# Patient Record
Sex: Female | Born: 1935 | Race: White | Hispanic: No | Smoking: Never smoker
Health system: Southern US, Community
[De-identification: ages and names within clinical notes are randomized; demographics above are authoritative.]

## PROBLEM LIST (undated history)

## (undated) DIAGNOSIS — E7800 Pure hypercholesterolemia, unspecified: Secondary | ICD-10-CM

## (undated) DIAGNOSIS — M1990 Unspecified osteoarthritis, unspecified site: Secondary | ICD-10-CM

## (undated) DIAGNOSIS — I4891 Unspecified atrial fibrillation: Secondary | ICD-10-CM

## (undated) DIAGNOSIS — D649 Anemia, unspecified: Secondary | ICD-10-CM

## (undated) DIAGNOSIS — I2510 Atherosclerotic heart disease of native coronary artery without angina pectoris: Secondary | ICD-10-CM

## (undated) DIAGNOSIS — L0390 Cellulitis, unspecified: Secondary | ICD-10-CM

## (undated) DIAGNOSIS — I890 Lymphedema, not elsewhere classified: Secondary | ICD-10-CM

## (undated) DIAGNOSIS — I872 Venous insufficiency (chronic) (peripheral): Secondary | ICD-10-CM

## (undated) DIAGNOSIS — E119 Type 2 diabetes mellitus without complications: Secondary | ICD-10-CM

## (undated) DIAGNOSIS — I10 Essential (primary) hypertension: Secondary | ICD-10-CM

## (undated) DIAGNOSIS — Z8673 Personal history of transient ischemic attack (TIA), and cerebral infarction without residual deficits: Secondary | ICD-10-CM

## (undated) DIAGNOSIS — C55 Malignant neoplasm of uterus, part unspecified: Secondary | ICD-10-CM

## (undated) DIAGNOSIS — I639 Cerebral infarction, unspecified: Secondary | ICD-10-CM

## (undated) HISTORY — DX: Cerebral infarction, unspecified: I63.9

## (undated) HISTORY — DX: Malignant neoplasm of uterus, part unspecified: C55

## (undated) HISTORY — DX: Unspecified atrial fibrillation: I48.91

## (undated) HISTORY — DX: Essential (primary) hypertension: I10

## (undated) HISTORY — DX: Anemia, unspecified: D64.9

## (undated) HISTORY — DX: Personal history of transient ischemic attack (TIA), and cerebral infarction without residual deficits: Z86.73

## (undated) HISTORY — DX: Type 2 diabetes mellitus without complications: E11.9

## (undated) HISTORY — DX: Cellulitis, unspecified: L03.90

## (undated) HISTORY — DX: Unspecified osteoarthritis, unspecified site: M19.90

## (undated) HISTORY — DX: Atherosclerotic heart disease of native coronary artery without angina pectoris: I25.10

## (undated) HISTORY — DX: Pure hypercholesterolemia, unspecified: E78.00

## (undated) HISTORY — PX: CATARACT EXTRACTION: SUR2

---

## 1967-02-17 HISTORY — PX: TOTAL ABDOMINAL HYSTERECTOMY W/ BILATERAL SALPINGOOPHORECTOMY: SHX83

## 1997-02-16 HISTORY — PX: CORONARY ARTERY BYPASS GRAFT: SHX141

## 1997-11-19 ENCOUNTER — Encounter: Payer: Self-pay | Admitting: Cardiothoracic Surgery

## 1997-11-19 ENCOUNTER — Inpatient Hospital Stay (HOSPITAL_COMMUNITY): Admission: RE | Admit: 1997-11-19 | Discharge: 1997-11-25 | Payer: Self-pay | Admitting: Cardiothoracic Surgery

## 1997-11-20 ENCOUNTER — Encounter: Payer: Self-pay | Admitting: Cardiothoracic Surgery

## 1997-11-21 ENCOUNTER — Encounter: Payer: Self-pay | Admitting: Cardiothoracic Surgery

## 1997-11-22 ENCOUNTER — Encounter: Payer: Self-pay | Admitting: Cardiothoracic Surgery

## 2003-03-22 ENCOUNTER — Other Ambulatory Visit: Payer: Self-pay

## 2004-09-15 ENCOUNTER — Ambulatory Visit: Payer: Self-pay | Admitting: Internal Medicine

## 2004-10-13 ENCOUNTER — Emergency Department: Payer: Self-pay | Admitting: Emergency Medicine

## 2004-10-13 ENCOUNTER — Other Ambulatory Visit: Payer: Self-pay

## 2006-10-14 ENCOUNTER — Ambulatory Visit: Payer: Self-pay | Admitting: Internal Medicine

## 2006-10-21 ENCOUNTER — Inpatient Hospital Stay: Payer: Self-pay | Admitting: Internal Medicine

## 2006-10-21 ENCOUNTER — Other Ambulatory Visit: Payer: Self-pay

## 2006-12-10 ENCOUNTER — Ambulatory Visit: Payer: Self-pay | Admitting: Internal Medicine

## 2007-05-15 ENCOUNTER — Other Ambulatory Visit: Payer: Self-pay

## 2007-05-16 ENCOUNTER — Inpatient Hospital Stay: Payer: Self-pay | Admitting: General Surgery

## 2007-05-16 ENCOUNTER — Ambulatory Visit: Payer: Self-pay | Admitting: Internal Medicine

## 2007-05-18 ENCOUNTER — Ambulatory Visit: Payer: Self-pay | Admitting: Internal Medicine

## 2007-05-21 ENCOUNTER — Ambulatory Visit: Payer: Self-pay | Admitting: Internal Medicine

## 2007-05-23 ENCOUNTER — Ambulatory Visit: Payer: Self-pay | Admitting: Internal Medicine

## 2007-05-25 ENCOUNTER — Ambulatory Visit: Payer: Self-pay | Admitting: Unknown Physician Specialty

## 2007-05-27 ENCOUNTER — Ambulatory Visit: Payer: Self-pay | Admitting: Internal Medicine

## 2007-05-30 ENCOUNTER — Ambulatory Visit: Payer: Self-pay | Admitting: Internal Medicine

## 2007-06-06 ENCOUNTER — Ambulatory Visit: Payer: Self-pay | Admitting: Internal Medicine

## 2007-06-13 ENCOUNTER — Ambulatory Visit: Payer: Self-pay | Admitting: Internal Medicine

## 2007-06-17 ENCOUNTER — Ambulatory Visit: Payer: Self-pay | Admitting: Internal Medicine

## 2007-06-27 ENCOUNTER — Ambulatory Visit: Payer: Self-pay | Admitting: Internal Medicine

## 2008-12-24 ENCOUNTER — Emergency Department: Payer: Self-pay | Admitting: Emergency Medicine

## 2011-07-07 ENCOUNTER — Ambulatory Visit: Payer: Self-pay | Admitting: Vascular Surgery

## 2011-07-07 LAB — CREATININE, SERUM
Creatinine: 0.66 mg/dL
EGFR (African American): >60
EGFR (Non-African Amer.): >60

## 2011-07-07 LAB — BUN: BUN: 14 mg/dL

## 2011-08-27 LAB — BASIC METABOLIC PANEL
Glucose: 121 mg/dL
Sodium: 132 mmol/L — AB (ref 137–147)

## 2011-08-27 LAB — TSH: TSH: 1.39 u[IU]/mL (ref 0.41–5.90)

## 2012-02-15 ENCOUNTER — Other Ambulatory Visit: Payer: Self-pay | Admitting: Internal Medicine

## 2012-02-15 MED ORDER — LISINOPRIL 40 MG PO TABS: 40.0000 mg | ORAL_TABLET | Freq: Every day | ORAL | Status: DC

## 2012-02-15 NOTE — Telephone Encounter (Signed)
Lisinopril 40 mg   Take 1 tablet one time daily  # 90

## 2012-02-15 NOTE — Telephone Encounter (Signed)
Sent in to pharmacy.  

## 2012-03-25 ENCOUNTER — Telehealth: Payer: Self-pay | Admitting: Internal Medicine

## 2012-03-25 NOTE — Telephone Encounter (Signed)
Patient called wanting labs done before her appointment on 3.18.14.

## 2012-03-25 NOTE — Telephone Encounter (Signed)
Patient already has lab appointment for 04/08/12

## 2012-03-28 NOTE — Telephone Encounter (Signed)
Called patient to let her know that when her labs is scheduled.

## 2012-04-07 ENCOUNTER — Telehealth: Payer: Self-pay | Admitting: *Deleted

## 2012-04-07 NOTE — Telephone Encounter (Signed)
Patient is coming in for labs tomorrow 2.21.2014 what labs and dx would you like? Thank you  

## 2012-04-08 ENCOUNTER — Other Ambulatory Visit (INDEPENDENT_AMBULATORY_CARE_PROVIDER_SITE_OTHER): Payer: Medicare Other

## 2012-04-08 ENCOUNTER — Other Ambulatory Visit: Payer: Self-pay | Admitting: Internal Medicine

## 2012-04-08 DIAGNOSIS — E7800 Pure hypercholesterolemia, unspecified: Secondary | ICD-10-CM

## 2012-04-08 DIAGNOSIS — I10 Essential (primary) hypertension: Secondary | ICD-10-CM

## 2012-04-08 LAB — COMPREHENSIVE METABOLIC PANEL
ALT: 25 U/L (ref 0–35)
AST: 32 U/L (ref 0–37)
Albumin: 4.4 g/dL (ref 3.5–5.2)
Alkaline Phosphatase: 76 U/L (ref 39–117)
BUN: 18 mg/dL (ref 6–23)
CO2: 31 mEq/L (ref 19–32)
Calcium: 9.8 mg/dL (ref 8.4–10.5)
Chloride: 93 mEq/L — ABNORMAL LOW (ref 96–112)
Creatinine, Ser: 0.6 mg/dL (ref 0.4–1.2)
GFR: 94.82 mL/min (ref >60.00)
Glucose, Bld: 138 mg/dL — ABNORMAL HIGH (ref 70–99)
Potassium: 4.1 mEq/L (ref 3.5–5.1)
Sodium: 132 mEq/L — ABNORMAL LOW (ref 135–145)
Total Bilirubin: 0.9 mg/dL (ref 0.3–1.2)
Total Protein: 7.2 g/dL (ref 6.0–8.3)

## 2012-04-08 LAB — LIPID PANEL
Cholesterol: 215 mg/dL — ABNORMAL HIGH (ref 0–200)
HDL: 104.7 mg/dL (ref >39.00)
Total CHOL/HDL Ratio: 2
Triglycerides: 84 mg/dL (ref 0.0–149.0)
VLDL: 16.8 mg/dL (ref 0.0–40.0)

## 2012-04-08 LAB — LDL CHOLESTEROL, DIRECT: Direct LDL: 82.4 mg/dL

## 2012-04-08 NOTE — Telephone Encounter (Signed)
I ordered a met c and liver panel.

## 2012-04-08 NOTE — Progress Notes (Signed)
Order placed for labs.

## 2012-04-11 ENCOUNTER — Other Ambulatory Visit: Payer: Self-pay | Admitting: Internal Medicine

## 2012-04-11 DIAGNOSIS — R5381 Other malaise: Secondary | ICD-10-CM

## 2012-04-11 DIAGNOSIS — E871 Hypo-osmolality and hyponatremia: Secondary | ICD-10-CM

## 2012-04-11 DIAGNOSIS — R739 Hyperglycemia, unspecified: Secondary | ICD-10-CM

## 2012-04-11 NOTE — Progress Notes (Signed)
Order placed for follow up labs 

## 2012-04-13 ENCOUNTER — Encounter: Payer: Self-pay | Admitting: *Deleted

## 2012-04-14 ENCOUNTER — Telehealth: Payer: Self-pay | Admitting: *Deleted

## 2012-04-14 NOTE — Telephone Encounter (Signed)
I would prefer earlier, but if this is the only day she can come - ok.   Will do her labs at her appt.

## 2012-04-14 NOTE — Telephone Encounter (Signed)
Patient called to see if she can have her labs done when she comes in for her appointment 3/18? Patient stated that is difficult for her to come in so she wants to comes in at the same time.

## 2012-04-14 NOTE — Telephone Encounter (Signed)
Pt aware she can get her labs done on 3/18 office visit

## 2012-04-28 ENCOUNTER — Encounter: Payer: Self-pay | Admitting: *Deleted

## 2012-05-03 ENCOUNTER — Ambulatory Visit: Payer: Self-pay | Admitting: Internal Medicine

## 2012-06-10 ENCOUNTER — Encounter: Payer: Self-pay | Admitting: Internal Medicine

## 2012-06-14 ENCOUNTER — Encounter: Payer: Self-pay | Admitting: Internal Medicine

## 2012-06-16 ENCOUNTER — Encounter: Payer: Self-pay | Admitting: Internal Medicine

## 2012-06-16 ENCOUNTER — Ambulatory Visit (INDEPENDENT_AMBULATORY_CARE_PROVIDER_SITE_OTHER): Payer: Medicare Other | Admitting: Internal Medicine

## 2012-06-16 VITALS — BP 142/80 | HR 83 | Temp 98.5°F | Ht 66.0 in | Wt 133.5 lb

## 2012-06-16 DIAGNOSIS — I10 Essential (primary) hypertension: Secondary | ICD-10-CM

## 2012-06-16 DIAGNOSIS — M79604 Pain in right leg: Secondary | ICD-10-CM

## 2012-06-16 DIAGNOSIS — F329 Major depressive disorder, single episode, unspecified: Secondary | ICD-10-CM

## 2012-06-16 DIAGNOSIS — R29898 Other symptoms and signs involving the musculoskeletal system: Secondary | ICD-10-CM

## 2012-06-16 DIAGNOSIS — I4891 Unspecified atrial fibrillation: Secondary | ICD-10-CM

## 2012-06-16 DIAGNOSIS — E119 Type 2 diabetes mellitus without complications: Secondary | ICD-10-CM

## 2012-06-16 DIAGNOSIS — I739 Peripheral vascular disease, unspecified: Secondary | ICD-10-CM

## 2012-06-16 DIAGNOSIS — E7800 Pure hypercholesterolemia, unspecified: Secondary | ICD-10-CM

## 2012-06-16 DIAGNOSIS — I2510 Atherosclerotic heart disease of native coronary artery without angina pectoris: Secondary | ICD-10-CM

## 2012-06-16 DIAGNOSIS — E871 Hypo-osmolality and hyponatremia: Secondary | ICD-10-CM

## 2012-06-16 DIAGNOSIS — M79609 Pain in unspecified limb: Secondary | ICD-10-CM

## 2012-06-16 LAB — BASIC METABOLIC PANEL
BUN: 17 mg/dL (ref 6–23)
CO2: 31 mEq/L (ref 19–32)
Calcium: 9.7 mg/dL (ref 8.4–10.5)
Chloride: 93 mEq/L — ABNORMAL LOW (ref 96–112)
Creatinine, Ser: 0.5 mg/dL (ref 0.4–1.2)
GFR: 118.27 mL/min (ref >60.00)
Glucose, Bld: 103 mg/dL — ABNORMAL HIGH (ref 70–99)
Potassium: 4.4 mEq/L (ref 3.5–5.1)
Sodium: 130 mEq/L — ABNORMAL LOW (ref 135–145)

## 2012-06-17 ENCOUNTER — Other Ambulatory Visit: Payer: Self-pay | Admitting: Internal Medicine

## 2012-06-17 ENCOUNTER — Encounter: Payer: Self-pay | Admitting: Internal Medicine

## 2012-06-17 DIAGNOSIS — E7800 Pure hypercholesterolemia, unspecified: Secondary | ICD-10-CM | POA: Insufficient documentation

## 2012-06-17 DIAGNOSIS — F32A Depression, unspecified: Secondary | ICD-10-CM | POA: Insufficient documentation

## 2012-06-17 DIAGNOSIS — I2510 Atherosclerotic heart disease of native coronary artery without angina pectoris: Secondary | ICD-10-CM | POA: Insufficient documentation

## 2012-06-17 DIAGNOSIS — I4891 Unspecified atrial fibrillation: Secondary | ICD-10-CM | POA: Insufficient documentation

## 2012-06-17 DIAGNOSIS — I739 Peripheral vascular disease, unspecified: Secondary | ICD-10-CM | POA: Insufficient documentation

## 2012-06-17 DIAGNOSIS — E119 Type 2 diabetes mellitus without complications: Secondary | ICD-10-CM | POA: Insufficient documentation

## 2012-06-17 DIAGNOSIS — E871 Hypo-osmolality and hyponatremia: Secondary | ICD-10-CM

## 2012-06-17 DIAGNOSIS — F329 Major depressive disorder, single episode, unspecified: Secondary | ICD-10-CM | POA: Insufficient documentation

## 2012-06-17 DIAGNOSIS — I10 Essential (primary) hypertension: Secondary | ICD-10-CM | POA: Insufficient documentation

## 2012-06-17 MED ORDER — HYDROCHLOROTHIAZIDE 12.5 MG PO TABS: 12.5000 mg | ORAL_TABLET | Freq: Every day | ORAL | Status: DC

## 2012-06-17 MED ORDER — SERTRALINE HCL 25 MG PO TABS: 25.0000 mg | ORAL_TABLET | Freq: Every day | ORAL | Status: DC

## 2012-06-17 MED ORDER — LISINOPRIL 40 MG PO TABS: 40.0000 mg | ORAL_TABLET | Freq: Every day | ORAL | Status: DC

## 2012-06-17 MED ORDER — METOPROLOL TARTRATE 25 MG PO TABS: ORAL_TABLET | ORAL | Status: DC

## 2012-06-17 NOTE — Progress Notes (Signed)
Orders placed for sodium recheck

## 2012-06-17 NOTE — Progress Notes (Signed)
Subjective:    Patient ID: [name], female    DOB: 05-28-1922, 79 y.o.   MRN: [id number]  HPI 79 year old female with past history of CAD s/p bypass in 10/99.  She also has a history of hypertension, hypercholesterolemia, atrial fibrillation and previous presumed TIA/CVA (2008).  Only on aspirin (per pt request).  She comes in today for a scheduled follow up.  She states she has been doing relatively well. She does report some increased hip soreness and leg soreness.  Walks with a walker.  Hard for her to get up from a seated position and getting harder for her to ambulate.  She also describes the burning/stinging sensation in her feet and lower legs.  Is s/p stent of lower extremity.  Was on plavix.  Off now.  Taking her medications regularly.  Blood pressure 150-160s/60-70s.  Breathing stable.  Bowels stable.    Past Medical History  Diagnosis Date  . Type II or unspecified type diabetes mellitus without mention of complication, not stated as uncontrolled   . Essential hypertension, benign   . Anemia, unspecified   . Atrial fibrillation   . Pure hypercholesterolemia   . Osteoarthritis   . History of TIAs   . CVA (cerebral infarction)     history  . Atrial fibrillation     history  . Uterine cancer     history    Outpatient Encounter Prescriptions as of 06/16/2012  Medication Sig Dispense Refill  . aspirin 325 MG EC tablet Take 325 mg by mouth daily.      Marland Kitchen glucose blood test strip 1 each by Other route as needed for other. Use one test strip bid      . hydrochlorothiazide (HYDRODIURIL) 12.5 MG tablet Take 12.5 mg by mouth daily.      Marland Kitchen lisinopril (PRINIVIL,ZESTRIL) 40 MG tablet Take 1 tablet (40 mg total) by mouth daily.  90 tablet  1  . metoprolol (LOPRESSOR) 50 MG tablet Take 50 mg by mouth 2 (two) times daily.      . sertraline (ZOLOFT) 25 MG tablet Take 25 mg by mouth daily.      Marland Kitchen nystatin cream (MYCOSTATIN) Apply topically 2 (two) times daily.       No  facility-administered encounter medications on file as of 06/16/2012.    Review of Systems Patient denies any headache, lightheadedness or dizziness.  No sinus or allergy symptoms.  No chest pain, tightness or palpitations.  No increased shortness of breath, cough or congestion.  No nausea or vomiting.  No abdominal pain or cramping.  No bowel change, such as diarrhea, constipation, BRBPR or melana.  No urine change.  Hip and leg pain as outlined.  Limiting her regarding her ambulation.      Objective:   Physical Exam Filed Vitals:   06/16/12 1108  BP: 142/80  Pulse: 83  Temp: 98.5 F (1.10 C)   79 year old female in no acute distress.   HEENT:  Nares- clear.  Oropharynx - without lesions. NECK:  Supple.  Nontender.  HEART:  Appears to be regular. LUNGS:  No crackles or wheezing audible.  Respirations even and unlabored.  RADIAL PULSE:  Equal bilaterally.   ABDOMEN:  Soft, nontender.  Bowel sounds present and normal.  No audible abdominal bruit.   EXTREMITIES:  Pedal edema present.  DP pulses palpable and equal bilaterally.      SKIN:  Some pedal erythema.  Appears to be more consistent with stasis changes. No evidence  of infection.  Some changes on her feet - c/w yeast.      Assessment & Plan:  LEG PAIN AND WEAKNESS.  Will have home health physical therapy evaluate and treat.  Will make a f/up appt with vascular surgery since s/p stent.  Discussed treatment of neuropathy as well.   DERM.  Will try nystatin cream on her feet.  Notify me if persistent.   HEALTH MAINTENANCE.  Last physical 06/30/11.  Has declined mammogram and further GI evaluation.  Declines bone density.   Pneumovax 12/20/06.

## 2012-06-19 ENCOUNTER — Encounter: Payer: Self-pay | Admitting: Internal Medicine

## 2012-06-19 DIAGNOSIS — E871 Hypo-osmolality and hyponatremia: Secondary | ICD-10-CM | POA: Insufficient documentation

## 2012-06-19 NOTE — Assessment & Plan Note (Signed)
Appears to be in SR today.  She declines coumadin.  On aspirin. Follow.

## 2012-06-19 NOTE — Assessment & Plan Note (Signed)
S/P stent placement.  Followed by vascular surgery.  Off Plavix.  Only on aspirin. With the leg pain, will have vascular surgery reevaluate.

## 2012-06-19 NOTE — Assessment & Plan Note (Signed)
Blood pressure elevated on my check.  Discussed adjusting her medications.  Her checks have been averaging 150-160s/60-70s.  She wants to continue to monitor.  Will send in readings over the next few weeks.  If remains elevated, will need further adjustment in her medication.

## 2012-06-19 NOTE — Assessment & Plan Note (Signed)
She declines cholesterol medication.  Check lipid panel. Low cholesterol diet.  Follow.

## 2012-06-19 NOTE — Assessment & Plan Note (Signed)
Has known disease.  Currently asymptomatic.   Feels she is doing well from a cardiac standpoint.  Follow.    

## 2012-06-19 NOTE — Assessment & Plan Note (Signed)
On zoloft and appears to be doing well.  Follow.   

## 2012-06-19 NOTE — Assessment & Plan Note (Signed)
Sodium low.  Recheck today.

## 2012-06-19 NOTE — Assessment & Plan Note (Signed)
Has controlled with diet.  Low carb diet.  Check metabolic panel and a1c.    

## 2012-06-28 ENCOUNTER — Encounter: Payer: Self-pay | Admitting: Internal Medicine

## 2012-07-07 ENCOUNTER — Telehealth: Payer: Self-pay | Admitting: *Deleted

## 2012-07-07 NOTE — Telephone Encounter (Signed)
[name] with [facility] called requesting an order for occupational therapy twice a week x 4 weeks (pt BP was 180/90)

## 2012-07-07 NOTE — Telephone Encounter (Signed)
Ok for order for occupational therapy.  Have ms Auxier relax and take bp again.  See if improved.  Gets very anxious.  She usually monitors her bp.

## 2012-07-08 NOTE — Telephone Encounter (Signed)
LMTCB for verbal order

## 2012-07-08 NOTE — Telephone Encounter (Signed)
Spoke with Amy & verbal order given

## 2012-07-26 ENCOUNTER — Encounter: Payer: Self-pay | Admitting: Internal Medicine

## 2012-07-26 ENCOUNTER — Ambulatory Visit (INDEPENDENT_AMBULATORY_CARE_PROVIDER_SITE_OTHER): Payer: Medicare Other | Admitting: Internal Medicine

## 2012-07-26 VITALS — BP 150/90 | HR 84 | Temp 98.6°F | Ht 66.0 in | Wt 134.0 lb

## 2012-07-26 DIAGNOSIS — I739 Peripheral vascular disease, unspecified: Secondary | ICD-10-CM

## 2012-07-26 DIAGNOSIS — I4891 Unspecified atrial fibrillation: Secondary | ICD-10-CM

## 2012-07-26 DIAGNOSIS — F329 Major depressive disorder, single episode, unspecified: Secondary | ICD-10-CM

## 2012-07-26 DIAGNOSIS — E871 Hypo-osmolality and hyponatremia: Secondary | ICD-10-CM

## 2012-07-26 DIAGNOSIS — R5383 Other fatigue: Secondary | ICD-10-CM

## 2012-07-26 DIAGNOSIS — I2510 Atherosclerotic heart disease of native coronary artery without angina pectoris: Secondary | ICD-10-CM

## 2012-07-26 DIAGNOSIS — I10 Essential (primary) hypertension: Secondary | ICD-10-CM

## 2012-07-26 DIAGNOSIS — R5381 Other malaise: Secondary | ICD-10-CM

## 2012-07-26 DIAGNOSIS — R7309 Other abnormal glucose: Secondary | ICD-10-CM

## 2012-07-26 DIAGNOSIS — R739 Hyperglycemia, unspecified: Secondary | ICD-10-CM

## 2012-07-26 DIAGNOSIS — E119 Type 2 diabetes mellitus without complications: Secondary | ICD-10-CM

## 2012-07-26 DIAGNOSIS — E7800 Pure hypercholesterolemia, unspecified: Secondary | ICD-10-CM

## 2012-07-27 ENCOUNTER — Telehealth: Payer: Self-pay | Admitting: *Deleted

## 2012-07-27 LAB — CBC WITH DIFFERENTIAL/PLATELET
Basophils Relative: 0.3 % (ref 0.0–3.0)
Hemoglobin: 13 g/dL (ref 12.0–15.0)
Lymphocytes Relative: 18.4 % (ref 12.0–46.0)
Monocytes Relative: 10.3 % (ref 3.0–12.0)
Neutro Abs: 6.8 10*3/uL (ref 1.4–7.7)
Neutrophils Relative %: 69.7 % (ref 43.0–77.0)
RBC: 4.32 Mil/uL (ref 3.87–5.11)
WBC: 9.8 10*3/uL (ref 4.5–10.5)

## 2012-07-27 LAB — HEMOGLOBIN A1C: Hgb A1c MFr Bld: 6.2 % (ref 4.6–6.5)

## 2012-07-27 LAB — TSH: TSH: 3.44 u[IU]/mL (ref 0.35–5.50)

## 2012-07-27 NOTE — Telephone Encounter (Signed)
Pt aware to d/c 25mg  of Metoprolol, & pt is currently on Lisinopril (I marked it as taking, but for reason it is not showing up on our notes).

## 2012-07-27 NOTE — Telephone Encounter (Signed)
Pt called to update medication list. Please review & let me know if you would like to make any changes

## 2012-07-27 NOTE — Telephone Encounter (Signed)
If this is what she is taking, then have her continue the metoprolol 50mg  bid.  Stop the 25mg  metoprolol.  She also needs to be on Lisinopril.  If she is not taking the lisinipril - needs to restart.  Follow blood pressure.

## 2012-07-28 ENCOUNTER — Encounter: Payer: Self-pay | Admitting: Internal Medicine

## 2012-07-28 ENCOUNTER — Telehealth: Payer: Self-pay | Admitting: Internal Medicine

## 2012-07-28 ENCOUNTER — Encounter: Payer: Self-pay | Admitting: *Deleted

## 2012-07-28 MED ORDER — AMLODIPINE BESYLATE 2.5 MG PO TABS: 2.5000 mg | ORAL_TABLET | Freq: Every day | ORAL | Status: DC

## 2012-07-28 NOTE — Assessment & Plan Note (Signed)
On zoloft and appears to be doing well.  Follow.   

## 2012-07-28 NOTE — Assessment & Plan Note (Signed)
Has known disease.  Currently asymptomatic.   Feels she is doing well from a cardiac standpoint.  Follow.    

## 2012-07-28 NOTE — Assessment & Plan Note (Signed)
She declines cholesterol medication.   Low cholesterol diet.  Follow.   

## 2012-07-28 NOTE — Progress Notes (Signed)
Subjective:    Patient ID: [name], female    DOB: 1922/04/05, 79 y.o.   MRN: [id number]  HPI 79 year old female with past history of CAD s/p bypass in 10/99.  She also has a history of hypertension, hypercholesterolemia, atrial fibrillation and previous presumed TIA/CVA (2008).  Only on aspirin (per pt request).  She comes in today for a scheduled follow up.  She states she has been doing relatively well.  Reports increased hip soreness and leg soreness.  Walks with a walker.  Hard for her to get up from a seated position and getting harder for her to ambulate.  Has been receiving home PT.  Has helped some.  Still with the pain.  Mainly her hip.  Desires no surgery or further intervention.  She also describes the burning/stinging sensation in her feet and lower legs.  Is s/p stent of lower extremity.  Was on plavix.  Off now.  Blood pressure 150-160s/60-70s.  Elevated.  Confused as to her current medication regimen.  Will need to clarify.  Breathing stable.  Bowels stable.    Past Medical History  Diagnosis Date  . Type II or unspecified type diabetes mellitus without mention of complication, not stated as uncontrolled   . Essential hypertension, benign   . Anemia, unspecified   . Atrial fibrillation   . Hypercholesterolemia   . Osteoarthritis   . History of TIAs   . CVA (cerebral infarction)   . Atrial fibrillation   . Uterine cancer     s/p TAH/BSO  . CAD (coronary artery disease)     s/p CABG 10/99 with LIMA to the LAD, SVG to OM1, SVG to distal RCA    Outpatient Encounter Prescriptions as of 07/26/2012  Medication Sig Dispense Refill  . glucose blood test strip 1 each by Other route as needed for other. Use one test strip bid      . lisinopril (PRINIVIL,ZESTRIL) 40 MG tablet Take 1 tablet (40 mg total) by mouth daily.  90 tablet  1  . nystatin cream (MYCOSTATIN) Apply topically 2 (two) times daily.      . sertraline (ZOLOFT) 25 MG tablet Take 1 tablet (25 mg total) by mouth  daily.  90 tablet  1  . [DISCONTINUED] aspirin 325 MG EC tablet Take 325 mg by mouth daily.      . [DISCONTINUED] hydrochlorothiazide (HYDRODIURIL) 12.5 MG tablet Take 1 tablet (12.5 mg total) by mouth daily.  90 tablet  1  . [DISCONTINUED] metoprolol tartrate (LOPRESSOR) 25 MG tablet 1/2 tablet bid  90 tablet  1   No facility-administered encounter medications on file as of 07/26/2012.    Review of Systems Patient denies any headache, lightheadedness or dizziness.  No sinus or allergy symptoms.  No chest pain, tightness or palpitations.  No increased shortness of breath, cough or congestion.  No nausea or vomiting.  No abdominal pain or cramping.  No bowel change, such as diarrhea, constipation, BRBPR or melana.  No urine change.  Hip and leg pain as outlined.  Limiting her regarding her ambulation.  Blood pressures elevated as outlined.  Request rx for a four wheeled walker.      Objective:   Physical Exam  Filed Vitals:   07/26/12 1616  BP: 150/90  Pulse: 84  Temp: 98.6 F (17 C)   79 year old female in no acute distress.   HEENT:  Nares- clear.  Oropharynx - without lesions. NECK:  Supple.  Nontender.  HEART:  Appears  to be regular. LUNGS:  No crackles or wheezing audible.  Respirations even and unlabored.  RADIAL PULSE:  Equal bilaterally.   ABDOMEN:  Soft, nontender.  Bowel sounds present and normal.  No audible abdominal bruit.   EXTREMITIES:  Pedal edema present.  DP pulses palpable and equal bilaterally.      SKIN:  Some pedal erythema.  Appears to be more consistent with stasis changes. No evidence of infection.  Some changes on her feet - c/w yeast.      Assessment & Plan:  LEG PAIN AND WEAKNESS.  Receiving home health physical therapy.  Some improvement.  Rx for walker.  Will continue home exercises.  She desires no further intervention.     DERM.  Will try lotrimin cream on her feet.  Notify me if persistent.   HEALTH MAINTENANCE.  Last physical 06/30/11.  Has  declined mammogram and further GI evaluation.  Declines bone density.   Pneumovax 12/20/06.

## 2012-07-28 NOTE — Telephone Encounter (Signed)
See lab results.  After reviewing lab results (given normal sodium) and after reviewing last phone message (taking lisinopril along with the metoprolol and HCTZ) - I need to clarify how much hctz she is taking.  Is she on 12.5mg  q day or 25 mg q day.  She had told me she was taking 12.5mg  q day.  Need to clarify her mg of hctz and if she is taking 1/2 tablet or a whole tablet.  If she is on 12.5mg  of hctz daily, I want her to increase to 25mg  q day of hctz.  Let me know.

## 2012-07-28 NOTE — Telephone Encounter (Signed)
Spoke to pt.  She states she feels fine.  Blood pressure last checked 170.  Taking her meds regularly.  Will start her on norvasc 2.5 mg q day.  Follow pressures.  Get her back in soon to reassess.  Sent rx to [facility] at pts request.

## 2012-07-28 NOTE — Assessment & Plan Note (Signed)
Has controlled with diet.  Low carb diet.  Check metabolic panel and a1c.

## 2012-07-28 NOTE — Telephone Encounter (Signed)
Schedule a follow up appt in 4-6 weeks ( ).  Thanks.

## 2012-07-28 NOTE — Telephone Encounter (Signed)
Pt is currently on Lisinopril 40mg  daily, Metoprolol 50mg  bid, & HCTZ 25mg . Pt also reports that her BP this afternoon was 192/96. When she checked it a few hours later, it came down to 170/88. Please advise

## 2012-07-28 NOTE — Assessment & Plan Note (Signed)
Recheck sodium today.  

## 2012-07-28 NOTE — Assessment & Plan Note (Signed)
S/P stent placement.  Followed by vascular surgery.  Off Plavix.  Only on aspirin.

## 2012-07-28 NOTE — Assessment & Plan Note (Signed)
Blood pressure elevated today.  I am confused as to her current medication regimen.  Unsure what she is taking.  She will call with update on meds.  Adjust meds accordingly.  Check metabolic panel.

## 2012-07-28 NOTE — Assessment & Plan Note (Signed)
Appears to be in SR today.  She declines coumadin.  On aspirin. Follow.  

## 2012-07-29 ENCOUNTER — Telehealth: Payer: Self-pay | Admitting: *Deleted

## 2012-07-29 NOTE — Telephone Encounter (Signed)
PT called to report that [name] BP was 170/100 and stays consistently high everytime she checks its. Pt told her that she has the Lisinopril 10mg to take-prn. PT advised pt to take it at her last visit when her BP was high. Pt also called to day to report that she was told to D/C the Metoprolol 25mg and when she checked her BP today it was 182/91. Please advise 

## 2012-07-29 NOTE — Telephone Encounter (Signed)
PT called to report that Ms. Sida BP was 170/100 and stays consistently high everytime she checks its. Pt told her that she has the Lisinopril 10mg  to take-prn. PT advised pt to take it at her last visit when her BP was high. Pt also called to day to report that she was told to D/C the Metoprolol 25mg  and when she checked her BP today it was 182/91. Please advise

## 2012-07-29 NOTE — Telephone Encounter (Signed)
Pt states that she has picked up the RX & PT has been informed also. Pt will keep a BP log and send it to [place] in about 2-3 weeks

## 2012-07-29 NOTE — Telephone Encounter (Signed)
I spoke to her last night about her blood pressure.  I have sent in a rx for amlodipine.  She should start this for her blood pressure.  Please make sure she starts and can let PT know.

## 2012-08-02 ENCOUNTER — Ambulatory Visit: Payer: Medicare Other | Admitting: Internal Medicine

## 2012-08-30 ENCOUNTER — Telehealth: Payer: Self-pay | Admitting: Internal Medicine

## 2012-08-30 MED ORDER — METOPROLOL TARTRATE 50 MG PO TABS: 50.0000 mg | ORAL_TABLET | Freq: Two times a day (BID) | ORAL | Status: DC

## 2012-08-30 MED ORDER — AMLODIPINE BESYLATE 2.5 MG PO TABS: 2.5000 mg | ORAL_TABLET | Freq: Every day | ORAL | Status: DC

## 2012-08-30 MED ORDER — HYDROCHLOROTHIAZIDE 25 MG PO TABS: 25.0000 mg | ORAL_TABLET | Freq: Every morning | ORAL | Status: DC

## 2012-08-30 NOTE — Telephone Encounter (Signed)
Refilled hctz 25mg  #90 with 3 refills, metoprolol 50mg  #180 with 3 refills and amlodipine 2.5mg  #90 with 3 refills.

## 2012-08-31 ENCOUNTER — Telehealth: Payer: Self-pay | Admitting: *Deleted

## 2012-08-31 ENCOUNTER — Other Ambulatory Visit: Payer: Self-pay | Admitting: *Deleted

## 2012-08-31 NOTE — Telephone Encounter (Signed)
Pt notified that Dr. Lorin Picket have reviewed her BP readings & would like her to stay on the Amlodipine 2.5mg  daily. 90 day supply was sent to [facility] along with the Metoprolol & HCTZ.   BP readings were as follows: 6/14-170/69 (68) @ 7am, 142/66 (71) @ 10:15am, 137/62 (75) @12 :10pm, 157/74 (69) @ 5:30pm 6/16-150/70 (67) 8:10am, 133/63 (54) @ 1:09pm, 159/68 (72) @ 5:12pm 6/17-149/53 (71) @7 :23am, 138/58 (63) @ 10:13am 6/19-148/64 (75)@7 :02am, 122/52 (72)@ 11:45am, 161/60 (68) @ 6pm 6/20-164/63 (70)@ 6:50am, 154/64 (71) @ 1:42pm, 161/70 (69) @ 5:35pm 6/21-148/59 (69) @ 6:09am, 129/56 (67) @ 10:45am, 134/57 @1pm , 152/55@6 :37pm 6/23-156/67@7 :13am, 139/63@11 :40am, 148/59@1 :55pm, 164/64@5 :38pm 6/25-168/56@7am , 130/56@9 :22am, 155/58@1pm , 158/73@4pm  6/26-165/58@7am ,140/59@11 :24am,151/61@2 :46pm, 177/78@4 :53pm.

## 2012-09-06 ENCOUNTER — Ambulatory Visit (INDEPENDENT_AMBULATORY_CARE_PROVIDER_SITE_OTHER): Payer: Medicare Other | Admitting: Internal Medicine

## 2012-09-06 ENCOUNTER — Encounter: Payer: Self-pay | Admitting: Internal Medicine

## 2012-09-06 VITALS — BP 150/90 | HR 93 | Temp 98.6°F | Ht 66.0 in | Wt 136.8 lb

## 2012-09-06 DIAGNOSIS — E7800 Pure hypercholesterolemia, unspecified: Secondary | ICD-10-CM

## 2012-09-06 DIAGNOSIS — E119 Type 2 diabetes mellitus without complications: Secondary | ICD-10-CM

## 2012-09-06 DIAGNOSIS — I4891 Unspecified atrial fibrillation: Secondary | ICD-10-CM

## 2012-09-06 DIAGNOSIS — F329 Major depressive disorder, single episode, unspecified: Secondary | ICD-10-CM

## 2012-09-06 DIAGNOSIS — I10 Essential (primary) hypertension: Secondary | ICD-10-CM

## 2012-09-06 DIAGNOSIS — I2510 Atherosclerotic heart disease of native coronary artery without angina pectoris: Secondary | ICD-10-CM

## 2012-09-07 ENCOUNTER — Telehealth: Payer: Self-pay | Admitting: Internal Medicine

## 2012-09-07 ENCOUNTER — Encounter: Payer: Self-pay | Admitting: Internal Medicine

## 2012-09-07 MED ORDER — LISINOPRIL 40 MG PO TABS: 40.0000 mg | ORAL_TABLET | Freq: Every day | ORAL | Status: DC

## 2012-09-07 MED ORDER — AMLODIPINE BESYLATE 5 MG PO TABS: 5.0000 mg | ORAL_TABLET | Freq: Every day | ORAL | Status: DC

## 2012-09-07 MED ORDER — SERTRALINE HCL 25 MG PO TABS: 25.0000 mg | ORAL_TABLET | Freq: Every day | ORAL | Status: DC

## 2012-09-07 NOTE — Telephone Encounter (Signed)
Needs a f/u appt in 3 months ( ).  Needs fasting labs 1-2 days before 3 month f/u.

## 2012-09-07 NOTE — Assessment & Plan Note (Signed)
Blood pressure elevated today.  On norvasc 2.5mg  q day.  Will increase norvasc to 5mg  q day.  Follow  metabolic panel and pressure.

## 2012-09-07 NOTE — Assessment & Plan Note (Signed)
Has known disease.  Currently asymptomatic.   Feels she is doing well from a cardiac standpoint.  Follow.    

## 2012-09-07 NOTE — Assessment & Plan Note (Signed)
Has controlled with diet.  Low carb diet.  Follow metabolic panel and a1c.   

## 2012-09-07 NOTE — Progress Notes (Signed)
Subjective:    Patient ID: [name], female    DOB: 09-May-1922, 79 y.o.   MRN: [id number]  HPI 79 year old female with past history of CAD s/p bypass in 10/99.  She also has a history of hypertension, hypercholesterolemia, atrial fibrillation and previous presumed TIA/CVA (2008).  Only on aspirin (per pt request).  She comes in today for a scheduled follow up.  She states she has been doing relatively well.  Reports increased hip soreness and leg soreness.  Walks with a walker.    Desires no surgery or further intervention.  She also describes the burning/stinging sensation in her feet and lower legs.  Is s/p stent of lower extremity.  Was on plavix.  Off now.  Blood pressure 150-160s/60-70s.  Elevated.   Breathing stable.  Bowels stable.  Some lower extremity swelling.     Past Medical History  Diagnosis Date  . Type II or unspecified type diabetes mellitus without mention of complication, not stated as uncontrolled   . Essential hypertension, benign   . Anemia, unspecified   . Atrial fibrillation   . Hypercholesterolemia   . Osteoarthritis   . History of TIAs   . CVA (cerebral infarction)   . Atrial fibrillation   . Uterine cancer     s/p TAH/BSO  . CAD (coronary artery disease)     s/p CABG 10/99 with LIMA to the LAD, SVG to OM1, SVG to distal RCA    Outpatient Encounter Prescriptions as of 09/06/2012  Medication Sig Dispense Refill  . aspirin 325 MG EC tablet Take 325 mg by mouth daily.      Marland Kitchen glucose blood test strip 1 each by Other route as needed for other. Use one test strip bid      . hydrochlorothiazide (HYDRODIURIL) 25 MG tablet Take 1 tablet (25 mg total) by mouth every morning.  90 tablet  3  . lisinopril (PRINIVIL,ZESTRIL) 40 MG tablet Take 1 tablet (40 mg total) by mouth daily.  90 tablet  3  . metoprolol (LOPRESSOR) 50 MG tablet Take 1 tablet (50 mg total) by mouth 2 (two) times daily.  180 tablet  3  . sertraline (ZOLOFT) 25 MG tablet Take 1 tablet (25 mg total)  by mouth daily.  90 tablet  1  . [DISCONTINUED] amLODipine (NORVASC) 2.5 MG tablet Take 1 tablet (2.5 mg total) by mouth daily.  90 tablet  3  . [DISCONTINUED] lisinopril (PRINIVIL,ZESTRIL) 40 MG tablet Take 1 tablet (40 mg total) by mouth daily.  90 tablet  1  . [DISCONTINUED] sertraline (ZOLOFT) 25 MG tablet Take 1 tablet (25 mg total) by mouth daily.  90 tablet  1  . amLODipine (NORVASC) 5 MG tablet Take 1 tablet (5 mg total) by mouth daily.  90 tablet  3  . [DISCONTINUED] aspirin 81 MG tablet Take 81 mg by mouth 2 (two) times daily.      . [DISCONTINUED] nystatin cream (MYCOSTATIN) Apply topically 2 (two) times daily.       No facility-administered encounter medications on file as of 09/06/2012.    Review of Systems Patient denies any headache, lightheadedness or dizziness.  No sinus or allergy symptoms.  No chest pain, tightness or palpitations.  No increased shortness of breath, cough or congestion.  No nausea or vomiting.  No abdominal pain or cramping.  No bowel change, such as diarrhea, constipation, BRBPR or melana.  No urine change.  Hip and leg pain as outlined.  Limiting her regarding her ambulation.  Blood pressures elevated as outlined.       Objective:   Physical Exam  Filed Vitals:   09/06/12 1611  BP: 150/90  Pulse: 93  Temp: 98.6 F (55 C)   79 year old female in no acute distress.   HEENT:  Nares- clear.  Oropharynx - without lesions. NECK:  Supple.  Nontender.  HEART:  Appears to be regular. LUNGS:  No crackles or wheezing audible.  Respirations even and unlabored.  RADIAL PULSE:  Equal bilaterally.   ABDOMEN:  Soft, nontender.  Bowel sounds present and normal.  No audible abdominal bruit.   EXTREMITIES:  Pedal edema present.  DP pulses palpable and equal bilaterally.      Assessment & Plan:  LEG PAIN AND WEAKNESS.  Received home health physical therapy.  Some improvement.  Rx for walker.  Will continue home exercises.  She desires no further  intervention.  HEALTH MAINTENANCE.  Last physical 06/30/11.  Has declined mammogram and further GI evaluation.  Declines bone density.   Need to schedule physical.

## 2012-09-07 NOTE — Assessment & Plan Note (Signed)
On zoloft and appears to be doing well.  Follow.   

## 2012-09-07 NOTE — Assessment & Plan Note (Signed)
She declines coumadin.  On aspirin. Follow.  Declines any further evaluation or treatment.

## 2012-09-08 NOTE — Telephone Encounter (Signed)
Tried calling pt  No answer 7/24

## 2012-09-12 ENCOUNTER — Other Ambulatory Visit: Payer: Self-pay | Admitting: *Deleted

## 2012-09-12 MED ORDER — AMLODIPINE BESYLATE 5 MG PO TABS: 5.0000 mg | ORAL_TABLET | Freq: Every day | ORAL | Status: DC

## 2012-09-12 NOTE — Telephone Encounter (Signed)
Pt stated she needed to talk to her daughter before she could make her appointment.  Pt stated she would call back to schedule

## 2012-09-21 ENCOUNTER — Other Ambulatory Visit: Payer: Self-pay | Admitting: *Deleted

## 2012-09-21 NOTE — Telephone Encounter (Signed)
error 

## 2012-09-28 ENCOUNTER — Encounter: Payer: Self-pay | Admitting: *Deleted

## 2012-10-07 ENCOUNTER — Telehealth: Payer: Self-pay | Admitting: Internal Medicine

## 2012-10-07 ENCOUNTER — Other Ambulatory Visit: Payer: Self-pay | Admitting: *Deleted

## 2012-10-07 MED ORDER — AMLODIPINE BESYLATE 5 MG PO TABS: 5.0000 mg | ORAL_TABLET | Freq: Every day | ORAL | Status: DC

## 2012-10-07 NOTE — Telephone Encounter (Signed)
Done (second time-sent on 7/28 also)

## 2012-10-07 NOTE — Telephone Encounter (Signed)
amLODipine (NORVASC) 5 MG tablet ° °

## 2012-12-06 ENCOUNTER — Other Ambulatory Visit (INDEPENDENT_AMBULATORY_CARE_PROVIDER_SITE_OTHER): Payer: Medicare Other

## 2012-12-06 DIAGNOSIS — E7800 Pure hypercholesterolemia, unspecified: Secondary | ICD-10-CM

## 2012-12-06 DIAGNOSIS — E119 Type 2 diabetes mellitus without complications: Secondary | ICD-10-CM

## 2012-12-06 DIAGNOSIS — E871 Hypo-osmolality and hyponatremia: Secondary | ICD-10-CM

## 2012-12-06 DIAGNOSIS — I10 Essential (primary) hypertension: Secondary | ICD-10-CM

## 2012-12-06 LAB — BASIC METABOLIC PANEL
BUN: 15 mg/dL (ref 6–23)
Creatinine, Ser: 0.5 mg/dL (ref 0.4–1.2)
GFR: 118.14 mL/min (ref >60.00)
Glucose, Bld: 119 mg/dL — ABNORMAL HIGH (ref 70–99)

## 2012-12-06 LAB — SODIUM: Sodium: 133 mEq/L — ABNORMAL LOW (ref 135–145)

## 2012-12-06 LAB — HEPATIC FUNCTION PANEL
ALT: 20 U/L (ref 0–35)
Albumin: 4.2 g/dL (ref 3.5–5.2)
Total Bilirubin: 0.9 mg/dL (ref 0.3–1.2)

## 2012-12-06 LAB — LIPID PANEL
Cholesterol: 206 mg/dL — ABNORMAL HIGH (ref 0–200)
HDL: 94.8 mg/dL (ref >39.00)
VLDL: 14.2 mg/dL (ref 0.0–40.0)

## 2012-12-06 LAB — HEMOGLOBIN A1C: Hgb A1c MFr Bld: 6.5 % (ref 4.6–6.5)

## 2012-12-06 LAB — LDL CHOLESTEROL, DIRECT: Direct LDL: 97.8 mg/dL

## 2012-12-14 ENCOUNTER — Other Ambulatory Visit: Payer: Self-pay | Admitting: *Deleted

## 2012-12-14 MED ORDER — AMLODIPINE BESYLATE 5 MG PO TABS: 5.0000 mg | ORAL_TABLET | Freq: Every day | ORAL | Status: DC

## 2012-12-15 ENCOUNTER — Ambulatory Visit (INDEPENDENT_AMBULATORY_CARE_PROVIDER_SITE_OTHER): Payer: Medicare Other | Admitting: Internal Medicine

## 2012-12-15 ENCOUNTER — Encounter: Payer: Self-pay | Admitting: Internal Medicine

## 2012-12-15 VITALS — BP 140/80 | HR 103 | Temp 97.8°F | Ht 66.0 in | Wt 139.0 lb

## 2012-12-15 DIAGNOSIS — I4891 Unspecified atrial fibrillation: Secondary | ICD-10-CM

## 2012-12-15 DIAGNOSIS — E119 Type 2 diabetes mellitus without complications: Secondary | ICD-10-CM

## 2012-12-15 DIAGNOSIS — F32A Depression, unspecified: Secondary | ICD-10-CM

## 2012-12-15 DIAGNOSIS — E871 Hypo-osmolality and hyponatremia: Secondary | ICD-10-CM

## 2012-12-15 DIAGNOSIS — I739 Peripheral vascular disease, unspecified: Secondary | ICD-10-CM

## 2012-12-15 DIAGNOSIS — F329 Major depressive disorder, single episode, unspecified: Secondary | ICD-10-CM

## 2012-12-15 DIAGNOSIS — E7800 Pure hypercholesterolemia, unspecified: Secondary | ICD-10-CM

## 2012-12-15 DIAGNOSIS — G629 Polyneuropathy, unspecified: Secondary | ICD-10-CM

## 2012-12-15 DIAGNOSIS — I2510 Atherosclerotic heart disease of native coronary artery without angina pectoris: Secondary | ICD-10-CM

## 2012-12-15 DIAGNOSIS — Z23 Encounter for immunization: Secondary | ICD-10-CM

## 2012-12-15 DIAGNOSIS — I10 Essential (primary) hypertension: Secondary | ICD-10-CM

## 2012-12-15 DIAGNOSIS — G589 Mononeuropathy, unspecified: Secondary | ICD-10-CM

## 2012-12-15 MED ORDER — GABAPENTIN 100 MG PO CAPS: ORAL_CAPSULE | ORAL | Status: DC

## 2012-12-18 ENCOUNTER — Encounter: Payer: Self-pay | Admitting: Internal Medicine

## 2012-12-18 DIAGNOSIS — G629 Polyneuropathy, unspecified: Secondary | ICD-10-CM | POA: Insufficient documentation

## 2012-12-18 NOTE — Assessment & Plan Note (Signed)
Has known disease.  Currently asymptomatic.   Feels she is doing well from a cardiac standpoint.  Follow.    

## 2012-12-18 NOTE — Progress Notes (Signed)
Subjective:    Patient ID: [name], female    DOB: 12-19-1922, 79 y.o.   MRN: [id number]  HPI 79 year old female with past history of CAD s/p bypass in 10/99.  She also has a history of hypertension, hypercholesterolemia, atrial fibrillation and previous presumed TIA/CVA (2008).  Only on aspirin (per pt request).  She comes in today for a scheduled follow up.  She states she has been doing relatively well.  Reports increased hip soreness and leg soreness.  Walks with a walker.   Desires no surgery or further intervention.  She also describes the burning/stinging sensation in her feet and lower legs.  Is s/p stent of lower extremity.  Was on plavix.  Off now.  Blood pressure better.  Blood pressures averaging 130-150s/60-70s.   Breathing stable.  Bowels stable.  Some lower extremity swelling.  Has improved.     Past Medical History  Diagnosis Date  . Type II or unspecified type diabetes mellitus without mention of complication, not stated as uncontrolled   . Essential hypertension, benign   . Anemia, unspecified   . Atrial fibrillation   . Hypercholesterolemia   . Osteoarthritis   . History of TIAs   . CVA (cerebral infarction)   . Atrial fibrillation   . Uterine cancer     s/p TAH/BSO  . CAD (coronary artery disease)     s/p CABG 10/99 with LIMA to the LAD, SVG to OM1, SVG to distal RCA    Outpatient Encounter Prescriptions as of 12/15/2012  Medication Sig  . amLODipine (NORVASC) 5 MG tablet Take 1 tablet (5 mg total) by mouth daily.  Marland Kitchen aspirin 325 MG EC tablet Take 325 mg by mouth daily.  Marland Kitchen glucose blood test strip 1 each by Other route as needed for other. Use one test strip bid  . hydrochlorothiazide (HYDRODIURIL) 25 MG tablet Take 1 tablet (25 mg total) by mouth every morning.  Marland Kitchen lisinopril (PRINIVIL,ZESTRIL) 40 MG tablet Take 1 tablet (40 mg total) by mouth daily.  . metoprolol (LOPRESSOR) 50 MG tablet Take 1 tablet (50 mg total) by mouth 2 (two) times daily.  . sertraline  (ZOLOFT) 25 MG tablet Take 1 tablet (25 mg total) by mouth daily.  Marland Kitchen gabapentin (NEURONTIN) 100 MG capsule Take one tablet before bed    Review of Systems Patient denies any headache, lightheadedness or dizziness.  No sinus or allergy symptoms.  No chest pain, tightness or palpitations.  No increased shortness of breath, cough or congestion.  No nausea or vomiting.  No abdominal pain or cramping.  No bowel change, such as diarrhea, constipation, BRBPR or melana.  No urine change.  Hip and leg pain as outlined.  Limiting her regarding her ambulation.  Blood pressures improved.  See her attached list.  Still with some burning in her feet and legs.         Objective:   Physical Exam  Filed Vitals:   12/15/12 1627  BP: 140/80  Pulse: 103  Temp: 97.8 F (60.109 C)   79 year old female in no acute distress.   HEENT:  Nares- clear.  Oropharynx - without lesions. NECK:  Supple.  Nontender.  HEART:  Appears to be regular. LUNGS:  No crackles or wheezing audible.  Respirations even and unlabored.  RADIAL PULSE:  Equal bilaterally.   ABDOMEN:  Soft, nontender.  Bowel sounds present and normal.  No audible abdominal bruit.   EXTREMITIES:  Pedal edema present.  Improved.  Assessment & Plan:  LEG PAIN AND WEAKNESS.  Received home health physical therapy.  Some improvement.  Has a walker.  Will continue home exercises.  She desires no further intervention.  HEALTH MAINTENANCE.  Last physical 06/30/11.  Has declined mammogram and further GI evaluation.  Declines bone density.   Need to schedule physical.

## 2012-12-18 NOTE — Assessment & Plan Note (Signed)
Follow sodium level.  Has been stable.   

## 2012-12-18 NOTE — Assessment & Plan Note (Signed)
Has controlled with diet.  Low carb diet.  Follow metabolic panel and a1c.   

## 2012-12-18 NOTE — Assessment & Plan Note (Signed)
On zoloft and appears to be doing well.  Follow.   

## 2012-12-18 NOTE — Assessment & Plan Note (Signed)
Blood pressure better after increasing norvasc to 5mg  q day.  Follow  metabolic panel and pressure.

## 2012-12-18 NOTE — Assessment & Plan Note (Signed)
Describes the burning in her feet and legs as outlined.  Will try gabapentin 100mg  before bed.  Titrate up as tolerated.  Discussed with her regarding possible side effects of the medication.  Follow.

## 2012-12-18 NOTE — Assessment & Plan Note (Signed)
She declines cholesterol medication.   Low cholesterol diet.  Follow.   

## 2012-12-18 NOTE — Assessment & Plan Note (Signed)
S/P stent placement.  Followed by vascular surgery.  Off Plavix.  Only on aspirin.

## 2012-12-18 NOTE — Assessment & Plan Note (Signed)
She declines coumadin.  On aspirin. Follow.  Declines any further evaluation or treatment.

## 2013-01-02 LAB — URINALYSIS, COMPLETE
Bacteria: NONE SEEN
Bilirubin,UR: NEGATIVE
Blood: NEGATIVE
Hyaline Cast: 3
Ketone: NEGATIVE
Nitrite: NEGATIVE
Protein: NEGATIVE
Specific Gravity: 1.017 (ref 1.003–1.030)
Squamous Epithelial: NONE SEEN
WBC UR: <1 /HPF (ref 0–5)

## 2013-01-02 LAB — COMPREHENSIVE METABOLIC PANEL
Alkaline Phosphatase: 107 U/L (ref 50–136)
Anion Gap: 8 (ref 7–16)
Bilirubin,Total: 0.6 mg/dL (ref 0.2–1.0)
Creatinine: 0.6 mg/dL (ref 0.60–1.30)
Glucose: 143 mg/dL — ABNORMAL HIGH (ref 65–99)
Potassium: 4.3 mmol/L (ref 3.5–5.1)
SGPT (ALT): 29 U/L (ref 12–78)
Total Protein: 7.3 g/dL (ref 6.4–8.2)

## 2013-01-02 LAB — CBC
HGB: 12.1 g/dL (ref 12.0–16.0)
MCH: 28.8 pg (ref 26.0–34.0)
MCV: 87 fL (ref 80–100)
RBC: 4.2 10*6/uL (ref 3.80–5.20)
RDW: 14.1 % (ref 11.5–14.5)
WBC: 10 10*3/uL (ref 3.6–11.0)

## 2013-01-02 LAB — PROTIME-INR: Prothrombin Time: 13 secs (ref 11.5–14.7)

## 2013-01-03 ENCOUNTER — Inpatient Hospital Stay: Payer: Self-pay | Admitting: Internal Medicine

## 2013-01-03 LAB — LIPID PANEL
Cholesterol: 182 mg/dL (ref 0–200)
Ldl Cholesterol, Calc: 75 mg/dL (ref 0–100)
Triglycerides: 66 mg/dL (ref 0–200)
VLDL Cholesterol, Calc: 13 mg/dL (ref 5–40)

## 2013-01-03 LAB — CBC WITH DIFFERENTIAL/PLATELET
Basophil %: 0.5 %
Eosinophil #: 0.1 10*3/uL (ref 0.0–0.7)
Eosinophil %: 1.1 %
HCT: 34.7 % — ABNORMAL LOW (ref 35.0–47.0)
HGB: 11.9 g/dL — ABNORMAL LOW (ref 12.0–16.0)
Lymphocyte #: 1.3 10*3/uL (ref 1.0–3.6)
MCH: 29.3 pg (ref 26.0–34.0)
MCV: 86 fL (ref 80–100)
Monocyte #: 1 x10 3/mm — ABNORMAL HIGH (ref 0.2–0.9)
Neutrophil %: 72.5 %
RBC: 4.05 10*6/uL (ref 3.80–5.20)
WBC: 9 10*3/uL (ref 3.6–11.0)

## 2013-01-03 LAB — BASIC METABOLIC PANEL
Chloride: 98 mmol/L (ref 98–107)
Co2: 29 mmol/L (ref 21–32)
EGFR (African American): >60
EGFR (Non-African Amer.): >60
Glucose: 114 mg/dL — ABNORMAL HIGH (ref 65–99)
Osmolality: 267 (ref 275–301)
Potassium: 3.7 mmol/L (ref 3.5–5.1)

## 2013-01-03 LAB — TSH: Thyroid Stimulating Horm: 4.25 u[IU]/mL

## 2013-01-04 ENCOUNTER — Telehealth: Payer: Self-pay | Admitting: Internal Medicine

## 2013-01-04 LAB — BASIC METABOLIC PANEL
BUN: 12 mg/dL (ref 7–18)
Calcium, Total: 8.8 mg/dL (ref 8.5–10.1)
Co2: 28 mmol/L (ref 21–32)
Creatinine: 0.62 mg/dL (ref 0.60–1.30)
EGFR (Non-African Amer.): >60
Osmolality: 272 (ref 275–301)
Potassium: 3.6 mmol/L (ref 3.5–5.1)

## 2013-01-04 NOTE — Telephone Encounter (Signed)
She can stop the gabapentin and go back on tylenol.  Let us know if any problems.

## 2013-01-04 NOTE — Telephone Encounter (Signed)
Daughter notified 

## 2013-01-04 NOTE — Telephone Encounter (Signed)
[facility] is calling and wanting pt to come in, in 4 weeks for hospital f/u but there are no spots open for that ???

## 2013-01-04 NOTE — Telephone Encounter (Signed)
Please call pt and confirm doing ok.  Need hospital records.  I can see her on 02/03/13 at 11:45 for hospital f/u.

## 2013-01-04 NOTE — Telephone Encounter (Signed)
Pt is doing fairly well, came home today. She was placed on 5mg  of Eloquis BID, Atorvastatin 20mg  QD, Metoprolol 25mg  BID, & Amlodipine 2.5mg  QD. Her son states that the Gabapentin makes her really sleepy (too strong). Wants to know if she can go back on her Tylenol 500mg . Please advise (Records requested)

## 2013-01-05 ENCOUNTER — Telehealth: Payer: Self-pay | Admitting: Internal Medicine

## 2013-01-05 ENCOUNTER — Telehealth: Payer: Self-pay

## 2013-01-05 NOTE — Telephone Encounter (Signed)
The patient's daughter called stating that her brother was in town and he was willing to install all of her DME equipment if a prescription could be written to day for a transfer bench ,bed side commode with arms , raised toilet seat with arms and a  motorized wheel chair .

## 2013-01-05 NOTE — Telephone Encounter (Signed)
I can write a prescription for bedside commode.  Regarding hospice, I need more information (secondary to her not meeting requirements the last visit here).  I know she had a stroke recently and was hospitalized.  Need specifics from him- so can see if can get her qualified for hospice.

## 2013-01-05 NOTE — Telephone Encounter (Signed)
Called and spoke to pts daughter Kriste Basque.  Son was there as well.  Discussed Ms Manalo current situation with her.  Explained the process to get home health and equipment for the house.  Discussed options including me calling home health to get physical therapy, nursing aid and nursing care for her.  She informed me that they just found out the hospital has arranged home health and that they should be starting tomorrow.  She was informed that we sent the rx for the bedside commode to medical supply store.  She is not aware of anything else needed at this time.  Will let me know if any problems or if need anything.  She reported her and her brother were comfortable with this plan.

## 2013-01-05 NOTE — Telephone Encounter (Signed)
Patient contacted regarding discharge from [facility] on 01/04/13.  No answer, no machine.

## 2013-01-05 NOTE — Telephone Encounter (Signed)
Pt recently had stroke.  Son, Ramon Dredge came in clinic to see about having pt put on Hospice.  States pt needs beside toilet as her walker will not fit through doorway to get to bathroom.  Alexsys Eskin (son) [phone] is here from out of town and would like go get these things set up urgently.  Please contact.  States he will be leaving town [employer] account [employer].  Copy of discharge instructions and med list made and placed in Dr. Roby Lofts box.

## 2013-01-05 NOTE — Telephone Encounter (Signed)
Please advise 

## 2013-01-05 NOTE — Telephone Encounter (Signed)
Spoke with patients daughter, she was concerned no one has called them about the equip. Please advise.

## 2013-01-05 NOTE — Telephone Encounter (Signed)
Son called back (he was not aware that I left a message on [employer]). He is aware that I am faxing the Rx for the commode to [facility]. He also mentioned that he went to the hospital & they told him that we could order home healthcare & they could get started as early as tomorrow. I informed his that they usually require a face to face encounter first. He became very upset and reminded me that she just had a stroke & could barely even walk. He didn't understand why we had to be so difficult. I informed him that I would send you the message & get back with them on the next step.

## 2013-01-05 NOTE — Telephone Encounter (Signed)
LMTCB

## 2013-01-05 NOTE — Telephone Encounter (Signed)
Pt's son came by the office and stated that he went over to [employer] and with her insurance they will cover a bed side toilet they just need an order sent over to them. [facility] phone number is [phone] and fax number is [phone].

## 2013-01-06 ENCOUNTER — Telehealth: Payer: Self-pay | Admitting: Internal Medicine

## 2013-01-06 NOTE — Telephone Encounter (Signed)
Pt.notified

## 2013-01-06 NOTE — Telephone Encounter (Signed)
Called again regarding medication for sleep.

## 2013-01-06 NOTE — Telephone Encounter (Signed)
The patient is not sleeping at night and she wants something called into the pharmacy for sleep.

## 2013-01-06 NOTE — Telephone Encounter (Signed)
Please advise 

## 2013-01-06 NOTE — Telephone Encounter (Signed)
She just had a stroke.  I would hold on sleeping pills.  She can take some tylenol before bed.

## 2013-01-06 NOTE — Telephone Encounter (Signed)
2nd attempt to contact pt regarding discharge from [facility] on 01/04/13.   "Number has been disconnected."

## 2013-01-10 ENCOUNTER — Encounter: Payer: Medicare Other | Admitting: Cardiovascular Disease

## 2013-01-16 NOTE — Telephone Encounter (Signed)
Pt got out of hospital and is in a lot of pain. Pt's daughter would like a call back to see if they could move up her appointment before the 10th of December ??? Please call Tasia Catchings [phone].

## 2013-01-17 ENCOUNTER — Encounter: Payer: Medicare Other | Admitting: Cardiovascular Disease

## 2013-01-17 NOTE — Telephone Encounter (Signed)
I am ok if she takes magnesium.  If needs rx then magoxide 400mg  q day.  Let me know if any problems.

## 2013-01-17 NOTE — Telephone Encounter (Signed)
I spoke with patient & she wants to know if she can take the Magnesium for her cramps (see message below)-pt states that her pain is mainly from her legs & associated with cramps. Please advise

## 2013-01-17 NOTE — Telephone Encounter (Signed)
Pt notified & she already has plenty of Magnesium on hand

## 2013-01-18 ENCOUNTER — Encounter: Payer: Medicare Other | Admitting: Cardiovascular Disease

## 2013-01-25 ENCOUNTER — Telehealth: Payer: Self-pay | Admitting: Internal Medicine

## 2013-01-25 NOTE — Telephone Encounter (Signed)
BP 176/68 this afternoon, so it is still high.  States fax was sent last week to Dr. Lorin Picket regarding pt BP being high.  States she received fax back from Dr. Lorin Picket yesterday to inform of pt BP.  Calling today to inform.

## 2013-01-25 NOTE — Telephone Encounter (Signed)
Please notify speech therapy that I spoke to pts daughter today 01/25/13 and discussed her medications.  She is only on amlodipine and metoprolol for her blood pressure.  She still has lisinopril at home.  Will start lisinopril 40mg  (1/2 tab) q day.  Please have them continue to monitor her blood pressure.  Also I would like for them to check met b when they go out on Friday.

## 2013-01-25 NOTE — Telephone Encounter (Signed)
FYI

## 2013-01-26 ENCOUNTER — Inpatient Hospital Stay (HOSPITAL_COMMUNITY)
Admission: EM | Admit: 2013-01-26 | Discharge: 2013-01-31 | DRG: 064 | Disposition: A | Discharge status: Skilled Nursing Facility | Payer: Medicare Other | Source: Other Acute Inpatient Hospital | Attending: Neurology | Admitting: Neurology

## 2013-01-26 ENCOUNTER — Emergency Department: Payer: Self-pay | Admitting: Emergency Medicine

## 2013-01-26 ENCOUNTER — Telehealth: Payer: Self-pay | Admitting: Internal Medicine

## 2013-01-26 DIAGNOSIS — F32A Depression, unspecified: Secondary | ICD-10-CM | POA: Diagnosis present

## 2013-01-26 DIAGNOSIS — Z7901 Long term (current) use of anticoagulants: Secondary | ICD-10-CM

## 2013-01-26 DIAGNOSIS — Z9849 Cataract extraction status, unspecified eye: Secondary | ICD-10-CM

## 2013-01-26 DIAGNOSIS — I4891 Unspecified atrial fibrillation: Secondary | ICD-10-CM | POA: Diagnosis present

## 2013-01-26 DIAGNOSIS — Z8249 Family history of ischemic heart disease and other diseases of the circulatory system: Secondary | ICD-10-CM

## 2013-01-26 DIAGNOSIS — Z951 Presence of aortocoronary bypass graft: Secondary | ICD-10-CM

## 2013-01-26 DIAGNOSIS — I2510 Atherosclerotic heart disease of native coronary artery without angina pectoris: Secondary | ICD-10-CM | POA: Diagnosis present

## 2013-01-26 DIAGNOSIS — Z8542 Personal history of malignant neoplasm of other parts of uterus: Secondary | ICD-10-CM

## 2013-01-26 DIAGNOSIS — E119 Type 2 diabetes mellitus without complications: Secondary | ICD-10-CM

## 2013-01-26 DIAGNOSIS — E1149 Type 2 diabetes mellitus with other diabetic neurological complication: Secondary | ICD-10-CM | POA: Diagnosis present

## 2013-01-26 DIAGNOSIS — E785 Hyperlipidemia, unspecified: Secondary | ICD-10-CM | POA: Diagnosis present

## 2013-01-26 DIAGNOSIS — F329 Major depressive disorder, single episode, unspecified: Secondary | ICD-10-CM | POA: Diagnosis present

## 2013-01-26 DIAGNOSIS — I619 Nontraumatic intracerebral hemorrhage, unspecified: Secondary | ICD-10-CM | POA: Diagnosis present

## 2013-01-26 DIAGNOSIS — I739 Peripheral vascular disease, unspecified: Secondary | ICD-10-CM | POA: Diagnosis present

## 2013-01-26 DIAGNOSIS — I6340 Cerebral infarction due to embolism of unspecified cerebral artery: Principal | ICD-10-CM | POA: Diagnosis present

## 2013-01-26 DIAGNOSIS — Z8673 Personal history of transient ischemic attack (TIA), and cerebral infarction without residual deficits: Secondary | ICD-10-CM

## 2013-01-26 DIAGNOSIS — I10 Essential (primary) hypertension: Secondary | ICD-10-CM

## 2013-01-26 DIAGNOSIS — Z823 Family history of stroke: Secondary | ICD-10-CM

## 2013-01-26 DIAGNOSIS — H53469 Homonymous bilateral field defects, unspecified side: Secondary | ICD-10-CM | POA: Diagnosis present

## 2013-01-26 DIAGNOSIS — E7800 Pure hypercholesterolemia, unspecified: Secondary | ICD-10-CM | POA: Diagnosis present

## 2013-01-26 DIAGNOSIS — E1142 Type 2 diabetes mellitus with diabetic polyneuropathy: Secondary | ICD-10-CM | POA: Diagnosis present

## 2013-01-26 DIAGNOSIS — Z79899 Other long term (current) drug therapy: Secondary | ICD-10-CM

## 2013-01-26 DIAGNOSIS — Z90710 Acquired absence of both cervix and uterus: Secondary | ICD-10-CM

## 2013-01-26 DIAGNOSIS — F3289 Other specified depressive episodes: Secondary | ICD-10-CM | POA: Diagnosis present

## 2013-01-26 LAB — BASIC METABOLIC PANEL
Calcium, Total: 9.6 mg/dL (ref 8.5–10.1)
Creatinine: 0.66 mg/dL (ref 0.60–1.30)
EGFR (African American): >60
EGFR (Non-African Amer.): >60
Osmolality: 271 (ref 275–301)
Sodium: 135 mmol/L — ABNORMAL LOW (ref 136–145)

## 2013-01-26 LAB — URINALYSIS, COMPLETE
Bacteria: NONE SEEN
Ketone: NEGATIVE
Leukocyte Esterase: NEGATIVE
Nitrite: NEGATIVE
Ph: 7 (ref 4.5–8.0)
RBC,UR: <1 /HPF (ref 0–5)
Specific Gravity: 1.013 (ref 1.003–1.030)
Squamous Epithelial: <1
WBC UR: 1 /HPF (ref 0–5)

## 2013-01-26 LAB — CBC
HCT: 39.2 % (ref 35.0–47.0)
MCHC: 33.1 g/dL (ref 32.0–36.0)
MCV: 85 fL (ref 80–100)
Platelet: 197 10*3/uL (ref 150–440)
RBC: 4.61 10*6/uL (ref 3.80–5.20)
RDW: 13.9 % (ref 11.5–14.5)
WBC: 8.8 10*3/uL (ref 3.6–11.0)

## 2013-01-26 LAB — APTT: aPTT: 33 seconds (ref 24–37)

## 2013-01-26 LAB — PROTIME-INR
INR: 1.12 (ref 0.00–1.49)
Prothrombin Time: 14.5 secs (ref 11.5–14.7)
Prothrombin Time: 14.2 seconds (ref 11.6–15.2)

## 2013-01-26 LAB — MRSA PCR SCREENING: MRSA by PCR: NEGATIVE

## 2013-01-26 LAB — TROPONIN I: Troponin-I: <0.02 ng/mL

## 2013-01-26 MED ORDER — PANTOPRAZOLE SODIUM 40 MG IV SOLR
40.0000 mg | Freq: Every day | INTRAVENOUS | Status: DC
  Administered 2013-01-26 – 2013-01-27 (×2): 40 mg via INTRAVENOUS
  Filled 2013-01-26 (×4): qty 40

## 2013-01-26 MED ORDER — LABETALOL HCL 5 MG/ML IV SOLN
10.0000 mg | INTRAVENOUS | Status: DC | PRN
  Administered 2013-01-26 – 2013-01-28 (×2): 10 mg via INTRAVENOUS
  Filled 2013-01-26 (×2): qty 4

## 2013-01-26 MED ORDER — ACETAMINOPHEN 325 MG PO TABS
650.0000 mg | ORAL_TABLET | ORAL | Status: DC | PRN
  Administered 2013-01-26 – 2013-01-31 (×15): 650 mg via ORAL
  Filled 2013-01-26 (×15): qty 2

## 2013-01-26 MED ORDER — ACETAMINOPHEN 650 MG RE SUPP: 650.0000 mg | RECTAL | Status: DC | PRN

## 2013-01-26 MED ORDER — SODIUM CHLORIDE 0.9 % IV SOLN
INTRAVENOUS | Status: DC
  Administered 2013-01-27: 14:00:00 via INTRAVENOUS

## 2013-01-26 MED ORDER — SENNOSIDES-DOCUSATE SODIUM 8.6-50 MG PO TABS
1.0000 | ORAL_TABLET | Freq: Two times a day (BID) | ORAL | Status: DC
  Administered 2013-01-26 – 2013-01-31 (×7): 1 via ORAL
  Filled 2013-01-26 (×10): qty 1

## 2013-01-26 NOTE — Progress Notes (Signed)
[facility]  Patient Status: Active (receiving services up to time of hospitalization)  [facility] is providing the following services: RN, PT and OT  If patient discharges after hours, please call [phone].   Jodene Nam 01/26/2013, 3:39 PM

## 2013-01-26 NOTE — Telephone Encounter (Signed)
Per Dr. [name] was transported to [facility] (in-patient)

## 2013-01-26 NOTE — Telephone Encounter (Signed)
Called to confirm pt was taken to ED. No answer on pt's home number, no answering machine, unable to leave message regarding calling office regarding followup. FYI to Dr. Lorin Picket

## 2013-01-26 NOTE — Telephone Encounter (Signed)
Please call ER later and confirm pt has arrived.

## 2013-01-26 NOTE — Progress Notes (Signed)
UR completed.  Zan Triska, RN BSN [facility] CCM Trauma/Neuro ICU Case Manager [phone]  

## 2013-01-26 NOTE — Telephone Encounter (Signed)
Pt was seen in ER & discharged (records requested)

## 2013-01-26 NOTE — H&P (Signed)
Admission H&P    Chief Complaint: New-onset visual changes and unstable gait.  HPI: [name] is an 79 y.o. female  with a history of diabetes mellitus, hypertension, hyperlipidemia, atrial fibrillation on anticoagulation, and recent stroke, who presented to the emergency room at [facility] earlier today with new onset gait instability and visual changes. She also complaining of ringing in the ears. Symptoms were present when she woke up this morning. She was last known well at about 8:30 last night. She has been on apixaban and also on aspirin. CT scan of her head showed evidence of acute stroke involving the left occipital region with hemorrhagic involvement. There was no mass effect. She was subsequently transferred to [facility] for further management. NIH stroke score was 3.  LSN: 8:30 PM on 01/26/1999 410 tPA Given: No: ICH; acute stroke one month ago; on anticoagulation with apixaban. MRankin: 1  Past Medical History  Diagnosis Date  . Type II or unspecified type diabetes mellitus without mention of complication, not stated as uncontrolled   . Essential hypertension, benign   . Anemia, unspecified   . Atrial fibrillation   . Hypercholesterolemia   . Osteoarthritis   . History of TIAs   . CVA (cerebral infarction)   . Atrial fibrillation   . Uterine cancer     s/p TAH/BSO  . CAD (coronary artery disease)     s/p CABG 10/99 with LIMA to the LAD, SVG to OM1, SVG to distal RCA    Past Surgical History  Procedure Laterality Date  . Total abdominal hysterectomy w/ bilateral salpingoophorectomy  1969  . Coronary artery bypass graft  1999    x4  . Cataract extraction      Right eye    Family History  Problem Relation Age of Onset  . Heart disease Mother 65    Died of heart attack  . Stroke Father 63    died of cva  . CAD Brother     multiple brothers   Social History:  reports that she has never smoked. She has never used smokeless tobacco. She reports  that she does not drink alcohol. Her drug history is not on file.  Allergies:  Allergies  Allergen Reactions  . Erythromycin Ethylsuccinate   . Mycinette [Phenol]     Medications Prior to Admission  Medication Sig Dispense Refill  . amLODipine (NORVASC) 5 MG tablet Take 1 tablet (5 mg total) by mouth daily.  90 tablet  1  . aspirin 325 MG EC tablet Take 325 mg by mouth daily.      Marland Kitchen gabapentin (NEURONTIN) 100 MG capsule Take one tablet before bed  30 capsule  1  . glucose blood test strip 1 each by Other route as needed for other. Use one test strip bid      . hydrochlorothiazide (HYDRODIURIL) 25 MG tablet Take 1 tablet (25 mg total) by mouth every morning.  90 tablet  3  . lisinopril (PRINIVIL,ZESTRIL) 40 MG tablet Take 1 tablet (40 mg total) by mouth daily.  90 tablet  3  . metoprolol (LOPRESSOR) 50 MG tablet Take 1 tablet (50 mg total) by mouth 2 (two) times daily.  180 tablet  3  . sertraline (ZOLOFT) 25 MG tablet Take 1 tablet (25 mg total) by mouth daily.  90 tablet  1    ROS: History obtained from the patient  General ROS: negative for - chills, fatigue, fever, night sweats, weight gain or weight loss Psychological ROS: negative  for - behavioral disorder, hallucinations, memory difficulties, mood swings or suicidal ideation Ophthalmic ROS: negative for - blurry vision, double vision, eye pain or loss of vision ENT ROS: negative for - epistaxis, nasal discharge, oral lesions, sore throat, tinnitus or vertigo Allergy and Immunology ROS: negative for - hives or itchy/watery eyes Hematological and Lymphatic ROS: negative for - bleeding problems, bruising or swollen lymph nodes Endocrine ROS: negative for - galactorrhea, hair pattern changes, polydipsia/polyuria or temperature intolerance Respiratory ROS: negative for - cough, hemoptysis, shortness of breath or wheezing Cardiovascular ROS: negative for - chest pain, dyspnea on exertion, edema or irregular heartbeat Gastrointestinal  ROS: negative for - abdominal pain, diarrhea, hematemesis, nausea/vomiting or stool incontinence Genito-Urinary ROS: negative for - dysuria, hematuria, incontinence or urinary frequency/urgency Musculoskeletal ROS: negative for - joint swelling or muscular weakness Neurological ROS: as noted in HPI Dermatological ROS: negative for rash and skin lesion changes  Physical Examination: Blood pressure 137/67, pulse 92, temperature 97.5 F (36.4 C), temperature source Oral, resp. rate 21, SpO2 100.00%.  HEENT-  Normocephalic, no lesions, without obvious abnormality.  Normal external eye and conjunctiva.  Normal TM's bilaterally.  Normal auditory canals and external ears. Normal external nose, mucus membranes and septum.  Normal pharynx. Neck supple with no masses, nodes, nodules or enlargement. Cardiovascular - irregularly irregular rhythm, S1, S2 normal and systolic murmur: systolic ejection 3/6, crescendo throughout the precordium Lungs - chest clear, no wheezing, rales, normal symmetric air entry, Heart exam - S1, S2 normal, no murmur, no gallop, rate regular Abdomen - soft, non-tender; bowel sounds normal; no masses,  no organomegaly Extremities - no joint deformities, effusion, or inflammation and no skin discoloration  Neurologic Examination: Mental Status: Alert, oriented, thought content appropriate.  Speech fluent without evidence of aphasia. Able to follow commands without difficulty. Cranial Nerves: II-dense right homonymous hemianopsia. III/IV/VI-Pupils were equal and reacted. Extraocular movements were full and conjugate.    V/VII-slight right facial numbness; no facial weakness. VIII-normal. X-normal speech and symmetrical palatal movement. Motor: 5/5 bilaterally with normal tone and bulk Sensory: Reduced perception of tactile sensation over the right lower extremity compared to left lower extremity. Deep Tendon Reflexes: 1+ and symmetric. Plantars: Mute bilaterally Cerebellar:  Normal finger-to-nose testing. Carotid auscultation: Normal  No results found for this or any previous visit (from the past 48 hour(s)). No results found.  Assessment: 79 y.o. female with multiple risk factors for stroke including atrial fibrillation on anticoagulation, as well as diabetes mellitus, hypertension and hyperlipidemia, and recent left cerebral infarction, presenting with new left occipital infarction with hemorrhagic involvement. No mass effect noted.  Stroke Risk Factors - atrial fibrillation, diabetes mellitus, family history, hyperlipidemia and hypertension  Plan: 1. Stat protime and APTT; anticoagulation reversal measures if high-normal or supratherapeutic. 2. MRI, MRA  of the brain without contrast 3. PT consult, OT consult 4. HgbA1c, fasting lipid panel 5. Carotid dopplers 6. Prophylactic therapy-None 7. Risk factor modification 8. Telemetry monitoring  C.R. Roseanne Reno, MD [facility] [phone]  01/26/2013, 2:36 PM

## 2013-01-26 NOTE — Telephone Encounter (Signed)
Tried to contact patient-no answer or voicemail. Will try again later

## 2013-01-26 NOTE — Telephone Encounter (Signed)
Patient Information:  Caller Name: [name]  Phone: [phone]  Patient: [name]  Gender: Female  DOB: 1922/03/17  Age: 79 Years  PCP: Dale [place]  Office Follow Up:  Does the office need to follow up with this patient?: No  Instructions For The Office: N/A   Symptoms  Reason For Call & Symptoms: Daughter [name] calling about High Blood Pressure.  BP 187/78 at 07:00 and 190/74 more recently.  Suden loss of hearing upon waking at 06:30.  She also has new weakness.  History of recent CVA.  Call EMS 911 Now due to Sounds like a life-theratening emergency to the triager.  Caller agreed.  Reviewed Health History In EMR: Yes  Reviewed Medications In EMR: Yes  Reviewed Allergies In EMR: Yes  Reviewed Surgeries / Procedures: Yes  Date of Onset of Symptoms: 01/26/2013  Treatments Tried: Gave BP medications  Treatments Tried Worked: No  Guideline(s) Used:  Neurologic Deficit  Disposition Per Guideline:   Call EMS 911 Now  Reason For Disposition Reached:   Sounds like a life-threatening emergency to the triager  Advice Given:  N/A  Patient Will Follow Care Advice:  YES

## 2013-01-26 NOTE — Telephone Encounter (Signed)
Noted.  Please call pt later or tomorrow and confirm doing ok.  Thanks.

## 2013-01-27 ENCOUNTER — Encounter (HOSPITAL_COMMUNITY): Payer: Self-pay | Admitting: General Practice

## 2013-01-27 DIAGNOSIS — I4891 Unspecified atrial fibrillation: Secondary | ICD-10-CM

## 2013-01-27 DIAGNOSIS — I619 Nontraumatic intracerebral hemorrhage, unspecified: Secondary | ICD-10-CM

## 2013-01-27 NOTE — Evaluation (Signed)
Speech Language Pathology Evaluation Patient Details Name: [name] MRN: [id number] DOB: Jun 23, 1922 Today's Date: 01/27/2013 Time: 4098-1191 SLP Time Calculation (min): 18 min  Problem List:  Patient Active Problem List   Diagnosis Date Noted  . ICH (intracerebral hemorrhage) 01/26/2013  . Neuropathy 12/18/2012  . Hyponatremia 06/19/2012  . Atrial fibrillation 06/17/2012  . Essential hypertension, benign 06/17/2012  . CAD (coronary artery disease) 06/17/2012  . Peripheral vascular disease 06/17/2012  . Diabetes 06/17/2012  . Hypercholesterolemia 06/17/2012  . Depression 06/17/2012   Past Medical History:  Past Medical History  Diagnosis Date  . Type II or unspecified type diabetes mellitus without mention of complication, not stated as uncontrolled   . Essential hypertension, benign   . Anemia, unspecified   . Atrial fibrillation   . Hypercholesterolemia   . Osteoarthritis   . History of TIAs   . CVA (cerebral infarction)   . Atrial fibrillation   . Uterine cancer     s/p TAH/BSO  . CAD (coronary artery disease)     s/p CABG 10/99 with LIMA to the LAD, SVG to OM1, SVG to distal RCA   Past Surgical History:  Past Surgical History  Procedure Laterality Date  . Total abdominal hysterectomy w/ bilateral salpingoophorectomy  1969  . Coronary artery bypass graft  1999    x4  . Cataract extraction      Right eye   HPI:  [name] is an 79 y.o. female  with a history of diabetes mellitus, hypertension, hyperlipidemia, atrial fibrillation on anticoagulation, and recent stroke, who presented to the emergency room at [facility] earlier today with new onset gait instability and visual changes. She also complaining of ringing in the ears. Symptoms were present when she woke up this morning. She was last known well at about 8:30 last night. She has been on apixaban and also on aspirin. CT scan of her head showed evidence of acute stroke involving the  left occipital region with hemorrhagic involvement. There was no mass effect. She was subsequently transferred to [facility] for further management. NIH stroke score was 3.   Assessment / Plan / Recommendation Clinical Impression  Pt presents with mild cognitive impairment, likely form both age and prior CVA. Pt had assist from dtr at home for complex tasks. She does verbalize and demonstrate new right field visual deficits which complicate functional problem solving. SLP provided compensatory strategies for this and memory. Pt reports she had just started home health SLP prior to new CVA. She tells PT she would like f/u at [facility]. No acute needs, defer f/u to SNF level.     SLP Assessment  All further Speech Lanaguage Pathology  needs can be addressed in the next venue of care    Follow Up Recommendations  SNF SLP    Frequency and Duration        Pertinent Vitals/Pain NA   SLP Goals     SLP Evaluation Prior Functioning  Cognitive/Linguistic Baseline: Baseline deficits Baseline deficit details: memory high leve word finding.  Type of Home: House  Lives With: Daughter Available Help at Discharge: Family;Available PRN/intermittently Education: limited education per pt Vocation: Retired   IT consultant  Overall Cognitive Status: Within Functional Limits for tasks assessed Arousal/Alertness: Awake/alert Orientation Level: Oriented X4 Attention: Focused;Sustained;Selective;Alternating Focused Attention: Appears intact Sustained Attention: Appears intact Selective Attention: Appears intact Alternating Attention: Appears intact Memory: Impaired Memory Impairment: Decreased recall of new information Awareness: Appears intact Problem Solving: Impaired Problem Solving Impairment: Functional complex;Verbal complex (  requires extra time, min cues) Executive Function: Self Correcting;Self Monitoring;Initiating Initiating: Appears intact Self Monitoring: Appears intact Self Correcting: Appears  intact Safety/Judgment: Appears intact    Comprehension  Auditory Comprehension Overall Auditory Comprehension: Impaired (needs repetition of complex informatio) Yes/No Questions: Within Functional Limits Commands: Within Functional Limits Conversation: Complex (need repetition and redirection with coplex info) Interfering Components: Visual impairments;Hearing;Processing speed;Working memory EffectiveTechniques: Extra processing time;Repetition;Slowed speech Visual Recognition/Discrimination Discrimination: Within Function Limits Reading Comprehension Reading Status: Within funtional limits    Expression Verbal Expression Overall Verbal Expression: Impaired at baseline (Pt reports word finding trouble from first CVA, high level) Initiation: No impairment Automatic Speech: Name;Social Response Level of Generative/Spontaneous Verbalization: Conversation Repetition: No impairment Naming: No impairment Pragmatics: No impairment   Oral / Motor Oral Motor/Sensory Function Overall Oral Motor/Sensory Function: Appears within functional limits for tasks assessed Motor Speech Overall Motor Speech: Appears within functional limits for tasks assessed   GO     [name], [name] 01/27/2013, 11:32 AM

## 2013-01-27 NOTE — Progress Notes (Signed)
Occupational Therapy Evaluation Patient Details Name: [name] MRN: [id number] DOB: October 23, 1922 Today's Date: 01/27/2013 Time: 1410-1444 OT Time Calculation (min): 34 min  OT Assessment / Plan / Recommendation History of present illness pt presents with gait difficulties and visual changes and recent hx of CVA.     Clinical Impression  CT scan of her head showed evidence of acute stroke involving the left occipital region with hemorrhagic involvement  PTA, pt lived with daughter and was mod I with mobility and required S with ADL. Pt was at home alone while daughter worked, although daughter came home @ lunch everyday to check on her.  Pt presents with apparent R homonymous hemianopsia, and requires increased assistance with ADL and mobility. Family states that they prefer pt be D/C home. Feel pt is an excellent CIR consult, however, will need 24/7 S after D/C. Pt will benefit from skilled OT services to facilitate D/C to next venue due to below deficits.    OT Assessment  Patient needs continued OT Services    Follow Up Recommendations  CIR    Barriers to Discharge      Equipment Recommendations  Tub/shower bench    Recommendations for Other Services Rehab consult  Frequency  Min 3X/week    Precautions / Restrictions Precautions Precautions: Fall Restrictions Weight Bearing Restrictions: No   Pertinent Vitals/Pain BP 167/59 HR 89 O2 97 RA    ADL  Eating/Feeding: Minimal assistance (vc to ID food in R field) Grooming: Minimal assistance Where Assessed - Grooming: Supported sitting Upper Body Bathing: Minimal assistance Where Assessed - Upper Body Bathing: Supported sitting Lower Body Bathing: Moderate assistance Where Assessed - Lower Body Bathing: Supported sit to stand Upper Body Dressing: Moderate assistance Where Assessed - Upper Body Dressing: Supported sit to stand Lower Body Dressing: Moderate assistance Where Assessed - Lower Body Dressing: Supported sit  to stand Toilet Transfer: Minimal assistance;Simulated Statistician Method: Other (comment) (ambulating) Acupuncturist: Other (comment) (bed - chair) Toileting - Clothing Manipulation and Hygiene: Minimal assistance Equipment Used: Rolling walker;Gait belt Transfers/Ambulation Related to ADLs: min A. mod vc for positioning in RW ADL Comments: functional decline    OT Diagnosis: Generalized weakness;Disturbance of vision;Cognitive deficits  OT Problem List: Decreased strength;Decreased activity tolerance;Impaired vision/perception;Decreased cognition;Decreased safety awareness;Cardiopulmonary status limiting activity OT Treatment Interventions: Self-care/ADL training;Therapeutic exercise;Neuromuscular education;DME and/or AE instruction;Therapeutic activities;Cognitive remediation/compensation;Visual/perceptual remediation/compensation;Patient/family education;Balance training   OT Goals(Current goals can be found in the care plan section) Acute Rehab OT Goals Patient Stated Goal: to do more for myself OT Goal Formulation: With patient Time For Goal Achievement: 02/10/13 Potential to Achieve Goals: Good  Visit Information  Last OT Received On: 01/27/13 Assistance Needed: +1 History of Present Illness: pt presents with gait difficulties and visual changes and recent hx of CVA.         Prior Functioning     Home Living Family/patient expects to be discharged to:: Private residence Living Arrangements: Children (daughter) Available Help at Discharge: Family;Available PRN/intermittently Type of Home: House Home Access: Stairs to enter [employer] of Steps: 1 (and 1) Entrance Stairs-Rails: None Home Layout: One level Home Equipment: Walker - 4 wheels;Shower seat (rollator)  Lives With: Daughter Prior Function Level of Independence: Independent with assistive device(s) Comments: prior CVA 3 weeks ago Communication Communication: No  difficulties Dominant Hand: Right         Vision/Perception Vision - History Baseline Vision: Wears glasses only for reading Patient Visual Report: Other (comment) (difficulty seeing things in far right gaze)  Vision - Assessment Eye Alignment: Within Functional Limits Vision Assessment: Vision tested Ocular Range of Motion: Restricted on the right;Impaired-to be further tested in functional context Alignment/Gaze Preference: Within Defined Limits Tracking/Visual Pursuits: Decreased smoothness of horizontal tracking Saccades: Undershoots;Additional head turns occurred during testing;Decreased speed of saccadic movement Convergence: Within functional limits Visual Fields: Right homonymous hemianopsia Perception Perception: Within Functional Limits Praxis Praxis: Intact   Cognition  Cognition Arousal/Alertness: Awake/alert Behavior During Therapy: WFL for tasks assessed/performed Overall Cognitive Status: Impaired/Different from baseline Area of Impairment: Memory;Awareness Memory: Decreased short-term memory (memory deficits from previous stroke)    Extremity/Trunk Assessment Upper Extremity Assessment Upper Extremity Assessment: Generalized weakness Lower Extremity Assessment Lower Extremity Assessment: Generalized weakness Cervical / Trunk Assessment Cervical / Trunk Assessment: Normal     Mobility Bed Mobility Bed Mobility: Supine to Sit;Sitting - Scoot to Edge of Bed Supine to Sit: 4: Min assist;HOB elevated Sitting - Scoot to Edge of Bed: 3: Mod assist Details for Bed Mobility Assistance: cues for sequencing and utilized pad to A with scooting to EOB.   Transfers Sit to Stand: 4: Min assist;With upper extremity assist;From bed Stand to Sit: 4: Min assist;With upper extremity assist;To bed;To chair/3-in-1 Details for Transfer Assistance: pt demos good use of UEs.  pt with audible creaking in joints during transfers and pt indicates hx of arthritis.       Exercise      Balance Balance Balance Assessed: Yes Static Sitting Balance Static Sitting - Balance Support: No upper extremity supported;Feet supported Static Sitting - Level of Assistance: 5: Stand by assistance High Level Balance High Level Balance Activites: Direction changes High Level Balance Comments: min A. poor positioning inside RW. Running into objects on R   End of Session OT - End of Session Equipment Utilized During Treatment: Rolling walker Activity Tolerance: Patient tolerated treatment well Patient left: in chair;with call bell/phone within reach Nurse Communication: Mobility status  GO     Theone Bowell,HILLARY 01/27/2013, 3:11 PM [facility], OTR/L  [phone] 01/27/2013

## 2013-01-27 NOTE — Evaluation (Signed)
Physical Therapy Evaluation Patient Details Name: [name] MRN: [id number] DOB: May 26, 1922 Today's Date: 01/27/2013 Time: 4098-1191 PT Time Calculation (min): 24 min  PT Assessment / Plan / Recommendation History of Present Illness  pt presents with gait difficulties and visual changes and recent hx of CVA.    Clinical Impression  Pt agreeable to OOB.  Pt generally weak and debilitated.  Pt states that her daughter works during the day and that she does not have anyone that can be with her 24hrs.  Pt requests going to a facility in the [place] area so that she can be close to home.      PT Assessment  Patient needs continued PT services    Follow Up Recommendations  SNF    Does the patient have the potential to tolerate intense rehabilitation      Barriers to Discharge Decreased caregiver support pt's daughter works during the day.      Equipment Recommendations  None recommended by PT    Recommendations for Other Services     Frequency Min 4X/week    Precautions / Restrictions Precautions Precautions: Fall Restrictions Weight Bearing Restrictions: No   Pertinent Vitals/Pain Pt indicates arthritis pain in bil knees and hips.        Mobility  Bed Mobility Bed Mobility: Supine to Sit;Sitting - Scoot to Edge of Bed Supine to Sit: 4: Min assist;HOB elevated Sitting - Scoot to Edge of Bed: 3: Mod assist Details for Bed Mobility Assistance: cues for sequencing and utilized pad to A with scooting to EOB.   Transfers Transfers: Sit to Stand;Stand to [employer] Transfers Sit to Stand: 4: Min assist;With upper extremity assist;From bed Stand to Sit: 4: Min assist;With upper extremity assist;To bed;To chair/3-in-1 Stand Pivot Transfers: 4: Min assist Details for Transfer Assistance: pt demos good use of UEs.  pt with audible creaking in joints during transfers and pt indicates hx of arthritis.   Ambulation/Gait Ambulation/Gait Assistance: 1: +2 Total  assist Ambulation/Gait: Patient Percentage: 60% Ambulation Distance (Feet): 10 Feet Assistive device: 2 person hand held assist Ambulation/Gait Assistance Details: pt unsteady and with shuffled gait and fatigues quickly.   Gait Pattern: Step-through pattern;Decreased stride length;Shuffle;Trunk flexed Stairs: No Wheelchair Mobility Wheelchair Mobility: No Modified Rankin (Stroke Patients Only) Pre-Morbid Rankin Score: Slight disability Modified Rankin: Moderately severe disability    Exercises     PT Diagnosis: Difficulty walking;Generalized weakness  PT Problem List: Decreased strength;Decreased activity tolerance;Decreased balance;Decreased mobility;Decreased knowledge of use of DME;Pain PT Treatment Interventions: DME instruction;Gait training;Stair training;Functional mobility training;Therapeutic activities;Therapeutic exercise;Balance training;Neuromuscular re-education;Patient/family education     PT Goals(Current goals can be found in the care plan section) Acute Rehab PT Goals Patient Stated Goal: Feel stronger PT Goal Formulation: With patient Time For Goal Achievement: 02/10/13 Potential to Achieve Goals: Good  Visit Information  Last PT Received On: 01/27/13 Assistance Needed: +1 History of Present Illness: pt presents with gait difficulties and visual changes and recent hx of CVA.         Prior Functioning  Home Living Family/patient expects to be discharged to:: Private residence Living Arrangements: Children (daughter) Available Help at Discharge: Family;Available PRN/intermittently Type of Home: House Home Access: Stairs to enter [employer] of Steps: 1 (and 1) Entrance Stairs-Rails: None Home Layout: One level Home Equipment: Walker - 4 wheels;Shower seat (rollator) Prior Function Level of Independence: Independent with assistive device(s) Communication Communication: No difficulties    Cognition  Cognition Arousal/Alertness:  Awake/alert Behavior During Therapy: WFL for tasks assessed/performed Overall  Cognitive Status: Within Functional Limits for tasks assessed    Extremity/Trunk Assessment Upper Extremity Assessment Upper Extremity Assessment: Defer to OT evaluation Lower Extremity Assessment Lower Extremity Assessment: Generalized weakness   Balance Balance Balance Assessed: Yes Static Sitting Balance Static Sitting - Balance Support: No upper extremity supported;Feet supported Static Sitting - Level of Assistance: 5: Stand by assistance  End of Session PT - End of Session Equipment Utilized During Treatment: Gait belt Activity Tolerance: Patient limited by fatigue;Patient limited by pain Patient left: in chair;with call bell/phone within reach Nurse Communication: Mobility status  GP     Sunny Schlein, [place] [phone] 01/27/2013, 10:18 AM

## 2013-01-27 NOTE — Progress Notes (Signed)
Rehab Admissions Coordinator Note:  Patient was screened by Trish Mage for appropriateness for an Inpatient Acute Rehab Consult.  At this time, we are recommending Inpatient Rehab consult.  Trish Mage 01/27/2013, 4:29 PM  I can be reached at [phone].

## 2013-01-27 NOTE — Progress Notes (Addendum)
Stroke Team Progress Note  HISTORY  [name] is an 79 y.o. female with history significant for diabetes mellitus, hypertension, hyperlipidemia, atrial fibrillation on anticoagulation, and recent stroke Nov 2014 at outside facility whereby she is currently receiving Home Health PT/OT and Speech Therapy. She presented to [facility] ED with new onset gait instability, visual changes, and  ringing in the ears when she woke up this yesterday (01/25/2013). She was last known well at about 8:30 the previous night. She was on apixaban and aspirin at the time. CT scan of her head showed evidence of acute stroke involving the left occipital region with hemorrhagic involvement. There was no mass effect. She was subsequently transferred to [facility] for further management. NIH stroke score was 3.  Patient was not a TPA candidate secondary to recent stroke on apixiban. She was admitted for further evaluation and treatment. Review of outside imagining shows likely Left PCA embolic event thus has delayed hemorrhagic transformation as she is now several weeks post acute cerebral infacrtion.  SUBJECTIVE No family at the bedside.  Overall she feels her condition is unchanged but has difficulty articulating and recalling events.  OBJECTIVE Most recent Vital Signs: Filed Vitals:   01/27/13 0500 01/27/13 0600 01/27/13 0700 01/27/13 0800  BP: 158/52 146/66 162/55 162/53  Pulse: 60 76 56 45  Temp:   98.2 F (36.8 C)   TempSrc:   Oral   Resp: 14 21 15 21   SpO2: 100% 100% 99% 96%    IV Fluid Intake:   . sodium chloride 75 mL/hr at 01/27/13 0800    MEDICATIONS  . pantoprazole (PROTONIX) IV  40 mg Intravenous QHS  . senna-docusate  1 tablet Oral BID   PRN:  acetaminophen, acetaminophen, labetalol  Diet:  Cardiac thin liquids Activity:  Up with assistance DVT Prophylaxis:  SCDs  CLINICALLY SIGNIFICANT STUDIES BMET    Component Value Date/Time   NA 133* 12/06/2012 0814   NA 133*  12/06/2012 0814   NA 132* 08/27/2011   K 4.4 12/06/2012 0814   CL 93* 12/06/2012 0814   CO2 31 12/06/2012 0814   GLUCOSE 119* 12/06/2012 0814   BUN 15 12/06/2012 0814   BUN 26* 08/27/2011   CREATININE 0.5 12/06/2012 0814   CREATININE 0.7 08/27/2011   CALCIUM 9.6 12/06/2012 0814    CBC    Component Value Date/Time   WBC 9.8 07/26/2012 1713   RBC 4.32 07/26/2012 1713   HGB 13.0 07/26/2012 1713   HCT 38.7 07/26/2012 1713   PLT 260.0 07/26/2012 1713   MCV 89.6 07/26/2012 1713   MCHC 33.7 07/26/2012 1713   RDW 14.3 07/26/2012 1713   LYMPHSABS 1.8 07/26/2012 1713   MONOABS 1.0 07/26/2012 1713   EOSABS 0.1 07/26/2012 1713   BASOSABS 0.0 07/26/2012 1713     Coagulation:  Recent Labs Lab 01/26/13 1545  LABPROT 14.2  INR 1.12   Lipid Panel    Component Value Date/Time   CHOL 206* 12/06/2012 0814   TRIG 71.0 12/06/2012 0814   HDL 94.80 12/06/2012 0814   CHOLHDL 2 12/06/2012 0814   VLDL 14.2 12/06/2012 0814   HgbA1C  Lab Results  Component Value Date   HGBA1C 6.5 12/06/2012    CT of the brain    MRI of the brain    MRA of the brain    2D Echocardiogram    Carotid Doppler    CXR    EKG  Pending   Therapy Recommendations SNF  Physical Exam  General: Well-developed, well-nourished, elderly White female, in no acute distress; Head: Normocephalic, atraumatic. Eyes: PERRLA, EOMI Lungs: Normal respiratory effort Heart: IRRR Abdomen: benign Extremities: No pretibial edema, distal pulses intact Neurologic: Mental Status: Alert, oriented, thought content appropriate.  Speech fluent without evidence of aphasia. Diminished recall 0/3. Able to name only 8 animals  Able to follow 3 step commands without difficulty, +word finding difficulties, likely mild baseline cognitive impairment Cranial Nerves: II: right homonymous hemianopia, pupils equal, round, reactive to light and accommodation III,IV, VI: ptosis not present, extra-ocular motions intact bilaterally V,VII: smile  symmetric VIII: grossly hearing normal bilaterally with c/o ringing XII: midline tongue extension without atrophy or fasciculations  Motor: Right : Upper extremity   4/5    Left:     Upper extremity   4/5  Lower extremity   4/5     Lower extremity   4/5 Tone and bulk:normal tone throughout; no atrophy noted Sensory: gross light touch intact throughout, bilaterally Cerebellar: normal finger-to-nose  ASSESSMENT Ms. [name] is a 79 y.o. female presenting with gait instability, visual changes, and ear ringing. Recent CVA Nov 2014.  Outside CT Imaging confirms a left occipital lobe infarct with hemorrhagic transformation. Infarct felt to be embolic secondary to atrial fibrillation.  On apixiban prior to admission. Patient with resultant right visual field defect.   Recent embolic left PCA infarct in November 2014 now with hemorrhagic transformation  Atrial fibrillation on apixaban (held)  Hypertension  PVD  Diabetes mellitus with neuropathy  Depression  Hospital day # 1  TREATMENT/PLAN  Discontinue apixiban. Maintain tight control of blood pressure with systolic blood pressure goal below 180.  Repeat Ct scan tomorrow 01/28/2013 if isodense hemorrhage findings consider aspirin therapy at that time for secondary stroke prevention. May resume Apixaban at a lower dose 2.5 twice a day in 7-10 days when CT findings of acute blood go away  CTA imaging  Done at [facility] available on PACS system hence will not repeat  PT/OT recommend SNF, pt lives with daughter who works during the day  Need [facility], pt had complete stroke work-up within the past 30 days    01/27/2013 8:35 AM This patient is critically ill and at significant risk of neurological worsening, death and care requires constant monitoring of vital signs, hemodynamics,respiratory and cardiac monitoring,review of multiple databases, neurological assessment, discussion with family, other specialists and medical decision making  of high complexity. I spent 30 minutes of neurocritical care time  in the care of  this patient. I have personally obtained a history, examined the patient, evaluated imaging results, and formulated the assessment and plan of care. I agree with the above. Delia Heady, MD

## 2013-01-28 ENCOUNTER — Inpatient Hospital Stay (HOSPITAL_COMMUNITY): Payer: Medicare Other

## 2013-01-28 LAB — BASIC METABOLIC PANEL
CO2: 30 mEq/L (ref 19–32)
Calcium: 9.4 mg/dL (ref 8.4–10.5)
Chloride: 101 mEq/L (ref 96–112)
GFR calc Af Amer: >90 mL/min (ref >90)
GFR calc non Af Amer: 78 mL/min — ABNORMAL LOW (ref >90)
Glucose, Bld: 117 mg/dL — ABNORMAL HIGH (ref 70–99)
Potassium: 3.7 mEq/L (ref 3.5–5.1)
Sodium: 138 mEq/L (ref 135–145)

## 2013-01-28 MED ORDER — METOPROLOL TARTRATE 1 MG/ML IV SOLN
5.0000 mg | Freq: Four times a day (QID) | INTRAVENOUS | Status: DC | PRN
Start: 2013-01-28 — End: 2013-01-29

## 2013-01-28 MED ORDER — SERTRALINE HCL 25 MG PO TABS
25.0000 mg | ORAL_TABLET | Freq: Every day | ORAL | Status: DC
  Administered 2013-01-28 – 2013-01-31 (×4): 25 mg via ORAL
  Filled 2013-01-28 (×4): qty 1

## 2013-01-28 MED ORDER — PANTOPRAZOLE SODIUM 40 MG PO TBEC
40.0000 mg | DELAYED_RELEASE_TABLET | Freq: Every day | ORAL | Status: DC
  Administered 2013-01-28 – 2013-01-31 (×4): 40 mg via ORAL
  Filled 2013-01-28 (×4): qty 1

## 2013-01-28 MED ORDER — AMLODIPINE BESYLATE 5 MG PO TABS
5.0000 mg | ORAL_TABLET | Freq: Every day | ORAL | Status: DC
  Administered 2013-01-28 – 2013-01-31 (×4): 5 mg via ORAL
  Filled 2013-01-28 (×4): qty 1

## 2013-01-28 MED ORDER — LISINOPRIL 40 MG PO TABS
40.0000 mg | ORAL_TABLET | Freq: Every day | ORAL | Status: DC
  Filled 2013-01-28: qty 1

## 2013-01-28 MED ORDER — METOPROLOL TARTRATE 50 MG PO TABS
50.0000 mg | ORAL_TABLET | Freq: Two times a day (BID) | ORAL | Status: DC
  Filled 2013-01-28 (×2): qty 1

## 2013-01-28 MED ORDER — METOPROLOL TARTRATE 25 MG PO TABS
25.0000 mg | ORAL_TABLET | Freq: Two times a day (BID) | ORAL | Status: DC
  Administered 2013-01-28 – 2013-01-31 (×7): 25 mg via ORAL
  Filled 2013-01-28 (×9): qty 1

## 2013-01-28 MED ORDER — HYDROCHLOROTHIAZIDE 25 MG PO TABS
25.0000 mg | ORAL_TABLET | Freq: Every morning | ORAL | Status: DC
  Administered 2013-01-28: 25 mg via ORAL
  Filled 2013-01-28: qty 1

## 2013-01-28 NOTE — Progress Notes (Addendum)
Clinical Social Work Department CLINICAL SOCIAL WORK PLACEMENT NOTE 01/28/2013  Patient:  [name], [name]  Account Number:  [id number] Admit date:  01/26/2013  Clinical Social Worker:  Samuella Bruin, Theresia Majors  Date/time:  01/28/2013 12:51 PM  Clinical Social Work is seeking post-discharge placement for this patient at the following level of care:   SKILLED NURSING   (*CSW will update this form in Epic as items are completed)   01/28/2013  Patient/family provided with [facility] Department of Clinical Social Work's list of facilities offering this level of care within the geographic area requested by the patient (or if unable, by the patient's family).  01/28/2013  Patient/family informed of their freedom to choose among providers that offer the needed level of care, that participate in Medicare, Medicaid or managed care program needed by the patient, have an available bed and are willing to accept the patient.    Patient/family informed of [facility]' ownership interest in [facility], as well as of the fact that they are under no obligation to receive care at this facility.  PASARR submitted to EDS on 01/28/2013 PASARR number received from EDS on 01/28/2013 - Clinical Social Work Department CLINICAL SOCIAL WORK PLACEMENT NOTE 01/28/2013  Patient:  [name], [name]  Account Number:  [id number] Admit date:  01/26/2013  Clinical Social Worker:  Samuella Bruin, Theresia Majors  Date/time:  01/28/2013 12:51 PM  Clinical Social Work is seeking post-discharge placement for this patient at the following level of care:   SKILLED NURSING   (*CSW will update this form in Epic as items are completed)   01/28/2013  Patient/family provided with [facility] Department of Clinical Social Work's list of facilities offering this level of care within the geographic area requested by the patient (or if unable, by the patient's family).  01/28/2013  Patient/family informed of their freedom  to choose among providers that offer the needed level of care, that participate in Medicare, Medicaid or managed care program needed by the patient, have an available bed and are willing to accept the patient.    Patient/family informed of [facility]' ownership interest in [facility], as well as of the fact that they are under no obligation to receive care at this facility.  PASARR submitted to EDS on 01/28/2013 PASARR number received from EDS on 01/28/2013 - Clinical Social Work Department CLINICAL SOCIAL WORK PLACEMENT NOTE 01/28/2013  Patient:  [name], [name]  Account Number:  [id number] Admit date:  01/26/2013  Clinical Social Worker:  Samuella Bruin, Theresia Majors  Date/time:  01/28/2013 12:51 PM  Clinical Social Work is seeking post-discharge placement for this patient at the following level of care:   SKILLED NURSING   (*CSW will update this form in Epic as items are completed)   01/28/2013  Patient/family provided with [facility] Department of Clinical Social Work's list of facilities offering this level of care within the geographic area requested by the patient (or if unable, by the patient's family).  01/28/2013  Patient/family informed of their freedom to choose among providers that offer the needed level of care, that participate in Medicare, Medicaid or managed care program needed by the patient, have an available bed and are willing to accept the patient.    Patient/family informed of [facility]' ownership interest in [facility], as well as of the fact that they are under no obligation to receive care at this facility.  PASARR submitted to EDS on 01/28/2013 PASARR number received from EDS on 01/28/2013 - [id number]  FL2 transmitted to all facilities in geographic area requested by pt/family on  01/28/2013 FL2 transmitted to all facilities within larger geographic area on   Patient informed that his/her managed care company has contracts with or will  negotiate with  certain facilities, including the following:     Patient/family informed of bed offers received:   Patient chooses bed at   Physician recommends and patient chooses bed at    Patient to be transferred to  on   Patient to be transferred to facility by   The following physician request were entered in Epic:   Additional Comments:   FL2 transmitted to all facilities in geographic area requested by pt/family on  01/28/2013 FL2 transmitted to all facilities within larger geographic area on   Patient informed that his/her managed care company has contracts with or will negotiate with  certain facilities, including the following:     Patient/family informed of bed offers received:   Patient chooses bed at  Physician recommends and patient chooses bed at    Patient to be transferred to  on   Patient to be transferred to facility by   The following physician request were entered in Epic:   Additional Comments:   FL2 transmitted to all facilities in geographic area requested by pt/family on  01/28/2013 FL2 transmitted to all facilities within larger geographic area on   Patient informed that his/her managed care company has contracts with or will negotiate with  certain facilities, including the following:     Patient/family informed of bed offers received:   Patient chooses bed at [facility] 01/31/2013) Physician recommends and patient chooses bed at    Patient to be transferred to [facility], SNF Ernestine Mcmurray, LCSWA 01/31/13) on  01/31/2013 Patient to be transferred to facility by   The following physician request were entered in Epic:  Additional Comments:  Samuella Bruin, MSW, LCSWA Clinical Social Worker [facility] Emergency Dept. [phone]

## 2013-01-28 NOTE — Progress Notes (Signed)
Stroke Team Progress Note  HISTORY  [name] is an 79 y.o. female with history significant for diabetes mellitus, hypertension, hyperlipidemia, atrial fibrillation on anticoagulation, and recent stroke Nov 2014 at outside facility whereby she is currently receiving Home Health PT/OT and Speech Therapy. She presented to [facility] ED with new onset gait instability, visual changes, and  ringing in the ears when she woke up this yesterday (01/25/2013). She was last known well at about 8:30 the previous night. She was on apixaban and aspirin at the time. CT scan of her head showed evidence of acute stroke involving the left occipital region with hemorrhagic involvement. There was no mass effect. She was subsequently transferred to [facility] for further management. NIH stroke score was 3.  Patient was not a TPA candidate secondary to recent stroke on apixiban. She was admitted for further evaluation and treatment. Review of outside imagining shows likely Left PCA embolic event thus has delayed hemorrhagic transformation as she is now several weeks post acute cerebral infacrtion.  SUBJECTIVE Son at bedside. Patient sitting in chair. No complaints. No headache, nausea, vomiting. Comfortable.   OBJECTIVE Most recent Vital Signs: Filed Vitals:   01/28/13 0400 01/28/13 0500 01/28/13 0600 01/28/13 0700  BP: 150/51 147/60 152/68 153/67  Pulse: 100 97 82 76  Temp: 98.3 F (36.8 C)     TempSrc: Oral     Resp: 18 19 16 16   Height:      Weight:      SpO2: 95% 96% 98% 96%    IV Fluid Intake:   . sodium chloride Stopped (01/27/13 2000)    MEDICATIONS  . amLODipine  5 mg Oral Daily  . hydrochlorothiazide  25 mg Oral q morning - 10a  . lisinopril  40 mg Oral Daily  . metoprolol  50 mg Oral BID  . pantoprazole (PROTONIX) IV  40 mg Intravenous QHS  . senna-docusate  1 tablet Oral BID  . sertraline  25 mg Oral Daily   PRN:  acetaminophen, acetaminophen, labetalol  Diet:   Cardiac thin liquids Activity:  Up with assistance DVT Prophylaxis:  SCDs  CLINICALLY SIGNIFICANT STUDIES BMET    Component Value Date/Time   NA 138 01/28/2013 0530   NA 132* 08/27/2011   K 3.7 01/28/2013 0530   CL 101 01/28/2013 0530   CO2 30 01/28/2013 0530   GLUCOSE 117* 01/28/2013 0530   BUN 13 01/28/2013 0530   BUN 26* 08/27/2011   CREATININE 0.62 01/28/2013 0530   CREATININE 0.7 08/27/2011   CALCIUM 9.4 01/28/2013 0530   GFRNONAA 78* 01/28/2013 0530   GFRAA >90 01/28/2013 0530    CBC    Component Value Date/Time   WBC 9.8 07/26/2012 1713   RBC 4.32 07/26/2012 1713   HGB 13.0 07/26/2012 1713   HCT 38.7 07/26/2012 1713   PLT 260.0 07/26/2012 1713   MCV 89.6 07/26/2012 1713   MCHC 33.7 07/26/2012 1713   RDW 14.3 07/26/2012 1713   LYMPHSABS 1.8 07/26/2012 1713   MONOABS 1.0 07/26/2012 1713   EOSABS 0.1 07/26/2012 1713   BASOSABS 0.0 07/26/2012 1713     Coagulation:   Recent Labs Lab 01/26/13 1545  LABPROT 14.2  INR 1.12   Lipid Panel    Component Value Date/Time   CHOL 206* 12/06/2012 0814   TRIG 71.0 12/06/2012 0814   HDL 94.80 12/06/2012 0814   CHOLHDL 2 12/06/2012 0814   VLDL 14.2 12/06/2012 0814   HgbA1C  Lab Results  Component Value Date  HGBA1C 6.5 12/06/2012    CT of the brain    MRI of the brain    MRA of the brain    2D Echocardiogram    Carotid Doppler    CXR    EKG  Pending   Therapy Recommendations SNF  GENERAL EXAM: Patient is in no distress; well developed, nourished and groomed; neck is supple  CARDIOVASCULAR: IRREG RATE AND RHYTHM; MILD SYSTOLIC MURMUR; no carotid bruits  NEUROLOGIC: MENTAL STATUS: awake, alert, oriented to person, normal attention and concentration, language fluent, comprehension intact, naming intact, fund of knowledge appropriate; DIFFICULTY WITH READING; SPELLS WORDS OUT LETTER BY LETTER, THEN SAY THE WORD.  CRANIAL NERVE: pupils equal and reactive to light, RIGHT HOMONYMOUS HEMIANOPSIA. Extraocular  muscles intact, no nystagmus, facial sensation and strength symmetric, hearing intact, palate elevates symmetrically, uvula midline, shoulder shrug symmetric, tongue midline. MOTOR: normal bulk and tone, full strength in the BUE, LLE; RIGHT LEG LIMITED BY RIGHT KNEE PAIN (4/5 PROX, 5/5 DISTAL). SENSORY: normal and symmetric to light touch COORDINATION: finger-nose-finger normal GAIT/STATION: SITTING IN CHAIR, STABLE.    ASSESSMENT Ms. Larina Lieurance is a 79 y.o. female presenting with gait instability, visual changes, and ear ringing. Recent CVA Nov 2014.  Outside CT Imaging confirms a left occipital lobe infarct with hemorrhagic transformation. Infarct felt to be embolic secondary to atrial fibrillation. On apixiban prior to admission. Patient with resultant right visual field defect.   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 Maintain tight control of blood pressure with systolic blood pressure goal below 180.  Repeat CT head today (01/28/13); if isodense hemorrhage findings consider aspirin therapy at that time for secondary stroke prevention. May resume Apixaban at a lower dose 2.5 twice a day in 7-10 days when CT findings of acute blood go away  CTA imaging done at [facility] available on PACS system, hence will not repeat; patient had complete stroke work-up at [facility] within the past 30 days  PT/OT recommend SNF, pt lives with daughter who works during the day  I reviewed images, labs, notes, records myself. I summarized findings and reviewed with patient, for this high risk condition (left PCA ischemic infarct secondary to atrial fibrillation, with subsequent hemorrhagic transformation) requiring high complexity decision making.    Suanne Marker, MD 01/28/2013, 10:16  AM Certified in Neurology, Neurophysiology and Neuroimaging [facility] - Stroke Team  Please refer to [email or web address] for on-call Stroke MD

## 2013-01-28 NOTE — Progress Notes (Signed)
Clinical Social Work Department BRIEF PSYCHOSOCIAL ASSESSMENT 01/28/2013  Patient:  [name], [name]     Account Number:  [id number]     Admit date:  01/26/2013  Clinical Social Worker:  Hadley Pen  Date/Time:  01/28/2013 11:53 AM  Referred by:  CSW  Date Referred:  01/27/2013 Referred for  SNF Placement   Other Referral:   Interview type:  Patient Other interview type:   Weekend CSW also spoke to patient's son [name] (C: [phone] H: [phone]) who was at the bedside.    PSYCHOSOCIAL DATA Living Status:  FAMILY Admitted from facility:   Level of care:   Primary support name:  [name] & [name] Primary support relationship to patient:  CHILD, ADULT Degree of support available:   Strong;  Contact information: [name] (C: [id number] H: [phone]) ; [name] ([phone] H: [phone])    CURRENT CONCERNS Current Concerns  Post-Acute Placement   Other Concerns:    SOCIAL WORK ASSESSMENT / PLAN Weekend CSW received referral from weekday CSW that patient may need SNF placement in [place]. Weekend CSW met with patient and her son Kara who was at the bedside and explained her role. Patient and son were agreeable to speak. CSW informed patient that PT and OT are recommending intensive physical therapy following discharge and informed her of her options of CIR (possibly), SNF, and home health. Patient states that she wants to return home. Patient currently has home health PT & OT, as well as a home health RN on a weekly basis through [facility]. Per son and patient, patient has been able to ambulate throughout the home with the assistance of a walker. Falls do not seem to be an issue at this time. Patient uses walker, bedside commode, and a "life alert" alarm at home. Patient lives with her daughter [name] who works during the day but checks in with her mother at lunchtime everyday. Patient's grandchildren and nieces are also a source of  support. Son Kara inquired about personal care aides- CSW informed him that patient's primary insurance may not cover those services and would therefore be private pay. Son is interested in looking into this option, as well as seeing if patient's home health services could be "bumped up" to a few more days a week. Patient made it clear that she would like to return home, but was agreeable to a SNF search in [place] as a backup plan.   Assessment/plan status:  Information/Referral to [facility] Other assessment/ plan:   Information/referral to community resources:   Weekend CSW provided patient and son with a SNF list and will fax out to [facility] [employer] SNF's as a backup.    PATIENT'S/FAMILY'S RESPONSE TO PLAN OF CARE: Patient and son Kara were engaged in conversation regarding discharge plans. Son and patient would prefer for patient to return home with her daughter [name], with extra support from relatives and possibly a personal care aide. Patient and son Kara were agreeable to [employer] [employer] SNF search as a backup. Weekend CSW attempted to contact daughter [name] via telephone to update, but was not able to speak with her at this time.     Samuella Bruin, MSW, LCSWA Clinical Social Worker [facility] Emergency Dept. [phone]

## 2013-01-28 NOTE — Care Management Note (Unsigned)
    Page 1 of 1   01/28/2013     2:35:55 PM   CARE MANAGEMENT NOTE 01/28/2013  Patient:  RAYCHELL, HOLCOMB   Account Number:  [id number]  Date Initiated:  01/28/2013  Documentation initiated by:  GRAVES-BIGELOW,Ebrima Ranta  Subjective/Objective Assessment:   Pt admitted for New-onset visual changes and unstable gait. CSW called CM in ref to [facility] services. CSW stated pt is refusing SNF and wants [facility] services with [facility].     Action/Plan:   CM did try to speak to pt, however she was in a procedure. CM did provide Staff RN a PCS list to give to pt once she returns.  CIR has been consulted. CM will continue to monitor.   Anticipated DC Date:  01/30/2013   Anticipated DC Plan:  HOME W HOME HEALTH SERVICES  In-house referral  Clinical Social Worker      DC Planning Services  CM consult      Choice offered to / List presented to:             Status of service:  In process, will continue to follow Medicare Important Message given?   (If response is "NO", the following Medicare IM given date fields will be blank) Date Medicare IM given:   Date Additional Medicare IM given:    Discharge Disposition:    Per UR Regulation:    If discussed at Long Length of Stay Meetings, dates discussed:    Comments:

## 2013-01-28 NOTE — Progress Notes (Signed)
Patient experienced a 6 beat run of V. Tach. Blood pressure is stable along with other vitals, patient arousable. No new changes. Dr. Amada Jupiter called and made aware. BMP ordered. Will continue to monitor. Jacqulynn Cadet

## 2013-01-28 NOTE — Progress Notes (Signed)
Weekend CSW contacted weekend Case Manager Steward Drone to update her about patient wanting to return home with [facility] and increase services if possible. CSW also informed CM that patient and family would like a listing of personal care agencies in [place].   Samuella Bruin, MSW, LCSWA Clinical Social Worker [facility] Emergency Dept. [phone]

## 2013-01-29 DIAGNOSIS — I619 Nontraumatic intracerebral hemorrhage, unspecified: Secondary | ICD-10-CM

## 2013-01-29 DIAGNOSIS — I4891 Unspecified atrial fibrillation: Secondary | ICD-10-CM

## 2013-01-29 DIAGNOSIS — I739 Peripheral vascular disease, unspecified: Secondary | ICD-10-CM

## 2013-01-29 MED ORDER — HYDRALAZINE HCL 20 MG/ML IJ SOLN: 5.0000 mg | Freq: Three times a day (TID) | INTRAMUSCULAR | Status: DC | PRN

## 2013-01-29 MED ORDER — LISINOPRIL 20 MG PO TABS
20.0000 mg | ORAL_TABLET | Freq: Every day | ORAL | Status: DC
  Administered 2013-01-29 – 2013-01-31 (×3): 20 mg via ORAL
  Filled 2013-01-29 (×3): qty 1

## 2013-01-29 NOTE — Progress Notes (Addendum)
Stroke Team Progress Note  HISTORY  [name] is an 79 y.o. female with history significant for diabetes mellitus, hypertension, hyperlipidemia, atrial fibrillation on anticoagulation, and recent stroke Nov 2014 at outside facility whereby she is currently receiving Home Health PT/OT and Speech Therapy. She presented to [facility] ED with new onset gait instability, visual changes, and ringing in the ears when she woke up this yesterday (01/25/2013). She was last known well at about 8:30 the previous night. She was on apixaban and aspirin at the time. CT scan of her head showed evidence of acute stroke involving the left occipital region with hemorrhagic involvement. There was no mass effect. She was subsequently transferred to [facility] for further management. NIH stroke score was 3. Patient was not a TPA candidate secondary to recent stroke on apixiban. She was admitted for further evaluation and treatment. Review of outside imaging shows left PCA embolic event thus has delayed hemorrhagic transformation as she is now several weeks post acute cerebral infarction.  SUBJECTIVE Son at bedside. Patient sitting in chair. No complaints. No headache, nausea, vomiting. Comfortable. BP has been labile.   OBJECTIVE Most recent Vital Signs: Filed Vitals:   01/29/13 0423 01/29/13 0720 01/29/13 1030 01/29/13 1145  BP: 148/75 168/57 163/66 152/76  Pulse: 61 63 76 78  Temp: 97.5 F (36.4 C) 98.1 F (36.7 C)  98 F (36.7 C)  TempSrc: Oral Oral  Oral  Resp: 15 26 19 19   Height:      Weight:      SpO2: 98% 96% 100% 99%    IV Fluid Intake:      MEDICATIONS  . amLODipine  5 mg Oral Daily  . lisinopril  20 mg Oral Daily  . metoprolol tartrate  25 mg Oral BID  . pantoprazole  40 mg Oral Daily  . senna-docusate  1 tablet Oral BID  . sertraline  25 mg Oral Daily   PRN:  acetaminophen, acetaminophen  Diet:  Cardiac thin liquids Activity:  Up with assistance DVT Prophylaxis:   SCDs  CLINICALLY SIGNIFICANT STUDIES BMET    Component Value Date/Time   NA 138 01/28/2013 0530   NA 132* 08/27/2011   K 3.7 01/28/2013 0530   CL 101 01/28/2013 0530   CO2 30 01/28/2013 0530   GLUCOSE 117* 01/28/2013 0530   BUN 13 01/28/2013 0530   BUN 26* 08/27/2011   CREATININE 0.62 01/28/2013 0530   CREATININE 0.7 08/27/2011   CALCIUM 9.4 01/28/2013 0530   GFRNONAA 78* 01/28/2013 0530   GFRAA >90 01/28/2013 0530    CBC    Component Value Date/Time   WBC 9.8 07/26/2012 1713   RBC 4.32 07/26/2012 1713   HGB 13.0 07/26/2012 1713   HCT 38.7 07/26/2012 1713   PLT 260.0 07/26/2012 1713   MCV 89.6 07/26/2012 1713   MCHC 33.7 07/26/2012 1713   RDW 14.3 07/26/2012 1713   LYMPHSABS 1.8 07/26/2012 1713   MONOABS 1.0 07/26/2012 1713   EOSABS 0.1 07/26/2012 1713   BASOSABS 0.0 07/26/2012 1713     Coagulation:   Recent Labs Lab 01/26/13 1545  LABPROT 14.2  INR 1.12   Lipid Panel    Component Value Date/Time   CHOL 206* 12/06/2012 0814   TRIG 71.0 12/06/2012 0814   HDL 94.80 12/06/2012 0814   CHOLHDL 2 12/06/2012 0814   VLDL 14.2 12/06/2012 0814   HgbA1C  Lab Results  Component Value Date   HGBA1C 6.5 12/06/2012    CT of the brain  MRI of the brain    MRA of the brain    2D Echocardiogram    Carotid Doppler    CXR    EKG  Pending   Therapy Recommendations SNF  GENERAL EXAM: Patient is in no distress; well developed, nourished and groomed; neck is supple  CARDIOVASCULAR: IRREG RATE AND RHYTHM; MILD SYSTOLIC MURMUR; no carotid bruits  NEUROLOGIC: MENTAL STATUS: awake, alert, oriented to person, normal attention and concentration, language fluent, comprehension intact, naming intact, fund of knowledge appropriate CRANIAL NERVE: pupils equal and reactive to light, RIGHT HOMONYMOUS HEMIANOPSIA. Extraocular muscles intact, no nystagmus, facial sensation and strength symmetric, hearing intact, palate elevates symmetrically, uvula midline, shoulder shrug  symmetric, tongue midline. MOTOR: normal bulk and tone, full strength in the BUE, LLE; RIGHT LEG LIMITED BY RIGHT KNEE PAIN (4/5 PROX, 5/5 DISTAL). SENSORY: normal and symmetric to light touch COORDINATION: finger-nose-finger normal GAIT/STATION: SITTING IN CHAIR, STABLE.    ASSESSMENT [name] is a 79 y.o. female presenting with gait instability, visual changes, and ear ringing. Recent CVA Nov 2014.  Outside CT Imaging confirms a left occipital lobe infarct with hemorrhagic transformation. Infarct felt to be embolic secondary to atrial fibrillation. On apixiban prior to admission. Patient with resultant right visual field defect.   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 I summarized findings and reviewed with patient, for this high risk condition (left PCA ischemic infarct secondary to atrial fibrillation, with subsequent hemorrhagic transformation) requiring high complexity decision making.    Suanne Marker, MD 01/29/2013, 12:08 PM Certified in Neurology, Neurophysiology and Neuroimaging [facility] - Stroke Team  Please refer to [email or web address] for on-call Stroke MD

## 2013-01-30 NOTE — Progress Notes (Signed)
Physical Therapy Treatment Patient Details Name: [name] MRN: [id number] DOB: 1922-07-26 Today's Date: 01/30/2013 Time: 5621-3086 PT Time Calculation (min): 45 min  PT Assessment / Plan / Recommendation  History of Present Illness pt presents with gait difficulties and visual changes and recent hx of CVA.     PT Comments   Son present this am.  Lengthy discussion with pt and son about D/C planning, pt mobility and safety.  Pt wishes to return home and son notes the family is trying to workout increased care for her at home, but there are still periods of time when she would be alone.  Discussed importance of continued therapies to maximize independence and decrease level of A needed.  Pt and son to discuss D/C options, however at this time pt seems to be leaing towards home.  If D/Cs to home will need HHPT, HHOT, HHAide, and HHRN.    Follow Up Recommendations  Home health PT;Supervision/Assistance - 24 hour     Does the patient have the potential to tolerate intense rehabilitation     Barriers to Discharge        Equipment Recommendations  None recommended by PT    Recommendations for Other Services    Frequency Min 4X/week   Progress towards PT Goals Progress towards PT goals: Progressing toward goals  Plan Discharge plan needs to be updated    Precautions / Restrictions Precautions Precautions: Fall Restrictions Weight Bearing Restrictions: No   Pertinent Vitals/Pain Bil knees "just very stiff".      Mobility  Bed Mobility Bed Mobility: Supine to Sit;Sitting - Scoot to Edge of Bed Supine to Sit: 5: Supervision;HOB elevated Sitting - Scoot to Edge of Bed: 5: Supervision Details for Bed Mobility Assistance: pt moves slowly and utilizes HOB elevated.  Increased time needed to scoot to EOB without A.   Transfers Transfers: Sit to Stand;Stand to Sit Sit to Stand: 4: Min assist;With upper extremity assist;From bed Stand to Sit: 4: Min guard;With upper extremity  assist;To chair/3-in-1 Details for Transfer Assistance: cues for UE use as pt trying to pull on RW.  demos good technique with coming tosit.   Ambulation/Gait Ambulation/Gait Assistance: 4: Min assist Ambulation Distance (Feet): 60 Feet Assistive device: Rolling walker Ambulation/Gait Assistance Details: pt moves slowly and shuffles.  pt fatigues easily.   Gait Pattern: Step-through pattern;Decreased stride length;Shuffle;Trunk flexed Stairs: No Wheelchair Mobility Wheelchair Mobility: No Modified Rankin (Stroke Patients Only) Pre-Morbid Rankin Score: Slight disability Modified Rankin: Moderately severe disability    Exercises     PT Diagnosis:    PT Problem List:   PT Treatment Interventions:     PT Goals (current goals can now be found in the care plan section) Acute Rehab PT Goals Patient Stated Goal: to go home Time For Goal Achievement: 02/10/13 Potential to Achieve Goals: Good  Visit Information  Last PT Received On: 01/30/13 Assistance Needed: +1 History of Present Illness: pt presents with gait difficulties and visual changes and recent hx of CVA.      Subjective Data  Patient Stated Goal: to go home   Cognition  Cognition Arousal/Alertness: Awake/alert Behavior During Therapy: Flat affect Overall Cognitive Status: History of cognitive impairments - at baseline Area of Impairment: Memory Memory: Decreased short-term memory (From previous CVA)    Balance  Balance Balance Assessed: Yes Static Standing Balance Static Standing - Balance Support: Bilateral upper extremity supported Static Standing - Level of Assistance: 5: Stand by assistance  End of Session PT - End of  Session Equipment Utilized During Treatment: Gait belt Activity Tolerance: Patient limited by fatigue;Patient limited by pain Patient left: in chair;with call bell/phone within reach;with family/visitor present Nurse Communication: Mobility status   GP     Sunny Schlein,  [place] [phone] 01/30/2013, 9:23 AM

## 2013-01-30 NOTE — Progress Notes (Signed)
Occupational Therapy Treatment Patient Details Name: [name] MRN: [id number] DOB: 1923-01-05 Today's Date: 01/30/2013 Time: 4098-1191 OT Time Calculation (min): 18 min  OT Assessment / Plan / Recommendation  History of present illness pt presents with gait difficulties and visual changes and recent hx of CVA.     OT comments  Pt. Awake and visiting with dtr. Upon arrival.  Pt. And dtr. Report son is currently visiting snf for pt. For short term placement.  State they decided would be best for pt. Secondary to she will not have the 24/7 assist she needs at home.                           Frequency Min 3X/week   Progress towards OT Goals Progress towards OT goals: Progressing toward goals  Plan Discharge plan needs to be updated;Other (comment) (pts. family has decided on short term snf placement)    Precautions / Restrictions Precautions Precautions: Fall   Pertinent Vitals/Pain No pain, "just stiff from sitting too long"    ADL  Lower Body Bathing: Performed;Supervision/safety Where Assessed - Lower Body Bathing: Supported sitting;Lean right and/or left Upper Body Dressing: Performed;Min guard;Minimal assistance Where Assessed - Upper Body Dressing: Unsupported sitting Toilet Transfer: Performed;Min guard Toilet Transfer Method: Sit to Barista: Regular height toilet;Grab bars Toileting - Clothing Manipulation and Hygiene: Performed;Min guard Where Assessed - Glass blower/designer Manipulation and Hygiene: Standing Transfers/Ambulation Related to ADLs: amb. slow, min cues for hand positioning on RW and for visual scanning to right secondary to sig. deficits ADL Comments: requested and initiated lb peri care/hygiene    OT Goals(current goals can now be found in the care plan section) ADL Goals Pt Will Perform Grooming: with supervision;standing Pt Will Transfer to Toilet: with supervision;ambulating;bedside commode Pt Will Perform Toileting -  Clothing Manipulation and hygiene: with supervision;sit to/from stand Additional ADL Goal #1: Pt will identify 3/3 objects in R visual field with min vc Additional ADL Goal #2: Pt will complete functional mobility for ADL @ RW level @ S level.  Visit Information  Last OT Received On: 01/30/13 History of Present Illness: pt presents with gait difficulties and visual changes and recent hx of CVA.                   Cognition  Cognition Arousal/Alertness: Awake/alert Behavior During Therapy: WFL for tasks assessed/performed Overall Cognitive Status: History of cognitive impairments - at baseline Area of Impairment: Memory Memory: Decreased short-term memory    Mobility  Bed Mobility Details for Bed Mobility Assistance: able to return back to bed with min inst. cues for tech.  able to bring b les into bed without any assistance, requests hob be elevated prior to lying back secondary to does not like to lay flat Transfers Transfers: Sit to Stand;Stand to Sit Sit to Stand: 4: Min guard;4: Min assist;From chair/3-in-1;From toilet Stand to Sit: 4: Min guard;To toilet;To bed               End of Session OT - End of Session Equipment Utilized During Treatment: Rolling walker Activity Tolerance: Patient tolerated treatment well Patient left: in bed;with call bell/phone within reach;with family/visitor present       Robet Leu, COTA/L 01/30/2013, 3:07 PM

## 2013-01-30 NOTE — Progress Notes (Signed)
Stroke Team Progress Note  HISTORY  [name] is an 79 y.o. female with history significant for diabetes mellitus, hypertension, hyperlipidemia, atrial fibrillation on anticoagulation, and recent stroke Nov 2014 at outside facility whereby she is currently receiving Home Health PT/OT and Speech Therapy. She presented to [facility] ED with new onset gait instability, visual changes, and ringing in the ears when she woke up this yesterday (01/25/2013). She was last known well at about 8:30 the previous night. She was on apixaban and aspirin at the time. CT scan of her head showed evidence of acute stroke involving the left occipital region with hemorrhagic involvement. There was no mass effect. She was subsequently transferred to [facility] for further management. NIH stroke score was 3. Patient was not a TPA candidate secondary to recent stroke on apixiban. She was admitted for further evaluation and treatment. Review of outside imaging shows left PCA embolic event thus has delayed hemorrhagic transformation as she is now several weeks post acute cerebral infarction.  SUBJECTIVE No family at bedside but pt states son is "in the bathroom". States that she is feeling "alright".   OBJECTIVE Most recent Vital Signs: Filed Vitals:   01/29/13 2221 01/30/13 0100 01/30/13 0600 01/30/13 0735  BP: 132/60 133/36 168/58 148/60  Pulse: 64 59 51   Temp:  97.9 F (36.6 C) 97.9 F (36.6 C)   TempSrc:  Oral Oral   Resp:  18 18   Height:      Weight:      SpO2:  98% 95%     IV Fluid Intake:      MEDICATIONS  . amLODipine  5 mg Oral Daily  . lisinopril  20 mg Oral Daily  . metoprolol tartrate  25 mg Oral BID  . pantoprazole  40 mg Oral Daily  . senna-docusate  1 tablet Oral BID  . sertraline  25 mg Oral Daily   PRN:  acetaminophen, acetaminophen, hydrALAZINE  Diet:  Cardiac thin liquids Activity:  Up with assistance DVT Prophylaxis:  SCDs  CLINICALLY SIGNIFICANT  STUDIES BMET    Component Value Date/Time   NA 138 01/28/2013 0530   NA 132* 08/27/2011   K 3.7 01/28/2013 0530   CL 101 01/28/2013 0530   CO2 30 01/28/2013 0530   GLUCOSE 117* 01/28/2013 0530   BUN 13 01/28/2013 0530   BUN 26* 08/27/2011   CREATININE 0.62 01/28/2013 0530   CREATININE 0.7 08/27/2011   CALCIUM 9.4 01/28/2013 0530   GFRNONAA 78* 01/28/2013 0530   GFRAA >90 01/28/2013 0530    CBC    Component Value Date/Time   WBC 9.8 07/26/2012 1713   RBC 4.32 07/26/2012 1713   HGB 13.0 07/26/2012 1713   HCT 38.7 07/26/2012 1713   PLT 260.0 07/26/2012 1713   MCV 89.6 07/26/2012 1713   MCHC 33.7 07/26/2012 1713   RDW 14.3 07/26/2012 1713   LYMPHSABS 1.8 07/26/2012 1713   MONOABS 1.0 07/26/2012 1713   EOSABS 0.1 07/26/2012 1713   BASOSABS 0.0 07/26/2012 1713     Coagulation:   Recent Labs Lab 01/26/13 1545  LABPROT 14.2  INR 1.12   Lipid Panel    Component Value Date/Time   CHOL 206* 12/06/2012 0814   TRIG 71.0 12/06/2012 0814   HDL 94.80 12/06/2012 0814   CHOLHDL 2 12/06/2012 0814   VLDL 14.2 12/06/2012 0814   HgbA1C  Lab Results  Component Value Date   HGBA1C 6.5 12/06/2012    CT of the brain  :Acute  left PCA infarct with hemorrhagic transformation. This is  similar to the recent study.  Chronic infarct left frontal lobe.    MRI of the brain    MRA of the brain    2D Echocardiogram    Carotid Doppler    CXR    EKG     Therapy Recommendations SNF vs 24 hour HH (pts son would like to take her home)  Physical Exam: General: Well-developed, well-nourished, in no acute distress; sitting in bedside recliner Head: Normocephalic, atraumatic. Eyes: PERRLA, EOMI Lungs: Normal respiratory effort. Clear to auscultation bilaterally . Heart: irregular rate and rhythm, SEM appreciated. Abdomen: BS normoactive. Soft, Nondistended, non-tender. No masses or organomegaly appreciated. Extremities: No pretibial edema, distal pulses intact Neurologic:  Mental  Status: Alert, oriented to self and place, thought content appropriate.  Speech fluent without evidence of aphasia.  Able to follow 3 step commands without difficulty. CRANIAL NERVE: pupils equal and reactive to light, RIGHT HOMONYMOUS HEMIANOPSIA. Extraocular muscles intact, no nystagmus, facial sensation and strength symmetric, hearing intact, tongue midline.  MOTOR: normal bulk and tone, full strength in the BUE, LLE; Right leg limited by knee discomfort SENSORY: normal and symmetric to light touch  COORDINATION: finger-nose-finger normal     ASSESSMENT Ms. [name] is a 79 y.o. female presenting with gait instability, visual changes, and ear ringing. Recent CVA Nov 2014.  Outside CT Imaging confirms a left occipital lobe infarct with hemorrhagic transformation. Infarct felt to be embolic secondary to atrial fibrillation. On apixiban prior to admission. Patient with resultant right visual field defect.   Recent embolic left PCA infarct in November 2014 now with hemorrhagic transformation  Atrial fibrillation on apixaban (held)  Hypertension  PVD  Diabetes mellitus with neuropathy  Depression  Hospital day # 4  TREATMENT/PLAN  Goal SBP < 160; will restart lisinopril and use hydralazine prn (patient was too sensitive to IV labetalol)  Repeat CT head in 1 week; if isodense hemorrhage findings consider aspirin; may reconsider apixaban therapy in future  Disposition --> home with home health agency, PT, nursing, and family support    Delia Heady, MD

## 2013-01-31 ENCOUNTER — Inpatient Hospital Stay (HOSPITAL_COMMUNITY): Payer: Medicare Other

## 2013-01-31 MED ORDER — APIXABAN 2.5 MG PO TABS: 2.5000 mg | ORAL_TABLET | Freq: Two times a day (BID) | ORAL | Status: DC

## 2013-01-31 MED ORDER — AMLODIPINE BESYLATE 5 MG PO TABS: 5.0000 mg | ORAL_TABLET | Freq: Every day | ORAL | Status: DC

## 2013-01-31 MED ORDER — PANTOPRAZOLE SODIUM 40 MG PO TBEC: 40.0000 mg | DELAYED_RELEASE_TABLET | Freq: Every day | ORAL | Status: DC

## 2013-01-31 MED ORDER — SENNOSIDES-DOCUSATE SODIUM 8.6-50 MG PO TABS: 1.0000 | ORAL_TABLET | Freq: Two times a day (BID) | ORAL | Status: DC

## 2013-01-31 MED ORDER — LISINOPRIL 20 MG PO TABS: 20.0000 mg | ORAL_TABLET | Freq: Every day | ORAL | Status: DC

## 2013-01-31 MED ORDER — IBUPROFEN 400 MG PO TABS: 400.0000 mg | ORAL_TABLET | Freq: Four times a day (QID) | ORAL | Status: DC | PRN

## 2013-01-31 MED ORDER — IBUPROFEN 400 MG PO TABS
400.0000 mg | ORAL_TABLET | Freq: Four times a day (QID) | ORAL | Status: DC | PRN
  Administered 2013-01-31: 400 mg via ORAL
  Filled 2013-01-31 (×2): qty 1

## 2013-01-31 NOTE — Progress Notes (Signed)
Report called to [facility] ? ?

## 2013-01-31 NOTE — Clinical Social Work Note (Addendum)
2:01pm- dc order has been placed.  FL2 needs signature.  PA paged.    CSW was informed by RN that pt family would like for pt to go to [facility].  Weekend CSW completed assessment and sent clinicals to [facility] as a back up to home health/CIR.  CSW informed RN that MD would need to complete dc summary and CSW will assist with transition to SNF.  CSW confirmed with SNF that pt is able to be admitted today.  SNF is ready for pt.  Family has already completed paperwork.    Vickii Penna, LCSWA [phone]  Clinical Social Work

## 2013-01-31 NOTE — Clinical Social Work Note (Signed)
CSW contacted daughter, Lurena Joiner and pt son to inform them both that pt will be dc today to [facility], SNF.  Daughter and son are both agreeable.  Pt will be transported by non-emergent EMS ([facility]).    Vickii Penna, LCSWA [phone]  Clinical Social Work

## 2013-01-31 NOTE — Discharge Summary (Signed)
Physician Discharge Summary  Patient ID: [name] MRN: [id number] DOB/AGE: 03-07-1922 79 y.o.  Admit date: 01/26/2013 Discharge date: 01/31/2013  Admission Diagnoses: Hemorrhagic transformation of a subacute left posterior cerebral artery embolic infarct on anticoagulation with Apixaban with atrial fibrillation  Discharge Diagnoses:  Active Problems:   Atrial fibrillation   Essential hypertension, benign   CAD (coronary artery disease)   Peripheral vascular disease   Diabetes   Hypercholesterolemia   Depression   ICH (intracerebral hemorrhage) on anticoagulation, after recent LPCA infarct   Discharged Condition: stable  Hospital Course: [name] is an 79 y.o. female with history significant for diabetes mellitus, hypertension, hyperlipidemia, atrial fibrillation on anticoagulation, and recent stroke Nov 2014 at outside facility whereby she was receiving Home Health PT/OT and Speech Therapy. She presented to [facility] ED with new onset gait instability, visual changes, and ringing in the ears when she woke up (01/25/2013). She was last known well at about 8:30 the previous night. She was on apixaban and aspirin at the time. CT scan of her head showed evidence of acute stroke involving the left occipital region with hemorrhagic involvement. There was no mass effect. She was subsequently transferred to [facility] for further management. NIH stroke score was 3. Patient was not a TPA candidate secondary to recent stroke on apixiban 5 mg twice daily. She was admitted for further evaluation and treatment. Review of outside imaging showed left PCA embolic event thus had delayed hemorrhagic transformation as she is now several weeks post acute cerebral infarction. She was monitored over the course of her hospitalization and continued PT/OT/ and Speech Therapy with recommendation for short-term Skilled Nursing Facility Placement.  Apixiban was held until Ct imagining showed  iso-dense findings to indicate resolution of cerebral hemorrhage. She will be resumed on Apixiban 2.5 mg bid on 02/01/2013.   Consults: Physical Therapy, Occupational Therapy, Speech Therapy  Significant Diagnostic Studies: radiology: Ct Scan Head wo Contrast 01/31/2013 FINDINGS:  No new or significant osseous findings. Bilateral cataract  resection. No acute orbital abnormality.  Edema admixed with hemorrhage in the inferior and medial left  occipital lobe shows no significant change. Infarct in the left  thalamus is also stable. No evidence of new major vessel infarct.  Remote left frontal operculum infarct. Cerebral atrophy. Extensive  intracranial atherosclerosis.  IMPRESSION:  Hemorrhagic infarct in the left PCA territory. No increase since  01/26/2013.  01/28/2013 Ct Scan Head wo Contrast FINDINGS:  Left PCA territory infarct has low-density as well as high density  areas consistent with hemorrhagic transformation of acute  infarction. This involves some of the hippocampus extending into the  left occipital lobe.  Chronic infarct left frontal lobe.  No other acute infarct. No subarachnoid or ventricular hemorrhage.  Ventricles are normal in size. No midline shift.  IMPRESSION:  Acute left PCA infarct with hemorrhagic transformation. This is  similar to the recent study.  Chronic infarct left frontal lobe.  Discharge Exam: Blood pressure 127/36, pulse 61, temperature 98.2 F (36.8 C), temperature source Oral, resp. rate 20, height 5\' 4"  (1.626 m), weight 138 lb 14.2 oz (63 kg), SpO2 99.00%. Physical Exam: General: Well-developed, well-nourished, in no acute distress; sitting in bedside recliner, son at bedside Head: Normocephalic, atraumatic. Eyes: PERRLA, EOMI Lungs: Normal respiratory effort. Clear to auscultation bilaterally . Heart: irregular rate and rhythm, SEM appreciated. Abdomen: BS normoactive. Soft, Nondistended, non-tender. No masses or organomegaly  appreciated. Extremities: No pretibial edema, distal pulses intact Neurologic:  Mental Status:  Alert, oriented  to self and place, thought content appropriate. Speech fluent without evidence of aphasia. Able to follow 3 step commands without difficulty.  CRANIAL NERVE: pupils equal and reactive to light, RIGHT HOMONYMOUS HEMIANOPSIA. Extraocular muscles intact, no nystagmus, facial sensation and strength symmetric, hearing intact, tongue midline.  MOTOR: normal bulk and tone, full strength in the BUE, LLE; Right leg limited by knee discomfort  SENSORY: normal and symmetric to light touch  COORDINATION: finger-nose-finger normal    Disposition:       Discharge Orders   Future Appointments Provider Department Dept Phone   03/31/2013 9:15 AM Charm Barges, MD [facility] [phone]   Future Orders Complete By Expires   Diet - low sodium heart healthy  As directed    Discharge instructions  As directed    Comments:     Start Eliquis tomorrow 02/01/2013.   Increase activity slowly  As directed        Medication List    STOP taking these medications       aspirin 325 MG EC tablet     hydrochlorothiazide 25 MG tablet  Commonly known as:  HYDRODIURIL      TAKE these medications       amLODipine 5 MG tablet  Commonly known as:  NORVASC  Take 1 tablet (5 mg total) by mouth daily.     apixaban 2.5 MG Tabs tablet  Commonly known as:  ELIQUIS  Take 1 tablet (2.5 mg total) by mouth 2 (two) times daily.  Start taking on:  02/01/2013     atorvastatin 20 MG tablet  Commonly known as:  LIPITOR  Take 20 mg by mouth daily.     glucose blood test strip  1 each by Other route as needed for other. Use one test strip bid     ibuprofen 400 MG tablet  Commonly known as:  ADVIL,MOTRIN  Take 1 tablet (400 mg total) by mouth every 6 (six) hours as needed for mild pain.     lisinopril 20 MG tablet  Commonly known as:  PRINIVIL,ZESTRIL  Take 1 tablet (20 mg  total) by mouth daily.     metoprolol tartrate 25 MG tablet  Commonly known as:  LOPRESSOR  Take 25 mg by mouth 2 (two) times daily.     pantoprazole 40 MG tablet  Commonly known as:  PROTONIX  Take 1 tablet (40 mg total) by mouth daily.     senna-docusate 8.6-50 MG per tablet  Commonly known as:  Senokot-S  Take 1 tablet by mouth 2 (two) times daily.     sertraline 25 MG tablet  Commonly known as:  ZOLOFT  Take 1 tablet (25 mg total) by mouth daily.       Follow-up Information   Follow up with Gates Rigg, MD. Schedule an appointment as soon as possible for a visit in 2 months.   Specialties:  Neurology, Radiology   Contact information:   [address] [place] [address] [phone]       Signed: Kristie Cowman 01/31/2013, 1:22 PM  I have personally examined this patient, reviewed pertinent data, developed plan of care and agree with above Delia Heady, MD

## 2013-01-31 NOTE — Progress Notes (Signed)
Stroke Team Progress Note  HISTORY  [name] is an 79 y.o. female with history significant for diabetes mellitus, hypertension, hyperlipidemia, atrial fibrillation on anticoagulation, and recent stroke Nov 2014 at outside facility whereby she is currently receiving Home Health PT/OT and Speech Therapy. She presented to [facility] ED with new onset gait instability, visual changes, and ringing in the ears when she woke up this yesterday (01/25/2013). She was last known well at about 8:30 the previous night. She was on apixaban and aspirin at the time. CT scan of her head showed evidence of acute stroke involving the left occipital region with hemorrhagic involvement. There was no mass effect. She was subsequently transferred to [facility] for further management. NIH stroke score was 3. Patient was not a TPA candidate secondary to recent stroke on apixiban. She was admitted for further evaluation and treatment. Review of outside imaging shows left PCA embolic event thus has delayed hemorrhagic transformation as she is now several weeks post acute cerebral infarction.  SUBJECTIVE Son at bedside. No complaints.   OBJECTIVE Most recent Vital Signs: Filed Vitals:   01/31/13 0219 01/31/13 0540 01/31/13 0646 01/31/13 0908  BP: 138/61 169/72 152/70 166/54  Pulse: 63 57  73  Temp: 98.2 F (36.8 C) 98.4 F (36.9 C)  98.1 F (36.7 C)  TempSrc: Oral Oral  Oral  Resp: 17 17  20   Height:      Weight:      SpO2: 95% 96%  99%    IV Fluid Intake:      MEDICATIONS  . amLODipine  5 mg Oral Daily  . lisinopril  20 mg Oral Daily  . metoprolol tartrate  25 mg Oral BID  . pantoprazole  40 mg Oral Daily  . senna-docusate  1 tablet Oral BID  . sertraline  25 mg Oral Daily   PRN:  acetaminophen, acetaminophen, hydrALAZINE, ibuprofen  Diet:  Cardiac thin liquids Activity:  Up with assistance DVT Prophylaxis:  SCDs  CLINICALLY SIGNIFICANT STUDIES BMET    Component Value Date/Time    NA 138 01/28/2013 0530   NA 132* 08/27/2011   K 3.7 01/28/2013 0530   CL 101 01/28/2013 0530   CO2 30 01/28/2013 0530   GLUCOSE 117* 01/28/2013 0530   BUN 13 01/28/2013 0530   BUN 26* 08/27/2011   CREATININE 0.62 01/28/2013 0530   CREATININE 0.7 08/27/2011   CALCIUM 9.4 01/28/2013 0530   GFRNONAA 78* 01/28/2013 0530   GFRAA >90 01/28/2013 0530    CBC    Component Value Date/Time   WBC 9.8 07/26/2012 1713   RBC 4.32 07/26/2012 1713   HGB 13.0 07/26/2012 1713   HCT 38.7 07/26/2012 1713   PLT 260.0 07/26/2012 1713   MCV 89.6 07/26/2012 1713   MCHC 33.7 07/26/2012 1713   RDW 14.3 07/26/2012 1713   LYMPHSABS 1.8 07/26/2012 1713   MONOABS 1.0 07/26/2012 1713   EOSABS 0.1 07/26/2012 1713   BASOSABS 0.0 07/26/2012 1713     Coagulation:   Recent Labs Lab 01/26/13 1545  LABPROT 14.2  INR 1.12   Lipid Panel    Component Value Date/Time   CHOL 206* 12/06/2012 0814   TRIG 71.0 12/06/2012 0814   HDL 94.80 12/06/2012 0814   CHOLHDL 2 12/06/2012 0814   VLDL 14.2 12/06/2012 0814   HgbA1C  Lab Results  Component Value Date   HGBA1C 6.5 12/06/2012    CT of the brain 01/26/2013 :Acute left PCA infarct with hemorrhagic transformation. This is  similar to the recent study.  Chronic infarct left frontal lobe.   CT of the brain 01/31/2013 Hemorrhagic infarct in the left PCA territory. No increase since  01/26/2013.   MRI of the brain    MRA of the brain    2D Echocardiogram    Carotid Doppler    CXR    EKG     Therapy Recommendations SNF vs 24 hour HH (pts son would like short-term SNF)  Physical Exam: General: Well-developed, well-nourished, in no acute distress; sitting in bedside recliner Head: Normocephalic, atraumatic. Eyes: PERRLA, EOMI Lungs: Normal respiratory effort. Clear to auscultation bilaterally . Heart: irregular rate and rhythm, SEM appreciated. Abdomen: BS normoactive. Soft, Nondistended, non-tender. No masses or organomegaly  appreciated. Extremities: No pretibial edema, distal pulses intact Neurologic:  Mental Status: Alert, oriented to self and place, thought content appropriate.  Speech fluent without evidence of aphasia.  Able to follow 3 step commands without difficulty. CRANIAL NERVE: pupils equal and reactive to light, RIGHT HOMONYMOUS HEMIANOPSIA. Extraocular muscles intact, no nystagmus, facial sensation and strength symmetric, hearing intact, tongue midline.  MOTOR: normal bulk and tone, full strength in the BUE, LLE; Right leg limited by knee discomfort SENSORY: normal and symmetric to light touch  COORDINATION: finger-nose-finger normal     ASSESSMENT Ms. [name] is a 79 y.o. female presenting with gait instability, visual changes, and ear ringing. Recent CVA Nov 2014.  Outside CT Imaging confirms a left occipital lobe infarct with hemorrhagic transformation. Infarct felt to be embolic secondary to atrial fibrillation. On apixiban prior to admission. Patient with resultant right visual field defect. Repeat CT head with some resolution of hemorrhage but not yet iso-dense.   Recent embolic left PCA infarct in 01/02/2013 admitted 01/26/2013 with hemorrhagic transformation  Atrial fibrillation on apixaban (held) until  Isodense findings on CT head  Hypertension  PVD  Diabetes mellitus with neuropathy  Depression  Hospital day # 5  TREATMENT/PLAN  Goal SBP < 160; cont  lisinopril and use hydralazine prn (patient was too sensitive to IV labetalol)  Continue aspirin tx for now with plan to resume Eliquis (2.5 mg bid)  tomorrow  Disposition --> pt family changed their mind about dc home, now prefer short-term SNF Environmental consultant) per SW bed available today   Delia Heady, MD

## 2013-01-31 NOTE — Progress Notes (Signed)
Physical Therapy Treatment Patient Details Name: [name] MRN: [id number] DOB: [birth year] Today's Date: 01/31/2013 Time: 0800-0829 PT Time Calculation (min): 29 min  PT Assessment / Plan / Recommendation  History of Present Illness pt presents with gait difficulties and visual changes and recent hx of CVA.     PT Comments   Patient progressing with ambulation. Family trying to find 24 hour assistance for home if unable will need to continue to consider STSNF for ongoing therapy and safety for patient  Follow Up Recommendations  Home health PT;Supervision/Assistance - 24 hour     Does the patient have the potential to tolerate intense rehabilitation     Barriers to Discharge        Equipment Recommendations  None recommended by PT    Recommendations for Other Services    Frequency Min 4X/week   Progress towards PT Goals Progress towards PT goals: Progressing toward goals  Plan Current plan remains appropriate    Precautions / Restrictions Precautions Precautions: Fall   Pertinent Vitals/Pain no apparent distress     Mobility  Bed Mobility Supine to Sit: 5: Supervision;HOB elevated Sitting - Scoot to Edge of Bed: 5: Supervision Transfers Sit to Stand: 4: Min guard;4: Min assist;From toilet;From bed Stand to Sit: 4: Min guard;To toilet;To chair/3-in-1 Details for Transfer Assistance: Cues for hand placement and controlled sitting Ambulation/Gait Ambulation/Gait Assistance: 4: Min assist Ambulation Distance (Feet): 100 Feet Assistive device: Rolling walker Ambulation/Gait Assistance Details: Continues to move Gait Pattern: Step-through pattern;Decreased stride length;Shuffle;Trunk flexed Gait velocity: decreased    Exercises     PT Diagnosis:    PT Problem List:   PT Treatment Interventions:     PT Goals (current goals can now be found in the care plan section)    Visit Information  Last PT Received On: 01/31/13 Assistance Needed: +1 History of Present  Illness: pt presents with gait difficulties and visual changes and recent hx of CVA.      Subjective Data      Cognition  Cognition Arousal/Alertness: Awake/alert Overall Cognitive Status: History of cognitive impairments - at baseline    Balance  Dynamic Standing Balance Dynamic Standing - Balance Support: No upper extremity supported Dynamic Standing - Level of Assistance: 4: Min assist Dynamic Standing - Balance Activities: Reaching for objects;Reaching across midline;Forward lean/weight shifting;Lateral lean/weight shifting Dynamic Standing - Comments: Patient stood at sink for hygiene task, requiring Min A at times for balance  End of Session PT - End of Session Equipment Utilized During Treatment: Gait belt Activity Tolerance: Patient tolerated treatment well Patient left: in chair;with call bell/phone within reach Nurse Communication: Mobility status   GP     Fredrich Birks 01/31/2013, 9:22 AM  01/31/2013 Fredrich Birks PTA [phone] pager [phone] office

## 2013-02-03 ENCOUNTER — Ambulatory Visit: Payer: Medicare Other | Admitting: Internal Medicine

## 2013-02-17 ENCOUNTER — Telehealth: Payer: Self-pay | Admitting: *Deleted

## 2013-02-17 NOTE — Telephone Encounter (Signed)
Daughter notified 

## 2013-02-17 NOTE — Telephone Encounter (Signed)
Daughter called to report that her mother ([name]) BP was 201/90 & wants to know if she needs to take a 2nd BP pill. She also reports that her vision is blurred. Please advise

## 2013-02-17 NOTE — Telephone Encounter (Signed)
With her history of recent CVA - given increased blood pressure and blurred vision - she needs evaluation.  I recommend to ER for evaluation to confirm not having another neurological event.

## 2013-02-21 ENCOUNTER — Telehealth: Payer: Self-pay | Admitting: Internal Medicine

## 2013-02-21 NOTE — Telephone Encounter (Signed)
I spoke with [name] daughter & advised her again that she needs to go to the ER ASAP to get her BP under control. Then we can get her back in the office for a follow-up. Daughter verbalized understanding

## 2013-02-21 NOTE — Telephone Encounter (Signed)
Richelle Ito to pts daughter and Ms Sida.  Ms Bucks is feeling better.  Calming down.  Gets anxious when people come to her house.  Her pressure now is 174/68 with pulse 111.  No other acute symptoms currently.  Her daughter had given her the other 1/2 40mg  lisinopril a few hours ago.  Desires no evaluation tonight.  Did agree to office visit tomorrow.  I will se her at 12:45 - 1:00 tomorrow for work in.  Please add her to my schedule.  Thanks.

## 2013-02-21 NOTE — Telephone Encounter (Signed)
error 

## 2013-02-21 NOTE — Telephone Encounter (Signed)
If pressure is running 200's especially with recent CVA and previous documented blurred vision - she needs to be seen today.  I recommend to ER for evaluation and then I can f/u after.

## 2013-02-21 NOTE — Telephone Encounter (Signed)
Spoke with patient & her daughter was outside. She states that she will have her call me back when she comes back inside

## 2013-02-21 NOTE — Telephone Encounter (Signed)
I spoke with [name] with [facility] [phone] feels that she needs to be seen ASAP.  I informed her that her daughter was advised on Friday to go to the ER. Please advise (see below)

## 2013-02-22 ENCOUNTER — Encounter: Payer: Self-pay | Admitting: Internal Medicine

## 2013-02-22 ENCOUNTER — Ambulatory Visit (INDEPENDENT_AMBULATORY_CARE_PROVIDER_SITE_OTHER): Payer: Medicare Other | Admitting: Internal Medicine

## 2013-02-22 ENCOUNTER — Encounter (INDEPENDENT_AMBULATORY_CARE_PROVIDER_SITE_OTHER): Payer: Self-pay

## 2013-02-22 VITALS — BP 160/70 | HR 89 | Temp 98.2°F | Ht 66.0 in | Wt 132.5 lb

## 2013-02-22 DIAGNOSIS — I4891 Unspecified atrial fibrillation: Secondary | ICD-10-CM

## 2013-02-22 DIAGNOSIS — F329 Major depressive disorder, single episode, unspecified: Secondary | ICD-10-CM

## 2013-02-22 DIAGNOSIS — F32A Depression, unspecified: Secondary | ICD-10-CM

## 2013-02-22 DIAGNOSIS — F3289 Other specified depressive episodes: Secondary | ICD-10-CM

## 2013-02-22 DIAGNOSIS — G629 Polyneuropathy, unspecified: Secondary | ICD-10-CM

## 2013-02-22 DIAGNOSIS — I10 Essential (primary) hypertension: Secondary | ICD-10-CM

## 2013-02-22 DIAGNOSIS — I2510 Atherosclerotic heart disease of native coronary artery without angina pectoris: Secondary | ICD-10-CM

## 2013-02-22 DIAGNOSIS — G589 Mononeuropathy, unspecified: Secondary | ICD-10-CM

## 2013-02-22 MED ORDER — SERTRALINE HCL 25 MG PO TABS: 25.0000 mg | ORAL_TABLET | Freq: Every day | ORAL | Status: DC

## 2013-02-22 NOTE — Telephone Encounter (Signed)
Pt on schedule for today @ 12:45

## 2013-02-22 NOTE — Progress Notes (Signed)
Pre-visit discussion using our clinic review tool. No additional management support is needed unless otherwise documented below in the visit note.  

## 2013-02-26 ENCOUNTER — Encounter: Payer: Self-pay | Admitting: Internal Medicine

## 2013-02-26 NOTE — Progress Notes (Signed)
Subjective:    Patient ID: [name], female    DOB: 1922/04/23, 80 y.o.   MRN: [id number]  Hypertension  80 year old female with past history of CAD s/p bypass in 10/99.  She also has a history of hypertension, hypercholesterolemia, atrial fibrillation and previous presumed TIA/CVA (2008).  Was recently admitted with CVA.  Was started on Eloquis.  Was in rehab for a short period. At home now with home health.  Has not started physical therapy at her request.   She comes in today as a work in with concerns regarding elevated blood pressure.   She states she has been doing relatively well.  The home health nurse called yesterday with concerns regarding elevated blood pressure.  see phone message for details.  Pt states she just gets anxious when they come to her home.  I spoke with her daughter yesterday and it was decided to have her increase her lisinopril to 20mg  bid.  Blood pressure improved last night.  Pt feels better.  She is accompanied by her daughter today.  History obtained from both of them.  They brought in an automated blood pressure cuff and her cuff correlates.      Past Medical History  Diagnosis Date  . Type II or unspecified type diabetes mellitus without mention of complication, not stated as uncontrolled   . Essential hypertension, benign   . Anemia, unspecified   . Atrial fibrillation   . Hypercholesterolemia   . Osteoarthritis   . History of TIAs   . CVA (cerebral infarction)   . Atrial fibrillation   . Uterine cancer     s/p TAH/BSO  . CAD (coronary artery disease)     s/p CABG 10/99 with LIMA to the LAD, SVG to OM1, SVG to distal RCA    Outpatient Encounter Prescriptions as of 02/22/2013  Medication Sig  . amLODipine (NORVASC) 5 MG tablet Take 1 tablet (5 mg total) by mouth daily.  Marland Kitchen apixaban (ELIQUIS) 2.5 MG TABS tablet Take 1 tablet (2.5 mg total) by mouth 2 (two) times daily.  Marland Kitchen glucose blood test strip 1 each by Other route as needed for other. Use one test  strip bid  . lisinopril (PRINIVIL,ZESTRIL) 20 MG tablet Take 20 mg by mouth 2 (two) times daily.  . metoprolol tartrate (LOPRESSOR) 25 MG tablet Take 25 mg by mouth 2 (two) times daily.  Marland Kitchen senna-docusate (SENOKOT-S) 8.6-50 MG per tablet Take 1 tablet by mouth 2 (two) times daily.  . [DISCONTINUED] atorvastatin (LIPITOR) 20 MG tablet Take 20 mg by mouth daily.  . [DISCONTINUED] lisinopril (PRINIVIL,ZESTRIL) 20 MG tablet Take 1 tablet (20 mg total) by mouth daily.  . sertraline (ZOLOFT) 25 MG tablet Take 1 tablet (25 mg total) by mouth daily.  . [DISCONTINUED] ibuprofen (ADVIL,MOTRIN) 400 MG tablet Take 1 tablet (400 mg total) by mouth every 6 (six) hours as needed for mild pain.  . [DISCONTINUED] pantoprazole (PROTONIX) 40 MG tablet Take 1 tablet (40 mg total) by mouth daily.  . [DISCONTINUED] sertraline (ZOLOFT) 25 MG tablet Take 1 tablet (25 mg total) by mouth daily.    Review of Systems Patient denies any headache, lightheadedness or dizziness.  No sinus or allergy symptoms.  No chest pain, tightness or palpitations.  No increased shortness of breath, cough or congestion.  No nausea or vomiting.  No abdominal pain or cramping.  No bowel change, such as diarrhea, constipation, BRBPR or melana.  No urine change.  Blood pressure improved today.  Overall  she feels better.  Some increased anxiety.  Was questioning starting something for this.  Also, has been having some increased burning sensation in her feet.           Objective:   Physical Exam  Filed Vitals:   02/22/13 1243  BP: 160/70  Pulse: 89  Temp: 98.2 F (36.8 C)   Blood pressure recheck with her machine 164/76, pulse 65  80 year old female in no acute distress.   HEENT:  Nares- clear.  Oropharynx - without lesions. NECK:  Supple.  Nontender.  HEART:  Appears to be regular. LUNGS:  No crackles or wheezing audible.  Respirations even and unlabored.  RADIAL PULSE:  Equal bilaterally.   ABDOMEN:  Soft, nontender.  Bowel sounds  present and normal.  No audible abdominal bruit.   EXTREMITIES:  Pedal edema present.  Improved.       Assessment & Plan:  LEG PAIN AND WEAKNESS.   Using her walker.  Discussed starting home physical therapy.    HEALTH MAINTENANCE.  Has declined mammogram and further GI evaluation.  Declines bone density.

## 2013-02-26 NOTE — Assessment & Plan Note (Signed)
Blood pressure elevated.  Just increased lisinopril to 20mg  bid.  Her cuff correlates.  Follow.

## 2013-02-26 NOTE — Assessment & Plan Note (Signed)
Has known disease.  Currently asymptomatic.   Feels she is doing well from a cardiac standpoint.  Follow.    

## 2013-02-26 NOTE — Assessment & Plan Note (Signed)
She is on eloquis now.  Rate controlled.  Follow.

## 2013-02-26 NOTE — Assessment & Plan Note (Signed)
Will start zoloft.  Discussed neurontin.   Follow.

## 2013-02-26 NOTE — Assessment & Plan Note (Signed)
Depression with some anxiety.  Will restart zoloft.  Follow.

## 2013-03-02 ENCOUNTER — Telehealth: Payer: Self-pay | Admitting: *Deleted

## 2013-03-02 NOTE — Telephone Encounter (Signed)
LMTCB-no form received, asked Coralyn Helling to call or fax something for [place] to sign

## 2013-03-02 NOTE — Telephone Encounter (Signed)
[name], physical therapist with [facility] left a voicemail stating patient certification will expire this week. She needs new order for patient to continue with physical therapy, she has been in the hospital several times and that is why she was not able to complete it. She has had 2 CVAs and this need to be continued.

## 2013-03-02 NOTE — Telephone Encounter (Signed)
Ok to continue.  Do I need to sign a form?

## 2013-03-03 ENCOUNTER — Telehealth: Payer: Self-pay | Admitting: Internal Medicine

## 2013-03-03 ENCOUNTER — Other Ambulatory Visit: Payer: Self-pay | Admitting: *Deleted

## 2013-03-03 MED ORDER — AMLODIPINE BESYLATE 5 MG PO TABS: 5.0000 mg | ORAL_TABLET | Freq: Every day | ORAL | Status: DC

## 2013-03-03 NOTE — Telephone Encounter (Signed)
New Rx sent to [facility]

## 2013-03-03 NOTE — Telephone Encounter (Signed)
Becky Ms Knaggs daughter called about ms [place] rx for amlodipine.  The rx says take 1 tab every day 2.5mg s.  Jacqlyn Larsen stated ms Mickelsen takes 2 pills daily of the 2.5mg .  They needs another refill running out of pills because of the increase in meds  [facility]

## 2013-03-10 ENCOUNTER — Telehealth: Payer: Self-pay | Admitting: Internal Medicine

## 2013-03-10 NOTE — Telephone Encounter (Signed)
Noted.  Just let me know if they need anything from me.

## 2013-03-10 NOTE — Telephone Encounter (Signed)
Ok.  Just let me know if I need to do anything.

## 2013-03-10 NOTE — Telephone Encounter (Signed)
I finally spoke with her this morning. Tried to reach her yesterday, but didn't realized that i had to dial one plus the area code for a "[phone]" number. Verbal order given.

## 2013-03-10 NOTE — Telephone Encounter (Signed)
[name] with [facility] also called to report that [name]'s sister refused OT services

## 2013-03-10 NOTE — Telephone Encounter (Signed)
Home health speech therapist calling for verbal orders for speech therapy 2x week x6 weeks.  Needing verbal order today if possible so that she can see the pt this afternoon.

## 2013-03-27 ENCOUNTER — Telehealth: Payer: Self-pay | Admitting: Internal Medicine

## 2013-03-27 ENCOUNTER — Other Ambulatory Visit: Payer: Self-pay | Admitting: Internal Medicine

## 2013-03-27 NOTE — Telephone Encounter (Signed)
[facility] calling to report pt has elevated BP 184/68.  Want to make Dr. Nicki Reaper aware and see if there are any instructions for the pt.  Pt has appt Friday 2/13, but they would like Dr. Nicki Reaper to be aware of BP.

## 2013-03-28 MED ORDER — HYDROCHLOROTHIAZIDE 12.5 MG PO CAPS: 12.5000 mg | ORAL_CAPSULE | Freq: Every day | ORAL | Status: DC

## 2013-03-28 NOTE — Telephone Encounter (Signed)
BP is still staying up, mostly in the afternoon. She is taking her medication regularly as directed. And she feels the same today as she did yesterday when BP was up. She does feels that anxiety is playing a part in it as well.

## 2013-03-28 NOTE — Telephone Encounter (Signed)
Please call and clarify with pts daughter if they have rechecked her blood pressure.  Need to know how it is running now.  Also make sure taking all medications as instructed.  May need to adjut medications of blood pressure remains elevated.  Sometimes temporarily increases with increased anxiety and then will decrease.

## 2013-03-28 NOTE — Telephone Encounter (Signed)
Spoke to [place] (pts daughter).  Will have her take lisinopril 40mg  q day.  Also change her amlodipine to 5mg  q day.  Continue metoprolol as she is doing.  Start hctz 12.5mg  q day.  Pt worried about her blood pressure, but otherwise states she is doing ok.  Daughter will recheck her pressure later tonight.  States pt more relaxed now.  Just ate.  Will follow.  Keep appt with me on Friday.

## 2013-03-30 ENCOUNTER — Ambulatory Visit: Payer: Medicare Other | Admitting: Internal Medicine

## 2013-03-31 ENCOUNTER — Encounter (INDEPENDENT_AMBULATORY_CARE_PROVIDER_SITE_OTHER): Payer: Self-pay

## 2013-03-31 ENCOUNTER — Encounter: Payer: Self-pay | Admitting: Internal Medicine

## 2013-03-31 ENCOUNTER — Ambulatory Visit (INDEPENDENT_AMBULATORY_CARE_PROVIDER_SITE_OTHER): Payer: Medicare Other | Admitting: Internal Medicine

## 2013-03-31 VITALS — BP 140/80 | HR 95 | Temp 98.2°F | Ht 66.0 in | Wt 134.5 lb

## 2013-03-31 DIAGNOSIS — I639 Cerebral infarction, unspecified: Secondary | ICD-10-CM

## 2013-03-31 DIAGNOSIS — I4891 Unspecified atrial fibrillation: Secondary | ICD-10-CM

## 2013-03-31 DIAGNOSIS — F32A Depression, unspecified: Secondary | ICD-10-CM

## 2013-03-31 DIAGNOSIS — E871 Hypo-osmolality and hyponatremia: Secondary | ICD-10-CM

## 2013-03-31 DIAGNOSIS — I10 Essential (primary) hypertension: Secondary | ICD-10-CM

## 2013-03-31 DIAGNOSIS — E7800 Pure hypercholesterolemia, unspecified: Secondary | ICD-10-CM

## 2013-03-31 DIAGNOSIS — F3289 Other specified depressive episodes: Secondary | ICD-10-CM

## 2013-03-31 DIAGNOSIS — I6350 Cerebral infarction due to unspecified occlusion or stenosis of unspecified cerebral artery: Secondary | ICD-10-CM

## 2013-03-31 DIAGNOSIS — I2510 Atherosclerotic heart disease of native coronary artery without angina pectoris: Secondary | ICD-10-CM

## 2013-03-31 DIAGNOSIS — E119 Type 2 diabetes mellitus without complications: Secondary | ICD-10-CM

## 2013-03-31 DIAGNOSIS — F329 Major depressive disorder, single episode, unspecified: Secondary | ICD-10-CM

## 2013-03-31 DIAGNOSIS — I739 Peripheral vascular disease, unspecified: Secondary | ICD-10-CM

## 2013-03-31 LAB — LIPID PANEL
CHOL/HDL RATIO: 2
CHOLESTEROL: 209 mg/dL — AB (ref 0–200)
HDL: 99.3 mg/dL (ref >39.00)
TRIGLYCERIDES: 85 mg/dL (ref 0.0–149.0)
VLDL: 17 mg/dL (ref 0.0–40.0)

## 2013-03-31 LAB — COMPREHENSIVE METABOLIC PANEL
ALT: 20 U/L (ref 0–35)
AST: 25 U/L (ref 0–37)
Albumin: 4.5 g/dL (ref 3.5–5.2)
Alkaline Phosphatase: 98 U/L (ref 39–117)
BUN: 15 mg/dL (ref 6–23)
CALCIUM: 10.1 mg/dL (ref 8.4–10.5)
CHLORIDE: 96 meq/L (ref 96–112)
CO2: 28 mEq/L (ref 19–32)
Creatinine, Ser: 0.6 mg/dL (ref 0.4–1.2)
GFR: 94.61 mL/min (ref >60.00)
Glucose, Bld: 132 mg/dL — ABNORMAL HIGH (ref 70–99)
Potassium: 4.3 mEq/L (ref 3.5–5.1)
Sodium: 135 mEq/L (ref 135–145)
Total Bilirubin: 0.9 mg/dL (ref 0.3–1.2)
Total Protein: 7.9 g/dL (ref 6.0–8.3)

## 2013-03-31 LAB — URINALYSIS, ROUTINE W REFLEX MICROSCOPIC
BILIRUBIN URINE: NEGATIVE
Hgb urine dipstick: NEGATIVE
Ketones, ur: NEGATIVE
Leukocytes, UA: NEGATIVE
Nitrite: NEGATIVE
PH: 7 (ref 5.0–8.0)
Specific Gravity, Urine: 1.015 (ref 1.000–1.030)
URINE GLUCOSE: NEGATIVE
Urobilinogen, UA: 0.2 (ref 0.0–1.0)

## 2013-03-31 LAB — CBC WITH DIFFERENTIAL/PLATELET
Basophils Absolute: 0 10*3/uL (ref 0.0–0.1)
Basophils Relative: 0.2 % (ref 0.0–3.0)
Eosinophils Absolute: 0.1 10*3/uL (ref 0.0–0.7)
Eosinophils Relative: 0.8 % (ref 0.0–5.0)
HCT: 38.3 % (ref 36.0–46.0)
Hemoglobin: 12.6 g/dL (ref 12.0–15.0)
LYMPHS PCT: 10.9 % — AB (ref 12.0–46.0)
Lymphs Abs: 1.4 10*3/uL (ref 0.7–4.0)
MCHC: 32.9 g/dL (ref 30.0–36.0)
MCV: 89.3 fl (ref 78.0–100.0)
Monocytes Absolute: 1.3 10*3/uL — ABNORMAL HIGH (ref 0.1–1.0)
Monocytes Relative: 10 % (ref 3.0–12.0)
NEUTROS ABS: 9.9 10*3/uL — AB (ref 1.4–7.7)
Neutrophils Relative %: 78.1 % — ABNORMAL HIGH (ref 43.0–77.0)
Platelets: 345 10*3/uL (ref 150.0–400.0)
RBC: 4.29 Mil/uL (ref 3.87–5.11)
RDW: 14.6 % (ref 11.5–14.6)
WBC: 12.6 10*3/uL — ABNORMAL HIGH (ref 4.5–10.5)

## 2013-03-31 LAB — MICROALBUMIN / CREATININE URINE RATIO
CREATININE, U: 113.6 mg/dL
MICROALB UR: 6.5 mg/dL — AB (ref 0.0–1.9)
MICROALB/CREAT RATIO: 5.7 mg/g (ref 0.0–30.0)

## 2013-03-31 LAB — LDL CHOLESTEROL, DIRECT: Direct LDL: 85.3 mg/dL

## 2013-03-31 LAB — HEMOGLOBIN A1C: Hgb A1c MFr Bld: 6.4 % (ref 4.6–6.5)

## 2013-03-31 LAB — TSH: TSH: 2.58 u[IU]/mL (ref 0.35–5.50)

## 2013-03-31 NOTE — Progress Notes (Signed)
Pre-visit discussion using our clinic review tool. No additional management support is needed unless otherwise documented below in the visit note.  

## 2013-04-01 ENCOUNTER — Other Ambulatory Visit: Payer: Self-pay | Admitting: Internal Medicine

## 2013-04-01 DIAGNOSIS — D72829 Elevated white blood cell count, unspecified: Secondary | ICD-10-CM

## 2013-04-01 NOTE — Progress Notes (Signed)
Order placed for f/u labs.  

## 2013-04-02 ENCOUNTER — Encounter: Payer: Self-pay | Admitting: Internal Medicine

## 2013-04-02 DIAGNOSIS — I639 Cerebral infarction, unspecified: Secondary | ICD-10-CM | POA: Insufficient documentation

## 2013-04-02 NOTE — Assessment & Plan Note (Signed)
Has known disease.  Currently asymptomatic.   Feels she is doing well from a cardiac standpoint.  Follow.    

## 2013-04-02 NOTE — Assessment & Plan Note (Signed)
S/P CVA.  On Eloquis.  Follow.    

## 2013-04-02 NOTE — Assessment & Plan Note (Signed)
Depression with some anxiety.  Will restart zoloft.  Have her start zoloft 25mg  1/2 tablet per day.  Follow.

## 2013-04-02 NOTE — Assessment & Plan Note (Signed)
She declines cholesterol medication.   Low cholesterol diet.  Follow.   

## 2013-04-02 NOTE — Assessment & Plan Note (Signed)
She is on eloquis now.  Rate controlled.  Follow.    

## 2013-04-02 NOTE — Progress Notes (Signed)
Subjective:    Patient ID: [name], female    DOB: [birth year], 80 y.o.   MRN: [id number]  Hypertension  80 year old female with past history of CAD s/p bypass in 10/99.  She also has a history of hypertension, hypercholesterolemia, atrial fibrillation and previous presumed TIA/CVA (2008).  Was recently admitted with CVA.  Was started on Eloquis.  Was in rehab for a short period. At home now with home health.  She comes in today to follow up on these issues as well as for a complete physical exam.  She is accompanied by her daughter. History obtained from both of them.   She states she has been doing relatively well.  She has been having issues with her blood pressure varying.  We have adjusted her medications.  Just recently called in HCTZ.  She has not started this yet.  No cardiac symptoms with increased activity or exertion.  No increased heart rate or palpitations.  Breathing stable.  Eating and drinking well.  Bowels stable.       Past Medical History  Diagnosis Date  . Type II or unspecified type diabetes mellitus without mention of complication, not stated as uncontrolled   . Essential hypertension, benign   . Anemia, unspecified   . Atrial fibrillation   . Hypercholesterolemia   . Osteoarthritis   . History of TIAs   . CVA (cerebral infarction)   . Atrial fibrillation   . Uterine cancer     s/p TAH/BSO  . CAD (coronary artery disease)     s/p CABG 10/99 with LIMA to the LAD, SVG to OM1, SVG to distal RCA    Outpatient Encounter Prescriptions as of 03/31/2013  Medication Sig  . amLODipine (NORVASC) 5 MG tablet Take 2.5 mg by mouth 2 (two) times daily.  Marland Kitchen apixaban (ELIQUIS) 2.5 MG TABS tablet Take 1 tablet (2.5 mg total) by mouth 2 (two) times daily.  Marland Kitchen lisinopril (PRINIVIL,ZESTRIL) 40 MG tablet Take 40 mg by mouth every morning.  . metoprolol tartrate (LOPRESSOR) 25 MG tablet TAKE 1 TABLET BY MOUTH TWICE A DAY  . senna-docusate (SENOKOT-S) 8.6-50 MG per tablet Take 1  tablet by mouth 2 (two) times daily.  . sertraline (ZOLOFT) 25 MG tablet Take 1 tablet (25 mg total) by mouth daily.  . [DISCONTINUED] amLODipine (NORVASC) 5 MG tablet Take 1 tablet (5 mg total) by mouth daily.  . [DISCONTINUED] lisinopril (PRINIVIL,ZESTRIL) 20 MG tablet Take 20 mg by mouth 2 (two) times daily.  Marland Kitchen glucose blood test strip 1 each by Other route as needed for other. Use one test strip bid  . hydrochlorothiazide (MICROZIDE) 12.5 MG capsule Take 1 capsule (12.5 mg total) by mouth daily.  . [DISCONTINUED] metoprolol tartrate (LOPRESSOR) 25 MG tablet Take 25 mg by mouth 2 (two) times daily.    Review of Systems Patient denies any headache, lightheadedness or dizziness.  No sinus or allergy symptoms.  No chest pain, tightness or palpitations.  No increased shortness of breath, cough or congestion.  No nausea or vomiting.  No abdominal pain or cramping.  No bowel change, such as diarrhea, constipation, BRBPR or melana.  No urine change.  Some increased anxiety.  Feel this is contributing to her blood pressure being elevated.  Off her zoloft.  Blood pressure elevated as outlined.  See attached list.          Objective:   Physical Exam  Filed Vitals:   03/31/13 0917  BP: 140/80  Pulse: 95  Temp: 98.2 F (36.8 C)   Blood pressure recheck with her machine 70/74  80 year old female in no acute distress.   HEENT:  Nares- clear.  Oropharynx - without lesions. NECK:  Supple.  Nontender.  No audible bruit.  HEART:  Appears to be regular. LUNGS:  No crackles or wheezing audible.  Respirations even and unlabored.  RADIAL PULSE:  Equal bilaterally.    BREASTS:  No nipple discharge or nipple retraction present.  Could not appreciate any distinct nodules or axillary adenopathy.  ABDOMEN:  Soft, nontender.  Bowel sounds present and normal.  No audible abdominal bruit.  GU:  Not performed.     EXTREMITIES:  No increased edema present.  DP pulses palpable and equal bilaterally.   FEET:   Some dry skin and peeling.  No skin lesions.          Assessment & Plan:  LEG PAIN AND WEAKNESS.   Using her walker.  Discussed starting home physical therapy.    HEALTH MAINTENANCE.  Has declined mammogram and further GI evaluation.  Declines bone density.  Physical today.

## 2013-04-02 NOTE — Assessment & Plan Note (Signed)
S/P stent placement.  Followed by vascular surgery.  On Eloquis.

## 2013-04-02 NOTE — Assessment & Plan Note (Signed)
Follow sodium level.  Has been stable.   

## 2013-04-02 NOTE — Assessment & Plan Note (Signed)
Blood pressure elevated.  On lisinopril 40mg  q day, amlodipine 5mg  q day and metoprolol bid.  HCTZ 12.5mg  added.  Has not started.  Plans to start today.   Follow pressures.  Follow metabolic panel.

## 2013-04-02 NOTE — Assessment & Plan Note (Signed)
Has controlled with diet.  Low carb diet.  Follow metabolic panel and a1c.   

## 2013-04-03 ENCOUNTER — Encounter: Payer: Self-pay | Admitting: *Deleted

## 2013-04-03 ENCOUNTER — Telehealth: Payer: Self-pay | Admitting: Internal Medicine

## 2013-04-03 DIAGNOSIS — I4891 Unspecified atrial fibrillation: Secondary | ICD-10-CM

## 2013-04-03 DIAGNOSIS — I69919 Unspecified symptoms and signs involving cognitive functions following unspecified cerebrovascular disease: Secondary | ICD-10-CM

## 2013-04-03 DIAGNOSIS — I69959 Hemiplegia and hemiparesis following unspecified cerebrovascular disease affecting unspecified side: Secondary | ICD-10-CM

## 2013-04-03 DIAGNOSIS — Z5189 Encounter for other specified aftercare: Secondary | ICD-10-CM

## 2013-04-03 NOTE — Telephone Encounter (Signed)
Pt.notified

## 2013-04-03 NOTE — Telephone Encounter (Signed)
Pt left vm asking Dr. Nicki Reaper to call her.

## 2013-04-03 NOTE — Telephone Encounter (Signed)
Complains that her feet is giving her problems & she mentioned a medication & wasn't sure if you were going to send something in or not. Would like it sent to [facility]

## 2013-04-03 NOTE — Telephone Encounter (Signed)
I had talked with them about her skin peeling.  I told them they could get an otc medication Lotrimin - apply to affect area bid.

## 2013-04-08 ENCOUNTER — Other Ambulatory Visit: Payer: Self-pay | Admitting: Internal Medicine

## 2013-04-17 ENCOUNTER — Telehealth: Payer: Self-pay | Admitting: *Deleted

## 2013-04-17 NOTE — Telephone Encounter (Signed)
Ok

## 2013-04-17 NOTE — Telephone Encounter (Signed)
Verbal order given to continue home health speech therapy x 3 weeks

## 2013-04-18 ENCOUNTER — Other Ambulatory Visit: Payer: Medicare Other

## 2013-04-19 ENCOUNTER — Telehealth: Payer: Self-pay | Admitting: *Deleted

## 2013-04-19 DIAGNOSIS — I10 Essential (primary) hypertension: Secondary | ICD-10-CM

## 2013-04-19 NOTE — Telephone Encounter (Signed)
Need to confirm medications she is taking now.  Should be on amlodipine 771m q day, lisinopril 49mq day, metoprolol bid and the most recent added was hctz 12.71m103m day.  Make sure she did start the HCTZ 12.71mg46mday.  If so, and blood pressure remaining elevated, then can increase to hctz 271mg271may.  Have home health draw a met b in one week.  Thanks.

## 2013-04-19 NOTE — Telephone Encounter (Signed)
Daughter Jacqlyn Larsen) notified & will increase her HCTZ to 25mg  QD. Home health is unable to draw her labs so patient scheduled a lab appt here on 3/11 @ 3:30. She will need orders placed.

## 2013-04-19 NOTE — Telephone Encounter (Signed)
Order placed for f/u lab.   

## 2013-04-19 NOTE — Telephone Encounter (Signed)
Reports that her BP is increased: on 2/25:186/70, 2/27:182/64, & today:184/62. Denies any sx's such as headaches. Please contact patient if you recommend any medication changes or adjustments

## 2013-04-26 ENCOUNTER — Encounter (INDEPENDENT_AMBULATORY_CARE_PROVIDER_SITE_OTHER): Payer: Self-pay

## 2013-04-26 ENCOUNTER — Other Ambulatory Visit (INDEPENDENT_AMBULATORY_CARE_PROVIDER_SITE_OTHER): Payer: Medicare Other

## 2013-04-26 DIAGNOSIS — D72829 Elevated white blood cell count, unspecified: Secondary | ICD-10-CM

## 2013-04-26 DIAGNOSIS — I10 Essential (primary) hypertension: Secondary | ICD-10-CM

## 2013-04-27 LAB — CBC WITH DIFFERENTIAL/PLATELET
BASOS ABS: 0.1 10*3/uL (ref 0.0–0.1)
Basophils Relative: 0.7 % (ref 0.0–3.0)
EOS ABS: 0.1 10*3/uL (ref 0.0–0.7)
Eosinophils Relative: 1.2 % (ref 0.0–5.0)
HCT: 36.3 % (ref 36.0–46.0)
Hemoglobin: 12.3 g/dL (ref 12.0–15.0)
Lymphocytes Relative: 19.9 % (ref 12.0–46.0)
Lymphs Abs: 2.1 10*3/uL (ref 0.7–4.0)
MCHC: 33.9 g/dL (ref 30.0–36.0)
MCV: 89.6 fl (ref 78.0–100.0)
MONO ABS: 1.2 10*3/uL — AB (ref 0.1–1.0)
Monocytes Relative: 10.8 % (ref 3.0–12.0)
NEUTROS PCT: 67.4 % (ref 43.0–77.0)
Neutro Abs: 7.2 10*3/uL (ref 1.4–7.7)
Platelets: 265 10*3/uL (ref 150.0–400.0)
RBC: 4.05 Mil/uL (ref 3.87–5.11)
RDW: 14.3 % (ref 11.5–14.6)
WBC: 10.7 10*3/uL — ABNORMAL HIGH (ref 4.5–10.5)

## 2013-04-27 LAB — BASIC METABOLIC PANEL
BUN: 19 mg/dL (ref 6–23)
CHLORIDE: 94 meq/L — AB (ref 96–112)
CO2: 31 mEq/L (ref 19–32)
CREATININE: 0.6 mg/dL (ref 0.4–1.2)
Calcium: 10.1 mg/dL (ref 8.4–10.5)
GFR: 92.89 mL/min (ref >60.00)
Glucose, Bld: 173 mg/dL — ABNORMAL HIGH (ref 70–99)
POTASSIUM: 4.1 meq/L (ref 3.5–5.1)
Sodium: 133 mEq/L — ABNORMAL LOW (ref 135–145)

## 2013-04-28 ENCOUNTER — Other Ambulatory Visit: Payer: Self-pay | Admitting: *Deleted

## 2013-04-28 ENCOUNTER — Telehealth: Payer: Self-pay | Admitting: Internal Medicine

## 2013-04-28 MED ORDER — HYDROCHLOROTHIAZIDE 12.5 MG PO CAPS: 12.5000 mg | ORAL_CAPSULE | Freq: Every day | ORAL | Status: DC

## 2013-04-28 NOTE — Telephone Encounter (Signed)
increased Rx to a quantity of 45 & pt notified

## 2013-04-28 NOTE — Telephone Encounter (Signed)
The patient has increased her medication from 1-2 pills daily and she is running out of medication   hydrochlorothiazide (MICROZIDE) 12.5 MG capsule

## 2013-04-29 ENCOUNTER — Other Ambulatory Visit: Payer: Self-pay | Admitting: Internal Medicine

## 2013-04-29 DIAGNOSIS — D72829 Elevated white blood cell count, unspecified: Secondary | ICD-10-CM

## 2013-04-29 DIAGNOSIS — E871 Hypo-osmolality and hyponatremia: Secondary | ICD-10-CM

## 2013-04-29 NOTE — Progress Notes (Signed)
Orders placed for labs

## 2013-05-01 ENCOUNTER — Encounter: Payer: Self-pay | Admitting: *Deleted

## 2013-05-04 ENCOUNTER — Telehealth: Payer: Self-pay | Admitting: Internal Medicine

## 2013-05-04 NOTE — Telephone Encounter (Signed)
The patient is wanting to cancel her lab appointment because she is finding it difficult to come out to her appointments. She wants her labs drawn on her appointment day with Dr. Nicki Reaper.

## 2013-05-05 NOTE — Telephone Encounter (Signed)
Pt is unable to keep appointment & it has already been cancelled

## 2013-05-05 NOTE — Telephone Encounter (Signed)
Notify her that I would prefer her keep her lab appt, but if unable - will repeat labs at appt.

## 2013-05-08 ENCOUNTER — Telehealth: Payer: Self-pay | Admitting: Internal Medicine

## 2013-05-08 NOTE — Telephone Encounter (Signed)
Patient Information:  Caller Name: [name]  Phone: [phone]  Patient: [name]  Gender: Female  DOB: Jun 17, 1922  Age: 80 Years  PCP: [name]  Office Follow Up:  Does the office need to follow up with this patient?: Yes  Instructions For The Office: Son would like to speak with [name] regarding recurrent HTN at night and medications  RN Note:  Last office visit 03/31/13.  Daughter administers meds.  Difficult to get [name] to come in due to mobility problems.  [name] is convinced she needs additional medicaion at night.  Declined recommendation to be seen today due to mobility issues.  [name]/son requests [name] call  him back to discuss options and if MD willing to further adjust meds without seeing her.  Symptoms  Reason For Call & Symptoms: Question about BP medications.  History of CVA 11/14. Thinks elevated BP is waking her at night, has vision problems when gets up to bathroom and feels hot all over.  Hospital changed medications and evening BP have been elevated since then. BP 136/58  06/18/52 AM.  Systolic is in 656'C at night, with dizziness and vision changes.  [name] thinks 25 mg Amlodipine in evening is not enough.  Asking if can add Lisinopril at night.  She is "worrying herself sick" about BP meds.  Has appt for 06/02/13  Reviewed Health History In EMR: Yes  Reviewed Medications In EMR: Yes  Reviewed Allergies In EMR: Yes  Reviewed Surgeries / Procedures: Yes  Date of Onset of Symptoms: Unknown  Guideline(s) Used:  High Blood Pressure  Disposition Per Guideline:   See Today in Office  Reason For Disposition Reached:   BP > 180/110  Advice Given:  General:  Untreated high blood pressure may cause damage to the heart, brain, kidneys, and eyes.  Treatment of high blood pressure can reduce the risk of stroke, heart attack, and heart failure.  The goal of blood pressure treatment for most patients with hypertension is to keep the blood pressure under  140/90.  Call Back If:  Headache, blurred vision, difficulty talking, or difficulty walking occurs  Chest pain or difficulty breathing occurs  You want to go in to the office for a blood pressure check  You become worse.  RN Overrode Recommendation:  Follow Up With Office Later  Requests MD advice about medicatons.

## 2013-05-08 NOTE — Telephone Encounter (Signed)
Please advise 

## 2013-05-08 NOTE — Telephone Encounter (Signed)
Ed pt son called wanting to talk to someone about ms Wiegert bp. Ms colborn stated at night she having problem with bp feeling dizzy and vision problems  Wants to know if she can take bp meds at night Sent to [facility]ige

## 2013-05-08 NOTE — Telephone Encounter (Signed)
Spoke to pt, pts daughter and her son.  Discussed at length regarding her blood pressure and her medications.  She is worried that she is not on "enough medication".  She gets anxious about this. Checks her pressure multiple times in a row.  We discussed how long medications remain in her system.  We discussed her medication regimen.  States her pressure earlier this afternoon was 782 systolic.  Recheck 205.  Head feels funny.  I advised her, her daughter and her son - that she needed to go to ER now for evaluation and further monitoring and treatment.  I also discussed increasing some of her medications.  Has had some pulse rated documented in the 50-60s range.  Will hold on increasing her metoprolol at this time.  Will increase her amlodipine to 5mg  bid.  Follow pressures.  ER evaluation now.

## 2013-05-08 NOTE — Telephone Encounter (Signed)
We can adjust medications - as far as time taking, but agree with need of more information regarding dizziness and vision problems.  If acute symptoms, will need to be evaluated.

## 2013-05-11 ENCOUNTER — Other Ambulatory Visit: Payer: Medicare Other

## 2013-05-25 ENCOUNTER — Telehealth: Payer: Self-pay | Admitting: *Deleted

## 2013-05-25 MED ORDER — LISINOPRIL 40 MG PO TABS: 40.0000 mg | ORAL_TABLET | ORAL | Status: DC

## 2013-05-25 NOTE — Telephone Encounter (Signed)
Refill sent as directed

## 2013-05-25 NOTE — Telephone Encounter (Signed)
Pharmacy Note:  Pt say DR increased to 40 mg and pt is almost out because having to double up in 20's need new rx    Lisinopril

## 2013-06-02 ENCOUNTER — Ambulatory Visit: Payer: Medicare Other | Admitting: Internal Medicine

## 2013-06-09 ENCOUNTER — Other Ambulatory Visit: Payer: Self-pay | Admitting: Internal Medicine

## 2013-06-09 MED ORDER — AMLODIPINE BESYLATE 5 MG PO TABS: 5.0000 mg | ORAL_TABLET | Freq: Two times a day (BID) | ORAL | Status: DC

## 2013-06-09 NOTE — Telephone Encounter (Signed)
The patient is needing a new prescription called into the pharmacy because her dosage has increased. The patient is out of her medication.  amLODipine (NORVASC) 5 MG tablet

## 2013-06-09 NOTE — Telephone Encounter (Signed)
Rx sent to pharmacy by escript. Left message for patient, notifying Rx sent to pharmacy and need for followup appointment. Requested call back to schedule.

## 2013-06-09 NOTE — Telephone Encounter (Signed)
Ok to refill amlodipine 5mg  with instructions to take one bid.  Also needs a f/u appt with me (66min)

## 2013-06-09 NOTE — Telephone Encounter (Signed)
Spoke with pt, daughter is not currently home. 05/08/13 telephone note wanted pt to increase Amlodipine 5mg  to bid and be seen in ED. Pt has increased the Amlodipine, checks her BP regularly, but unable to tell me any readings because she cannot get to them and daughter not there to help. States they are "better". Pt is unsure if she was taken to the ED, does not remember, but states has not changed any of her other medications. She has not had office visit since the increase and none scheduled. When do you want to see her again and ok refill for bid?

## 2013-06-12 ENCOUNTER — Telehealth: Payer: Self-pay | Admitting: Internal Medicine

## 2013-06-12 NOTE — Telephone Encounter (Signed)
States they received a call that the pt needed to come in to discuss medications.  [name] asking if she can bring in [name] 5/5 with her ([name]'s) appt at 3:30 to be seen as well?  [name] states she could not get here any earlier than 3:30.  Would like a call if [name] can be added on.

## 2013-06-12 NOTE — Telephone Encounter (Signed)
See if the patient in the 4:00 spot on 06/20/13 can come at 11:45.  If so, then please put [name] on the 06/20/13 schedule at 4:00.  Thanks.  Let me know if a problem.

## 2013-06-12 NOTE — Telephone Encounter (Signed)
See 06/09/13 refill encounter regarding need for appointment

## 2013-06-13 NOTE — Telephone Encounter (Signed)
Left message for [name] to call office.  Please let her know about ms Roethler's appointment 06/20/13 @ 4

## 2013-06-15 NOTE — Telephone Encounter (Signed)
Left message for [name] to call office

## 2013-06-20 ENCOUNTER — Encounter: Payer: Self-pay | Admitting: Internal Medicine

## 2013-06-20 ENCOUNTER — Ambulatory Visit (INDEPENDENT_AMBULATORY_CARE_PROVIDER_SITE_OTHER): Payer: Medicare Other | Admitting: Internal Medicine

## 2013-06-20 VITALS — BP 150/60 | HR 92 | Temp 98.0°F | Ht 66.0 in | Wt 135.8 lb

## 2013-06-20 DIAGNOSIS — I739 Peripheral vascular disease, unspecified: Secondary | ICD-10-CM

## 2013-06-20 DIAGNOSIS — I2510 Atherosclerotic heart disease of native coronary artery without angina pectoris: Secondary | ICD-10-CM

## 2013-06-20 DIAGNOSIS — E119 Type 2 diabetes mellitus without complications: Secondary | ICD-10-CM

## 2013-06-20 DIAGNOSIS — I4891 Unspecified atrial fibrillation: Secondary | ICD-10-CM

## 2013-06-20 DIAGNOSIS — F3289 Other specified depressive episodes: Secondary | ICD-10-CM

## 2013-06-20 DIAGNOSIS — G629 Polyneuropathy, unspecified: Secondary | ICD-10-CM

## 2013-06-20 DIAGNOSIS — E871 Hypo-osmolality and hyponatremia: Secondary | ICD-10-CM

## 2013-06-20 DIAGNOSIS — G589 Mononeuropathy, unspecified: Secondary | ICD-10-CM

## 2013-06-20 DIAGNOSIS — D72829 Elevated white blood cell count, unspecified: Secondary | ICD-10-CM

## 2013-06-20 DIAGNOSIS — B351 Tinea unguium: Secondary | ICD-10-CM

## 2013-06-20 DIAGNOSIS — I6350 Cerebral infarction due to unspecified occlusion or stenosis of unspecified cerebral artery: Secondary | ICD-10-CM

## 2013-06-20 DIAGNOSIS — I639 Cerebral infarction, unspecified: Secondary | ICD-10-CM

## 2013-06-20 DIAGNOSIS — F32A Depression, unspecified: Secondary | ICD-10-CM

## 2013-06-20 DIAGNOSIS — I10 Essential (primary) hypertension: Secondary | ICD-10-CM

## 2013-06-20 DIAGNOSIS — E7800 Pure hypercholesterolemia, unspecified: Secondary | ICD-10-CM

## 2013-06-20 DIAGNOSIS — F329 Major depressive disorder, single episode, unspecified: Secondary | ICD-10-CM

## 2013-06-20 MED ORDER — SERTRALINE HCL 50 MG PO TABS: 50.0000 mg | ORAL_TABLET | Freq: Every day | ORAL | Status: DC

## 2013-06-20 MED ORDER — HYDROCHLOROTHIAZIDE 25 MG PO TABS: 25.0000 mg | ORAL_TABLET | Freq: Every day | ORAL | Status: DC

## 2013-06-20 MED ORDER — AMLODIPINE BESYLATE 5 MG PO TABS: 5.0000 mg | ORAL_TABLET | Freq: Two times a day (BID) | ORAL | Status: DC

## 2013-06-20 MED ORDER — METOPROLOL TARTRATE 25 MG PO TABS: 25.0000 mg | ORAL_TABLET | Freq: Two times a day (BID) | ORAL | Status: DC

## 2013-06-20 MED ORDER — LISINOPRIL 40 MG PO TABS: 40.0000 mg | ORAL_TABLET | ORAL | Status: DC

## 2013-06-20 NOTE — Progress Notes (Signed)
Pre visit review using our clinic review tool, if applicable. No additional management support is needed unless otherwise documented below in the visit note. 

## 2013-06-21 ENCOUNTER — Encounter: Payer: Self-pay | Admitting: *Deleted

## 2013-06-21 LAB — WBC: WBC: 10.1 10*3/uL (ref 3.4–10.8)

## 2013-06-21 LAB — SODIUM: SODIUM: 132 meq/L — AB (ref 135–145)

## 2013-06-22 ENCOUNTER — Encounter: Payer: Self-pay | Admitting: Internal Medicine

## 2013-06-22 NOTE — Progress Notes (Signed)
Subjective:    Patient ID: [name], female    DOB: [birth year], 80 y.o.   MRN: [id number]  Hypertension  80 year old female with past history of CAD s/p bypass in 10/99.  She also has a history of hypertension, hypercholesterolemia, atrial fibrillation and previous presumed TIA/CVA (2008).  Was recently admitted with CVA.  Was started on Eloquis.  Was in rehab for a short period.  At home now.  Her daughter lives with her.  She comes in today for a scheduled follow up. She is accompanied by her daughter through part of this visit.  History obtained from both of them.   She states she has been doing better.  She had been having issues with her blood pressure varying.  We have adjusted her medications.  Blood pressure better now.  No cardiac symptoms with increased activity or exertion.  No increased heart rate or palpitations.  Breathing stable.  Eating and drinking well.  Bowels stable.  She does report some persistent problems with her feet peeling.  Has tried various lotions and creams - the last being lotrimin.   She is also concerned regarding her thick toe nails.  Request referral.  Some continued burning in her feet at night.  Still with some increased anxiety.  On zoloft and tolerating.  Feels it is helping.  On low dose.  Discussed increasing the dose.  She is agreeable.    Past Medical History  Diagnosis Date  . Type II or unspecified type diabetes mellitus without mention of complication, not stated as uncontrolled   . Essential hypertension, benign   . Anemia, unspecified   . Atrial fibrillation   . Hypercholesterolemia   . Osteoarthritis   . History of TIAs   . CVA (cerebral infarction)   . Atrial fibrillation   . Uterine cancer     s/p TAH/BSO  . CAD (coronary artery disease)     s/p CABG 10/99 with LIMA to the LAD, SVG to OM1, SVG to distal RCA    Outpatient Encounter Prescriptions as of 06/20/2013  Medication Sig  . amLODipine (NORVASC) 5 MG tablet Take 1 tablet (5 mg  total) by mouth 2 (two) times daily.  Marland Kitchen ELIQUIS 2.5 MG TABS tablet TAKE 1 TABLET BY MOUTH TWICE A DAY  . glucose blood test strip 1 each by Other route as needed for other. Use one test strip bid  . lisinopril (PRINIVIL,ZESTRIL) 40 MG tablet Take 1 tablet (40 mg total) by mouth every morning.  . metoprolol tartrate (LOPRESSOR) 25 MG tablet Take 1 tablet (25 mg total) by mouth 2 (two) times daily.  Marland Kitchen senna-docusate (SENOKOT-S) 8.6-50 MG per tablet Take 1 tablet by mouth 2 (two) times daily.  . [DISCONTINUED] amLODipine (NORVASC) 5 MG tablet Take 1 tablet (5 mg total) by mouth 2 (two) times daily.  . [DISCONTINUED] hydrochlorothiazide (MICROZIDE) 12.5 MG capsule Take 1-2 capsules (12.5-25 mg total) by mouth daily.  . [DISCONTINUED] lisinopril (PRINIVIL,ZESTRIL) 40 MG tablet Take 1 tablet (40 mg total) by mouth every morning.  . [DISCONTINUED] metoprolol tartrate (LOPRESSOR) 25 MG tablet TAKE 1 TABLET BY MOUTH TWICE A DAY  . [DISCONTINUED] sertraline (ZOLOFT) 25 MG tablet Take 1 tablet (25 mg total) by mouth daily.  . hydrochlorothiazide (HYDRODIURIL) 25 MG tablet Take 1 tablet (25 mg total) by mouth daily.  . sertraline (ZOLOFT) 50 MG tablet Take 1 tablet (50 mg total) by mouth daily.  . [DISCONTINUED] hydrochlorothiazide (HYDRODIURIL) 25 MG tablet Take 1 tablet (25 mg  total) by mouth daily.  . [DISCONTINUED] sertraline (ZOLOFT) 50 MG tablet Take 1 tablet (50 mg total) by mouth daily.    Review of Systems Patient denies any headache, lightheadedness or dizziness.  No sinus or allergy symptoms.  No chest pain, tightness or palpitations.  No increased shortness of breath, cough or congestion.  No nausea or vomiting.  No abdominal pain or cramping.  No bowel change, such as diarrhea, constipation, BRBPR or melana.  No urine change.  Some increased anxiety.  On zoloft.  Tolerating.  Describes the burning in her feet as outlined.   Also describes peeling of her feet.         Objective:   Physical  Exam  Filed Vitals:   06/20/13 1623  BP: 150/60  Pulse: 92  Temp: 98 F (36.7 C)   Blood pressure recheck:  38-74/80  80 year old female in no acute distress.   HEENT:  Nares- clear.  Oropharynx - without lesions. NECK:  Supple.  Nontender.  No audible bruit.  HEART:  Appears to be regular. I-II systolic murmur.  LUNGS:  No crackles or wheezing audible.  Respirations even and unlabored.  RADIAL PULSE:  Equal bilaterally.    ABDOMEN:  Soft, nontender.  Bowel sounds present and normal.  No audible abdominal bruit.   EXTREMITIES:  No increased edema present.    FEET:  Skin peeling.  No increased erythema or warmth.  Thickened toe nails.          Assessment & Plan:  HEALTH MAINTENANCE.  Has declined mammogram and further GI evaluation.  Declines bone density.  Physical last visit.    I spent 25 minutes with the patient and more than 50% of the time was spent in consultation regarding the above.

## 2013-06-25 ENCOUNTER — Encounter: Payer: Self-pay | Admitting: Internal Medicine

## 2013-06-25 ENCOUNTER — Telehealth: Payer: Self-pay | Admitting: Internal Medicine

## 2013-06-25 DIAGNOSIS — B351 Tinea unguium: Secondary | ICD-10-CM | POA: Insufficient documentation

## 2013-06-25 NOTE — Telephone Encounter (Signed)
Schedule a f/u appt in 3 months (26min).  Thanks.

## 2013-06-25 NOTE — Assessment & Plan Note (Signed)
Follow sodium level.  Has been stable.   

## 2013-06-25 NOTE — Assessment & Plan Note (Signed)
S/P CVA.  On Eloquis.  Follow.    

## 2013-06-25 NOTE — Assessment & Plan Note (Signed)
She declines cholesterol medication.   Low cholesterol diet.  Follow.   

## 2013-06-25 NOTE — Assessment & Plan Note (Signed)
Has known disease.  Currently asymptomatic.   Feels she is doing well from a cardiac standpoint.  Follow.    

## 2013-06-25 NOTE — Assessment & Plan Note (Signed)
Depression with some anxiety.  On zoloft 25mg  and doing some better.  Will increase zoloft to 50mg  q day.  Follow.

## 2013-06-25 NOTE — Assessment & Plan Note (Signed)
S/P stent placement.  Followed by vascular surgery.  On Eloquis.

## 2013-06-25 NOTE — Assessment & Plan Note (Signed)
She is on eloquis now.  Rate controlled.  Follow.

## 2013-06-25 NOTE — Assessment & Plan Note (Signed)
Has controlled with diet.  Low carb diet.  Follow metabolic panel and a1c.   

## 2013-06-25 NOTE — Assessment & Plan Note (Signed)
Has the thickened toenails and peeling skin on her feet.  Has tried various lotions and creams for her feet.  Refer to podiatry for further evaluation and treatment.

## 2013-06-25 NOTE — Assessment & Plan Note (Signed)
Blood pressure elevated.  On lisinopril 40mg  q day, amlodipine 5mg  q day and metoprolol bid.  She is also on HCTZ 25mg  one tablet per day.  Follow pressures.  Follow metabolic panel.

## 2013-06-25 NOTE — Assessment & Plan Note (Signed)
Increase zoloft as outlined.  Hold gabapentin at this time.  Follow.

## 2013-06-30 NOTE — Telephone Encounter (Signed)
Appointment date 8/27  Pt aware

## 2013-07-11 ENCOUNTER — Other Ambulatory Visit: Payer: Self-pay | Admitting: Internal Medicine

## 2013-07-14 ENCOUNTER — Other Ambulatory Visit: Payer: Self-pay | Admitting: *Deleted

## 2013-07-14 MED ORDER — APIXABAN 2.5 MG PO TABS: ORAL_TABLET | ORAL | Status: DC

## 2013-07-31 ENCOUNTER — Ambulatory Visit: Payer: Self-pay | Admitting: Podiatry

## 2013-08-12 ENCOUNTER — Other Ambulatory Visit: Payer: Self-pay | Admitting: Internal Medicine

## 2013-08-14 ENCOUNTER — Other Ambulatory Visit: Payer: Self-pay | Admitting: *Deleted

## 2013-08-14 MED ORDER — APIXABAN 2.5 MG PO TABS: ORAL_TABLET | ORAL | Status: DC

## 2013-08-14 NOTE — Telephone Encounter (Signed)
Last OV and refill 5.5.15.  Please advise refill.

## 2013-08-14 NOTE — Telephone Encounter (Signed)
Refilled zoloft #30 with 3 refills.   

## 2013-10-10 ENCOUNTER — Telehealth: Payer: Self-pay | Admitting: *Deleted

## 2013-10-10 NOTE — Telephone Encounter (Signed)
[name] called requesting a return call from Dr Nicki Reaper.  He would like to discuss with Dr Nicki Reaper pts foot dryness and cracking, he is also requesting his mother be prescribed something stronger than Tylenol for her Arthritis pain.  He feels that due to his mothers age she may forget to mention everything to Dr Nicki Reaper during her visit. [name] may be contacted at [phone]. Please advise

## 2013-10-10 NOTE — Telephone Encounter (Signed)
Spoke with pts son advised of MDs message.  He verbalized understanding and agreement with MDs plan.

## 2013-10-10 NOTE — Telephone Encounter (Signed)
She is scheduled to see me on 10/12/13. I appreciate the heads up with some concerns.   I can assess her pain level at that appt.  I would prefer not to do strong pain medication given possible drug side effects.  Regarding the foot cracking, is she using cream now.  I can also assess this at visit,, unless feels needs something before her appt.

## 2013-10-12 ENCOUNTER — Ambulatory Visit (INDEPENDENT_AMBULATORY_CARE_PROVIDER_SITE_OTHER): Payer: Medicare Other | Admitting: Internal Medicine

## 2013-10-12 ENCOUNTER — Encounter: Payer: Self-pay | Admitting: Internal Medicine

## 2013-10-12 VITALS — BP 130/70 | HR 93 | Temp 97.6°F | Ht 66.0 in | Wt 139.0 lb

## 2013-10-12 DIAGNOSIS — G629 Polyneuropathy, unspecified: Secondary | ICD-10-CM

## 2013-10-12 DIAGNOSIS — I2510 Atherosclerotic heart disease of native coronary artery without angina pectoris: Secondary | ICD-10-CM

## 2013-10-12 DIAGNOSIS — I6350 Cerebral infarction due to unspecified occlusion or stenosis of unspecified cerebral artery: Secondary | ICD-10-CM

## 2013-10-12 DIAGNOSIS — I4891 Unspecified atrial fibrillation: Secondary | ICD-10-CM

## 2013-10-12 DIAGNOSIS — F3289 Other specified depressive episodes: Secondary | ICD-10-CM

## 2013-10-12 DIAGNOSIS — E7800 Pure hypercholesterolemia, unspecified: Secondary | ICD-10-CM

## 2013-10-12 DIAGNOSIS — E1159 Type 2 diabetes mellitus with other circulatory complications: Secondary | ICD-10-CM

## 2013-10-12 DIAGNOSIS — F329 Major depressive disorder, single episode, unspecified: Secondary | ICD-10-CM

## 2013-10-12 DIAGNOSIS — Z23 Encounter for immunization: Secondary | ICD-10-CM

## 2013-10-12 DIAGNOSIS — I639 Cerebral infarction, unspecified: Secondary | ICD-10-CM

## 2013-10-12 DIAGNOSIS — F32A Depression, unspecified: Secondary | ICD-10-CM

## 2013-10-12 DIAGNOSIS — I482 Chronic atrial fibrillation, unspecified: Secondary | ICD-10-CM

## 2013-10-12 DIAGNOSIS — G589 Mononeuropathy, unspecified: Secondary | ICD-10-CM

## 2013-10-12 DIAGNOSIS — I739 Peripheral vascular disease, unspecified: Secondary | ICD-10-CM

## 2013-10-12 DIAGNOSIS — I10 Essential (primary) hypertension: Secondary | ICD-10-CM

## 2013-10-12 DIAGNOSIS — E871 Hypo-osmolality and hyponatremia: Secondary | ICD-10-CM

## 2013-10-12 NOTE — Progress Notes (Signed)
Pre visit review using our clinic review tool, if applicable. No additional management support is needed unless otherwise documented below in the visit note. 

## 2013-10-13 ENCOUNTER — Telehealth: Payer: Self-pay | Admitting: *Deleted

## 2013-10-13 NOTE — Telephone Encounter (Signed)
Fax from pharmacy requesting Eliquis 2.5mg .  Last refill 6.29.15, last OV 8.27.15.  Please advise refill

## 2013-10-14 MED ORDER — APIXABAN 2.5 MG PO TABS: ORAL_TABLET | ORAL | Status: DC

## 2013-10-14 NOTE — Telephone Encounter (Signed)
rx sent in to [facility].  eloquis #180 with 1 refill.

## 2013-10-15 ENCOUNTER — Encounter: Payer: Self-pay | Admitting: Internal Medicine

## 2013-10-15 NOTE — Assessment & Plan Note (Signed)
S/P stent placement.  Followed by vascular surgery.  On Eloquis.  Is overdue f/u.  With the increased swelling, skin changes and discomfort, will have vascular reevaluate.  Discussed using compression hose.  Follow.

## 2013-10-15 NOTE — Assessment & Plan Note (Signed)
Follow sodium level.  Has been stable.

## 2013-10-15 NOTE — Assessment & Plan Note (Signed)
She declines cholesterol medication.   Low cholesterol diet.  Follow.

## 2013-10-15 NOTE — Progress Notes (Signed)
Subjective:    Patient ID: [name], female    DOB: 08-19-1922, 80 y.o.   MRN: [id number]  Hypertension  80 year old female with past history of CAD s/p bypass in 10/99.  She also has a history of hypertension, hypercholesterolemia, atrial fibrillation and previous presumed TIA/CVA (2008).  Was recently admitted with CVA.  Was started on Eloquis.  Was in rehab for a short period.  At home now.  Her daughter lives with her.  She comes in today for a scheduled follow up. She is accompanied by her daughter.  History obtained from both of them.   She states she has been doing better.  She had been having issues with her blood pressure varying.  We have adjusted her medications and started her on zoloft.  Blood pressure better now.  No cardiac symptoms with increased activity or exertion.  No increased heart rate or palpitations.  Breathing stable.  Eating and drinking well.  Bowels stable.  She does report some persistent problems with her feet peeling.  Has tried various lotions and creams - the last being lotrimin.   Some continued burning/aching in her feet at night.  She takes tylenol and this helps.  She only takes one tablet at a time.     Past Medical History  Diagnosis Date  . Type II or unspecified type diabetes mellitus without mention of complication, not stated as uncontrolled   . Essential hypertension, benign   . Anemia, unspecified   . Atrial fibrillation   . Hypercholesterolemia   . Osteoarthritis   . History of TIAs   . CVA (cerebral infarction)   . Atrial fibrillation   . Uterine cancer     s/p TAH/BSO  . CAD (coronary artery disease)     s/p CABG 10/99 with LIMA to the LAD, SVG to OM1, SVG to distal RCA    Outpatient Encounter Prescriptions as of 10/12/2013  Medication Sig  . amLODipine (NORVASC) 5 MG tablet Take 1 tablet (5 mg total) by mouth 2 (two) times daily.  Marland Kitchen glucose blood test strip 1 each by Other route as needed for other. Use one test strip bid  .  hydrochlorothiazide (HYDRODIURIL) 25 MG tablet Take 1 tablet (25 mg total) by mouth daily.  Marland Kitchen lisinopril (PRINIVIL,ZESTRIL) 40 MG tablet Take 1 tablet (40 mg total) by mouth every morning.  . metoprolol tartrate (LOPRESSOR) 25 MG tablet Take 1 tablet (25 mg total) by mouth 2 (two) times daily.  Marland Kitchen senna-docusate (SENOKOT-S) 8.6-50 MG per tablet Take 1 tablet by mouth 2 (two) times daily.  . sertraline (ZOLOFT) 50 MG tablet Take 1 tablet (50 mg total) by mouth daily.  . [DISCONTINUED] apixaban (ELIQUIS) 2.5 MG TABS tablet TAKE 1 TABLET BY MOUTH TWICE A DAY  . [DISCONTINUED] hydrochlorothiazide (HYDRODIURIL) 25 MG tablet TAKE 1 TABLET (25 MG TOTAL) BY MOUTH DAILY.  . [DISCONTINUED] sertraline (ZOLOFT) 50 MG tablet TAKE 1 TABLET (50 MG TOTAL) BY MOUTH DAILY.    Review of Systems Patient denies any headache, lightheadedness or dizziness.  No sinus or allergy symptoms.  No chest pain, tightness or palpitations.  No increased shortness of breath, cough or congestion.  No nausea or vomiting.  No abdominal pain or cramping.  No bowel change, such as diarrhea, constipation, BRBPR or melana.  No urine change.  Some increased anxiety.  On zoloft.  Tolerating.  Doing better.  Describes the burning/aching in her feet as outlined.   Also describes peeling of her feet.  Some increased swelling.  May be worsening her skin changes.  Blood pressures attached.  Better.        Objective:   Physical Exam  Filed Vitals:   10/12/13 1624  BP: 130/70  Pulse: 93  Temp: 97.6 F (36.4 C)   Blood pressure recheck:  142/68, pulse 84  80 year old female in no acute distress.   HEENT:  Nares- clear.  Oropharynx - without lesions. NECK:  Supple.  Nontender.  No audible bruit.  HEART:  Appears to be regular. I-II systolic murmur.  LUNGS:  No crackles or wheezing audible.  Respirations even and unlabored.  RADIAL PULSE:  Equal bilaterally.    ABDOMEN:  Soft, nontender.  Bowel sounds present and normal.  No audible  abdominal bruit.   EXTREMITIES:  Increased pedal and ankle edema.   Could not palpate DP pulses.   FEET:  Skin peeling.  No increased erythema or warmth.  Thickened toe nails.          Assessment & Plan:  HEALTH MAINTENANCE.  Physical 03/31/13.  Has declined mammogram and further GI evaluation.  Declines bone density.  Physical last visit.    I spent 25 minutes with the patient and more than 50% of the time was spent in consultation regarding the above.

## 2013-10-15 NOTE — Assessment & Plan Note (Signed)
Has known disease.  Currently asymptomatic.   Feels she is doing well from a cardiac standpoint.  Follow.

## 2013-10-15 NOTE — Assessment & Plan Note (Signed)
We discussed treatment options.  Discussed increasing tylenol.  Discussed gabapentin.  She wants to increase tylenol first.  Hold gabapentin at this time.  Follow.

## 2013-10-15 NOTE — Assessment & Plan Note (Signed)
She is on eloquis.  Rate controlled.  Follow.

## 2013-10-15 NOTE — Assessment & Plan Note (Signed)
S/P CVA.  On Eloquis.  Follow.

## 2013-10-15 NOTE — Assessment & Plan Note (Signed)
Depression with some anxiety.  On zoloft 25mg  50mg  q day.  Doing better.  Follow.

## 2013-10-15 NOTE — Assessment & Plan Note (Signed)
Blood pressure doing better.  On lisinopril 40mg  q day, amlodipine 5mg  q day and metoprolol bid.  She is also on HCTZ 25mg  one tablet per day.  Follow pressures.  Follow metabolic panel.

## 2013-10-15 NOTE — Assessment & Plan Note (Signed)
Has controlled with diet.  Low carb diet.  Follow metabolic panel and G8Q.

## 2013-10-30 ENCOUNTER — Other Ambulatory Visit: Payer: Self-pay | Admitting: *Deleted

## 2013-10-30 ENCOUNTER — Telehealth: Payer: Self-pay | Admitting: Internal Medicine

## 2013-10-30 MED ORDER — APIXABAN 2.5 MG PO TABS: ORAL_TABLET | ORAL | Status: DC

## 2013-10-30 NOTE — Telephone Encounter (Signed)
Left voicemail notifying patient that she needs to contact her pharmacy [facility]). If was sent in on 10/14/13 for 90 day supply with one refill.

## 2013-10-30 NOTE — Telephone Encounter (Signed)
Called in and stated needs a refill on her ELIQUIS 2.5 mg. Would like to have it sent to [facility] [address].

## 2013-11-09 ENCOUNTER — Ambulatory Visit (INDEPENDENT_AMBULATORY_CARE_PROVIDER_SITE_OTHER): Payer: Medicare Other | Admitting: Podiatry

## 2013-11-09 ENCOUNTER — Encounter: Payer: Self-pay | Admitting: Podiatry

## 2013-11-09 ENCOUNTER — Other Ambulatory Visit (INDEPENDENT_AMBULATORY_CARE_PROVIDER_SITE_OTHER): Payer: Medicare Other

## 2013-11-09 ENCOUNTER — Other Ambulatory Visit: Payer: Self-pay | Admitting: *Deleted

## 2013-11-09 ENCOUNTER — Telehealth: Payer: Self-pay | Admitting: *Deleted

## 2013-11-09 VITALS — BP 147/69 | HR 82 | Resp 16 | Ht 66.0 in | Wt 138.0 lb

## 2013-11-09 DIAGNOSIS — E1151 Type 2 diabetes mellitus with diabetic peripheral angiopathy without gangrene: Secondary | ICD-10-CM

## 2013-11-09 DIAGNOSIS — F329 Major depressive disorder, single episode, unspecified: Secondary | ICD-10-CM

## 2013-11-09 DIAGNOSIS — B351 Tinea unguium: Secondary | ICD-10-CM

## 2013-11-09 DIAGNOSIS — E1159 Type 2 diabetes mellitus with other circulatory complications: Secondary | ICD-10-CM

## 2013-11-09 DIAGNOSIS — I639 Cerebral infarction, unspecified: Secondary | ICD-10-CM

## 2013-11-09 DIAGNOSIS — F32A Depression, unspecified: Secondary | ICD-10-CM

## 2013-11-09 DIAGNOSIS — G629 Polyneuropathy, unspecified: Secondary | ICD-10-CM

## 2013-11-09 DIAGNOSIS — M79676 Pain in unspecified toe(s): Secondary | ICD-10-CM

## 2013-11-09 DIAGNOSIS — F3289 Other specified depressive episodes: Secondary | ICD-10-CM

## 2013-11-09 DIAGNOSIS — E7800 Pure hypercholesterolemia, unspecified: Secondary | ICD-10-CM

## 2013-11-09 DIAGNOSIS — M79609 Pain in unspecified limb: Secondary | ICD-10-CM

## 2013-11-09 DIAGNOSIS — I4891 Unspecified atrial fibrillation: Secondary | ICD-10-CM

## 2013-11-09 DIAGNOSIS — B353 Tinea pedis: Secondary | ICD-10-CM

## 2013-11-09 DIAGNOSIS — I482 Chronic atrial fibrillation, unspecified: Secondary | ICD-10-CM

## 2013-11-09 DIAGNOSIS — I6350 Cerebral infarction due to unspecified occlusion or stenosis of unspecified cerebral artery: Secondary | ICD-10-CM

## 2013-11-09 LAB — CBC WITH DIFFERENTIAL/PLATELET
BASOS ABS: 0 10*3/uL (ref 0.0–0.1)
BASOS PCT: 0.3 % (ref 0.0–3.0)
Eosinophils Absolute: 0.2 10*3/uL (ref 0.0–0.7)
Eosinophils Relative: 1.6 % (ref 0.0–5.0)
HCT: 36.3 % (ref 36.0–46.0)
HEMOGLOBIN: 12.2 g/dL (ref 12.0–15.0)
LYMPHS PCT: 18.7 % (ref 12.0–46.0)
Lymphs Abs: 2 10*3/uL (ref 0.7–4.0)
MCHC: 33.7 g/dL (ref 30.0–36.0)
MCV: 90.3 fl (ref 78.0–100.0)
MONOS PCT: 9.2 % (ref 3.0–12.0)
Monocytes Absolute: 1 10*3/uL (ref 0.1–1.0)
Neutro Abs: 7.3 10*3/uL (ref 1.4–7.7)
Neutrophils Relative %: 70.2 % (ref 43.0–77.0)
Platelets: 256 10*3/uL (ref 150.0–400.0)
RBC: 4.02 Mil/uL (ref 3.87–5.11)
RDW: 13.4 % (ref 11.5–15.5)
WBC: 10.5 10*3/uL (ref 4.0–10.5)

## 2013-11-09 LAB — BASIC METABOLIC PANEL
BUN: 21 mg/dL (ref 6–23)
CHLORIDE: 94 meq/L — AB (ref 96–112)
CO2: 30 meq/L (ref 19–32)
CREATININE: 0.6 mg/dL (ref 0.4–1.2)
Calcium: 9.8 mg/dL (ref 8.4–10.5)
GFR: 92.77 mL/min (ref >60.00)
Glucose, Bld: 160 mg/dL — ABNORMAL HIGH (ref 70–99)
POTASSIUM: 4.3 meq/L (ref 3.5–5.1)
Sodium: 134 mEq/L — ABNORMAL LOW (ref 135–145)

## 2013-11-09 LAB — HEPATIC FUNCTION PANEL
ALK PHOS: 77 U/L (ref 39–117)
ALT: 16 U/L (ref 0–35)
AST: 25 U/L (ref 0–37)
Albumin: 4.5 g/dL (ref 3.5–5.2)
BILIRUBIN DIRECT: 0.1 mg/dL (ref 0.0–0.3)
Total Bilirubin: 0.7 mg/dL (ref 0.2–1.2)
Total Protein: 7.8 g/dL (ref 6.0–8.3)

## 2013-11-09 LAB — LIPID PANEL
CHOL/HDL RATIO: 2
Cholesterol: 227 mg/dL — ABNORMAL HIGH (ref 0–200)
HDL: 103.2 mg/dL (ref >39.00)
LDL Cholesterol: 110 mg/dL — ABNORMAL HIGH (ref 0–99)
NONHDL: 123.8
Triglycerides: 69 mg/dL (ref 0.0–149.0)
VLDL: 13.8 mg/dL (ref 0.0–40.0)

## 2013-11-09 LAB — HEMOGLOBIN A1C: HEMOGLOBIN A1C: 6.4 % (ref 4.6–6.5)

## 2013-11-09 LAB — TSH: TSH: 2.02 u[IU]/mL (ref 0.35–4.50)

## 2013-11-09 MED ORDER — SERTRALINE HCL 50 MG PO TABS: 50.0000 mg | ORAL_TABLET | Freq: Every day | ORAL | Status: DC

## 2013-11-09 MED ORDER — CLOTRIMAZOLE-BETAMETHASONE 1-0.05 % EX CREA: 1.0000 | TOPICAL_CREAM | Freq: Two times a day (BID) | CUTANEOUS | Status: DC

## 2013-11-09 NOTE — Patient Instructions (Signed)
Athlete's Foot Athlete's foot (tinea pedis) is a fungal infection of the skin on the feet. It often occurs on the skin between the toes or underneath the toes. It can also occur on the soles of the feet. Athlete's foot is more likely to occur in hot, humid weather. Not washing your feet or changing your socks often enough can contribute to athlete's foot. The infection can spread from person to person (contagious). CAUSES Athlete's foot is caused by a fungus. This fungus thrives in warm, moist places. Most people get athlete's foot by sharing shower stalls, towels, and wet floors with an infected person. People with weakened immune systems, including those with diabetes, may be more likely to get athlete's foot. SYMPTOMS   Itchy areas between the toes or on the soles of the feet.  White, flaky, or scaly areas between the toes or on the soles of the feet.  Tiny, intensely itchy blisters between the toes or on the soles of the feet.  Tiny cuts on the skin. These cuts can develop a bacterial infection.  Thick or discolored toenails. DIAGNOSIS  Your caregiver can usually tell what the problem is by doing a physical exam. Your caregiver may also take a skin sample from the rash area. The skin sample may be examined under a microscope, or it may be tested to see if fungus will grow in the sample. A sample may also be taken from your toenail for testing. TREATMENT  Over-the-counter and prescription medicines can be used to kill the fungus. These medicines are available as powders or creams. Your caregiver can suggest medicines for you. Fungal infections respond slowly to treatment. You may need to continue using your medicine for several weeks. PREVENTION   Do not share towels.  Wear sandals in wet areas, such as shared locker rooms and shared showers.  Keep your feet dry. Wear shoes that allow air to circulate. Wear cotton or wool socks. HOME CARE INSTRUCTIONS   Take medicines as directed by  your caregiver. Do not use steroid creams on athlete's foot.  Keep your feet clean and cool. Wash your feet daily and dry them thoroughly, especially between your toes.  Change your socks every day. Wear cotton or wool socks. In hot climates, you may need to change your socks 2 to 3 times per day.  Wear sandals or canvas tennis shoes with good air circulation.  If you have blisters, soak your feet in Burow's solution or Epsom salts for 20 to 30 minutes, 2 times a day to dry out the blisters. Make sure you dry your feet thoroughly afterward. SEEK MEDICAL CARE IF:   You have a fever.  You have swelling, soreness, warmth, or redness in your foot.  You are not getting better after 7 days of treatment.  You are not completely cured after 30 days.  You have any problems caused by your medicines. MAKE SURE YOU:   Understand these instructions.  Will watch your condition.  Will get help right away if you are not doing well or get worse. Document Released: 01/31/2000 Document Revised: 04/27/2011 Document Reviewed: 11/21/2010 [facility] Patient Information 2015 [place], [place]. This information is not intended to replace advice given to you by your health care provider. Make sure you discuss any questions you have with your health care provider. Diabetes and Foot Care Diabetes may cause you to have problems because of poor blood supply (circulation) to your feet and legs. This may cause the skin on your feet to become  thinner, break easier, and heal more slowly. Your skin may become dry, and the skin may peel and crack. You may also have nerve damage in your legs and feet causing decreased feeling in them. You may not notice minor injuries to your feet that could lead to infections or more serious problems. Taking care of your feet is one of the most important things you can do for yourself.  HOME CARE INSTRUCTIONS  Wear shoes at all times, even in the house. Do not go barefoot. Bare feet are  easily injured.  Check your feet daily for blisters, cuts, and redness. If you cannot see the bottom of your feet, use a mirror or ask someone for help.  Wash your feet with warm water (do not use hot water) and mild soap. Then pat your feet and the areas between your toes until they are completely dry. Do not soak your feet as this can dry your skin.  Apply a moisturizing lotion or petroleum jelly (that does not contain alcohol and is unscented) to the skin on your feet and to dry, brittle toenails. Do not apply lotion between your toes.  Trim your toenails straight across. Do not dig under them or around the cuticle. File the edges of your nails with an emery board or nail file.  Do not cut corns or calluses or try to remove them with medicine.  Wear clean socks or stockings every day. Make sure they are not too tight. Do not wear knee-high stockings since they may decrease blood flow to your legs.  Wear shoes that fit properly and have enough cushioning. To break in new shoes, wear them for just a few hours a day. This prevents you from injuring your feet. Always look in your shoes before you put them on to be sure there are no objects inside.  Do not cross your legs. This may decrease the blood flow to your feet.  If you find a minor scrape, cut, or break in the skin on your feet, keep it and the skin around it clean and dry. These areas may be cleansed with mild soap and water. Do not cleanse the area with peroxide, alcohol, or iodine.  When you remove an adhesive bandage, be sure not to damage the skin around it.  If you have a wound, look at it several times a day to make sure it is healing.  Do not use heating pads or hot water bottles. They may burn your skin. If you have lost feeling in your feet or legs, you may not know it is happening until it is too late.  Make sure your health care provider performs a complete foot exam at least annually or more often if you have foot  problems. Report any cuts, sores, or bruises to your health care provider immediately. SEEK MEDICAL CARE IF:   You have an injury that is not healing.  You have cuts or breaks in the skin.  You have an ingrown nail.  You notice redness on your legs or feet.  You feel burning or tingling in your legs or feet.  You have pain or cramps in your legs and feet.  Your legs or feet are numb.  Your feet always feel cold. SEEK IMMEDIATE MEDICAL CARE IF:   There is increasing redness, swelling, or pain in or around a wound.  There is a red line that goes up your leg.  Pus is coming from a wound.  You develop a fever  or as directed by your health care provider.  You notice a bad smell coming from an ulcer or wound. Document Released: 01/31/2000 Document Revised: 10/05/2012 Document Reviewed: 07/12/2012 [facility] Patient Information 2015 [place], [place]. This information is not intended to replace advice given to you by your health care provider. Make sure you discuss any questions you have with your health care provider.

## 2013-11-09 NOTE — Progress Notes (Signed)
   Subjective:    Patient ID: [name], female    DOB: 1922/10/06, 80 y.o.   MRN: [id number]  HPI Comments: [name], 80 year old female, presents the office today for complaints of painful elongated nails, dry cracked skin on the bottom of her feet. She states that her feet periodically burn when they crack. She has been soaking in Epson salts, using multiple foot creams and Neosporin. States that she's had a stent placed in her right leg and they are considering having a stent and her left leg due to swelling. She also has significant left knee pain, which is ongoing. Denies any drainage in the area or any open lesions. Denies any systemic complaints such as fevers, chills, nausea, vomiting. No other complaints at this time.  Foot Pain      Review of Systems  HENT: Positive for sinus pressure.   Eyes: Positive for visual disturbance.  Cardiovascular: Positive for leg swelling.       Calf pain   Endocrine: Positive for cold intolerance and heat intolerance.  Musculoskeletal:       Joint pain Difficulty walking  Skin:       Open sores   Allergic/Immunologic: Positive for environmental allergies.  Psychiatric/Behavioral: The patient is nervous/anxious.   All other systems reviewed and are negative.      Objective:   Physical Exam AAO x3, NAD DP/PT pulses decreased. CRT < 3sec Decreased protective sensation with Derrel Nip monofilament, decreased vibratory sensation. Nails hypertrophic, dystrophic, elongated, yellow discoloration x10. No swelling erythema or drainage. Dry scaly skin on the plantar aspect of the foot with slight are the edema overlying the healed areas of skin consistent with tinea pedis. There is no open lesion identified at this time. No interdigital maceration. No calf pain with compression. Mild chronic bilateral lower extremity edema. No increase in warmth to the area.        Assessment & Plan:  80 year old female with symptomatic  onychomycosis, likely tinea pedis. -Various treatment options were discussed with the patient and her son in detail including alternatives, risks, complications. -Nail sharply debrided E36 without complications. -Prescribed Lotrisone cream for likely tinea pedis. -Discussed importance of daily foot inspection. -Followup with vascular surgery for possible stenting per patient. -Followup with orthopedic surgery for left knee. -Followup in 3 months or sooner if any palms are to arise or any changes symptoms. In the meantime call any questions, concerns.

## 2013-11-09 NOTE — Telephone Encounter (Signed)
pts son called wanting to know if cream was sent to pharmacy. Left message letting him know cream was sent to [facility].

## 2013-11-10 ENCOUNTER — Encounter: Payer: Self-pay | Admitting: *Deleted

## 2013-11-13 ENCOUNTER — Other Ambulatory Visit: Payer: Self-pay | Admitting: *Deleted

## 2013-11-13 MED ORDER — SERTRALINE HCL 50 MG PO TABS: 50.0000 mg | ORAL_TABLET | Freq: Every day | ORAL | Status: DC

## 2014-01-01 ENCOUNTER — Telehealth: Payer: Self-pay | Admitting: *Deleted

## 2014-01-01 NOTE — Telephone Encounter (Signed)
If increased pain, she will need to be evaluated.  Since I have seen her on multiple occasions, I can see her again or I can refer her to ortho for further evaluation.

## 2014-01-01 NOTE — Telephone Encounter (Signed)
pts daughter called states pt is in constant pain.  States she aches from her hips radiating to feet.  States she is having leg and feet swelling.  Please advise

## 2014-01-01 NOTE — Telephone Encounter (Signed)
Spoke with daughter & she states that she will talk it over with her brother & get back with [place] on which way to proceed. She also stated that she has seen Ortho back in July & he only gave her some cream for her cracked feet.

## 2014-01-03 ENCOUNTER — Emergency Department: Payer: Self-pay | Admitting: Internal Medicine

## 2014-01-03 LAB — BASIC METABOLIC PANEL
ANION GAP: 7 (ref 7–16)
BUN: 15 mg/dL (ref 7–18)
CREATININE: 0.68 mg/dL (ref 0.60–1.30)
Calcium, Total: 9.9 mg/dL (ref 8.5–10.1)
Chloride: 95 mmol/L — ABNORMAL LOW (ref 98–107)
Co2: 30 mmol/L (ref 21–32)
EGFR (African American): >60
GLUCOSE: 142 mg/dL — AB (ref 65–99)
Osmolality: 268 (ref 275–301)
Potassium: 4.4 mmol/L (ref 3.5–5.1)
Sodium: 132 mmol/L — ABNORMAL LOW (ref 136–145)

## 2014-01-03 LAB — CBC
HCT: 36.1 % (ref 35.0–47.0)
HGB: 11.9 g/dL — AB (ref 12.0–16.0)
MCH: 28.6 pg (ref 26.0–34.0)
MCHC: 32.9 g/dL (ref 32.0–36.0)
MCV: 87 fL (ref 80–100)
Platelet: 287 10*3/uL (ref 150–440)
RBC: 4.16 10*6/uL (ref 3.80–5.20)
RDW: 13.7 % (ref 11.5–14.5)
WBC: 12.1 10*3/uL — ABNORMAL HIGH (ref 3.6–11.0)

## 2014-01-03 LAB — PRO B NATRIURETIC PEPTIDE: B-TYPE NATIURETIC PEPTID: 2784 pg/mL — AB (ref 0–450)

## 2014-01-03 LAB — TROPONIN I: Troponin-I: <0.02 ng/mL

## 2014-01-18 ENCOUNTER — Telehealth: Payer: Self-pay | Admitting: Internal Medicine

## 2014-01-18 NOTE — Telephone Encounter (Signed)
ER records requested (see below)

## 2014-01-18 NOTE — Telephone Encounter (Signed)
I can see her on 01/31/14 at 4:15.  May have to wait.  Being worked in.

## 2014-01-18 NOTE — Telephone Encounter (Addendum)
Pt needs ER follow up for  dx edema. Pt d/c 11/19. Request for appt after 4pm. Please advise where to add pt to the schedule/msn

## 2014-01-22 ENCOUNTER — Telehealth: Payer: Self-pay | Admitting: Internal Medicine

## 2014-01-22 NOTE — Telephone Encounter (Signed)
left msg to call office for appt details 12/7.msn

## 2014-01-25 ENCOUNTER — Emergency Department: Payer: Self-pay | Admitting: Emergency Medicine

## 2014-01-26 ENCOUNTER — Ambulatory Visit (INDEPENDENT_AMBULATORY_CARE_PROVIDER_SITE_OTHER): Payer: Medicare Other | Admitting: Internal Medicine

## 2014-01-26 ENCOUNTER — Encounter: Payer: Self-pay | Admitting: Internal Medicine

## 2014-01-26 VITALS — BP 124/60 | HR 80 | Temp 97.8°F | Ht 66.0 in

## 2014-01-26 DIAGNOSIS — E871 Hypo-osmolality and hyponatremia: Secondary | ICD-10-CM

## 2014-01-26 DIAGNOSIS — I482 Chronic atrial fibrillation, unspecified: Secondary | ICD-10-CM

## 2014-01-26 DIAGNOSIS — I739 Peripheral vascular disease, unspecified: Secondary | ICD-10-CM

## 2014-01-26 DIAGNOSIS — E1159 Type 2 diabetes mellitus with other circulatory complications: Secondary | ICD-10-CM

## 2014-01-26 DIAGNOSIS — I639 Cerebral infarction, unspecified: Secondary | ICD-10-CM

## 2014-01-26 DIAGNOSIS — F32A Depression, unspecified: Secondary | ICD-10-CM

## 2014-01-26 DIAGNOSIS — I2510 Atherosclerotic heart disease of native coronary artery without angina pectoris: Secondary | ICD-10-CM

## 2014-01-26 DIAGNOSIS — R609 Edema, unspecified: Secondary | ICD-10-CM

## 2014-01-26 DIAGNOSIS — E7800 Pure hypercholesterolemia, unspecified: Secondary | ICD-10-CM

## 2014-01-26 DIAGNOSIS — M79671 Pain in right foot: Secondary | ICD-10-CM

## 2014-01-26 DIAGNOSIS — I10 Essential (primary) hypertension: Secondary | ICD-10-CM

## 2014-01-26 DIAGNOSIS — G629 Polyneuropathy, unspecified: Secondary | ICD-10-CM

## 2014-01-26 DIAGNOSIS — F329 Major depressive disorder, single episode, unspecified: Secondary | ICD-10-CM

## 2014-01-26 MED ORDER — FUROSEMIDE 20 MG PO TABS: 20.0000 mg | ORAL_TABLET | Freq: Every day | ORAL | Status: DC

## 2014-01-26 MED ORDER — POTASSIUM CHLORIDE ER 10 MEQ PO TBCR: 10.0000 meq | EXTENDED_RELEASE_TABLET | Freq: Every day | ORAL | Status: DC

## 2014-01-26 MED ORDER — TRAMADOL HCL 50 MG PO TABS: 50.0000 mg | ORAL_TABLET | Freq: Two times a day (BID) | ORAL | Status: DC | PRN

## 2014-01-26 NOTE — Patient Instructions (Signed)
Stop the amlodipine.    Start lasix one per day.  Take in the am.   Start potassium (kcl) one per day.    Take tylenol extra strength - 2 tablets 2x/day  Take tramadol - one tablet 2x/day

## 2014-01-26 NOTE — Progress Notes (Signed)
Pre visit review using our clinic review tool, if applicable. No additional management support is needed unless otherwise documented below in the visit note. 

## 2014-01-27 ENCOUNTER — Other Ambulatory Visit: Payer: Self-pay | Admitting: Internal Medicine

## 2014-01-27 DIAGNOSIS — E871 Hypo-osmolality and hyponatremia: Secondary | ICD-10-CM

## 2014-01-27 DIAGNOSIS — R609 Edema, unspecified: Secondary | ICD-10-CM

## 2014-01-27 LAB — HEPATIC FUNCTION PANEL
ALK PHOS: 82 U/L (ref 39–117)
ALT: 17 U/L (ref 0–35)
AST: 23 U/L (ref 0–37)
Albumin: 4.4 g/dL (ref 3.5–5.2)
BILIRUBIN TOTAL: 0.6 mg/dL (ref 0.2–1.2)
Bilirubin, Direct: 0.2 mg/dL (ref 0.0–0.3)
Indirect Bilirubin: 0.4 mg/dL (ref 0.2–1.2)
Total Protein: 7.1 g/dL (ref 6.0–8.3)

## 2014-01-27 LAB — BASIC METABOLIC PANEL
BUN: 27 mg/dL — ABNORMAL HIGH (ref 6–23)
CALCIUM: 9.6 mg/dL (ref 8.4–10.5)
CHLORIDE: 92 meq/L — AB (ref 96–112)
CO2: 26 meq/L (ref 19–32)
CREATININE: 1.03 mg/dL (ref 0.50–1.10)
GLUCOSE: 129 mg/dL — AB (ref 70–99)
Potassium: 4.5 mEq/L (ref 3.5–5.3)
Sodium: 131 mEq/L — ABNORMAL LOW (ref 135–145)

## 2014-01-27 LAB — TSH: TSH: 7.718 u[IU]/mL — ABNORMAL HIGH (ref 0.350–4.500)

## 2014-01-27 NOTE — Progress Notes (Signed)
Order placed for f/u met b.  

## 2014-01-28 DIAGNOSIS — R609 Edema, unspecified: Secondary | ICD-10-CM | POA: Insufficient documentation

## 2014-01-28 LAB — URINALYSIS, ROUTINE W REFLEX MICROSCOPIC
BILIRUBIN URINE: NEGATIVE
Glucose, UA: NEGATIVE mg/dL
HGB URINE DIPSTICK: NEGATIVE
KETONES UR: NEGATIVE mg/dL
NITRITE: NEGATIVE
Protein, ur: NEGATIVE mg/dL
SPECIFIC GRAVITY, URINE: 1.018 (ref 1.005–1.030)
Urobilinogen, UA: 1 mg/dL (ref 0.0–1.0)
pH: 5.5 (ref 5.0–8.0)

## 2014-01-28 LAB — URINALYSIS, MICROSCOPIC ONLY
BACTERIA UA: NONE SEEN
Squamous Epithelial / LPF: NONE SEEN

## 2014-01-28 LAB — CULTURE, URINE COMPREHENSIVE
Colony Count: NO GROWTH
ORGANISM ID, BACTERIA: NO GROWTH

## 2014-01-28 NOTE — Progress Notes (Signed)
Subjective:    Patient ID: [name], female    DOB: Jul 27, 1922, 80 y.o.   MRN: [id number]  HPI 80 year old female with past history of CAD s/p bypass in 10/99.  She also has a history of hypertension, hypercholesterolemia, atrial fibrillation and previous presumed TIA/CVA (2008).  Was recently admitted with another CVA.  Was started on Eloquis.  Was in rehab for a short period.  She comes in today as a work in for f/u of recent ER visit for lower extremity swelling and pain.  Was seen in the ER a few weeks ago and yesterday.  Having problems with increased pedal and lower extremity swelling.  Increased pain in her feel and legs.  More pain recently in her right foot.  Heard something pop.  Had an xray in ER.  Some arthritis changes, but no acute fracture.  She is accompanied by her son and daughter.  History obtained from all three of them.  Also having some increased pain in her left knee and hip. No increased sob.  No chest pain or tightness.  In increased heart rate or palpitations.     Past Medical History  Diagnosis Date  . Type II or unspecified type diabetes mellitus without mention of complication, not stated as uncontrolled   . Essential hypertension, benign   . Anemia, unspecified   . Atrial fibrillation   . Hypercholesterolemia   . Osteoarthritis   . History of TIAs   . CVA (cerebral infarction)   . Atrial fibrillation   . Uterine cancer     s/p TAH/BSO  . CAD (coronary artery disease)     s/p CABG 10/99 with LIMA to the LAD, SVG to OM1, SVG to distal RCA    Current Outpatient Prescriptions on File Prior to Visit  Medication Sig Dispense Refill  . apixaban (ELIQUIS) 2.5 MG TABS tablet TAKE 1 TABLET BY MOUTH TWICE A DAY 60 tablet 5  . clotrimazole-betamethasone (LOTRISONE) cream Apply 1 application topically 2 (two) times daily. 30 g 0  . glucose blood test strip 1 each by Other route as needed for other. Use one test strip bid    . hydrochlorothiazide (HYDRODIURIL)  25 MG tablet Take 1 tablet (25 mg total) by mouth daily. 90 tablet 3  . lisinopril (PRINIVIL,ZESTRIL) 40 MG tablet Take 1 tablet (40 mg total) by mouth every morning. 90 tablet 3  . metoprolol tartrate (LOPRESSOR) 25 MG tablet Take 1 tablet (25 mg total) by mouth 2 (two) times daily. 180 tablet 3  . senna-docusate (SENOKOT-S) 8.6-50 MG per tablet Take 1 tablet by mouth 2 (two) times daily. 60 tablet 3  . sertraline (ZOLOFT) 50 MG tablet Take 1 tablet (50 mg total) by mouth daily. 90 tablet 0   No current facility-administered medications on file prior to visit.    Review of Systems Patient denies any headache, lightheadedness or dizziness.  No chest pain, tightness or palpatations.  No increased shortness of breath, cough or congestion.  No nausea or vomiting.  No acid reflux.  No abdominal pain or cramping.  No bowel change, such as diarrhea, constipation, BRBPR or melana.  Does report increased swelling and pain in her legs and feet.  See above.  Increased pain in her right foot.  Was given lasix on her first visit to ER.  Swelling improved.  Has not had any lasix in a couple of weeks.  No medication changes made yesterday.       Objective:  Physical Exam Filed Vitals:   01/26/14 1459  BP: 124/60  Pulse: 80  Temp: 97.8 F (42.63 C)   80 year old female in no acute distress.   HEENT:  Nares- clear.  Oropharynx - without lesions. NECK:  Supple.  Nontender.  No audible bruit.  HEART:  Rate controlled.   LUNGS:  No crackles or wheezing audible.  Respirations even and unlabored.  RADIAL PULSE:  Equal bilaterally.  ABDOMEN:  Soft, nontender.  Bowel sounds present and normal.  No audible abdominal bruit.  EXTREMITIES:  Increased pedal and lower extremity edema.   DP pulses not appreciated.  Increased pain to palpation - right foot and lower extremities.  No increased erythema.         Assessment & Plan:  1. Edema Worsening lower extremity edema.  Feel may be multifactorial.  Will hold  amlodipine for now.  Check thyroid function, liver function and kidney function.  Discussed wearing support hose.  Refer back to vascular surgery.  Lasix 22m q day. (with potassium).  Follow.   - TSH - Hepatic function panel - Basic metabolic panel - Urinalysis, Routine w reflex microscopic - CULTURE, URINE COMPREHENSIVE  2. Chronic atrial fibrillation Rate controlled.  On Eloquis.  Sees cardiology.    3. Essential hypertension, benign Blood pressure doing much better.  Follow.  Check met b.  Stop amlodipine.    4. Coronary artery disease involving native coronary artery of native heart without angina pectoris Stable.  Continue risk factor modification.  Sees cardiology.   5. Peripheral vascular disease Cannot appreciate a DP pulse.  Is s/p stent.  Refer back to vascular surgery.   6. CVA (cerebral vascular accident) Stable.  Follow.    7. Type 2 diabetes mellitus with other circulatory complications Follow met b and a1c.    8. Neuropathy Increased pain.  May be multifactorial.  Have discussed gabapentin.  Pursue above w/up.    9. Hypercholesterolemia Low cholesterol diet.  Follow.  Has declined cholesterol medication.   10. Depression On zoloft.  Stable.    11. Hyponatremia Recheck today.    12.  FOOT PAIN.  Xray as outlined.  Son reports concern over a possible stress fracture.  Tramadol helps the pain.  Refilled.  Tolerates.  Refer to podiatry.  Lasix as outlined.  Also refer to vascular surgery.    HEALTH MAINTENANCE.  Physical 03/31/13.  Has declined mammogram and further GI evaluation.  Declined bone density.  I spent over 40 minutes with the patient and her family and more than 50% of the time was spent in consultation regarding the above.

## 2014-01-31 ENCOUNTER — Ambulatory Visit: Payer: Medicare Other | Admitting: Internal Medicine

## 2014-02-07 ENCOUNTER — Encounter: Payer: Self-pay | Admitting: *Deleted

## 2014-02-12 ENCOUNTER — Ambulatory Visit: Payer: Medicare Other | Admitting: Internal Medicine

## 2014-02-19 ENCOUNTER — Other Ambulatory Visit: Payer: Self-pay | Admitting: Internal Medicine

## 2014-03-08 ENCOUNTER — Ambulatory Visit: Payer: Medicare Other | Admitting: Internal Medicine

## 2014-04-16 ENCOUNTER — Other Ambulatory Visit: Payer: Self-pay | Admitting: Internal Medicine

## 2014-05-07 ENCOUNTER — Telehealth: Payer: Self-pay | Admitting: *Deleted

## 2014-05-07 NOTE — Telephone Encounter (Signed)
Daughter [name]) called to report that [name] is having trouble sleeping. Wants to know if she can take Melatonin 3mg  nightly along with her current medications. Please advise.

## 2014-05-08 ENCOUNTER — Other Ambulatory Visit: Payer: Self-pay | Admitting: *Deleted

## 2014-05-08 ENCOUNTER — Telehealth: Payer: Self-pay | Admitting: Internal Medicine

## 2014-05-08 MED ORDER — METOPROLOL TARTRATE 25 MG PO TABS: 25.0000 mg | ORAL_TABLET | Freq: Two times a day (BID) | ORAL | Status: DC

## 2014-05-08 NOTE — Telephone Encounter (Signed)
Is she still taking ultram (tramadol).  Would not take melatonin and tramadol together.  She can try a low dose of melatonin, but do not combine with any other sleeping medication.  Will need to be monitored when first takes, to make sure does not make her dizzy, etc.

## 2014-05-08 NOTE — Telephone Encounter (Signed)
Pt's daughter notified.

## 2014-05-08 NOTE — Telephone Encounter (Signed)
Per daughter, patient stopped taking the Tramadol because it made her "loopy headed"

## 2014-05-08 NOTE — Telephone Encounter (Signed)
Patient need a refill of Metoprolol, patient is having trouble sleeping, is it ok to take melatonin with medication she already taking. Please advise

## 2014-05-08 NOTE — Telephone Encounter (Signed)
Rx refilled for a 90 day supply. Last seen in December & next appt in May. See other phone note for response to sleeping medication

## 2014-06-08 NOTE — H&P (Signed)
PATIENT NAME:  [name], [name] MR#:  [id number] DATE OF BIRTH:  [birth year]  DATE OF ADMISSION:  01/02/2013  PRIMARY CARE PHYSICIAN: Einar Pheasant, MD  CHIEF COMPLAINT: Unable to think straight.  HISTORY OF PRESENT ILLNESS: This is an 81 year old female who states that she woke up this morning feeling this way, that she cannot get her thoughts straight in her head. She has had some confusion. She has trouble speaking her words, questionable whether there is slurred speech or not. She just cannot get the things out that she wants to say. She does feel generalized weakness, but not worse one side versus the other. On 11/01 she started taking gabapentin 100 mg at bedtime. She was brought in by family for her altered mental status. In the ER, she had a CT scan that showed an old stroke, but nothing acute.  PAST MEDICAL HISTORY: Hypertension, hyperlipidemia, coronary artery disease, old CVA, atrial fibrillation, pain in the legs, and arthritis.   PAST SURGICAL HISTORY: CABG in 1999, veins on the legs, hysterectomy, and cataracts.   ALLERGIES: No known drug allergies.   MEDICATIONS: As per prescription writer include: Amlodipine 2.5 mg daily, aspirin 325 mg daily, gabapentin 100 mg at bedtime, multivitamin 1 tablet daily. The patient also takes an over-the-counter cholesterol medication, polycosanol, she thinks the name of it is.   SOCIAL HISTORY: No smoking. No alcohol. No drug use. Lives with her daughter. Used to work in a Special educational needs teacher.   FAMILY HISTORY: Mother died at 80 of a MI. Father possibly died of a CVA, unknown age.   REVIEW OF SYSTEMS: CONSTITUTIONAL: Positive for fever. No chills. No sweats. Positive for weakness, generalized. EYES: Both eyes decreased vision, gritty-type feeling in her eyes. EARS, NOSE, MOUTH, AND THROAT: No sore throat. No difficulty swallowing. Difficulty getting her words out.  CARDIOVASCULAR: No chest pain. No palpitations.  RESPIRATORY: Positive for shortness  of breath. Positive for cough, clear phlegm. No hemoptysis.  GASTROINTESTINAL: Positive for abdominal pain. No nausea. No vomiting. No diarrhea. No constipation. No bright red blood per rectum. No melena.  GENITOURINARY: No burning on urination. No hematuria.  MUSCULOSKELETAL: Positive for arthritis pain.  INTEGUMENT: No rashes or eruptions.  NEUROLOGIC: Positive for drowsy feeling and difficulty getting out words.  PSYCHIATRIC: No anxiety or depression.  ENDOCRINE: No thyroid problems.  HEMATOLOGIC AND LYMPHATIC: No anemia. No easy bruising or bleeding.   PHYSICAL EXAMINATION: VITAL SIGNS: Temperature 98.1, pulse 83, respirations 16, blood pressure 137/76, pulse ox 95% on room air.  GENERAL: No respiratory distress.  EYES: Conjunctivae and lids normal. Pupils equal, round, and reactive to light. Extraocular muscles intact. No nystagmus.  EARS, NOSE, MOUTH, AND THROAT: Tympanic membranes no erythema. Nasal mucosa no erythema. Throat no erythema. No exudate seen. Lips and gums no lesions.  NECK: No JVD. No bruits. No lymphadenopathy. No thyromegaly. No thyroid nodules palpated.  LUNGS: Clear to auscultation. No use of accessory muscles to breathe. No rhonchi, rales, or wheeze heard.  CARDIOVASCULAR: S1 and S2, irregularly irregular. No gallops, rubs, or murmurs heard. Carotid upstroke 2+ bilaterally. No bruits. Dorsalis pedis pulses 2+ bilaterally. Trace edema. GASTROINTESTINAL: Abdomen is soft, nontender. No organosplenomegaly. Normoactive bowel sounds. No masses felt.  LYMPHATIC: No lymph nodes in the neck.  MUSCULOSKELETAL: No clubbing. Trace edema. No cyanosis.  SKIN: Chronic lower extremity discoloration. NEUROLOGIC: Cranial nerves II through XII grossly intact. Deep tendon reflexes 2+ bilateral lower extremities. Babinski negative bilaterally. Power 4+/5 bilateral upper and lower extremities.  PSYCHIATRIC: The patient  is alert and oriented to person, place, and time.  LABORATORY AND  RADIOLOGICAL DATA: A CT scan of the head showed a prior left frontal lobe infarct. No acute findings.   Urinalysis negative. White blood cell count 10, H and H 12.1 and 36.4, platelet count 222. Glucose 143, BUN 23, creatinine 0.6, sodium 133, potassium 4.3, chloride 98, CO2 27, calcium 9.3. Liver function tests in normal range. INR 1.  EKG showed atrial fibrillation, 112 beats per minute, anterior septal infarct.   ASSESSMENT AND PLAN: 1.  Acute encephalopathy. This could be 2 things. It could be in acute cerebrovascular accident versus an adverse medication reaction with gabapentin. For the suspected cerebrovascular accident, will get a MRI of the brain, carotid ultrasound, and echocardiogram, PT, OT, and speech therapy evaluation. Since the patient is already on aspirin 325 mg, I will add Plavix 75 mg stat and daily and decrease the aspirin down to 81 mg. Since the patient does have a history of atrial fibrillation, will have to consider either Coumadin, Pradaxa, Xarelto, or Eliquis, if this is an acute stroke for further stroke prevention. I will also stop gabapentin at this time just in case this is an adverse medication reaction to the gabapentin, which I have seen this before in the past.  2.  Hypertension. Continue low-dose amlodipine.  3.  Hyperlipidemia. Check a lipid profile and start Lipitor.  4.  Atrial fibrillation. Currently rate controlled. Not on any rate controlling medications at this point.  5.  Impaired fasting glucose. Will check a Hemoglobin A1c and put on sliding scale.  CODE STATUS: The patient is a FULL CODE.   TIME SPENT ON ADMISSION: 60 minutes.  ____________________________ Tana Conch. Leslye Peer, MD rjw:[id number] D: 01/02/2013 11:31:02 ET T: 01/02/2013 12:01:51 ET JOB#: [id number]  cc: Tana Conch. Leslye Peer, MD, <Dictator> Einar Pheasant, MD Marisue Brooklyn MD ELECTRONICALLY SIGNED 01/03/2013 18:54

## 2014-06-08 NOTE — Consult Note (Signed)
Referring Physician:  Loletha Grayer :   Primary Care Physician:  Einar Pheasant : [facility], [address], [place], [place] [address], [place] [phone]  Reason for Consult: Admit Date: 02-Jan-2013  Chief Complaint: AMS  Reason for Consult: CVA   History of Present Illness: History of Present Illness:   81 year old woman presented with difficulty getting her words out along with some confusion.  She was admitted and had Brain MRI which showed acute stroke.  No focal weakness.  Since admission, speech issue has improved, but still some confusion per her son who is at bedside this evening.  Symptoms constant.  Confusion is mild.  Nothing makes symptoms better or worse.  No numbness or tingling.  No facial droop.  Patient noted to have afib.  Takes ASA 325 mg daily. MEDICAL HISTORY:  SURGICAL HISTORY: in 1999 HISTORY: tobacco, drugs or alcohol.with daughter. HISTORY: - MI- CVA 2.5 mg daily325 mg daily100 mg at bedtime     ROS:  General denies complaints   HEENT no complaints   Lungs no complaints   Cardiac no complaints   GI no complaints   GU no complaints   Musculoskeletal no complaints   Extremities no complaints   Skin no complaints   Endocrine no complaints   Psych no complaints   Past Medical/Surgical Hx:  afib:   CAD:   HTN:   CABG (Coronary Artery Bypass Graft):   Hysterectomy - Total:   Home Medications: Medication Instructions Last Modified Date/Time  gabapentin 100 mg oral capsule 1 cap(s) orally once a day (at bedtime) 17-Nov-14 08:17  aspirin 325 mg oral tablet 1 tab(s) orally once a day 17-Nov-14 08:17  multivitamin 1 tab(s) orally once a day 17-Nov-14 08:17  amLODIPine 2.5 mg oral tablet 1 tab(s) orally once a day 17-Nov-14 08:17   [facility] Current Meds:  Sodium Chloride 0.9%, 1000 ml at 50 ml/hr  Acetaminophen * tablet, ( Tylenol (325 mg) tablet)  650 mg Oral q4h PRN for pain or temp. greater than 100.4  -  Indication: Pain/Fever  Aspirin Chewable, 81 mg Oral daily  - Indication: Pain/Fever/Thromboembolic Disorders/Post MI/Prophylaxis MI  Ondansetron injection, ( Zofran injection )  4 mg, IV push, q4h PRN for Nausea/Vomiting  Indication: Nausea/ Vomiting  amLODIPine tablet, ( Norvasc)  2.5 mg Oral daily  - Indication: Hypertension/ Angina  atorvaSTATin tablet,  ( Lipitor)  20 mg Oral at bedtime  - Indication: Hypercholesterolemia  Multivitamin tablet, ( Vitamin - Multiple)  1 tablet(s) Oral daily  - Indication: Prevention and Treatment of Vitamin Deficiencies  Insulin SS -Novolog injection, Subcutaneous, FSBS before meals and at bedtime  0 units if FSBS 0-150     2 unit(s) if FSBS 151 - 200     4 unit(s) if FSBS 201 - 250     6 unit(s) if FSBS 251 - 300     8 unit(s) if FSBS 301 - 350     10 unit(s) if FSBS 351 - 400  Call MD if FSBS is greater than 400, [Waste Code: Black]  Nursing Saline Flush, 3 to 6 ml, IV push, Q1M PRN for IV Maintenance  Clopidogrel tablet,  ( pLAVix)  75 mg Oral daily  Instructions:  Initiate Bleeding Precautions Protocol  Allergies:  Macrolide antibiotics: Other  Erythromycin: None  Vital Signs: **Vital Signs.:   17-Nov-14 15:15  Vital Signs Type Q 4hr  Temperature Temperature (F) 97.4  Celsius 36.3  Temperature Source oral  Pulse  Pulse 112  Respirations Respirations 18  Systolic BP Systolic BP 161  Diastolic BP (mmHg) Diastolic BP (mmHg) 75  Mean BP 102  Pulse Ox % Pulse Ox % 95  Pulse Ox Activity Level  At rest  Oxygen Delivery Room Air/ 21 %   EXAM: GENERAL: Pleasant woman.  In NAD.  Normocephalic and atraumatic.  EYES: Funduscopic exam shows normal disc size, appearance and C/D ratio without clear evidence of papilledema.  CARDIOVASCULAR: S1 and S2 sounds are within normal limits, without murmurs, gallops, or rubs.  MUSCULOSKELETAL: Bulk - Normal Tone - Normal Pronator Drift - Absent bilaterally. Ambulation - Deferred due to  falls risk.  R/L 5/5    Shoulder abduction (deltoid/supraspinatus, axillary/suprascapular n, C5) 5/5    Elbow flexion (biceps brachii, musculoskeletal n, C5-6) 5/5    Elbow extension (triceps, radial n, C7) 5/5    Finger adduction (interossei, ulnar n, T1)   5/5    Hip flexion (iliopsoas, L1/L2) 5/5    Knee flexion (hamstrings, sciatic n, L5/S1) 5/5    Knee extension (quadriceps, femoral n, L3/4) 5/5    Ankle dorsiflexion (tibialis anterior, deep fibular n, L4/5) 5/5    Ankle plantarflexion (gastroc, tibial n, S1)  NEUROLOGICAL: MENTAL STATUS: Patient is oriented to person, place and time.  Recent memory is mildly diminished.  Remote memory is intact.  Attention span and concentration are intact.  Naming, repetition, comprehension and expressive speech are within normal limits.  Patient's fund of knowledge is within normal limits for educational level.  CRANIAL NERVES: Normal    CN II (normal visual acuity and visual fields) Normal    CN III, IV, VI (extraocular muscles are intact) Normal    CN V (facial sensation is intact bilaterally) Normal    CN VII (facial strength is intact bilaterally) Normal    CN VIII (hearing is intact bilaterally) Normal    CN IX/X (palate elevates midline, normal phonation) Normal    CN XI (shoulder shrug strength is normal and symmetric) Normal    CN XII (tongue protrudes midline)   SENSATION: Intact to pain and temp bilaterally (spinothalamic tracts) Intact to position and vibration bilaterally (dorsal columns)   REFLEXES: R/L 2+/2+    Biceps 2+/2+    Brachioradialis   2+/2+    Patellar 2+/2+    Achilles   COORDINATION/CEREBELLAR: Finger to nose testing is within normal limits..  Lab Results:  Hepatic:  17-Nov-14 07:53   Bilirubin, Total 0.6  Alkaline Phosphatase 107  SGPT (ALT) 29  SGOT (AST) 36  Total Protein, Serum 7.3  Albumin, Serum 3.9  Routine Chem:  17-Nov-14 07:53   Glucose, Serum  143  BUN  23  Creatinine (comp) 0.60   Sodium, Serum  133  Potassium, Serum 4.3  Chloride, Serum 98  CO2, Serum 27  Calcium (Total), Serum 9.3  Osmolality (calc) 273  eGFR (African American) >60  eGFR (Non-African American) >60 (eGFR values <81m/min/1.73 m2 may be an indication of chronic kidney disease (CKD). Calculated eGFR is useful in patients with stable renal function. The eGFR calculation will not be reliable in acutely ill patients when serum creatinine is changing rapidly. It is not useful in  patients on dialysis. The eGFR calculation may not be applicable to patients at the low and high extremes of body sizes, pregnant women, and vegetarians.)  Result Comment POTASSIUM/AST - Slight hemolysis, interpret results with  - caution.  Result(s) reported on 02 Jan 2013 at 08:38AM.  Anion Gap 8  Routine UA:  17-Nov-14 07:53   Color (UA) Yellow  Clarity (UA) Hazy  Glucose (UA) Negative  Bilirubin (UA) Negative  Ketones (UA) Negative  Specific Gravity (UA) 1.017  Blood (UA) Negative  pH (UA) 7.0  Protein (UA) Negative  Nitrite (UA) Negative  Leukocyte Esterase (UA) Negative (Result(s) reported on 02 Jan 2013 at 08:31AM.)  RBC (UA) 1 /HPF  WBC (UA) <1 /HPF  Bacteria (UA) NONE SEEN  Epithelial Cells (UA) NONE SEEN  Hyaline Cast (UA) 3 /LPF (Result(s) reported on 02 Jan 2013 at 08:31AM.)  Routine Coag:  17-Nov-14 07:53   Prothrombin 13.0  INR 1.0 (INR reference interval applies to patients on anticoagulant therapy. A single INR therapeutic range for coumarins is not optimal for all indications; however, the suggested range for most indications is 2.0 - 3.0. Exceptions to the INR Reference Range may include: Prosthetic heart valves, acute myocardial infarction, prevention of myocardial infarction, and combinations of aspirin and anticoagulant. The need for a higher or lower target INR must be assessed individually. Reference: The Pharmacology and Management of the Vitamin K  antagonists: the seventh ACCP  Conference on Antithrombotic and Thrombolytic Therapy. GXQJJ.9417 Sept:126 (3suppl): [id number]. A HCT value >55% may artifactually increase the PT.  In one study,  the increase was an average of 25%. Reference:  "Effect on Routine and Special Coagulation Testing Values of Citrate Anticoagulant Adjustment in Patients with High HCT Values." American Journal of Clinical Pathology 2006;126:400-405.)  Routine Hem:  17-Nov-14 07:53   WBC (CBC) 10.0  RBC (CBC) 4.20  Hemoglobin (CBC) 12.1  Hematocrit (CBC) 36.4  Platelet Count (CBC) 222 (Result(s) reported on 02 Jan 2013 at 08:30AM.)  MCV 87  MCH 28.8  MCHC 33.2  RDW 14.1   Radiology Results: [place]:    17-Nov-14 12:16, US Carotid Doppler Bilateral  US Carotid Doppler Bilateral   REASON FOR EXAM:    cva  COMMENTS:       PROCEDURE: [place]  - US CAROTID DOPPLER BILATERAL  - Jan 02 2013 12:16PM     CLINICAL DATA:  Stroke    EXAM:  BILATERAL CAROTID DUPLEX ULTRASOUND    TECHNIQUE:  Pearline Cables scale imaging, color Doppler and duplex ultrasound were  performed of bilateral carotid and vertebral arteries in the neck.    COMPARISON:  PET-CT, 01/02/2013  FINDINGS:  Criteria: Quantification of carotid stenosis is based on velocity  parameters that correlate the residual internal carotid diameter  with NASCET-based stenosis levels, using the diameter of the distal  internal carotid lumen as the denominator for stenosis measurement.    The following velocity measurements were obtained:    RIGHT    ICA:  163 cm/sec    CCA:  93 cm/sec    SYSTOLIC ICA/CCA RATIO:  4.08  DIASTOLIC ICA/CCA RATIO:  1.44    ECA:  146 cm/sec    LEFT    ICA:  203 cm/sec    CCA:  818 cm/sec    SYSTOLIC ICA/CCA RATIO:  5.63    DIASTOLIC ICA/CCA RATIO:  1.49    ECA:  115 cm/sec  RIGHT CAROTID ARTERY: Plaque extendsfrom the common carotid artery  into the carotid bulb and proximal internal carotid artery with  calcification and shadowing most evident in the  bulb.    RIGHT VERTEBRAL ARTERY:  Normal antegrade flow    LEFT CAROTID ARTERY: Plaque extends from the common carotid artery  across the carotid bulb into the external and internal carotid  arteries with associated calcification and shadowing. This is more  significant than seen on the right.    LEFT VERTEBRAL ARTERY:  Normal antegrade flow.     IMPRESSION:  1. Significant bilateral partly calcified atherosclerotic plaque  involving the common carotid arteries, carotid bifurcations and  internal carotid arteries.  2. On the right, There is a 50-69% stenosis of the proximal internal  carotid artery based on the peak systolic flow velocity of 163  cm/sec.  3. On the left, There is a 50-69% stenosis of the proximal internal  carotid artery again based on a peak systolic flow velocity that  measures 2 and 3 cm/sec.  4. Plaque is greater in the left carotid bulb and proximal internal  carotid artery than on the right. This correlates with the velocity  measurements.  5. Antegrade flow in the vertebral arteries.      Electronically Signed    By: Lajean Manes M.D.    On: 01/02/2013 12:27         Verified By: Lasandra Beech, M.D.,  MRI:    72-BMS-11 14:27, MRI Brain Without Contrast  MRI Brain Without Contrast   REASON FOR EXAM:    CVA  COMMENTS:       PROCEDURE: MR  - MR BRAIN WO CONTRAST  - Jan 02 2013  2:27PM     CLINICAL DATA:  Left-sided weakness.  Blurred vision.  Stroke.    EXAM:  MRI HEAD WITHOUT CONTRAST    TECHNIQUE:  Multiplanar, multiecho pulse sequences of the brain and surrounding  structures were obtained without intravenous contrast.    COMPARISON:  CT 01/02/2013  FINDINGS:  Acute infarct left posterior cerebral artery territory. Acute  infarct in the left occipital lobe extending into the hippocampus  and left thalamus. No acute infarct in the cerebellum.    Chronic infarct left frontal lobe as noted on prior studies.    Mild atrophy. Ventricle  size is normal. Negative for intracranial  hemorrhage or mass lesion.     IMPRESSION:  Acute infarct left posterior cerebral artery territory    Chronic left frontal infarct.  Electronically Signed    By: Franchot Gallo M.D.    On: 01/02/2013 14:35         Verified By: Truett Perna, M.D.,  CT:    17-Nov-14 08:51, CT Head Without Contrast  CT Head Without Contrast   REASON FOR EXAM:    confusion  COMMENTS:       PROCEDURE: CT  - CT HEAD WITHOUT CONTRAST  - Jan 02 2013  8:51AM     CLINICAL DATA:  Confusion    EXAM:  CT HEAD WITHOUT CONTRAST    TECHNIQUE:  Contiguous axial images were obtained from the base of theskull  through the vertex without intravenous contrast.    COMPARISON:  Brain MRI October 22, 2006 and brain CT October 21, 2006    FINDINGS:  The ventricles are normal in size and configuration. There is no  mass, hemorrhage, extra-axial fluid collection, or midline shift.  There is evidence of a prior infarct in the mid left frontal lobe,  stable. There is mild small vessel disease in the centra semiovale  bilaterally. There is no new gray-white compartment lesion. There is  no demonstrable acute infarct on this study.    Bony calvarium appears intact.  The mastoid air cells are clear.     IMPRESSION:  Prior left frontal lobe infarct. There is mild small vessel disease.  There is no demonstrable mass, hemorrhage, or acute appearing  infarct.      Electronically Signed    By: Lowella Grip M.D.    On: 01/02/2013 08:54         Verified By: Leafy Kindle. WOODRUFF, M.D.,   Impression/Recommendations: Recommendations:   81 year old woman presented with difficulty getting her words out along with some confusion, found to have acute stroke on brain MRI. with afib raising risk for stroke.  Also with greater than 50% carotid artery stenosis bilaterally also raising risk for stroke.  Brain MRI personally viewed, shows left PCA distribution stroke.   This is posterior circulation making carotid artery stenosis less likely cause and afib more likely cause for the stroke.  Patient is going for CTA to assess degree of cerebrovascular blockage in more detail.  Discussed with son and patent that generally for posterior circulation stenosis, medication is preferred over surgery.  Would recommend increasing her ASA 325 mg daily to Aggrenox instead.  Needs TTE to rule out intracardiac thrombus.  Speech deficit appears resolved.  Very mild confusion on exam. STROKEContinue frequent neuro checks (Q4h) for first 24 hours and reassess frequency after that time.Brain MRI shows CVA as outlined aboveCarotid doppler - greater than 50% carotid stenosisTTE with bubble study to evaluate for intracardiac shunts or cardiac thrombiPatient with known afibCheck fasting lipid panel, TSH and HgbA1cCycle cardiac enzymes Cardiac monitoring as described above.Antiplatelet therapy: change ASA to aggrenox TREATMENT:Per ATP III guidlines, given that patient has additional stroke risk factors, start or increase statin if LDL > 70 PRESSURE CONTROL:Allow permissive HTN for BP less than 220/110Restart home antihypertensive medications prior to discharge. PT, OT, Speech evaluationsConsult speech therapy for swallowing assessment and stroke education.Fall PrecautionsCounseling against tobacco useDVT prophylaxis with [place] lovenox.  have reviewed the results of the most recent imaging studies, tests and labs as outlined above and answered all related questions.  I have personally viewed the patient's Brain MRI and it shows acute stroke in the left PCA distribution.       Electronic Signatures: Anabel Bene (MD)  (Signed [phone] 05:33)  Authored: REFERRING PHYSICIAN, Primary Care Physician, Consult, History of Present Illness, Review of Systems, PAST MEDICAL/SURGICAL HISTORY, HOME MEDICATIONS, Current Medications, ALLERGIES, NURSING VITAL SIGNS, Physical Exam-, LAB RESULTS, RADIOLOGY  RESULTS, Recommendations   Last Updated: 18-Nov-14 05:33 by Anabel Bene (MD)

## 2014-06-08 NOTE — Discharge Summary (Signed)
PATIENT NAME:  [name], [name] MR#:  [id number] DATE OF BIRTH:  1922/10/07  DATE OF ADMISSION:  01/03/2013 DATE OF DISCHARGE:  01/04/2013  ADMITTING PHYSICIAN: Loletha Grayer, MD  DISCHARGING PHYSICIAN: Gladstone Lighter, MD   PRIMARY CARE PHYSICIAN: Einar Pheasant, MD   [place]: Neurology consultation by Dr. Gurney Maxin.   DISCHARGE DIAGNOSES:  1.  Acute cerebrovascular accident with left occipital and hippocampal infarcts.  2.  Chronic atrial fibrillation.  3.  Hypertension.  4.  Hyperlipidemia.     DISCHARGE HOME MEDICATIONS:  1.  Gabapentin 100 mg p.o. at bedtime.  2.  Multivitamin 1 tablet p.o. daily.  3.  Eliquis 5 mg p.o. b.i.d.  4.  Atorvastatin 20 mg p.o. at bedtime.  5.  Metoprolol 25 mg p.o. b.i.d.  6.  Norvasc 2.5 mg p.o. daily.   DISCHARGE DIET: Low-sodium diet.   DISCHARGE ACTIVITY: As tolerated.    FOLLOWUP INSTRUCTIONS:  1.  Cardiology follow-up with Dr. Rockey Situ in 1 to 2 weeks.  2.  PCP follow-up in 4 weeks.  3.  Neurology follow-up in 3 to 4 weeks.  LABORATORY AND IMAGING STUDIES PRIOR TO DISCHARGE: Sodium 136, potassium 3.6, chloride 102, bicarb 28, BUN 12, creatinine 0.62, glucose 97 and calcium of 8.8.   WBC 9.0, hemoglobin 11.9, hematocrit 34.7, platelet count 236.   LDL 75, HDL 94, triglycerides 66, and total cholesterol 182.   Echo Doppler showing LV ejection fraction is 45% to 50%, decreased global left ventricular systolic function. Mild to moderate aortic valve stenosis is seen.   HbA1c 6.0. TSH within normal limits at 4.25.   CT angiogram of the carotids showing widespread atherosclerosis, high-grade proximal ICA   stenosis due to bulky calcified plaque. Hemodynamically significant proximal left ICA stenosis up to 60% to 70% on left vertebral arteries without any hemodynamically significant stenosis. Nondominant right vertebral artery with moderate to severe stenosis of right V4 segment at the skull base. Trace left  pleural effusion and apical lung findings suggesting mild interstitial edema noted.   MRI of the brain without contrast showing acute infarct, left posterior cerebral artery territory, with infarct noted in the left occipital lobe extending into the hippocampus and left thalamus.  Ultrasound carotid Dopplers showing 50% to 70% stenosis bilaterally.   CT of the head initially on admission showing prior frontal lobe infarct on the left side. No other acute changes seen.   Urinalysis negative for any infection.   BRIEF HOSPITAL COURSE: [name] is an 81 year old pleasant Caucasian female with past medical history significant for chronic Afib,  not on anticoagulation currently who lives at home by herself and who was brought by family secondary to slurred speech and confusion.  1.  Acute CVA. Initial CT head was negative. However, the patient had slurred speech with word-finding difficult and also right homonymous hemianopsia. MRI of the brain was done and it showed she has an acute infarct in the left occipital lobe extending into the left hippocampus and also thalamus. Her overall strength has improved significantly. The patient has worked with physical therapy who recommended rehab; however, the patient has improved and she has good family support at home, so they wanted to take her home and the patient wanted to go home as well, so she is being discharged home with home health field. Neurology has seen the patient while in the hospital. She was already on aspirin as an outpatient. Her risk factor was AFib and she was not on anticoagulation with Coumadin  which was stopped as an outpatient secondary to the patient's inconvenience from frequent INR checks. She does have significant carotid artery stenosis bilaterally, high grade on the right side, both with bulky plaque; however, her current infarct is in the posterior circulation on the left side which does not correlate with her stenosis, so likely  source is embolic at this time. Cardiology was curbside consulted, and the patient is being discharged on anticoagulation with Eliquis. No further antiplatelet drug is being started because of her risk of bleeding due to her age. The patient was also started on statin which she was not taking prior to hospitalization.  2.  Hypertension. The patient was started on metoprolol along with her home dose Norvasc for her blood pressure.  3.  Chronic Afib, was not on anticoagulation; now, with acute stroke, she was started on Eliquis.  Rate was controlled with metoprolol, and she will [facility] up with [facility] cardiology as an outpatient. Her EF on echo is 50%.   Her course has been otherwise uneventful in the hospital.   DISCHARGE CONDITION: Stable.   DISCHARGE DISPOSITION: Home with home health.   TIME SPENT ON DISCHARGE: 45 minutes.    ____________________________ Gladstone Lighter, MD rk:np D: 01/04/2013 14:23:32 ET T: 01/04/2013 15:59:00 ET JOB#: [id number]  cc: Gladstone Lighter, MD, <Dictator> Einar Pheasant, MD Minna Merritts, MD  Gladstone Lighter MD ELECTRONICALLY SIGNED 01/10/2013 18:18

## 2014-06-10 NOTE — Op Note (Signed)
PATIENT NAME:  [name], [name] MR#:  [id number] DATE OF BIRTH:  [birth year]  DATE OF PROCEDURE:  07/07/2011  PREOPERATIVE DIAGNOSIS: Atherosclerotic occlusive disease bilateral lower extremities with ulceration of the right foot.   POSTOPERATIVE DIAGNOSIS: Atherosclerotic occlusive disease bilateral lower extremities with ulceration of the right foot.    PROCEDURES PERFORMED:  1. Abdominal aortogram.  2. Right lower extremity distal runoff third order catheter placement.  3. Percutaneous crosser atherectomy of the right SFA.  4. Percutaneous transluminal angioplasty and stent placement right SFA.   PROCEDURE PERFORMED BY: Katha Cabal, M.D.   SEDATION: Versed 4 mg plus fentanyl 200 mcg administered IV. Continuous ECG, pulse oximetry, and cardiopulmonary monitoring is performed throughout the entire procedure by the interventional radiology nurse. Total sedation time is 1 hour, 50 minutes.   ACCESS: 7 French sheath left common femoral artery.   CONTRAST USED: Isovue 95 mL.   FLUORO TIME: 16 minutes.   INDICATIONS: [name] is an 81 year old woman who presents with ulcerations of her foot on the right and profound atherosclerotic occlusive disease. The risks and benefits for angiography and intervention were reviewed. Limb salvage secondary to her ischemic ulcerations was discussed. Bypass surgery and other alternatives were also reviewed. All questions have been answered. The patient wishes to proceed with minimally invasive angiography and intervention.   PROCEDURE: The patient is taken to the Special Procedures Suite and placed in the supine position. After adequate sedation is achieved, the patient is positioned supine. Left groin is prepped and draped in a sterile fashion. 1% lidocaine is infiltrated in the soft tissues and access to the common femoral artery is obtained with a micropuncture needle, microwire followed micro sheath, J-wire followed by 5 French sheath and 5 French  pigtail catheter. Pigtail catheter is positioned at the level of T12 and bolus injection of contrast is utilized to demonstrate the abdominal aorta. Pigtail catheter is repositioned to above the bifurcation and LAO projection is obtained. Stiff angled Glidewire and pigtail catheter are then used to cross the bifurcation. The catheter is advanced down into the superficial femoral artery. Wire is then negotiated into the superficial femoral artery down to the midlevel where the occlusion is. 5000 units of heparin is given and the pigtail catheter and 5 French sheath are removed and a 7 [place] Ansel sheath is inserted. The 14-S crosser device is then prepared on the field. A straight microcatheter is advanced over the wire and the angled microcatheter is positioned at the occlusion. The S-14 is then advanced through the microcatheter and a 0.014 grand slam wire is advanced into but not extending beyond the 14-S. 14-S and catheter system are then used to negotiate the occlusion at the level of the femoral condyles the. Wire is advanced. The crosser is then removed and injection of contrast demonstrates intraluminal placement. The microcatheter is then removed and a 3 x 20 Ultraverse balloon is advanced over the wire. Two serial inflations are created. A 5 x 20 Ultraverse balloon is then advanced over the wire and beginning at the level of the femoral condyles angioplasty is performed. Follow-up angiography demonstrates flow-limiting dissections throughout the occluded segment and, therefore, two life stents, a 6 x 20 and subsequently an additional 6 x 120 stent are deployed. There are postdilated to 5. Follow-up angiography now demonstrates more complete filling of the tibial vessels and occlusion of the anterior tibial and posterior tibial are noted. Peroneal demonstrates a focal greater than 70% stenosis less than 2 cm in length in  its proximal one-third. Catheter and wire are negotiated across this and a 3 x 4  balloon is used to serial angioplasty this lesion. Follow-up angiography demonstrates an excellent result with less than 10% residual stenosis.   Oblique view of the right groin is obtained which demonstrates the origin to be free of dissection and widely patent.   The sheath is then pulled back into the left external iliac, the 0.014 wire is exchanged for a J-wire, and oblique view of the groin obtained. Mynx device is then deployed and pressure is held. There are no immediate complications.   INTERPRETATION: Initial views demonstrate the aorta is moderately tortuous. No flow-limiting stenoses are noted. Bilateral nephrograms are noted. Single renal arteries, moderate renal artery stenosis is identified greater on the right compared to the left. Iliac arteries are patent and free of hemodynamically significant stenoses.   The right common femoral and profunda femoris are patent. Superficial femoral artery occludes in its midportion and remains occluded down to the level of the femoral condyles, that is the mid popliteal. There is one isolated segment of SFA which is reconstituted just at Hunter's canal. Below the mid popliteal level, the distal popliteal is patent and fills peroneal which is the dominant runoff to the foot. Anterior tibial and posterior tibial are now visualized. Peroneal is demonstrated to have a high-grade stenosis in its proximal one-third later in the case with improved flow and better views. Following crosser atherectomy and subsequently angioplasty, there are flow-limiting dissections and, therefore, life stents are deployed as described. Angioplasty is then performed of the tibial lesion with an excellent result.   SUMMARY: Successful recanalization with in-line flow to the foot via SFA and single vessel runoff via the peroneal as described.   ____________________________ Katha Cabal, MD ggs:drc D: 07/07/2011 19:44:07 ET T: 07/08/2011 09:28:14  ET JOB#: [id number]  cc: Katha Cabal, MD, <Dictator> Einar Pheasant, MD Katha Cabal MD ELECTRONICALLY SIGNED 07/10/2011 10:56

## 2014-06-29 ENCOUNTER — Encounter: Payer: Self-pay | Admitting: Internal Medicine

## 2014-06-29 ENCOUNTER — Ambulatory Visit (INDEPENDENT_AMBULATORY_CARE_PROVIDER_SITE_OTHER): Payer: Medicare Other | Admitting: Internal Medicine

## 2014-06-29 VITALS — BP 152/72 | HR 97 | Temp 97.9°F | Ht 65.0 in | Wt 143.0 lb

## 2014-06-29 DIAGNOSIS — E7800 Pure hypercholesterolemia, unspecified: Secondary | ICD-10-CM

## 2014-06-29 DIAGNOSIS — I2510 Atherosclerotic heart disease of native coronary artery without angina pectoris: Secondary | ICD-10-CM

## 2014-06-29 DIAGNOSIS — I482 Chronic atrial fibrillation, unspecified: Secondary | ICD-10-CM

## 2014-06-29 DIAGNOSIS — R946 Abnormal results of thyroid function studies: Secondary | ICD-10-CM | POA: Diagnosis not present

## 2014-06-29 DIAGNOSIS — D649 Anemia, unspecified: Secondary | ICD-10-CM

## 2014-06-29 DIAGNOSIS — I10 Essential (primary) hypertension: Secondary | ICD-10-CM | POA: Diagnosis not present

## 2014-06-29 DIAGNOSIS — R609 Edema, unspecified: Secondary | ICD-10-CM | POA: Diagnosis not present

## 2014-06-29 DIAGNOSIS — I639 Cerebral infarction, unspecified: Secondary | ICD-10-CM | POA: Diagnosis not present

## 2014-06-29 DIAGNOSIS — E1159 Type 2 diabetes mellitus with other circulatory complications: Secondary | ICD-10-CM

## 2014-06-29 DIAGNOSIS — R21 Rash and other nonspecific skin eruption: Secondary | ICD-10-CM

## 2014-06-29 DIAGNOSIS — I739 Peripheral vascular disease, unspecified: Secondary | ICD-10-CM

## 2014-06-29 DIAGNOSIS — R7989 Other specified abnormal findings of blood chemistry: Secondary | ICD-10-CM

## 2014-06-29 DIAGNOSIS — E871 Hypo-osmolality and hyponatremia: Secondary | ICD-10-CM | POA: Diagnosis not present

## 2014-06-29 DIAGNOSIS — G629 Polyneuropathy, unspecified: Secondary | ICD-10-CM

## 2014-06-29 LAB — BASIC METABOLIC PANEL
BUN: 22 mg/dL (ref 6–23)
CO2: 33 mEq/L — ABNORMAL HIGH (ref 19–32)
CREATININE: 0.73 mg/dL (ref 0.40–1.20)
Calcium: 10.5 mg/dL (ref 8.4–10.5)
Chloride: 91 mEq/L — ABNORMAL LOW (ref 96–112)
GFR: 79.59 mL/min (ref >60.00)
Glucose, Bld: 140 mg/dL — ABNORMAL HIGH (ref 70–99)
Potassium: 4.4 mEq/L (ref 3.5–5.1)
Sodium: 131 mEq/L — ABNORMAL LOW (ref 135–145)

## 2014-06-29 LAB — CBC WITH DIFFERENTIAL/PLATELET
Basophils Absolute: 0 10*3/uL (ref 0.0–0.1)
Basophils Relative: 0.4 % (ref 0.0–3.0)
Eosinophils Absolute: 0.1 10*3/uL (ref 0.0–0.7)
Eosinophils Relative: 0.6 % (ref 0.0–5.0)
HCT: 36.8 % (ref 36.0–46.0)
Hemoglobin: 13 g/dL (ref 12.0–15.0)
LYMPHS ABS: 1.4 10*3/uL (ref 0.7–4.0)
Lymphocytes Relative: 13.5 % (ref 12.0–46.0)
MCHC: 35.3 g/dL (ref 30.0–36.0)
MCV: 87.5 fl (ref 78.0–100.0)
Monocytes Absolute: 0.9 10*3/uL (ref 0.1–1.0)
Monocytes Relative: 8.2 % (ref 3.0–12.0)
NEUTROS PCT: 77.3 % — AB (ref 43.0–77.0)
Neutro Abs: 8.2 10*3/uL — ABNORMAL HIGH (ref 1.4–7.7)
Platelets: 320 10*3/uL (ref 150.0–400.0)
RBC: 4.2 Mil/uL (ref 3.87–5.11)
RDW: 14.1 % (ref 11.5–15.5)
WBC: 10.7 10*3/uL — ABNORMAL HIGH (ref 4.0–10.5)

## 2014-06-29 LAB — LIPID PANEL
CHOLESTEROL: 199 mg/dL (ref 0–200)
HDL: 95.7 mg/dL (ref >39.00)
LDL Cholesterol: 88 mg/dL (ref 0–99)
NONHDL: 103.3
Total CHOL/HDL Ratio: 2
Triglycerides: 78 mg/dL (ref 0.0–149.0)
VLDL: 15.6 mg/dL (ref 0.0–40.0)

## 2014-06-29 LAB — FERRITIN: Ferritin: 83.3 ng/mL (ref 10.0–291.0)

## 2014-06-29 LAB — HEPATIC FUNCTION PANEL
ALT: 17 U/L (ref 0–35)
AST: 21 U/L (ref 0–37)
Albumin: 4.4 g/dL (ref 3.5–5.2)
Alkaline Phosphatase: 121 U/L — ABNORMAL HIGH (ref 39–117)
BILIRUBIN DIRECT: 0.3 mg/dL (ref 0.0–0.3)
BILIRUBIN TOTAL: 0.8 mg/dL (ref 0.2–1.2)
Total Protein: 7.6 g/dL (ref 6.0–8.3)

## 2014-06-29 LAB — HEMOGLOBIN A1C: Hgb A1c MFr Bld: 6.6 % — ABNORMAL HIGH (ref 4.6–6.5)

## 2014-06-29 LAB — TSH: TSH: 2.59 u[IU]/mL (ref 0.35–4.50)

## 2014-06-29 MED ORDER — METOPROLOL TARTRATE 25 MG PO TABS: 25.0000 mg | ORAL_TABLET | Freq: Two times a day (BID) | ORAL | Status: DC

## 2014-06-29 MED ORDER — HYDROCHLOROTHIAZIDE 25 MG PO TABS: 25.0000 mg | ORAL_TABLET | Freq: Every day | ORAL | Status: DC

## 2014-06-29 MED ORDER — LISINOPRIL 40 MG PO TABS: 40.0000 mg | ORAL_TABLET | ORAL | Status: DC

## 2014-06-29 MED ORDER — NYSTATIN 100000 UNIT/GM EX CREA: 1.0000 "application " | TOPICAL_CREAM | Freq: Two times a day (BID) | CUTANEOUS | Status: DC

## 2014-06-29 NOTE — Progress Notes (Signed)
Patient ID: [name], female   DOB: March 17, 1922, 81 y.o.   MRN: [id number]   Subjective:    Patient ID: [name], female    DOB: [birth year], 81 y.o.   MRN: [id number]  HPI  Patient here to follow up on her current medical issues as well as for a physical.  She is accompanied by her daughter.  History obtained from both of them.  Taking her medication regularly.  Off zoloft.  Was concerned made her too sleepy.  On questioning, she goes to bed late and sleeps well when she sleeps.  She sets her alarm to wake up early.  We discussed sleeping later.  No increased heart rate or palpitations.  Breathing stable.  Eating and drinking well.  Reports rash beneath her breast and in inguinal area.  Blood pressures are doing better.  Blood pressures averaging 140-160/60-80.  Swelling better.     Past Medical History  Diagnosis Date  . Type II or unspecified type diabetes mellitus without mention of complication, not stated as uncontrolled   . Essential hypertension, benign   . Anemia, unspecified   . Atrial fibrillation   . Hypercholesterolemia   . Osteoarthritis   . History of TIAs   . CVA (cerebral infarction)   . Atrial fibrillation   . Uterine cancer     s/p TAH/BSO  . CAD (coronary artery disease)     s/p CABG 10/99 with LIMA to the LAD, SVG to OM1, SVG to distal RCA    Current Outpatient Prescriptions on File Prior to Visit  Medication Sig Dispense Refill  . apixaban (ELIQUIS) 2.5 MG TABS tablet TAKE 1 TABLET BY MOUTH TWICE A DAY 60 tablet 5  . clotrimazole-betamethasone (LOTRISONE) cream Apply 1 application topically 2 (two) times daily. 30 g 0  . furosemide (LASIX) 20 MG tablet TAKE 1 TABLET (20 MG TOTAL) BY MOUTH DAILY. 30 tablet 5  . glucose blood test strip 1 each by Other route as needed for other. Use one test strip bid    . KLOR-CON M10 10 MEQ tablet TAKE 1 TABLET (10 MEQ TOTAL) BY MOUTH DAILY. 30 tablet 5  . senna-docusate (SENOKOT-S) 8.6-50 MG per tablet Take 1 tablet  by mouth 2 (two) times daily. 60 tablet 3  . sertraline (ZOLOFT) 50 MG tablet Take 1 tablet (50 mg total) by mouth daily. 90 tablet 0  . traMADol (ULTRAM) 50 MG tablet Take 1 tablet (50 mg total) by mouth 2 (two) times daily as needed. 40 tablet 0   No current facility-administered medications on file prior to visit.    Review of Systems  Constitutional: Negative for appetite change and unexpected weight change.  HENT: Negative for congestion and sinus pressure.   Eyes: Negative for pain and visual disturbance.  Respiratory: Negative for cough, chest tightness and shortness of breath.   Cardiovascular: Positive for leg swelling (leg swelling is better.  ). Negative for chest pain and palpitations.  Gastrointestinal: Negative for nausea, vomiting, abdominal pain and diarrhea.  Genitourinary: Negative for dysuria and difficulty urinating.  Musculoskeletal: Negative for back pain and joint swelling.  Skin: Negative for color change and rash.  Neurological: Negative for dizziness, light-headedness and headaches.  Hematological: Negative for adenopathy. Does not bruise/bleed easily.  Psychiatric/Behavioral: Negative for dysphoric mood and agitation.       Objective:     Blood pressure recheck:  152/72  Physical Exam  Constitutional: She is oriented to person, place, and time. She appears well-developed and  well-nourished.  HENT:  Nose: Nose normal.  Mouth/Throat: Oropharynx is clear and moist.  Eyes: Right eye exhibits no discharge. Left eye exhibits no discharge. No scleral icterus.  Neck: Neck supple. No thyromegaly present.  Cardiovascular: Normal rate and regular rhythm.   Pulmonary/Chest: Breath sounds normal. No accessory muscle usage. No tachypnea. No respiratory distress. She has no decreased breath sounds. She has no wheezes. She has no rhonchi. Right breast exhibits no inverted nipple, no mass, no nipple discharge and no tenderness (no axillary adenopathy). Left breast  exhibits no inverted nipple, no mass, no nipple discharge and no tenderness (no axilarry adenopathy).  Abdominal: Soft. Bowel sounds are normal. There is no tenderness.  Musculoskeletal: She exhibits no edema or tenderness.  Lymphadenopathy:    She has no cervical adenopathy.  Neurological: She is alert and oriented to person, place, and time.  Skin: Skin is warm. No rash noted.  Psychiatric: She has a normal mood and affect. Her behavior is normal.    BP 152/72 mmHg  Pulse 97  Temp(Src) 97.9 F (36.6 C) (Oral)  Ht 5\' 5"  (1.651 m)  Wt 143 lb (64.864 kg)  BMI 23.80 kg/m2  SpO2 98% Wt Readings from Last 3 Encounters:  06/29/14 143 lb (64.864 kg)  11/09/13 138 lb (62.596 kg)  10/12/13 139 lb (63.05 kg)     Lab Results  Component Value Date   WBC 10.7* 06/29/2014   HGB 13.0 06/29/2014   HCT 36.8 06/29/2014   PLT 320.0 06/29/2014   GLUCOSE 140* 06/29/2014   CHOL 199 06/29/2014   TRIG 78.0 06/29/2014   HDL 95.70 06/29/2014   LDLDIRECT 85.3 03/31/2013   LDLCALC 88 06/29/2014   ALT 17 06/29/2014   AST 21 06/29/2014   NA 131* 06/29/2014   K 4.4 06/29/2014   CL 91* 06/29/2014   CREATININE 0.73 06/29/2014   BUN 22 06/29/2014   CO2 33* 06/29/2014   TSH 2.59 06/29/2014   INR 1.12 01/26/2013   HGBA1C 6.6* 06/29/2014   MICROALBUR 6.5* 03/31/2013       Assessment & Plan:   Problem List Items Addressed This Visit    Atrial fibrillation - Primary    She is on eliquis.  Rate controlled.        Relevant Medications   lisinopril (PRINIVIL,ZESTRIL) 40 MG tablet   metoprolol tartrate (LOPRESSOR) 25 MG tablet   hydrochlorothiazide (HYDRODIURIL) 25 MG tablet   CAD (coronary artery disease)    Has known disease.  Sees cardiology.  Stable.       Relevant Medications   lisinopril (PRINIVIL,ZESTRIL) 40 MG tablet   metoprolol tartrate (LOPRESSOR) 25 MG tablet   hydrochlorothiazide (HYDRODIURIL) 25 MG tablet   CVA (cerebral vascular accident)    S/p CVA.  On eliquis.  No  reoccurring symptoms.       Relevant Medications   lisinopril (PRINIVIL,ZESTRIL) 40 MG tablet   metoprolol tartrate (LOPRESSOR) 25 MG tablet   hydrochlorothiazide (HYDRODIURIL) 25 MG tablet   Diabetes    Low carb diet and exercise.  Follow metabolic panel and P9J.       Relevant Medications   lisinopril (PRINIVIL,ZESTRIL) 40 MG tablet   Other Relevant Orders   Basic metabolic panel   Hemoglobin A1c (Completed)   Edema    Swelling is better.  Compression hose.  On lasix and hctz.  Check metabolic panel.  May be able to discontinue one of these.        Essential hypertension, benign    Blood  pressure overall better.  Follow pressures.  Follow metabolic panel.       Relevant Medications   lisinopril (PRINIVIL,ZESTRIL) 40 MG tablet   metoprolol tartrate (LOPRESSOR) 25 MG tablet   hydrochlorothiazide (HYDRODIURIL) 25 MG tablet   Hypercholesterolemia    Low cholesterol diet and exercise.  Follow lipid panel and liver function tests.       Relevant Medications   lisinopril (PRINIVIL,ZESTRIL) 40 MG tablet   metoprolol tartrate (LOPRESSOR) 25 MG tablet   hydrochlorothiazide (HYDRODIURIL) 25 MG tablet   Other Relevant Orders   Lipid panel (Completed)   Hepatic function panel (Completed)   Hyponatremia    Recheck sodium today to confirm stable.       Neuropathy    Continue tylenol.        Peripheral vascular disease    S/p stent placement.  Followed by vascular surgery.  On eliquis.        Relevant Medications   lisinopril (PRINIVIL,ZESTRIL) 40 MG tablet   metoprolol tartrate (LOPRESSOR) 25 MG tablet   hydrochlorothiazide (HYDRODIURIL) 25 MG tablet   Rash    Appears to be c/w yeast.  Nystatin cream as directed.  Follow.        Other Visit Diagnoses    Anemia, unspecified anemia type        Relevant Orders    CBC with Differential/Platelet (Completed)    Ferritin (Completed)    Elevated TSH        Relevant Orders    TSH (Completed)      I spent 25 minutes  with the patient and more than 50% of the time was spent in consultation regarding the above.     Einar Pheasant, MD

## 2014-06-29 NOTE — Progress Notes (Signed)
Pre visit review using our clinic review tool, if applicable. No additional management support is needed unless otherwise documented below in the visit note. 

## 2014-06-30 ENCOUNTER — Other Ambulatory Visit: Payer: Self-pay | Admitting: Internal Medicine

## 2014-06-30 DIAGNOSIS — R748 Abnormal levels of other serum enzymes: Secondary | ICD-10-CM

## 2014-06-30 DIAGNOSIS — E871 Hypo-osmolality and hyponatremia: Secondary | ICD-10-CM

## 2014-06-30 NOTE — Progress Notes (Signed)
Order placed for f/u labs.  

## 2014-07-02 ENCOUNTER — Encounter: Payer: Self-pay | Admitting: Internal Medicine

## 2014-07-02 DIAGNOSIS — R21 Rash and other nonspecific skin eruption: Secondary | ICD-10-CM | POA: Insufficient documentation

## 2014-07-02 NOTE — Assessment & Plan Note (Signed)
She is on eliquis.  Rate controlled.

## 2014-07-02 NOTE — Assessment & Plan Note (Signed)
Swelling is better.  Compression hose.  On lasix and hctz.  Check metabolic panel.  May be able to discontinue one of these.

## 2014-07-02 NOTE — Assessment & Plan Note (Signed)
Blood pressure overall better.  Follow pressures.  Follow metabolic panel.

## 2014-07-02 NOTE — Assessment & Plan Note (Signed)
Appears to be c/w yeast.  Nystatin cream as directed.  Follow.

## 2014-07-02 NOTE — Assessment & Plan Note (Signed)
Has known disease.  Sees cardiology.  Stable.

## 2014-07-02 NOTE — Assessment & Plan Note (Signed)
Continue tylenol

## 2014-07-02 NOTE — Assessment & Plan Note (Signed)
Low carb diet and exercise.  Follow metabolic panel and a1c.   

## 2014-07-02 NOTE — Assessment & Plan Note (Signed)
Recheck sodium today to confirm stable.

## 2014-07-02 NOTE — Assessment & Plan Note (Signed)
S/p stent placement.  Followed by vascular surgery.  On eliquis.

## 2014-07-02 NOTE — Assessment & Plan Note (Signed)
Low cholesterol diet and exercise.  Follow lipid panel and liver function tests.  

## 2014-07-02 NOTE — Assessment & Plan Note (Signed)
S/p CVA.  On eliquis.  No reoccurring symptoms.

## 2014-07-18 ENCOUNTER — Other Ambulatory Visit (INDEPENDENT_AMBULATORY_CARE_PROVIDER_SITE_OTHER): Payer: Medicare Other

## 2014-07-18 ENCOUNTER — Encounter: Payer: Self-pay | Admitting: *Deleted

## 2014-07-18 DIAGNOSIS — R748 Abnormal levels of other serum enzymes: Secondary | ICD-10-CM

## 2014-07-18 DIAGNOSIS — E871 Hypo-osmolality and hyponatremia: Secondary | ICD-10-CM

## 2014-07-18 LAB — BASIC METABOLIC PANEL
BUN: 15 mg/dL (ref 6–23)
CALCIUM: 9.9 mg/dL (ref 8.4–10.5)
CO2: 31 meq/L (ref 19–32)
CREATININE: 0.66 mg/dL (ref 0.40–1.20)
Chloride: 95 mEq/L — ABNORMAL LOW (ref 96–112)
GFR: 89.4 mL/min (ref >60.00)
Glucose, Bld: 134 mg/dL — ABNORMAL HIGH (ref 70–99)
POTASSIUM: 5.1 meq/L (ref 3.5–5.1)
SODIUM: 132 meq/L — AB (ref 135–145)

## 2014-07-18 LAB — ALKALINE PHOSPHATASE: ALK PHOS: 219 U/L — AB (ref 39–117)

## 2014-07-19 ENCOUNTER — Other Ambulatory Visit: Payer: Self-pay | Admitting: Internal Medicine

## 2014-07-19 DIAGNOSIS — E871 Hypo-osmolality and hyponatremia: Secondary | ICD-10-CM

## 2014-07-19 DIAGNOSIS — R748 Abnormal levels of other serum enzymes: Secondary | ICD-10-CM

## 2014-07-19 NOTE — Progress Notes (Signed)
Order placed for f/u labs.  

## 2014-07-20 ENCOUNTER — Telehealth: Payer: Self-pay | Admitting: Internal Medicine

## 2014-07-20 ENCOUNTER — Telehealth: Payer: Self-pay

## 2014-07-20 NOTE — Telephone Encounter (Signed)
Patient called to confirm that her Blood Pressure readings are within normal for her.  I see that a letter was sent on 6/1?  I am assuming she hasn't gotten it yet.  She said her Blood pressure has been running high in the afternoon still.  Her numbers aren't correlating though, ("80/90's).  Please advise if you want any changes?

## 2014-07-20 NOTE — Telephone Encounter (Signed)
Pt request a called to update her a BP readings to see if it is okay.msn

## 2014-07-20 NOTE — Telephone Encounter (Signed)
Spoke with Daughter, [name], she was returning [place], [place] 's call from yesterday.  Notifed her mother's sodium level is stable, Still slightly decreased but stable. We will follow. Kidney function tests are Within normal limits. Alkaline phosphatase elevated. We will need to recheck this as well. Schedule non fasting lab in 7-10 days.  She will call back with a follow up appointment time.

## 2014-07-20 NOTE — Telephone Encounter (Signed)
Spoke with daughter and scheduled lab appointment for 07/26/14 at 845am.

## 2014-07-20 NOTE — Telephone Encounter (Signed)
I did review her blood pressures.  There was variation with some elevation.  Overall has been better.  I would recommend no change at this time.  Continue to monitor but do not check too close together.  This will run the blood pressure higher and makes her anxious.  Can send in readings in 2-3 weeks.

## 2014-07-20 NOTE — Telephone Encounter (Signed)
Daughter notified 

## 2014-07-26 ENCOUNTER — Other Ambulatory Visit (INDEPENDENT_AMBULATORY_CARE_PROVIDER_SITE_OTHER): Payer: Medicare Other

## 2014-07-26 ENCOUNTER — Telehealth: Payer: Self-pay | Admitting: Internal Medicine

## 2014-07-26 DIAGNOSIS — R748 Abnormal levels of other serum enzymes: Secondary | ICD-10-CM

## 2014-07-26 DIAGNOSIS — E871 Hypo-osmolality and hyponatremia: Secondary | ICD-10-CM | POA: Diagnosis not present

## 2014-07-26 LAB — HEPATIC FUNCTION PANEL
ALT: 31 U/L (ref 0–35)
AST: 22 U/L (ref 0–37)
Albumin: 4.4 g/dL (ref 3.5–5.2)
Alkaline Phosphatase: 168 U/L — ABNORMAL HIGH (ref 39–117)
BILIRUBIN TOTAL: 0.9 mg/dL (ref 0.2–1.2)
Bilirubin, Direct: 0.4 mg/dL — ABNORMAL HIGH (ref 0.0–0.3)
Total Protein: 7 g/dL (ref 6.0–8.3)

## 2014-07-26 LAB — SODIUM: Sodium: 133 mEq/L — ABNORMAL LOW (ref 135–145)

## 2014-07-26 NOTE — Telephone Encounter (Signed)
Placed in yellow folder.  

## 2014-07-26 NOTE — Telephone Encounter (Signed)
Ms. [name] notified & states that it is the Furosemide that she feels is making her drowsy.

## 2014-07-26 NOTE — Telephone Encounter (Signed)
I reviewed the blood pressures.  The blood pressure is occasionally running a little higher, but appear to come down.  We will continue to follow.  She put a message on the form that stated "those pills make me so sleepy I can't hardly cope".  I am not sure what pills she is talking about.  I did not add any medication last visit.  Need clarification on which medication she is concerned about.  Thanks

## 2014-07-26 NOTE — Telephone Encounter (Signed)
Pt dropped off BP readings. Letter in Dr. Bary Leriche box/msn

## 2014-07-27 ENCOUNTER — Telehealth: Payer: Self-pay | Admitting: *Deleted

## 2014-07-27 NOTE — Telephone Encounter (Signed)
[name] notified

## 2014-07-27 NOTE — Telephone Encounter (Signed)
Per our records, she was on both hctz and lasix at her recent visit.  Given decreased sodium, we stopped the hctz and continued lasix.  If she feels she tolerates the hctz better, then can d/c lasix and start back on hctz 25mg  q day.

## 2014-07-27 NOTE — Telephone Encounter (Signed)
Per Dr. Collene Gobble to D/C Furosemide (Lasix) & restart HCTZ. Pt notified also

## 2014-07-27 NOTE — Addendum Note (Signed)
Addended by: Karlene Einstein D on: 07/27/2014 12:14 PM   Modules accepted: Orders

## 2014-07-31 LAB — ALKALINE PHOSPHATASE, ISOENZYMES
Alkaline Phosphatase: 171 IU/L — ABNORMAL HIGH (ref 39–117)
BONE FRACTION: 30 % (ref 14–68)
INTESTINAL FRAC.: 0 % (ref 0–18)
LIVER FRACTION: 70 % (ref 18–85)

## 2014-08-04 ENCOUNTER — Other Ambulatory Visit: Payer: Self-pay | Admitting: Internal Medicine

## 2014-08-13 ENCOUNTER — Telehealth: Payer: Self-pay | Admitting: *Deleted

## 2014-08-13 ENCOUNTER — Inpatient Hospital Stay
Admission: EM | Admit: 2014-08-13 | Discharge: 2014-08-17 | DRG: 603 | Disposition: A | Discharge status: Skilled Nursing Facility | Payer: Medicare Other | Attending: Internal Medicine | Admitting: Internal Medicine

## 2014-08-13 ENCOUNTER — Emergency Department: Payer: Medicare Other

## 2014-08-13 DIAGNOSIS — Z8673 Personal history of transient ischemic attack (TIA), and cerebral infarction without residual deficits: Secondary | ICD-10-CM

## 2014-08-13 DIAGNOSIS — I4891 Unspecified atrial fibrillation: Secondary | ICD-10-CM | POA: Diagnosis present

## 2014-08-13 DIAGNOSIS — Z9109 Other allergy status, other than to drugs and biological substances: Secondary | ICD-10-CM

## 2014-08-13 DIAGNOSIS — L03116 Cellulitis of left lower limb: Secondary | ICD-10-CM | POA: Diagnosis not present

## 2014-08-13 DIAGNOSIS — E119 Type 2 diabetes mellitus without complications: Secondary | ICD-10-CM

## 2014-08-13 DIAGNOSIS — I2510 Atherosclerotic heart disease of native coronary artery without angina pectoris: Secondary | ICD-10-CM | POA: Diagnosis present

## 2014-08-13 DIAGNOSIS — Z8542 Personal history of malignant neoplasm of other parts of uterus: Secondary | ICD-10-CM

## 2014-08-13 DIAGNOSIS — E7800 Pure hypercholesterolemia, unspecified: Secondary | ICD-10-CM | POA: Diagnosis present

## 2014-08-13 DIAGNOSIS — L0390 Cellulitis, unspecified: Secondary | ICD-10-CM | POA: Diagnosis present

## 2014-08-13 DIAGNOSIS — Z881 Allergy status to other antibiotic agents status: Secondary | ICD-10-CM

## 2014-08-13 DIAGNOSIS — Z79899 Other long term (current) drug therapy: Secondary | ICD-10-CM

## 2014-08-13 DIAGNOSIS — Z951 Presence of aortocoronary bypass graft: Secondary | ICD-10-CM

## 2014-08-13 DIAGNOSIS — E871 Hypo-osmolality and hyponatremia: Secondary | ICD-10-CM | POA: Diagnosis present

## 2014-08-13 DIAGNOSIS — M1990 Unspecified osteoarthritis, unspecified site: Secondary | ICD-10-CM | POA: Diagnosis present

## 2014-08-13 DIAGNOSIS — I10 Essential (primary) hypertension: Secondary | ICD-10-CM | POA: Diagnosis present

## 2014-08-13 DIAGNOSIS — D649 Anemia, unspecified: Secondary | ICD-10-CM | POA: Diagnosis present

## 2014-08-13 LAB — COMPREHENSIVE METABOLIC PANEL
ALBUMIN: 4.3 g/dL (ref 3.5–5.0)
ALK PHOS: 112 U/L (ref 38–126)
ALT: 20 U/L (ref 14–54)
AST: 24 U/L (ref 15–41)
Anion gap: 10 (ref 5–15)
BILIRUBIN TOTAL: 1.1 mg/dL (ref 0.3–1.2)
BUN: 20 mg/dL (ref 6–20)
CO2: 27 mmol/L (ref 22–32)
Calcium: 9.6 mg/dL (ref 8.9–10.3)
Chloride: 94 mmol/L — ABNORMAL LOW (ref 101–111)
Creatinine, Ser: 0.63 mg/dL (ref 0.44–1.00)
GFR calc Af Amer: >60 mL/min (ref >60)
GFR calc non Af Amer: >60 mL/min (ref >60)
Glucose, Bld: 188 mg/dL — ABNORMAL HIGH (ref 65–99)
POTASSIUM: 3.9 mmol/L (ref 3.5–5.1)
SODIUM: 131 mmol/L — AB (ref 135–145)
Total Protein: 7.6 g/dL (ref 6.5–8.1)

## 2014-08-13 LAB — CBC WITH DIFFERENTIAL/PLATELET
BASOS ABS: 0 10*3/uL (ref 0–0.1)
Basophils Relative: 0 %
Eosinophils Absolute: 0 10*3/uL (ref 0–0.7)
Eosinophils Relative: 0 %
HEMATOCRIT: 38.8 % (ref 35.0–47.0)
Hemoglobin: 12.7 g/dL (ref 12.0–16.0)
LYMPHS PCT: 10 %
Lymphs Abs: 1.6 10*3/uL (ref 1.0–3.6)
MCH: 28.2 pg (ref 26.0–34.0)
MCHC: 32.8 g/dL (ref 32.0–36.0)
MCV: 85.9 fL (ref 80.0–100.0)
MONO ABS: 1.5 10*3/uL — AB (ref 0.2–0.9)
MONOS PCT: 9 %
Neutro Abs: 13 10*3/uL — ABNORMAL HIGH (ref 1.4–6.5)
Neutrophils Relative %: 81 %
Platelets: 201 10*3/uL (ref 150–440)
RBC: 4.52 MIL/uL (ref 3.80–5.20)
RDW: 14 % (ref 11.5–14.5)
WBC: 16.1 10*3/uL — AB (ref 3.6–11.0)

## 2014-08-13 MED ORDER — VANCOMYCIN HCL IN DEXTROSE 750-5 MG/150ML-% IV SOLN
750.0000 mg | INTRAVENOUS | Status: DC
  Administered 2014-08-14: 750 mg via INTRAVENOUS
  Filled 2014-08-13 (×2): qty 150

## 2014-08-13 MED ORDER — VANCOMYCIN HCL IN DEXTROSE 1-5 GM/200ML-% IV SOLN: 1000.0000 mg | Freq: Once | INTRAVENOUS | Status: DC

## 2014-08-13 MED ORDER — VANCOMYCIN HCL IN DEXTROSE 1-5 GM/200ML-% IV SOLN
INTRAVENOUS | Status: AC
  Filled 2014-08-13: qty 200

## 2014-08-13 MED ORDER — ACETAMINOPHEN 500 MG PO TABS
1000.0000 mg | ORAL_TABLET | Freq: Once | ORAL | Status: AC
  Administered 2014-08-13: 1000 mg via ORAL

## 2014-08-13 MED ORDER — LISINOPRIL 20 MG PO TABS
40.0000 mg | ORAL_TABLET | ORAL | Status: DC
  Administered 2014-08-15 – 2014-08-17 (×3): 40 mg via ORAL
  Filled 2014-08-13 (×3): qty 2

## 2014-08-13 MED ORDER — SODIUM CHLORIDE 0.9 % IV SOLN
INTRAVENOUS | Status: AC
  Administered 2014-08-14: via INTRAVENOUS

## 2014-08-13 MED ORDER — HYDROCHLOROTHIAZIDE 25 MG PO TABS
25.0000 mg | ORAL_TABLET | Freq: Every day | ORAL | Status: DC
  Administered 2014-08-14 – 2014-08-16 (×3): 25 mg via ORAL
  Filled 2014-08-13 (×3): qty 1

## 2014-08-13 MED ORDER — ACETAMINOPHEN 650 MG RE SUPP: 650.0000 mg | Freq: Four times a day (QID) | RECTAL | Status: DC | PRN

## 2014-08-13 MED ORDER — SERTRALINE HCL 50 MG PO TABS
50.0000 mg | ORAL_TABLET | Freq: Every day | ORAL | Status: DC
  Administered 2014-08-14 – 2014-08-17 (×4): 50 mg via ORAL
  Filled 2014-08-13 (×4): qty 1

## 2014-08-13 MED ORDER — ACETAMINOPHEN 500 MG PO TABS
ORAL_TABLET | ORAL | Status: AC
Start: 2014-08-13 — End: 2014-08-13
  Filled 2014-08-13: qty 2

## 2014-08-13 MED ORDER — METOPROLOL TARTRATE 25 MG PO TABS
25.0000 mg | ORAL_TABLET | Freq: Two times a day (BID) | ORAL | Status: DC
  Administered 2014-08-14 – 2014-08-16 (×5): 25 mg via ORAL
  Filled 2014-08-13 (×5): qty 1

## 2014-08-13 MED ORDER — ACETAMINOPHEN 325 MG PO TABS
650.0000 mg | ORAL_TABLET | Freq: Four times a day (QID) | ORAL | Status: DC | PRN
Start: 2014-08-13 — End: 2014-08-17
  Administered 2014-08-14 – 2014-08-15 (×3): 650 mg via ORAL
  Filled 2014-08-13 (×3): qty 2

## 2014-08-13 MED ORDER — TRAMADOL HCL 50 MG PO TABS
50.0000 mg | ORAL_TABLET | Freq: Four times a day (QID) | ORAL | Status: DC | PRN
Start: 2014-08-13 — End: 2014-08-17
  Administered 2014-08-14 – 2014-08-17 (×4): 50 mg via ORAL
  Filled 2014-08-13 (×4): qty 1

## 2014-08-13 MED ORDER — APIXABAN 2.5 MG PO TABS
2.5000 mg | ORAL_TABLET | Freq: Two times a day (BID) | ORAL | Status: DC
  Administered 2014-08-14 – 2014-08-17 (×7): 2.5 mg via ORAL
  Filled 2014-08-13 (×7): qty 1

## 2014-08-13 MED ORDER — VANCOMYCIN HCL IN DEXTROSE 1-5 GM/200ML-% IV SOLN
1000.0000 mg | Freq: Once | INTRAVENOUS | Status: AC
  Administered 2014-08-13: 1000 mg via INTRAVENOUS

## 2014-08-13 NOTE — Telephone Encounter (Signed)
Spoke with [name], advised of MDs message.  Verbalized understanding.

## 2014-08-13 NOTE — Telephone Encounter (Signed)
If she is having swelling in only one leg and it extends from the ankle to knee - she needs evaluation today.  I recommend [facility] (or acute care) - can get ultrasound of leg if needed to confirm no clot.  Then can f/u here.

## 2014-08-13 NOTE — Telephone Encounter (Signed)
[name] called states pts Potassium and Furosemide were stopped.  Pt is having leg swelling today.  Pt is taking HCTZ, requesting whether pt should start Furosemide.  Please advise

## 2014-08-13 NOTE — ED Provider Notes (Signed)
[facility] Emergency Department Provider Note     Time seen: ----------------------------------------- 8:37 PM on 08/13/2014 -----------------------------------------    I have reviewed the triage vital signs and the nursing notes.   HISTORY  Chief Complaint Leg Swelling    HPI [name] is a 81 y.o. female who presents ER for redness swelling and pain in the left lower leg the past 2 days. Patient sent from [facility] to evaluate for a possible blood clot or to receive antibiotics. Patient is complaining a dull pain in left leg, nothing makes it better or worse. Has not had a history of same. Has had fever and question of chills.   Past Medical History  Diagnosis Date  . Type II or unspecified type diabetes mellitus without mention of complication, not stated as uncontrolled   . Essential hypertension, benign   . Anemia, unspecified   . Atrial fibrillation   . Hypercholesterolemia   . Osteoarthritis   . History of TIAs   . CVA (cerebral infarction)   . Atrial fibrillation   . Uterine cancer     s/p TAH/BSO  . CAD (coronary artery disease)     s/p CABG 10/99 with LIMA to the LAD, SVG to OM1, SVG to distal RCA    Patient Active Problem List   Diagnosis Date Noted  . Rash 07/02/2014  . Edema 01/28/2014  . Toenail fungus 06/25/2013  . CVA (cerebral vascular accident) 04/02/2013  . ICH (intracerebral hemorrhage) on anticoagulation, after recent LPCA infarct 01/26/2013  . Neuropathy 12/18/2012  . Hyponatremia 06/19/2012  . Atrial fibrillation 06/17/2012  . Essential hypertension, benign 06/17/2012  . CAD (coronary artery disease) 06/17/2012  . Peripheral vascular disease 06/17/2012  . Diabetes 06/17/2012  . Hypercholesterolemia 06/17/2012  . Depression 06/17/2012    Past Surgical History  Procedure Laterality Date  . Total abdominal hysterectomy w/ bilateral salpingoophorectomy  1969  . Coronary artery bypass graft  1999     x4  . Cataract extraction      Right eye    Allergies Erythromycin ethylsuccinate and Mycinette  Social History History  Substance Use Topics  . Smoking status: Never Smoker   . Smokeless tobacco: Never Used  . Alcohol Use: No    Review of Systems Constitutional: Positive for fever Eyes: Negative for visual changes. ENT: Negative for sore throat. Cardiovascular: Negative for chest pain. Respiratory: Negative for shortness of breath. Gastrointestinal: Negative for abdominal pain, vomiting and diarrhea. Genitourinary: Negative for dysuria. Musculoskeletal: Negative for back pain. Positive for left lower extremity pain Skin: Positive for left leg redness Neurological: Negative for headaches, focal weakness or numbness.  10-point ROS otherwise negative.  ____________________________________________   PHYSICAL EXAM:  VITAL SIGNS: ED Triage Vitals  Enc Vitals Group     BP 08/13/14 1734 179/58 mmHg     Pulse Rate 08/13/14 1734 76     Resp 08/13/14 1734 18     Temp 08/13/14 1734 98.9 F (37.2 C)     Temp Source 08/13/14 1734 Oral     SpO2 08/13/14 1734 95 %     Weight 08/13/14 1734 162 lb (73.483 kg)     Height 08/13/14 1734 5\' 7"  (1.702 m)     Head Cir --      Peak Flow --      Pain Score 08/13/14 1735 8     Pain Loc --      Pain Edu? --      Excl. in [place]? --  Constitutional: Alert and oriented. Well appearing and in no distress. Eyes: Conjunctivae are normal. PERRL. Normal extraocular movements. ENT   Head: Normocephalic and atraumatic.   Nose: No congestion/rhinnorhea.   Mouth/Throat: Mucous membranes are moist.   Neck: No stridor. Hematological/Lymphatic/Immunilogical: No cervical lymphadenopathy. Cardiovascular: Normal rate, regular rhythm. Normal and symmetric distal pulses are present in all extremities. Grade 3-6 systolic murmur consistent with aortic stenosis Respiratory: Normal respiratory effort without tachypnea nor retractions. Breath  sounds are clear and equal bilaterally. No wheezes/rales/rhonchi. Gastrointestinal: Soft and nontender. No distention. No abdominal bruits. There is no CVA tenderness. Musculoskeletal: Nontender with normal range of motion in all extremities. No joint effusions.  No lower extremity tenderness nor edema. Neurologic:  Normal speech and language. No gross focal neurologic deficits are appreciated. Speech is normal. No gait instability. Skin:  Edematous, erythematous, mildly tender left lower extremity below the knee. Psychiatric: Mood and affect are normal. Speech and behavior are normal. Patient exhibits appropriate insight and judgment. ____________________________________________  ED COURSE:  Pertinent labs & imaging results that were available during my care of the patient were reviewed by me and considered in my medical decision making (see chart for details). Patient will need labs, ultrasound. Also IV antibiotics to cover for cellulitis ____________________________________________    LABS (pertinent positives/negatives)  Labs Reviewed  CBC WITH DIFFERENTIAL/PLATELET - Abnormal; Notable for the following:    WBC 16.1 (*)    Neutro Abs 13.0 (*)    Monocytes Absolute 1.5 (*)    All other components within normal limits  COMPREHENSIVE METABOLIC PANEL - Abnormal; Notable for the following:    Sodium 131 (*)    Chloride 94 (*)    Glucose, Bld 188 (*)    All other components within normal limits  CULTURE, BLOOD (ROUTINE X 2)  CULTURE, BLOOD (ROUTINE X 2)    RADIOLOGY Lower extremity Ultrasound IMPRESSION: No evidence of deep venous thrombosis. ____________________________________________  FINAL ASSESSMENT AND PLAN  Cellulitis  Plan: Patient has received IV vancomycin here. We have also received 2 blood cultures. Patient in significant pain, with significant leukocytosis. Will recommend observation the hospital for continued IV antibiotics.   Earleen Newport,  MD   Earleen Newport, MD 08/13/14 [phone]

## 2014-08-13 NOTE — Telephone Encounter (Signed)
Spoke with [name], she states it is just the right leg from ankle to just below the knee with bluish red color.  It is painful, pt states it burns.  Denies sob.  Please advise

## 2014-08-13 NOTE — ED Notes (Signed)
Pt's daughter contact info :  [facility] - [phone] home - [phone]

## 2014-08-13 NOTE — H&P (Signed)
[name] at [place] NAME: [name]    MR#:  [id number]  DATE OF BIRTH:  08/31/1922  DATE OF ADMISSION:  08/13/2014  PRIMARY CARE PHYSICIAN: Einar Pheasant, MD   REQUESTING/REFERRING PHYSICIAN: Jimmye Norman  CHIEF COMPLAINT:   Chief Complaint  Patient presents with  . Leg Swelling    left leg    HISTORY OF PRESENT ILLNESS:  [name]  is a 81 y.o. female who presents with left lower extremity cellulitis. Patient states this started about 2 days ago, first with erythema and then developing pain. Patient states that it is making it difficult for her to walk, she has underlying arthritis at baseline. She feels that she's had some fevers at home, and an evaluation in the ED today she does have an elevated white count as well, though she is afebrile at this time and her fevers at home were not measured. The cellulitis is fairly extensive, starting near her ankle and extending up to just below her knee. Hospitalists were called for admission for IV antibiotics for her cellulitis.  PAST MEDICAL HISTORY:   Past Medical History  Diagnosis Date  . Type II or unspecified type diabetes mellitus without mention of complication, not stated as uncontrolled   . Essential hypertension, benign   . Anemia, unspecified   . Atrial fibrillation   . Hypercholesterolemia   . Osteoarthritis   . History of TIAs   . CVA (cerebral infarction)   . Atrial fibrillation   . Uterine cancer     s/p TAH/BSO  . CAD (coronary artery disease)     s/p CABG 10/99 with LIMA to the LAD, SVG to OM1, SVG to distal RCA    PAST SURGICAL HISTORY:   Past Surgical History  Procedure Laterality Date  . Total abdominal hysterectomy w/ bilateral salpingoophorectomy  1969  . Coronary artery bypass graft  1999    x4  . Cataract extraction      Right eye    SOCIAL HISTORY:   History  Substance Use Topics  . Smoking status: Never Smoker   . Smokeless tobacco:  Never Used  . Alcohol Use: No    FAMILY HISTORY:   Family History  Problem Relation Age of Onset  . Heart disease Mother 19    Died of heart attack  . Stroke Father 82    died of cva  . CAD Brother     multiple brothers    DRUG ALLERGIES:   Allergies  Allergen Reactions  . Erythromycin Ethylsuccinate Other (See Comments)    REACTION:  unknown  . Mycinette [Phenol] Other (See Comments)    REACTION:  unknown    MEDICATIONS AT HOME:   Prior to Admission medications   Medication Sig Start Date End Date Taking? Authorizing Provider  clotrimazole-betamethasone (LOTRISONE) cream Apply 1 application topically 2 (two) times daily. 11/09/13   Trula Slade, DPM  ELIQUIS 2.5 MG TABS tablet TAKE 1 TABLET BY MOUTH TWICE A DAY 08/06/14   Einar Pheasant, MD  glucose blood test strip 1 each by Other route as needed for other. Use one test strip bid    Historical Provider, MD  hydrochlorothiazide (HYDRODIURIL) 25 MG tablet Take 1 tablet (25 mg total) by mouth daily. 06/29/14   Einar Pheasant, MD  KLOR-CON M10 10 MEQ tablet TAKE 1 TABLET (10 MEQ TOTAL) BY MOUTH DAILY. 04/16/14   Einar Pheasant, MD  lisinopril (PRINIVIL,ZESTRIL) 40 MG tablet Take 1 tablet (40 mg  total) by mouth every morning. 06/29/14   Einar Pheasant, MD  metoprolol tartrate (LOPRESSOR) 25 MG tablet Take 1 tablet (25 mg total) by mouth 2 (two) times daily. 06/29/14   Einar Pheasant, MD  nystatin cream (MYCOSTATIN) Apply 1 application topically 2 (two) times daily. 06/29/14   Einar Pheasant, MD  senna-docusate (SENOKOT-S) 8.6-50 MG per tablet Take 1 tablet by mouth 2 (two) times daily. 01/31/13   Dorian Heckle, MD  sertraline (ZOLOFT) 50 MG tablet Take 1 tablet (50 mg total) by mouth daily. 11/13/13   Einar Pheasant, MD  traMADol (ULTRAM) 50 MG tablet Take 1 tablet (50 mg total) by mouth 2 (two) times daily as needed. 01/26/14   Einar Pheasant, MD    REVIEW OF SYSTEMS:  Review of Systems  Constitutional: Positive for fever  (subjective). Negative for chills, weight loss and malaise/fatigue.  HENT: Negative for ear pain, hearing loss and tinnitus.   Eyes: Negative for blurred vision, double vision, pain and redness.  Respiratory: Negative for cough, hemoptysis and shortness of breath.   Cardiovascular: Negative for chest pain, palpitations, orthopnea and leg swelling.  Gastrointestinal: Negative for nausea, vomiting, abdominal pain, diarrhea and constipation.  Genitourinary: Negative for dysuria, frequency and hematuria.  Musculoskeletal: Negative for back pain, joint pain and neck pain.  Skin:       Significant left lower extremity cellulitis, see history of present illness for details, no acne, rash, or lesions  Neurological: Negative for dizziness, tremors, focal weakness and weakness.  Endo/Heme/Allergies: Negative for polydipsia. Does not bruise/bleed easily.  Psychiatric/Behavioral: Negative for depression. The patient is not nervous/anxious and does not have insomnia.      VITAL SIGNS:   Filed Vitals:   08/13/14 1734  BP: 179/58  Pulse: 76  Temp: 98.9 F (37.2 C)  TempSrc: Oral  Resp: 18  Height: 5\' 7"  (1.702 m)  Weight: 73.483 kg (162 lb)  SpO2: 95%   Wt Readings from Last 3 Encounters:  08/13/14 73.483 kg (162 lb)  06/29/14 64.864 kg (143 lb)  11/09/13 62.596 kg (138 lb)    PHYSICAL EXAMINATION:  Physical Exam  Vitals reviewed. Constitutional: She is oriented to person, place, and time. She appears well-developed and well-nourished. No distress.  HENT:  Head: Normocephalic and atraumatic.  Mouth/Throat: Oropharynx is clear and moist.  Eyes: Conjunctivae and EOM are normal. Pupils are equal, round, and reactive to light. No scleral icterus.  Neck: Normal range of motion. Neck supple. No JVD present. No thyromegaly present.  Cardiovascular: Normal rate, regular rhythm and intact distal pulses.  Exam reveals no gallop and no friction rub.   Murmur (3/6 systolic murmur)  heard. Respiratory: Effort normal and breath sounds normal. No respiratory distress. She has no wheezes. She has no rales.  GI: Soft. Bowel sounds are normal. She exhibits no distension. There is no tenderness.  Musculoskeletal: Normal range of motion. She exhibits no edema.  No arthritis, no gout  Lymphadenopathy:    She has no cervical adenopathy.  Neurological: She is alert and oriented to person, place, and time. No cranial nerve deficit.  No dysarthria, no aphasia  Skin: Skin is warm and dry. No rash noted. There is erythema (extending from the area of her ankle up to just below her knee on the left lower extremity, associated with significant tenderness).  Psychiatric: She has a normal mood and affect. Her behavior is normal. Judgment and thought content normal.    LABORATORY PANEL:   CBC  Recent Labs Lab 08/13/14 2054  WBC 16.1*  HGB 12.7  HCT 38.8  PLT 201   ------------------------------------------------------------------------------------------------------------------  Chemistries   Recent Labs Lab 08/13/14 2054  NA 131*  K 3.9  CL 94*  CO2 27  GLUCOSE 188*  BUN 20  CREATININE 0.63  CALCIUM 9.6  AST 24  ALT 20  ALKPHOS 112  BILITOT 1.1   ------------------------------------------------------------------------------------------------------------------  Cardiac Enzymes No results for input(s): TROPONINI in the last 168 hours. ------------------------------------------------------------------------------------------------------------------  RADIOLOGY:  US Venous Img Lower Unilateral Left  08/13/2014   CLINICAL DATA:  Left lower extremity pain and swelling for 2 days.  EXAM: LEFT LOWER EXTREMITY VENOUS DOPPLER ULTRASOUND  TECHNIQUE: Gray-scale sonography with graded compression, as well as color Doppler and duplex ultrasound were performed to evaluate the lower extremity deep venous systems from the level of the common femoral vein and including the common  femoral, femoral, profunda femoral, popliteal and calf veins including the posterior tibial, peroneal and gastrocnemius veins when visible. The superficial great saphenous vein was also interrogated. Spectral Doppler was utilized to evaluate flow at rest and with distal augmentation maneuvers in the common femoral, femoral and popliteal veins.  COMPARISON:  Bilateral lower extremity duplex 01/03/2014  FINDINGS: Contralateral Common Femoral Vein: Respiratory phasicity is normal and symmetric with the symptomatic side. No evidence of thrombus. Normal compressibility.  Common Femoral Vein: No evidence of thrombus. Normal compressibility, respiratory phasicity and response to augmentation.  Saphenofemoral Junction: No evidence of thrombus. Normal compressibility and flow on color Doppler imaging.  Profunda Femoral Vein: No evidence of thrombus. Normal compressibility and flow on color Doppler imaging.  Femoral Vein: No evidence of thrombus. Normal compressibility, respiratory phasicity and response to augmentation.  Popliteal Vein: No evidence of thrombus. Normal compressibility, respiratory phasicity and response to augmentation.  Calf Veins: No evidence of thrombus. Normal flow on color Doppler imaging. Technically limited evaluation, however blood flow seen.  Superficial Great Saphenous Vein: No evidence of thrombus. Normal compressibility and flow on color Doppler imaging.  Venous Reflux:  None.  Other Findings:  None.  IMPRESSION: No evidence of deep venous thrombosis.   Electronically Signed   By: Jeb Levering M.D.   On: 08/13/2014 18:41    EKG:   Orders placed or performed in visit on 06/19/14  . EKG 12-Lead    IMPRESSION AND PLAN:  Principal Problem:   Cellulitis of left leg - with elevated white count, given IV antibiotics in the ED, we will admit to observation, continue IV antibiotics, recheck white count in the morning. Doppler extremity showed no evidence for DVT. Blood culture sent from the  ED. Active Problems:   Atrial fibrillation - chronic problem, continue home rate controlling medicine   Essential hypertension, benign - chronic stable problem, continue home medications   Diabetes, type II - carb modified diet, before meals at bedtime glucose checks, patient does not seem to be on any anti-glycemic medications at home, controlling her diabetes with diet alone.   CAD (coronary artery disease) - continue home medications    All the records are reviewed and case discussed with ED provider. Management plans discussed with the patient and/or family.  DVT PROPHYLAXIS: Systemic anticoagulation  ADMISSION STATUS: Observation  CODE STATUS: Full Advance Directive Documentation        Most Recent Value   Type of Advance Directive  Healthcare Power of Attorney   Pre-existing out of facility DNR order (yellow form or pink MOST form)     "MOST" Form in Place?  TOTAL TIME TAKING CARE OF THIS PATIENT: 45 minutes.    Irven Ingalsbe [place] 08/13/2014, 10:27 PM  [facility]  Office  [phone]  CC: Primary care physician; Einar Pheasant, MD

## 2014-08-13 NOTE — Progress Notes (Signed)
ANTIBIOTIC CONSULT NOTE - INITIAL  Pharmacy Consult for vancomycin dosing Indication: cellulitis  Allergies  Allergen Reactions  . Erythromycin Ethylsuccinate Other (See Comments)    REACTION:  unknown  . Mycinette [Phenol] Other (See Comments)    REACTION:  unknown    Patient Measurements: Height: 5\' 7"  (170.2 cm) Weight: 146 lb (66.225 kg) IBW/kg (Calculated) : 61.6 Adjusted Body Weight: 66.2 kg  Vital Signs: Temp: 98 F (36.7 C) (06/27 2326) Temp Source: Oral (06/27 2326) BP: 159/54 mmHg (06/27 2326) Pulse Rate: 82 (06/27 2326) Intake/Output from previous day:   Intake/Output from this shift: Total I/O In: -  Out: 50 [Urine:50]  Labs:  Recent Labs  08/13/14 2054  WBC 16.1*  HGB 12.7  PLT 201  CREATININE 0.63   Estimated Creatinine Clearance: 45.5 mL/min (by C-G formula based on Cr of 0.63). No results for input(s): VANCOTROUGH, VANCOPEAK, VANCORANDOM, GENTTROUGH, GENTPEAK, GENTRANDOM, TOBRATROUGH, TOBRAPEAK, TOBRARND, AMIKACINPEAK, AMIKACINTROU, AMIKACIN in the last 72 hours.   Microbiology: No results found for this or any previous visit (from the past 720 hour(s)).  Medical History: Past Medical History  Diagnosis Date  . Type II or unspecified type diabetes mellitus without mention of complication, not stated as uncontrolled   . Essential hypertension, benign   . Anemia, unspecified   . Atrial fibrillation   . Hypercholesterolemia   . Osteoarthritis   . History of TIAs   . CVA (cerebral infarction)   . Atrial fibrillation   . Uterine cancer     s/p TAH/BSO  . CAD (coronary artery disease)     s/p CABG 10/99 with LIMA to the LAD, SVG to OM1, SVG to distal RCA    Medications:   Assessment: Blood cx pending  Goal of Therapy:  Vancomycin trough level 15-20 mcg/ml  Plan:  TBW 66.2kg  IBW 61.6kg  DW 66.2kg  Vd 46 kei 0.042 hr-1  t1/2 17 hours 1 gram in ED. 750 mg q 18 hours ordered to follow with stacked dosing. Trough ordered before 5th  dose.    Sim Boast, PharmD, BCPS  08/13/2014

## 2014-08-13 NOTE — Telephone Encounter (Signed)
Need more information.  Is it both legs?  Just feet and ankles or does it extend up the legs?  Any sob?

## 2014-08-13 NOTE — ED Notes (Signed)
Pt c/o redness, swelling and pain of the left lower leg for the past 2 days.Marland Kitchen

## 2014-08-14 ENCOUNTER — Encounter: Payer: Self-pay | Admitting: *Deleted

## 2014-08-14 DIAGNOSIS — E119 Type 2 diabetes mellitus without complications: Secondary | ICD-10-CM | POA: Diagnosis present

## 2014-08-14 DIAGNOSIS — I4891 Unspecified atrial fibrillation: Secondary | ICD-10-CM | POA: Diagnosis present

## 2014-08-14 DIAGNOSIS — D649 Anemia, unspecified: Secondary | ICD-10-CM | POA: Diagnosis present

## 2014-08-14 DIAGNOSIS — E871 Hypo-osmolality and hyponatremia: Secondary | ICD-10-CM | POA: Diagnosis present

## 2014-08-14 DIAGNOSIS — L0390 Cellulitis, unspecified: Secondary | ICD-10-CM | POA: Diagnosis present

## 2014-08-14 DIAGNOSIS — M1990 Unspecified osteoarthritis, unspecified site: Secondary | ICD-10-CM | POA: Diagnosis present

## 2014-08-14 DIAGNOSIS — Z9109 Other allergy status, other than to drugs and biological substances: Secondary | ICD-10-CM | POA: Diagnosis not present

## 2014-08-14 DIAGNOSIS — E780 Pure hypercholesterolemia: Secondary | ICD-10-CM | POA: Diagnosis present

## 2014-08-14 DIAGNOSIS — Z79899 Other long term (current) drug therapy: Secondary | ICD-10-CM | POA: Diagnosis not present

## 2014-08-14 DIAGNOSIS — L03116 Cellulitis of left lower limb: Secondary | ICD-10-CM | POA: Diagnosis present

## 2014-08-14 DIAGNOSIS — Z8673 Personal history of transient ischemic attack (TIA), and cerebral infarction without residual deficits: Secondary | ICD-10-CM | POA: Diagnosis not present

## 2014-08-14 DIAGNOSIS — Z8542 Personal history of malignant neoplasm of other parts of uterus: Secondary | ICD-10-CM | POA: Diagnosis not present

## 2014-08-14 DIAGNOSIS — Z881 Allergy status to other antibiotic agents status: Secondary | ICD-10-CM | POA: Diagnosis not present

## 2014-08-14 DIAGNOSIS — Z951 Presence of aortocoronary bypass graft: Secondary | ICD-10-CM | POA: Diagnosis not present

## 2014-08-14 DIAGNOSIS — I10 Essential (primary) hypertension: Secondary | ICD-10-CM | POA: Diagnosis present

## 2014-08-14 DIAGNOSIS — I2510 Atherosclerotic heart disease of native coronary artery without angina pectoris: Secondary | ICD-10-CM | POA: Diagnosis present

## 2014-08-14 LAB — BASIC METABOLIC PANEL
ANION GAP: 9 (ref 5–15)
BUN: 16 mg/dL (ref 6–20)
CO2: 26 mmol/L (ref 22–32)
Calcium: 9 mg/dL (ref 8.9–10.3)
Chloride: 95 mmol/L — ABNORMAL LOW (ref 101–111)
Creatinine, Ser: 0.61 mg/dL (ref 0.44–1.00)
GFR calc Af Amer: >60 mL/min (ref >60)
GFR calc non Af Amer: >60 mL/min (ref >60)
Glucose, Bld: 143 mg/dL — ABNORMAL HIGH (ref 65–99)
POTASSIUM: 3.8 mmol/L (ref 3.5–5.1)
SODIUM: 130 mmol/L — AB (ref 135–145)

## 2014-08-14 LAB — CBC
HCT: 35.1 % (ref 35.0–47.0)
Hemoglobin: 11.4 g/dL — ABNORMAL LOW (ref 12.0–16.0)
MCH: 27.6 pg (ref 26.0–34.0)
MCHC: 32.5 g/dL (ref 32.0–36.0)
MCV: 85.1 fL (ref 80.0–100.0)
Platelets: 193 K/uL (ref 150–440)
RBC: 4.12 MIL/uL (ref 3.80–5.20)
RDW: 13.9 % (ref 11.5–14.5)
WBC: 18.9 K/uL — ABNORMAL HIGH (ref 3.6–11.0)

## 2014-08-14 LAB — GLUCOSE, CAPILLARY
GLUCOSE-CAPILLARY: 164 mg/dL — AB (ref 65–99)
GLUCOSE-CAPILLARY: 176 mg/dL — AB (ref 65–99)
Glucose-Capillary: 140 mg/dL — ABNORMAL HIGH (ref 65–99)

## 2014-08-14 MED ORDER — AMOXICILLIN-POT CLAVULANATE 875-125 MG PO TABS
1.0000 | ORAL_TABLET | Freq: Two times a day (BID) | ORAL | Status: DC
  Administered 2014-08-14 – 2014-08-15 (×3): 1 via ORAL
  Filled 2014-08-14 (×3): qty 1

## 2014-08-14 NOTE — Progress Notes (Signed)
[facility] NAME: [name]    MR#:  [id number]  DATE OF BIRTH:  [birth year]  SUBJECTIVE:  CHIEF COMPLAINT:   Chief Complaint  Patient presents with  . Leg Swelling    left leg    REVIEW OF SYSTEMS:  CONSTITUTIONAL: No fever, fatigue or weakness.  EYES: No blurred or double vision.  EARS, NOSE, AND THROAT: No tinnitus or ear pain.  RESPIRATORY: No cough, shortness of breath, wheezing or hemoptysis.  CARDIOVASCULAR: No chest pain, orthopnea, edema.  GASTROINTESTINAL: No nausea, vomiting, diarrhea or abdominal pain.  GENITOURINARY: No dysuria, hematuria.  ENDOCRINE: No polyuria, nocturia,  HEMATOLOGY: No anemia, easy bruising or bleeding SKIN: No rash or lesion. Redness on left leg. MUSCULOSKELETAL: No joint pain or arthritis.   NEUROLOGIC: No tingling, numbness, weakness.  PSYCHIATRY: No anxiety or depression.   ROS  DRUG ALLERGIES:   Allergies  Allergen Reactions  . Erythromycin Ethylsuccinate Other (See Comments)    REACTION:  unknown  . Mycinette [Phenol] Other (See Comments)    REACTION:  unknown    VITALS:  Blood pressure 114/56, pulse 91, temperature 100.2 F (37.9 C), temperature source Oral, resp. rate 18, height 5\' 7"  (1.702 m), weight 66.225 kg (146 lb), SpO2 95 %.  PHYSICAL EXAMINATION:  GENERAL:  81 y.o.-year-old patient lying in the bed with no acute distress.  EYES: Pupils equal, round, reactive to light and accommodation. No scleral icterus. Extraocular muscles intact.  HEENT: Head atraumatic, normocephalic. Oropharynx and nasopharynx clear.  NECK:  Supple, no jugular venous distention. No thyroid enlargement, no tenderness.  LUNGS: Normal breath sounds bilaterally, no wheezing, rales,rhonchi or crepitation. No use of accessory muscles of respiration.  CARDIOVASCULAR: S1, S2 normal. No murmurs, rubs, or gallops.  ABDOMEN: Soft, nontender, nondistended. Bowel sounds present. No organomegaly  or mass.  EXTREMITIES: on left leg from ankle to knee- redness present. Minimal swelling. NEUROLOGIC: Cranial nerves II through XII are intact. Muscle strength 5/5 in all extremities. Sensation intact. Gait not checked.  PSYCHIATRIC: The patient is alert and oriented x 3.  SKIN: No obvious rash, lesion, or ulcer.   Physical Exam LABORATORY PANEL:   CBC  Recent Labs Lab 08/14/14 0521  WBC 18.9*  HGB 11.4*  HCT 35.1  PLT 193   ------------------------------------------------------------------------------------------------------------------  Chemistries   Recent Labs Lab 08/13/14 2054 08/14/14 0521  NA 131* 130*  K 3.9 3.8  CL 94* 95*  CO2 27 26  GLUCOSE 188* 143*  BUN 20 16  CREATININE 0.63 0.61  CALCIUM 9.6 9.0  AST 24  --   ALT 20  --   ALKPHOS 112  --   BILITOT 1.1  --    ------------------------------------------------------------------------------------------------------------------  Cardiac Enzymes No results for input(s): TROPONINI in the last 168 hours. ------------------------------------------------------------------------------------------------------------------  RADIOLOGY:  US Venous Img Lower Unilateral Left  08/13/2014   CLINICAL DATA:  Left lower extremity pain and swelling for 2 days.  EXAM: LEFT LOWER EXTREMITY VENOUS DOPPLER ULTRASOUND  TECHNIQUE: Gray-scale sonography with graded compression, as well as color Doppler and duplex ultrasound were performed to evaluate the lower extremity deep venous systems from the level of the common femoral vein and including the common femoral, femoral, profunda femoral, popliteal and calf veins including the posterior tibial, peroneal and gastrocnemius veins when visible. The superficial great saphenous vein was also interrogated. Spectral Doppler was utilized to evaluate flow at rest and with distal augmentation maneuvers in the common femoral, femoral and popliteal  veins.  COMPARISON:  Bilateral lower extremity  duplex 01/03/2014  FINDINGS: Contralateral Common Femoral Vein: Respiratory phasicity is normal and symmetric with the symptomatic side. No evidence of thrombus. Normal compressibility.  Common Femoral Vein: No evidence of thrombus. Normal compressibility, respiratory phasicity and response to augmentation.  Saphenofemoral Junction: No evidence of thrombus. Normal compressibility and flow on color Doppler imaging.  Profunda Femoral Vein: No evidence of thrombus. Normal compressibility and flow on color Doppler imaging.  Femoral Vein: No evidence of thrombus. Normal compressibility, respiratory phasicity and response to augmentation.  Popliteal Vein: No evidence of thrombus. Normal compressibility, respiratory phasicity and response to augmentation.  Calf Veins: No evidence of thrombus. Normal flow on color Doppler imaging. Technically limited evaluation, however blood flow seen.  Superficial Great Saphenous Vein: No evidence of thrombus. Normal compressibility and flow on color Doppler imaging.  Venous Reflux:  None.  Other Findings:  None.  IMPRESSION: No evidence of deep venous thrombosis.   Electronically Signed   By: Jeb Levering M.D.   On: 08/13/2014 18:41    ASSESSMENT AND PLAN:   * Cellulitis of left leg -    No DVT- On IV vanc- added Rocephin.   Bl cx sent.   As per pt- swelling is much better now.  *  Atrial fibrillation - chronic problem, continue home rate controlling medicine * Essential hypertension, benign - chronic stable problem, continue home medications *  Diabetes, type II - carb modified diet, before meals at bedtime glucose checks, patient does not seem to be on any anti-glycemic medications at home, controlling her diabetes with diet alone. * CAD (coronary artery disease) - continue home medications  Will get PT eval.  All the records are reviewed and case discussed with Care Management/Social Workerr. Management plans discussed with the patient, family and they are in  agreement.  CODE STATUS: Full  TOTAL TIME TAKING CARE OF THIS PATIENT: 35 minutes.   More than 50% of the visit was spent in counseling/coordination of care  POSSIBLE D/C IN 1-2 DAYS, DEPENDING ON CLINICAL CONDITION.   Vaughan Basta M.D on 08/14/2014   Between 7am to 6pm - Pager - [phone]  After 6pm go to [email or web address] - password [email or web address]  Office  [phone]  CC: Primary care physician; Einar Pheasant, MD

## 2014-08-14 NOTE — Progress Notes (Signed)
Pt admit for cellulitis to left lower leg, red, no open areas. A&O. OOB to [facility] - [facility]. Pain controlled w/ PRN meds. Cont on IV antibiotics. Rested quietly between care. No acute distress, will cont to monitor.

## 2014-08-14 NOTE — Clinical Social Work Note (Signed)
Clinical Social Work Assessment  Patient Details  Name: [name] MRN: [id number] Date of Birth: 01/31/1923  Date of referral:  08/14/14               Reason for consult:  Facility Placement                Permission sought to share information with:  Chartered certified accountant granted to share information::  Yes, Verbal Permission Granted  Name::      [place]::   [place]   Relationship::     Contact Information:     Housing/Transportation Living arrangements for the past 2 months:  [place] of Information:  Patient Patient Interpreter Needed:  None Criminal Activity/Legal Involvement Pertinent to Current Situation/Hospitalization:  No - Comment as needed Significant Relationships:  Adult Children Lives with:  Adult Children Do you feel safe going back to the place where you live?  Yes Need for family participation in patient care:  Yes (Comment)  Care giving concerns:  Patient lives with her daughter [name] in [place] .   Social Worker assessment / plan:  Holiday representative (CSW) met with patient to discuss D/C plan. Patient changed from observation to inpatient today. PT is recommending SNF. CSW introduced self and explained role of CSW department. Patient was sitting up in the chair and just woke up. Per RN staff patient has been sleeping in the chair for most of the day. Patient reported that she lives in Home with her daughter [name]. Patient met with patient briefly as she said she was tired. Patient is agreeable to SNF search and requested CSW for call patient's daughter. CSW contacted patient's daughter [name] but was unable to leave a voicemail because her box was full.   FL2 complete and faxed out. Patient has been to [facility] in the past.     Employment status:  Retired Forensic scientist:  Medicare PT Recommendations:  [place] / Referral to  community resources:  [place]  Patient/Family's Response to care:  Patient is agreeable to [facility].   Patient/Family's Understanding of and Emotional Response to Diagnosis, Current Treatment, and Prognosis:  Patient was pleasant and sitting up in chair. Patient thanked CSW for visit and assisting with placement.   Emotional Assessment Appearance:    Attitude/Demeanor/Rapport:    Affect (typically observed):  Accepting, Pleasant, Quiet Orientation:  Oriented to Self, Oriented to Place, Oriented to  Time, Oriented to Situation Alcohol / Substance use:  Not Applicable Psych involvement (Current and /or in the community):  No (Comment)  Discharge Needs  Concerns to be addressed:  Discharge Planning Concerns Readmission within the last 30 days:  No Current discharge risk:  Chronically ill Barriers to Discharge:  Continued Medical Work up   Loralyn Freshwater, LCSW 08/14/2014, 3:07 PM

## 2014-08-14 NOTE — Evaluation (Signed)
Physical Therapy Evaluation Patient Details Name: [name] MRN: [id number] DOB: [birth year] Today's Date: 08/14/2014   History of Present Illness  Patient is a 81 y/o female that presents with cellulitis of her LLE x2 days, which has become very painful for her. She has been ruled out for DVT at this time and is on IV antibiotics. Patient has a history of CVA with visual deficits.   Clinical Impression  Patient is a pleasant 81 y/o female that presents initially having just woken up, and struggles to maintain awareness throughout the session. She ends up repeating phrases and verbally indicating she felt "out of it" throughout the session. Patient was able to bear weight on LLE (minimally) and provides good effort today with encouragement. She does not appear oriented to conversational situations (she is to being in a hospital and her birth year) and therefore this is likely contributed by decreased cognition secondary to medications treating her LLE infection. Given her pain levels with weight bearing on her LLE, she struggles with ambulation tolerance and safety, and would benefit from skilled acute PT services to address the above limitations.     Follow Up Recommendations SNF    Equipment Recommendations  Rolling walker with 5" wheels    Recommendations for Other Services       Precautions / Restrictions Precautions Precautions: Fall Restrictions Weight Bearing Restrictions: No      Mobility  Bed Mobility Overal bed mobility: Needs Assistance Bed Mobility: Supine to Sit     Supine to sit: Supervision;HOB elevated     General bed mobility comments: Patient required some verbal cuing to complete transfer (for technique). Increased time secondary to pain.   Transfers Overall transfer level: Needs assistance Equipment used: Rolling walker (2 wheeled) Transfers: Sit to/from Stand Sit to Stand: Mod assist         General transfer comment: Patient very hesitant to  weight bear through LLE and was unable to stand independently.   Ambulation/Gait Ambulation/Gait assistance: Min guard Ambulation Distance (Feet): 3 Feet Assistive device: Rolling walker (2 wheeled) Gait Pattern/deviations: Decreased step length - right;Decreased step length - left;Step-to pattern;Decreased stance time - left     General Gait Details: Minimal step length with minimal weight bearing on LLE secondary to pain. Patient required moderate verbal encouragement to complete ambulation, though she was able to do so without external physical assistance (aside from RW).   Stairs            Wheelchair Mobility    Modified Rankin (Stroke Patients Only)       Balance Overall balance assessment: Needs assistance Sitting-balance support: Feet supported Sitting balance-Leahy Scale: Fair       Standing balance-Leahy Scale: Poor Standing balance comment: Pt required an AD secondary to LE weakness and pain in LLE.                              Pertinent Vitals/Pain Pain Assessment: Faces Pain Location: LLE  Pain Intervention(s): Limited activity within patient's tolerance;Monitored during session;Relaxation    Home Living Family/patient expects to be discharged to:: Private residence Living Arrangements: Children Available Help at Discharge: Family   Home Access: Stairs to enter Entrance Stairs-Rails: None Entrance Stairs-Number of Steps: 1 Home Layout: One level Home Equipment: Walker - standard;Shower seat Additional Comments: Difficult to assess her current equipment as she was very lethargic during this session.     Prior Function Level of Independence: Independent with  assistive device(s)         Comments: Had been using a standard walker?      Hand Dominance        Extremity/Trunk Assessment   Upper Extremity Assessment: Overall WFL for tasks assessed           Lower Extremity Assessment: Overall WFL for tasks assessed (Deferred  testing of LLE below knee secondary to pain)      Cervical / Trunk Assessment: Kyphotic  Communication   Communication: No difficulties  Cognition Arousal/Alertness: Lethargic;Suspect due to medications Behavior During Therapy: [facility] for tasks assessed/performed Overall Cognitive Status: Impaired/Different from baseline Area of Impairment: Attention;Memory;Following commands;Awareness     Memory: Decreased short-term memory Following Commands: Follows one step commands consistently       General Comments: Patient repeated herself throughout the session and seemed generally lethargic.     General Comments      Exercises        Assessment/Plan    PT Assessment Patient needs continued PT services  PT Diagnosis Difficulty walking;Acute pain;Generalized weakness;Abnormality of gait   PT Problem List Decreased strength;Pain;Cardiopulmonary status limiting activity;Decreased mobility;Decreased balance;Decreased activity tolerance;Decreased knowledge of use of DME  PT Treatment Interventions Gait training;Balance training;DME instruction;Therapeutic activities;Therapeutic exercise   PT Goals (Current goals can be found in the Care Plan section) Acute Rehab PT Goals Patient Stated Goal: To walk without pain as soon as possible.  PT Goal Formulation: With patient Time For Goal Achievement: 08/28/14 Potential to Achieve Goals: Good    Frequency Min 2X/week   Barriers to discharge Decreased caregiver support Pt lives with daughter who was not present on exam. Patient requires fairly heavy physical assistance to transfer to standing, which is not likely to be safely provided at home.     Co-evaluation               End of Session Equipment Utilized During Treatment: Gait belt Activity Tolerance: Patient limited by fatigue;Patient limited by lethargy;Patient limited by pain Patient left: in chair;with call bell/phone within reach;with chair alarm set Nurse Communication:  Mobility status         Time: 5638-9373 PT Time Calculation (min) (ACUTE ONLY): 21 min   Charges:   PT Evaluation $Initial PT Evaluation Tier I: 1 Procedure     PT G Codes:       Kerman Passey, PT, DPT   08/14/2014, 2:52 PM

## 2014-08-14 NOTE — Clinical Social Work Placement (Signed)
   CLINICAL SOCIAL WORK PLACEMENT  NOTE  Date:  08/14/2014  Patient Details  Name: [name] MRN: [id number] Date of Birth: 12-08-1922  Clinical Social Work is seeking post-discharge placement for this patient at the [place] level of care (*CSW will initial, date and re-position this form in  chart as items are completed):  Yes   Patient/family provided with [facility] Work Department's list of facilities offering this level of care within the geographic area requested by the patient (or if unable, by the patient's family).  Yes   Patient/family informed of their freedom to choose among providers that offer the needed level of care, that participate in Medicare, Medicaid or managed care program needed by the patient, have an available bed and are willing to accept the patient.  Yes   Patient/family informed of [facility]'s ownership interest in [facility] and [facility], as well as of the fact that they are under no obligation to receive care at these facilities.  PASRR submitted to EDS on       PASRR number received on       Existing PASRR number confirmed on 08/14/14     FL2 transmitted to all facilities in geographic area requested by pt/family on 08/14/14     FL2 transmitted to all facilities within larger geographic area on       Patient informed that his/her managed care company has contracts with or will negotiate with certain facilities, including the following:            Patient/family informed of bed offers received.  Patient chooses bed at       Physician recommends and patient chooses bed at      Patient to be transferred to   on  .  Patient to be transferred to facility by       Patient family notified on   of transfer.  Name of family member notified:        PHYSICIAN Please sign FL2     Additional Comment:    _______________________________________________ Loralyn Freshwater, LCSW 08/14/2014, 3:05  PM

## 2014-08-14 NOTE — Care Management Note (Signed)
Case Management Note  Patient Details  Name: [name] MRN: [id number] Date of Birth: 10-20-1922  Subjective/Objective:                  Patient admitted unders MEDICARE OBSERVATION which will not cover SNF but it will cover home health if needed. Routine Rx will not be covered either; PRN and new Rx will. Met with patient to discuss discharge planning. She states she lives with her daughter [name] which is retired from work. Patient states she has two walkers: Standard and front-wheeled walker. She states she uses both. She states her daughter cooks for her but patient is able to bathe herself. Her P[facility] is [facility] [address] (Dr. Nicki Reaper). She states she uses [facility] [address] for Rx. Phone rang while I was assessing discharge needs and it was her son.  Action/Plan: RNCM will continue to follow.   RNCM will continue to follow.   Expected Discharge Date:                  Expected Discharge Plan:     In-House Referral:     Discharge planning Services  CM Consult  Post Acute Care Choice:    Choice offered to:  Patient  DME Arranged:    DME Agency:     HH Arranged:    [place] Agency:     Status of Service:     Medicare Important Message Given:    Date Medicare IM Given:    Medicare IM give by:    Date Additional Medicare IM Given:    Additional Medicare Important Message give by:     If discussed at [place] of Stay Meetings, dates discussed:    Additional Comments:  Marshell Garfinkel, RN 08/14/2014, 8:21 AM

## 2014-08-15 LAB — GLUCOSE, CAPILLARY
Glucose-Capillary: 135 mg/dL — ABNORMAL HIGH (ref 65–99)
Glucose-Capillary: 195 mg/dL — ABNORMAL HIGH (ref 65–99)
Glucose-Capillary: 181 mg/dL — ABNORMAL HIGH (ref 65–99)
Glucose-Capillary: 177 mg/dL — ABNORMAL HIGH (ref 65–99)

## 2014-08-15 MED ORDER — AMPICILLIN-SULBACTAM SODIUM 1.5 (1-0.5) G IJ SOLR
1.5000 g | Freq: Four times a day (QID) | INTRAMUSCULAR | Status: DC
Start: 2014-08-15 — End: 2014-08-17
  Administered 2014-08-15 – 2014-08-17 (×8): 1.5 g via INTRAVENOUS
  Filled 2014-08-15 (×13): qty 1.5

## 2014-08-15 NOTE — Progress Notes (Signed)
[place] [facility] [place] NAME: [name]    MR#:  [id number]  DATE OF BIRTH:  07/06/1922  SUBJECTIVE:  CHIEF COMPLAINT:   Chief Complaint  Patient presents with  . Leg Swelling    left leg   still have some redness and pain on leg.  REVIEW OF SYSTEMS:  CONSTITUTIONAL: No fever, fatigue or weakness.  EYES: No blurred or double vision.  EARS, NOSE, AND THROAT: No tinnitus or ear pain.  RESPIRATORY: No cough, shortness of breath, wheezing or hemoptysis.  CARDIOVASCULAR: No chest pain, orthopnea, edema.  GASTROINTESTINAL: No nausea, vomiting, diarrhea or abdominal pain.  GENITOURINARY: No dysuria, hematuria.  ENDOCRINE: No polyuria, nocturia,  HEMATOLOGY: No anemia, easy bruising or bleeding SKIN: No rash or lesion. Redness on left leg. MUSCULOSKELETAL: No joint pain or arthritis.   NEUROLOGIC: No tingling, numbness, weakness.  PSYCHIATRY: No anxiety or depression.   ROS  DRUG ALLERGIES:   Allergies  Allergen Reactions  . Erythromycin Ethylsuccinate Other (See Comments)    REACTION:  unknown  . Mycinette [Phenol] Other (See Comments)    REACTION:  unknown    VITALS:  Blood pressure 139/56, pulse 102, temperature 98 F (36.7 C), temperature source Oral, resp. rate 16, height 5\' 7"  (1.702 m), weight 66.225 kg (146 lb), SpO2 96 %.  PHYSICAL EXAMINATION:  GENERAL:  81 y.o.-year-old patient lying in the bed with no acute distress.  EYES: Pupils equal, round, reactive to light and accommodation. No scleral icterus. Extraocular muscles intact.  HEENT: Head atraumatic, normocephalic. Oropharynx and nasopharynx clear.  NECK:  Supple, no jugular venous distention. No thyroid enlargement, no tenderness.  LUNGS: Normal breath sounds bilaterally, no wheezing, rales,rhonchi or crepitation. No use of accessory muscles of respiration.  CARDIOVASCULAR: S1, S2 normal. No murmurs, rubs, or gallops.  ABDOMEN: Soft, nontender,  nondistended. Bowel sounds present. No organomegaly or mass.  EXTREMITIES: on left leg from ankle to knee- redness present. Minimal swelling. NEUROLOGIC: Cranial nerves II through XII are intact. Muscle strength 5/5 in all extremities. Sensation intact. Gait not checked.  PSYCHIATRIC: The patient is alert and oriented x 3.  SKIN: No obvious rash, lesion, or ulcer. Redness as above.  Physical Exam LABORATORY PANEL:   CBC  Recent Labs Lab 08/14/14 0521  WBC 18.9*  HGB 11.4*  HCT 35.1  PLT 193   ------------------------------------------------------------------------------------------------------------------  Chemistries   Recent Labs Lab 08/13/14 2054 08/14/14 0521  NA 131* 130*  K 3.9 3.8  CL 94* 95*  CO2 27 26  GLUCOSE 188* 143*  BUN 20 16  CREATININE 0.63 0.61  CALCIUM 9.6 9.0  AST 24  --   ALT 20  --   ALKPHOS 112  --   BILITOT 1.1  --    ------------------------------------------------------------------------------------------------------------------  Cardiac Enzymes No results for input(s): TROPONINI in the last 168 hours. ------------------------------------------------------------------------------------------------------------------  RADIOLOGY:  US Venous Img Lower Unilateral Left  08/13/2014   CLINICAL DATA:  Left lower extremity pain and swelling for 2 days.  EXAM: LEFT LOWER EXTREMITY VENOUS DOPPLER ULTRASOUND  TECHNIQUE: Gray-scale sonography with graded compression, as well as color Doppler and duplex ultrasound were performed to evaluate the lower extremity deep venous systems from the level of the common femoral vein and including the common femoral, femoral, profunda femoral, popliteal and calf veins including the posterior tibial, peroneal and gastrocnemius veins when visible. The superficial great saphenous vein was also interrogated. Spectral Doppler was utilized to evaluate flow at rest and with  distal augmentation maneuvers in the common femoral,  femoral and popliteal veins.  COMPARISON:  Bilateral lower extremity duplex 01/03/2014  FINDINGS: Contralateral Common Femoral Vein: Respiratory phasicity is normal and symmetric with the symptomatic side. No evidence of thrombus. Normal compressibility.  Common Femoral Vein: No evidence of thrombus. Normal compressibility, respiratory phasicity and response to augmentation.  Saphenofemoral Junction: No evidence of thrombus. Normal compressibility and flow on color Doppler imaging.  Profunda Femoral Vein: No evidence of thrombus. Normal compressibility and flow on color Doppler imaging.  Femoral Vein: No evidence of thrombus. Normal compressibility, respiratory phasicity and response to augmentation.  Popliteal Vein: No evidence of thrombus. Normal compressibility, respiratory phasicity and response to augmentation.  Calf Veins: No evidence of thrombus. Normal flow on color Doppler imaging. Technically limited evaluation, however blood flow seen.  Superficial Great Saphenous Vein: No evidence of thrombus. Normal compressibility and flow on color Doppler imaging.  Venous Reflux:  None.  Other Findings:  None.  IMPRESSION: No evidence of deep venous thrombosis.   Electronically Signed   By: Jeb Levering M.D.   On: 08/13/2014 18:41    ASSESSMENT AND PLAN:   * Cellulitis of left leg -    No DVT- On IV vanc- added Rocephin.   Bl cx sent.   As per pt- swelling is much better now.   Change to IV unasyn today- one more day of IV Abx.  *  Atrial fibrillation - chronic problem, continue home rate controlling medicine  * Essential hypertension, benign - chronic stable problem, continue home medications *  Diabetes, type II - carb modified diet, before meals at bedtime glucose checks, patient does not seem to be on any anti-glycemic medications at home, controlling her diabetes with diet alone.  * CAD (coronary artery disease) - continue home medications  Per PT need rehab- likely tomorrow.  All the  records are reviewed and case discussed with Care Management/Social Workerr. Management plans discussed with the patient, family and they are in agreement.  CODE STATUS: Full  TOTAL TIME TAKING CARE OF THIS PATIENT: 35 minutes.   More than 50% of the visit was spent in counseling/coordination of care  POSSIBLE D/C IN 1-2 DAYS, DEPENDING ON CLINICAL CONDITION.   Vaughan Basta M.D on 08/15/2014   Between 7am to 6pm - Pager - [phone]  After 6pm go to [email or web address] - password [email or web address] [facility]  Office  [phone]  CC: Primary care physician; Einar Pheasant, MD

## 2014-08-15 NOTE — Progress Notes (Signed)
Physical Therapy Treatment Patient Details Name: [name] MRN: [id number] DOB: [birth year] Today's Date: 08/15/2014    History of Present Illness Patient is a 81 y/o female that presents with cellulitis of her LLE x2 days, which has become very painful for her. She has been ruled out for DVT at this time and is on IV antibiotics. Patient has a history of CVA with visual deficits.     PT Comments    Patient making improvements with gait tolerance, weight bearing acceptance on LLE, and step lengths. Patient is still significantly limited by pain in her LLE from ambulating further distances, though she tolerates 12 feet today. Patient does not display any marked balance deficits today, and is more alert and oriented than yesterday. Skilled acute PT services are indicated to continue to address her mobility deficits.   Follow Up Recommendations  SNF     Equipment Recommendations       Recommendations for Other Services       Precautions / Restrictions Precautions Precautions: Fall Restrictions Weight Bearing Restrictions: No    Mobility  Bed Mobility Overal bed mobility: Independent Bed Mobility: Supine to Sit              Transfers Overall transfer level: Needs assistance Equipment used: Rolling walker (2 wheeled) Transfers: Sit to/from Stand Sit to Stand: Min assist         General transfer comment: Patient able to tolerate more weight through LLE, though she is still weak and unable to perform sit to stand from chair independently with RW at this time.   Ambulation/Gait Ambulation/Gait assistance: Min guard Ambulation Distance (Feet): 12 Feet Assistive device: Rolling walker (2 wheeled) Gait Pattern/deviations: Decreased step length - right;Decreased step length - left;Decreased stance time - left     General Gait Details: Patient is able to take step through gait pattern with longer steps today, though she is very reluctant to weight bear on her  LLE.   Stairs            Wheelchair Mobility    Modified Rankin (Stroke Patients Only)       Balance Overall balance assessment: Needs assistance Sitting-balance support: Feet supported Sitting balance-Leahy Scale: Good     Standing balance support: Bilateral upper extremity supported Standing balance-Leahy Scale: Fair Standing balance comment: No loss of balance once in standing with ambulation.                     Cognition Arousal/Alertness: Awake/alert Behavior During Therapy: WFL for tasks assessed/performed Overall Cognitive Status: Within Functional Limits for tasks assessed                      Exercises General Exercises - Lower Extremity Long Arc Quad: AROM;10 reps Hip Flexion/Marching: AROM;5 reps;Both    General Comments        Pertinent Vitals/Pain Pain Assessment:  (Patient reports her LLE hurts, but not as bad as yesterday.) Pain Intervention(s): Limited activity within patient's tolerance;Monitored during session    Home Living                      Prior Function            PT Goals (current goals can now be found in the care plan section) Acute Rehab PT Goals Patient Stated Goal: To walk without pain as soon as possible.  PT Goal Formulation: With patient Time For Goal Achievement: 08/28/14 Potential to Achieve  Goals: Good Progress towards PT goals: Progressing toward goals    Frequency  Min 2X/week    PT Plan Current plan remains appropriate    Co-evaluation             End of Session Equipment Utilized During Treatment: Gait belt Activity Tolerance: Patient tolerated treatment well;No increased pain Patient left: in chair;with call bell/phone within reach;with chair alarm set     Time: 0347-4259 PT Time Calculation (min) (ACUTE ONLY): 17 min  Charges:  $Gait Training: 8-22 mins                    G Codes:      Kerman Passey, PT, DPT    08/15/2014, 5:03 PM

## 2014-08-15 NOTE — Progress Notes (Signed)
Clinical Social Worker (CSW) attempted to meet contact patient's daughter [name] to get SNF choice. Rebecca's voicemail box was full and CSW could not leave a message. Daughter was not at bedside this afternoon. CSW will continue to follow and assist as needed.   Blima Rich, [place] [phone]

## 2014-08-15 NOTE — Progress Notes (Signed)
Clinical Social Worker (CSW) presented bed offers to patient and her daughter [name] was at bedside. [name] is interested in [facility]. [facility] liaison will meet with patient and daughter to address questions about [facility]. Per daughter she would like to discuss offers with her brother. CSW will follow up tomorrow for bed choice. CSW will continue to follow and assist as needed.   Blima Rich, [place] [phone]

## 2014-08-16 LAB — CBC
HCT: 34.6 % — ABNORMAL LOW (ref 35.0–47.0)
HEMOGLOBIN: 11.2 g/dL — AB (ref 12.0–16.0)
MCH: 27.5 pg (ref 26.0–34.0)
MCHC: 32.5 g/dL (ref 32.0–36.0)
MCV: 84.8 fL (ref 80.0–100.0)
Platelets: 177 10*3/uL (ref 150–440)
RBC: 4.08 MIL/uL (ref 3.80–5.20)
RDW: 13.5 % (ref 11.5–14.5)
WBC: 17.3 10*3/uL — AB (ref 3.6–11.0)

## 2014-08-16 LAB — BASIC METABOLIC PANEL
Anion gap: 10 (ref 5–15)
BUN: 18 mg/dL (ref 6–20)
CO2: 27 mmol/L (ref 22–32)
CREATININE: 0.53 mg/dL (ref 0.44–1.00)
Calcium: 8.5 mg/dL — ABNORMAL LOW (ref 8.9–10.3)
Chloride: 89 mmol/L — ABNORMAL LOW (ref 101–111)
Glucose, Bld: 168 mg/dL — ABNORMAL HIGH (ref 65–99)
POTASSIUM: 3.5 mmol/L (ref 3.5–5.1)
Sodium: 126 mmol/L — ABNORMAL LOW (ref 135–145)

## 2014-08-16 LAB — GLUCOSE, CAPILLARY
GLUCOSE-CAPILLARY: 200 mg/dL — AB (ref 65–99)
Glucose-Capillary: 149 mg/dL — ABNORMAL HIGH (ref 65–99)
Glucose-Capillary: 146 mg/dL — ABNORMAL HIGH (ref 65–99)
Glucose-Capillary: 128 mg/dL — ABNORMAL HIGH (ref 65–99)

## 2014-08-16 MED ORDER — SODIUM CHLORIDE 1 G PO TABS
2.0000 g | ORAL_TABLET | Freq: Three times a day (TID) | ORAL | Status: DC
  Administered 2014-08-16 – 2014-08-17 (×2): 2 g via ORAL
  Filled 2014-08-16 (×7): qty 2

## 2014-08-16 MED ORDER — METOPROLOL TARTRATE 50 MG PO TABS
50.0000 mg | ORAL_TABLET | Freq: Two times a day (BID) | ORAL | Status: DC
  Administered 2014-08-16 – 2014-08-17 (×2): 50 mg via ORAL
  Filled 2014-08-16 (×2): qty 1

## 2014-08-16 MED ORDER — PANTOPRAZOLE SODIUM 40 MG PO TBEC
40.0000 mg | DELAYED_RELEASE_TABLET | Freq: Every day | ORAL | Status: DC
  Administered 2014-08-16 – 2014-08-17 (×2): 40 mg via ORAL
  Filled 2014-08-16 (×2): qty 1

## 2014-08-16 NOTE — Progress Notes (Signed)
[place] at [place] NAME: [name]    MR#:  [id number]  DATE OF BIRTH:  1922/07/18  SUBJECTIVE:  CHIEF COMPLAINT:   Chief Complaint  Patient presents with  . Leg Swelling    left leg  better swelling, redness and pain on left leg.  REVIEW OF SYSTEMS:  CONSTITUTIONAL: No fever, fatigue or weakness.  EYES: No blurred or double vision.  EARS, NOSE, AND THROAT: No tinnitus or ear pain.  RESPIRATORY: No cough, shortness of breath, wheezing or hemoptysis.  CARDIOVASCULAR: No chest pain, orthopnea, edema.  GASTROINTESTINAL: No nausea, vomiting, diarrhea or abdominal pain.  GENITOURINARY: No dysuria, hematuria.  ENDOCRINE: No polyuria, nocturia,  HEMATOLOGY: No anemia, easy bruising or bleeding SKIN: No rash or lesion. Redness on left leg. MUSCULOSKELETAL: No joint pain or arthritis.   NEUROLOGIC: No tingling, numbness, weakness.  PSYCHIATRY: No anxiety or depression.   ROS  DRUG ALLERGIES:   Allergies  Allergen Reactions  . Erythromycin Ethylsuccinate Other (See Comments)    REACTION:  unknown  . Mycinette [Phenol] Other (See Comments)    REACTION:  unknown    VITALS:  Blood pressure 147/77, pulse 89, temperature 97.6 F (36.4 C), temperature source Oral, resp. rate 18, height 5\' 7"  (1.702 m), weight 66.225 kg (146 lb), SpO2 100 %.  PHYSICAL EXAMINATION:  GENERAL:  81 y.o.-year-old patient lying in the bed with no acute distress.  EYES: Pupils equal, round, reactive to light and accommodation. No scleral icterus. Extraocular muscles intact.  HEENT: Head atraumatic, normocephalic. Oropharynx and nasopharynx clear.  NECK:  Supple, no jugular venous distention. No thyroid enlargement, no tenderness.  LUNGS: Normal breath sounds bilaterally, no wheezing, rales,rhonchi or crepitation. No use of accessory muscles of respiration.  CARDIOVASCULAR: S1, S2 normal. No murmurs, rubs, or gallops.  ABDOMEN: Soft, nontender,  nondistended. Bowel sounds present. No organomegaly or mass.  EXTREMITIES: on left leg from ankle to knee- redness present. Minimal swelling. Mild tenderness.  NEUROLOGIC: Cranial nerves II through XII are intact. Muscle strength 5/5 in all extremities. Sensation intact. Gait not checked.  PSYCHIATRIC: The patient is alert and oriented x 3.  SKIN: No obvious rash, lesion, or ulcer. Redness as above.  Physical Exam LABORATORY PANEL:   CBC  Recent Labs Lab 08/16/14 0924  WBC 17.3*  HGB 11.2*  HCT 34.6*  PLT 177   ------------------------------------------------------------------------------------------------------------------  Chemistries   Recent Labs Lab 08/13/14 2054  08/16/14 0924  NA 131*  < > 126*  K 3.9  < > 3.5  CL 94*  < > 89*  CO2 27  < > 27  GLUCOSE 188*  < > 168*  BUN 20  < > 18  CREATININE 0.63  < > 0.53  CALCIUM 9.6  < > 8.5*  AST 24  --   --   ALT 20  --   --   ALKPHOS 112  --   --   BILITOT 1.1  --   --   < > = values in this interval not displayed. ------------------------------------------------------------------------------------------------------------------  Cardiac Enzymes No results for input(s): TROPONINI in the last 168 hours. ------------------------------------------------------------------------------------------------------------------  RADIOLOGY:  No results found.  ASSESSMENT AND PLAN:   * Cellulitis of left leg - still has Leukocytosis.   No DVT- was onn IV vanc and added Rocephin. Changed to IV unasyn yesterday. F/u CBC.   *  Atrial fibrillation -rate controlled, on eliquis and lopressor.  * Essential hypertension, benign - chronic stable problem,  continue home medications *  Diabetes, type II - carb modified diet, before meals at bedtime glucose checks, patient does not seem to be on any anti-glycemic medications at home, controlling her diabetes with diet alone.  * CAD (coronary artery disease) - continue home  medications  * Hyponatremia. F/u BMP.   All the records are reviewed and case discussed with Care Management/Social Workerr. Management plans discussed with the patient, family and they are in agreement.  CODE STATUS: Full  TOTAL TIME TAKING CARE OF THIS PATIENT: 35 minutes.   More than 50% of the visit was spent in counseling/coordination of care  POSSIBLE D/C TO SNF IN 1-2 DAYS, DEPENDING ON CLINICAL CONDITION.   Demetrios Loll M.D on 08/16/2014   Between 7am to 6pm - Pager - [phone]  After 6pm go to [email or web address] - password [email or web address] [facility]  Office  [phone]  CC: Primary care physician; Einar Pheasant, MD

## 2014-08-16 NOTE — Progress Notes (Signed)
Inpatient Diabetes Program Recommendations  AACE/ADA: New Consensus Statement on Inpatient Glycemic Control (2013)  Target Ranges:  Prepandial:   less than 140 mg/dL      [facility] postprandial:   less than 180 mg/dL (1-2 hours)      Critically ill patients:  140 - 180 mg/dL   Results for [name], [name] (MRN [id number]) as of 08/16/2014 13:19  Ref. Range 08/15/2014 11:10 08/15/2014 16:05 08/15/2014 21:12 08/16/2014 07:54 08/16/2014 11:32  Glucose-Capillary Latest Ref Range: 65-99 mg/dL 195 (H) 181 (H) 177 (H) 149 (H) 200 (H)    Reason for assessment: elevated CBG  Diabetes history: Type 2 Outpatient Diabetes medications: none Current orders for Inpatient glycemic control: none  May want to consider adding Novolog correction 0-9 units tid while inpatient and at [facility].    Gentry Fitz, RN, BA, MHA, CDE Diabetes Coordinator Inpatient Diabetes Program  [phone] (Team Pager) [phone] ([place]) 08/16/2014 1:23 PM

## 2014-08-16 NOTE — Progress Notes (Signed)
Clinical Education officer, museum (CSW) received call from patient's daughter [name]. Per daughter they are undecided about SNF. Daughter prefers to take patient home with [place]. Daughter reported that she would meet with patient this afternoon and discuss D/C plan. CSW made RN Case Manager aware of above. CSW will continue to follow and assist as needed.   Blima Rich, [place] [phone]

## 2014-08-16 NOTE — Progress Notes (Signed)
Clinical Social Worker (CSW) met with patient to get SNF choice. Patient's daughter [name] was not at bedside. Patient reported that her daughter now does not want her to go to rehab. CSW left a voicemail for daughter on home phone. CSW was unable to leave a voicemail on daughter's cell phone because it was full. CSW asked RN to notify CSW when patient's daughter arrives at hospital. Per MD patient is not medically stable for D/C today. CSW will continue to follow and assist as needed.   Blima Rich, [place] [phone]

## 2014-08-16 NOTE — Progress Notes (Addendum)
Clinical Social Worker (CSW) met with patient and her daughter [name] was at bedside. They chose [facility]. CSW left voicemail with [facility] admissions coordinator at [facility] and made her aware of above. CSW will continue to follow and assist as needed.   Blima Rich, [place] [phone]

## 2014-08-16 NOTE — Care Management (Signed)
Notified by CSW that patient is planning to go home. She would like to use [facility]. I have notified Tiffany with [facility].

## 2014-08-17 LAB — BASIC METABOLIC PANEL
Anion gap: 8 (ref 5–15)
BUN: 18 mg/dL (ref 6–20)
CALCIUM: 8.6 mg/dL — AB (ref 8.9–10.3)
CHLORIDE: 90 mmol/L — AB (ref 101–111)
CO2: 30 mmol/L (ref 22–32)
Creatinine, Ser: 0.51 mg/dL (ref 0.44–1.00)
GFR calc Af Amer: >60 mL/min (ref >60)
GLUCOSE: 138 mg/dL — AB (ref 65–99)
Potassium: 3.6 mmol/L (ref 3.5–5.1)
SODIUM: 128 mmol/L — AB (ref 135–145)

## 2014-08-17 LAB — CBC
HEMATOCRIT: 32.9 % — AB (ref 35.0–47.0)
HEMOGLOBIN: 10.7 g/dL — AB (ref 12.0–16.0)
MCH: 27.4 pg (ref 26.0–34.0)
MCHC: 32.4 g/dL (ref 32.0–36.0)
MCV: 84.6 fL (ref 80.0–100.0)
Platelets: 191 10*3/uL (ref 150–440)
RBC: 3.89 MIL/uL (ref 3.80–5.20)
RDW: 13.7 % (ref 11.5–14.5)
WBC: 12.1 10*3/uL — ABNORMAL HIGH (ref 3.6–11.0)

## 2014-08-17 LAB — GLUCOSE, CAPILLARY: GLUCOSE-CAPILLARY: 120 mg/dL — AB (ref 65–99)

## 2014-08-17 LAB — MAGNESIUM: Magnesium: 1.7 mg/dL (ref 1.7–2.4)

## 2014-08-17 MED ORDER — MAGNESIUM OXIDE 400 (241.3 MG) MG PO TABS: 800.0000 mg | ORAL_TABLET | Freq: Once | ORAL | Status: DC

## 2014-08-17 MED ORDER — MAGNESIUM OXIDE 400 (241.3 MG) MG PO TABS
800.0000 mg | ORAL_TABLET | Freq: Once | ORAL | Status: AC
  Administered 2014-08-17: 800 mg via ORAL
  Filled 2014-08-17: qty 2

## 2014-08-17 MED ORDER — AMOXICILLIN-POT CLAVULANATE 875-125 MG PO TABS: 1.0000 | ORAL_TABLET | Freq: Two times a day (BID) | ORAL | Status: DC

## 2014-08-17 MED ORDER — TRAMADOL HCL 50 MG PO TABS: 50.0000 mg | ORAL_TABLET | Freq: Four times a day (QID) | ORAL | Status: DC | PRN

## 2014-08-17 NOTE — Care Management (Signed)
Important Message  Patient Details  Name: KENI WAFER MRN: [id number] Date of Birth: 1922/03/09   Medicare Important Message Given:  Yes-second notification given    Juliann Pulse A [place] 08/17/2014, 11:17 AM

## 2014-08-17 NOTE — Progress Notes (Signed)
Discharge Note:  Patient discharged to [facility]. Report called to [place] at facility. EMS called. Waiting on transportation from EMS. IV removed. Patient changed into clothe gown.

## 2014-08-17 NOTE — Clinical Social Work Placement (Signed)
   CLINICAL SOCIAL WORK PLACEMENT  NOTE  Date:  08/17/2014  Patient Details  Name: [name] MRN: [id number] Date of Birth: 1923/01/22  Clinical Social Work is seeking post-discharge placement for this patient at the [place] level of care (*CSW will initial, date and re-position this form in  chart as items are completed):  Yes   Patient/family provided with [place] Work Department's list of facilities offering this level of care within the geographic area requested by the patient (or if unable, by the patient's family).  Yes   Patient/family informed of their freedom to choose among providers that offer the needed level of care, that participate in Medicare, Medicaid or managed care program needed by the patient, have an available bed and are willing to accept the patient.  Yes   Patient/family informed of 's ownership interest in [facility] and [facility], as well as of the fact that they are under no obligation to receive care at these facilities.  PASRR submitted to EDS on       PASRR number received on       Existing PASRR number confirmed on 08/14/14     FL2 transmitted to all facilities in geographic area requested by pt/family on 08/14/14     FL2 transmitted to all facilities within larger geographic area on       Patient informed that his/her managed care company has contracts with or will negotiate with certain facilities, including the following:        Yes   Patient/family informed of bed offers received.  Patient chooses bed at  [facility] )     Physician recommends and patient chooses bed at      Patient to be transferred to  [facility] ) on 08/17/14.  Patient to be transferred to facility by  [facility] )     Patient family notified on 08/17/14 of transfer.  Name of family member notified:   (Daughter [name] was left a voicemail )     PHYSICIAN Please sign FL2      Additional Comment:    _______________________________________________ Loralyn Freshwater, LCSW 08/17/2014, 10:11 AM

## 2014-08-17 NOTE — Progress Notes (Signed)
Patient is medically stable for D/C to [facility] today. Per Tiffany admissions coordinator at [facility] patient is going to room 306. RN will call report to [facility] and arrange EMS for transport. Clinical Education officer, museum (CSW) prepared D/C packet and sent D/C Summary and follow up appointment to [place] via carefinder. Patient is aware of above. CSW left voicemail for patient's daughter [name] and made her aware of above. Please reconsult if future social work needs arise. CSW signing off.   Blima Rich, [place] [phone]

## 2014-08-17 NOTE — Discharge Instructions (Signed)
Low sodium and ADA diet. Activity as tolerated.

## 2014-08-17 NOTE — Discharge Summary (Signed)
[facility] [place] NAME: [name]    MR#:  [id number]  DATE OF BIRTH:  [birth year]  DATE OF ADMISSION:  08/13/2014 ADMITTING PHYSICIAN: Lance Coon, MD  DATE OF DISCHARGE: 08/17/2014 PRIMARY CARE PHYSICIAN: Einar Pheasant, MD    ADMISSION DIAGNOSIS:  Cellulitis of left lower extremity [P59.163]   DISCHARGE DIAGNOSIS:  Cellulitis of left lower extremity [L03.116]  Hyponatremia SECONDARY DIAGNOSIS:   Past Medical History  Diagnosis Date  . Type II or unspecified type diabetes mellitus without mention of complication, not stated as uncontrolled   . Essential hypertension, benign   . Anemia, unspecified   . Atrial fibrillation   . Hypercholesterolemia   . Osteoarthritis   . History of TIAs   . CVA (cerebral infarction)   . Atrial fibrillation   . Uterine cancer     s/p TAH/BSO  . CAD (coronary artery disease)     s/p CABG 10/99 with LIMA to the LAD, SVG to OM1, SVG to distal RCA    HOSPITAL COURSE:   * Cellulitis of left leg:   Duplex showed no DVT. The patient was on IV vanc and added Rocephin, then changed to IV unasyn. Cellulitis is better.  Leukocytosis improved. The patient need augmentin po bid for 10 days.  * Atrial fibrillation: rate controlled, on eliquis and lopressor.  * Essential hypertension, benign - chronic and stable. She has been treated home hypertension medication  * Diabetes, type II. Controlled * CAD (coronary artery disease) - continue home medications  * Hyponatremia. Better. Follow up BMP as outpatient.  DISCHARGE CONDITIONS:   Stable, discharge to SNF today.  CONSULTS OBTAINED:     DRUG ALLERGIES:   Allergies  Allergen Reactions  . Erythromycin Ethylsuccinate Other (See Comments)    REACTION:  unknown  . Mycinette [Phenol] Other (See Comments)    REACTION:  unknown    DISCHARGE MEDICATIONS:   Current Discharge Medication List    START taking these medications   Details  amoxicillin-clavulanate (AUGMENTIN) 875-125 MG per tablet Take 1 tablet by mouth 2 (two) times daily. Qty: 20 tablet, Refills: 0      CONTINUE these medications which have CHANGED   Details  traMADol (ULTRAM) 50 MG tablet Take 1 tablet (50 mg total) by mouth every 6 (six) hours as needed. Qty: 20 tablet, Refills: 0      CONTINUE these medications which have NOT CHANGED   Details  clotrimazole-betamethasone (LOTRISONE) cream Apply 1 application topically 2 (two) times daily. Qty: 30 g, Refills: 0    ELIQUIS 2.5 MG TABS tablet TAKE 1 TABLET BY MOUTH TWICE A DAY Qty: 60 tablet, Refills: 5    glucose blood test strip 1 each by Other route as needed for other. Use one test strip bid    hydrochlorothiazide (HYDRODIURIL) 25 MG tablet Take 1 tablet (25 mg total) by mouth daily. Qty: 90 tablet, Refills: 0    KLOR-CON M10 10 MEQ tablet TAKE 1 TABLET (10 MEQ TOTAL) BY MOUTH DAILY. Qty: 30 tablet, Refills: 5    lisinopril (PRINIVIL,ZESTRIL) 40 MG tablet Take 1 tablet (40 mg total) by mouth every morning. Qty: 90 tablet, Refills: 3    metoprolol tartrate (LOPRESSOR) 25 MG tablet Take 1 tablet (25 mg total) by mouth 2 (two) times daily. Qty: 180 tablet, Refills: 3    nystatin cream (MYCOSTATIN) Apply 1 application topically 2 (two) times daily. Qty: 30 g, Refills: 0    senna-docusate (SENOKOT-S) 8.6-50 MG  per tablet Take 1 tablet by mouth 2 (two) times daily. Qty: 60 tablet, Refills: 3    sertraline (ZOLOFT) 50 MG tablet Take 1 tablet (50 mg total) by mouth daily. Qty: 90 tablet, Refills: 0         DISCHARGE INSTRUCTIONS:      If you experience worsening of your admission symptoms, develop shortness of breath, life threatening emergency, suicidal or homicidal thoughts you must seek medical attention immediately by calling 911 or calling your MD immediately  if symptoms less severe.  You Must read complete instructions/literature along with all the possible adverse  reactions/side effects for all the Medicines you take and that have been prescribed to you. Take any new Medicines after you have completely understood and accept all the possible adverse reactions/side effects.   Please note  You were cared for by a hospitalist during your hospital stay. If you have any questions about your discharge medications or the care you received while you were in the hospital after you are discharged, you can call the unit and asked to speak with the hospitalist on call if the hospitalist that took care of you is not available. Once you are discharged, your primary care physician will handle any further medical issues. Please note that NO REFILLS for any discharge medications will be authorized once you are discharged, as it is imperative that you return to your primary care physician (or establish a relationship with a primary care physician if you do not have one) for your aftercare needs so that they can reassess your need for medications and monitor your lab values.    Today   SUBJECTIVE    No complaint.  VITAL SIGNS:  Blood pressure 144/94, pulse 99, temperature 98 F (36.7 C), temperature source Oral, resp. rate 18, height 5\' 7"  (1.702 m), weight 66.225 kg (146 lb), SpO2 95 %.  I/O:   Intake/Output Summary (Last 24 hours) at 08/17/14 0938 Last data filed at 08/17/14 0900  Gross per 24 hour  Intake    340 ml  Output      0 ml  Net    340 ml    PHYSICAL EXAMINATION:  GENERAL:  81 y.o.-year-old patient lying in the bed with no acute distress.  EYES: Pupils equal, round, reactive to light and accommodation. No scleral icterus. Extraocular muscles intact.  HEENT: Head atraumatic, normocephalic. Oropharynx and nasopharynx clear.  NECK:  Supple, no jugular venous distention. No thyroid enlargement, no tenderness.  LUNGS: Normal breath sounds bilaterally, no wheezing, rales,rhonchi or crepitation. No use of accessory muscles of respiration.  CARDIOVASCULAR:  S1, S2 normal. No murmurs, rubs, or gallops.  ABDOMEN: Soft, non-tender, non-distended. Bowel sounds present. No organomegaly or mass.  EXTREMITIES: No pedal edema, cyanosis, or clubbing. Left leg swelling and erythema is better, mild tenderness. NEUROLOGIC: Cranial nerves II through XII are intact. Muscle strength 4/5 in all extremities. Sensation intact. Gait not checked.  PSYCHIATRIC: The patient is alert and oriented x 3.  SKIN: No obvious rash, lesion, or ulcer.   DATA REVIEW:   CBC  Recent Labs Lab 08/17/14 0544  WBC 12.1*  HGB 10.7*  HCT 32.9*  PLT 191    Chemistries   Recent Labs Lab 08/13/14 2054  08/17/14 0544  NA 131*  < > 128*  K 3.9  < > 3.6  CL 94*  < > 90*  CO2 27  < > 30  GLUCOSE 188*  < > 138*  BUN 20  < > 18  CREATININE 0.63  < > 0.51  CALCIUM 9.6  < > 8.6*  MG  --   --  1.7  AST 24  --   --   ALT 20  --   --   ALKPHOS 112  --   --   BILITOT 1.1  --   --   < > = values in this interval not displayed.  Cardiac Enzymes No results for input(s): TROPONINI in the last 168 hours.  Microbiology Results  Results for orders placed or performed during the hospital encounter of 08/13/14  Blood culture (routine x 2)     Status: None (Preliminary result)   Collection Time: 08/13/14  8:54 PM  Result Value Ref Range Status   Specimen Description BLOOD  Final   Special Requests Normal  Final   Culture NO GROWTH 2 DAYS  Final   Report Status PENDING  Incomplete  Blood culture (routine x 2)     Status: None (Preliminary result)   Collection Time: 08/13/14  9:17 PM  Result Value Ref Range Status   Specimen Description BLOOD  Final   Special Requests Normal  Final   Culture NO GROWTH 2 DAYS  Final   Report Status PENDING  Incomplete    RADIOLOGY:  No results found.      Management plans discussed with the patient, family and they are in agreement.  CODE STATUS:     Code Status Orders        Start     Ordered   08/13/14 2324  Full code    Continuous     08/13/14 2323    Advance Directive Documentation        Most Recent Value   Type of Advance Directive  Healthcare Power of Attorney   Pre-existing out of facility DNR order (yellow form or pink MOST form)     "MOST" Form in Place?        TOTAL TIME TAKING CARE OF THIS PATIENT: 36 minutes.    Demetrios Loll M.D on 08/17/2014 at 9:38 AM  Between 7am to 6pm - Pager - [phone]  After 6pm go to [email or web address] - password [email or web address] [facility]  Office  [phone]  CC: Primary care physician; Einar Pheasant, MD

## 2014-08-18 ENCOUNTER — Telehealth: Payer: Self-pay | Admitting: Internal Medicine

## 2014-08-18 LAB — CULTURE, BLOOD (ROUTINE X 2)
CULTURE: NO GROWTH
Culture: NO GROWTH
Special Requests: NORMAL
Special Requests: NORMAL

## 2014-08-18 NOTE — Telephone Encounter (Signed)
Per note that I received, pt was discharged to skilled nursing facility.  Please contact her daughter Jacqlyn Larsen) and confirm pt at skilled nursing and to let us know when discharged - to f/u here.  Thanks

## 2014-08-21 NOTE — Telephone Encounter (Signed)
Left message for Jacqlyn Larsen, notifying to call office when pt discharged from SNF.

## 2014-08-23 ENCOUNTER — Ambulatory Visit: Payer: Medicare Other | Admitting: Nurse Practitioner

## 2014-09-01 ENCOUNTER — Other Ambulatory Visit
Admission: RE | Admit: 2014-09-01 | Discharge: 2014-09-01 | Disposition: A | Discharge status: Home / Self Care | Payer: Medicare Other | Source: Ambulatory Visit | Attending: Family Medicine | Admitting: Family Medicine

## 2014-09-01 DIAGNOSIS — Z029 Encounter for administrative examinations, unspecified: Secondary | ICD-10-CM | POA: Diagnosis present

## 2014-09-01 LAB — CBC WITH DIFFERENTIAL/PLATELET
Basophils Absolute: 0.1 10*3/uL (ref 0–0.1)
Basophils Relative: 1 %
EOS ABS: 0.2 10*3/uL (ref 0–0.7)
EOS PCT: 2 %
HCT: 34.5 % — ABNORMAL LOW (ref 35.0–47.0)
Hemoglobin: 11.3 g/dL — ABNORMAL LOW (ref 12.0–16.0)
LYMPHS ABS: 1.3 10*3/uL (ref 1.0–3.6)
LYMPHS PCT: 13 %
MCH: 27.8 pg (ref 26.0–34.0)
MCHC: 32.9 g/dL (ref 32.0–36.0)
MCV: 84.6 fL (ref 80.0–100.0)
MONO ABS: 1.1 10*3/uL — AB (ref 0.2–0.9)
Monocytes Relative: 11 %
Neutro Abs: 7.3 10*3/uL — ABNORMAL HIGH (ref 1.4–6.5)
Neutrophils Relative %: 73 %
PLATELETS: 238 10*3/uL (ref 150–440)
RBC: 4.08 MIL/uL (ref 3.80–5.20)
RDW: 14.2 % (ref 11.5–14.5)
WBC: 9.9 10*3/uL (ref 3.6–11.0)

## 2014-09-03 ENCOUNTER — Telehealth: Payer: Self-pay

## 2014-09-03 ENCOUNTER — Telehealth: Payer: Self-pay | Admitting: Internal Medicine

## 2014-09-03 NOTE — Telephone Encounter (Signed)
Pt granddaughter called to stated that the patient has infection in her leg. Granddaughter doesn't think that nursing home is taking care of her and the infection seem to be get worst. Please advise [email or web address]

## 2014-09-03 NOTE — Telephone Encounter (Signed)
The pt's daughter called and is hoping to speak with someone regarding the care her mother is receiving at the nursing home she is at.  She states she has not been getting PT and her wound is getting worse.

## 2014-09-03 NOTE — Telephone Encounter (Signed)
I have tried to reach granddaughter a few times today. Will try again in the morning

## 2014-09-03 NOTE — Telephone Encounter (Signed)
See other phone note for response. Daughter has called twice today.

## 2014-09-03 NOTE — Telephone Encounter (Signed)
She is in skilled nursing.  There should be a physician that comes to the nursing home.  They need to have the physician evaluate.  Let me know if any problems.

## 2014-09-04 ENCOUNTER — Emergency Department
Admission: EM | Admit: 2014-09-04 | Discharge: 2014-09-04 | Disposition: A | Discharge status: Home / Self Care | Payer: Medicare Other | Attending: Emergency Medicine | Admitting: Emergency Medicine

## 2014-09-04 DIAGNOSIS — L8990 Pressure ulcer of unspecified site, unspecified stage: Secondary | ICD-10-CM

## 2014-09-04 DIAGNOSIS — I10 Essential (primary) hypertension: Secondary | ICD-10-CM | POA: Diagnosis not present

## 2014-09-04 DIAGNOSIS — M79605 Pain in left leg: Secondary | ICD-10-CM | POA: Diagnosis present

## 2014-09-04 DIAGNOSIS — L89899 Pressure ulcer of other site, unspecified stage: Secondary | ICD-10-CM | POA: Insufficient documentation

## 2014-09-04 DIAGNOSIS — E119 Type 2 diabetes mellitus without complications: Secondary | ICD-10-CM | POA: Insufficient documentation

## 2014-09-04 NOTE — Discharge Instructions (Signed)
Skin Ulcer  A skin ulcer is an open sore that can be shallow or deep. Skin ulcers sometimes become infected and are difficult to treat. It may be 1 month or longer before real healing progress is made.  CAUSES    Injury.   Problems with the veins or arteries.   Diabetes.   Insect bites.   Bedsores.   Inflammatory conditions.  SYMPTOMS    Pain, redness, swelling, and tenderness around the ulcer.   Fever.   Bleeding from the ulcer.   Yellow or clear fluid coming from the ulcer.  DIAGNOSIS   There are many types of skin ulcers. Any open sores will be examined. Certain tests will be done to determine the kind of ulcer you have. The right treatment depends on the type of ulcer you have.  TREATMENT   Treatment is a long-term challenge. It may include:   Wearing an elastic wrap, compression stockings, or gel cast over the ulcer area.   Taking antibiotic medicines or putting antibiotic creams on the affected area if there is an infection.  HOME CARE INSTRUCTIONS   Put on your bandages (dressings), wraps, or casts over the ulcer as directed by your caregiver.   Change all dressings as directed by your caregiver.   Take all medicines as directed by your caregiver.   Keep the affected area clean and dry.   Avoid injuries to the affected area.   Eat a well-balanced, healthy diet that includes plenty of fruit and vegetables.   If you smoke, consider quitting or decreasing the amount of cigarettes you smoke.   Once the ulcer heals, get regular exercise as directed by your caregiver.   Work with your caregiver to make sure your blood pressure, cholesterol, and diabetes are well-controlled.   Keep your skin moisturized. Dry skin can crack and lead to skin ulcers.  SEEK IMMEDIATE MEDICAL CARE IF:    Your pain gets worse.   You have swelling, redness, or fluids around the ulcer.   You have chills.   You have a fever.  MAKE SURE YOU:    Understand these instructions.   Will watch your condition.   Will get  help right away if you are not doing well or get worse.  Document Released: 03/12/2004 Document Revised: 04/27/2011 Document Reviewed: 09/19/2010  ExitCare Patient Information 2015 [employer]. This information is not intended to replace advice given to you by your health care provider. Make sure you discuss any questions you have with your health care provider.

## 2014-09-04 NOTE — Telephone Encounter (Signed)
Daughter [name]) came into office today & spoke with Dr. Nicki Reaper. She also notified me that Ms. Noble is at [facility].

## 2014-09-04 NOTE — ED Notes (Signed)
Foot wrapped with gelocast and ace wrap. Patient tolerated well. Social worker here to talk with family about patient returning to nursing home for further rehab.

## 2014-09-04 NOTE — Telephone Encounter (Signed)
Discussed concerns with pts daughter.  We discussed possible options.  She prefers to notify [facility] of her concerns and request that pt be transferred back to the hospital.  She feels her wound, leg, etc is getting worse despite outpatient antibiotics and feels she needs something more done.  We also discussed the wound clinic as an option.  Will let me know if any other problems.

## 2014-09-04 NOTE — ED Provider Notes (Signed)
[facility] Emergency Department Provider Note     Time seen: ----------------------------------------- 1:55 PM on 09/04/2014 -----------------------------------------    I have reviewed the triage vital signs and the nursing notes.   HISTORY  Chief Complaint Cellulitis    HPI Estreya L Granada is a 81 y.o. female sent to the ER from [facility]. Family requests the patient, however left leg evaluated. Family refused care from [facility], states her leg has gotten significantly worse since she's been there. She was hospitalized and seen by me about a month ago for cellulitis and then subsequently was sent to a rehabilitation facility.   Past Medical History  Diagnosis Date  . Type II or unspecified type diabetes mellitus without mention of complication, not stated as uncontrolled   . Essential hypertension, benign   . Anemia, unspecified   . Atrial fibrillation   . Hypercholesterolemia   . Osteoarthritis   . History of TIAs   . CVA (cerebral infarction)   . Atrial fibrillation   . Uterine cancer     s/p TAH/BSO  . CAD (coronary artery disease)     s/p CABG 10/99 with LIMA to the LAD, SVG to OM1, SVG to distal RCA    Patient Active Problem List   Diagnosis Date Noted  . Cellulitis 08/14/2014  . Cellulitis of left leg 08/13/2014  . Rash 07/02/2014  . Edema 01/28/2014  . Toenail fungus 06/25/2013  . CVA (cerebral vascular accident) 04/02/2013  . ICH (intracerebral hemorrhage) on anticoagulation, after recent LPCA infarct 01/26/2013  . Neuropathy 12/18/2012  . Hyponatremia 06/19/2012  . Atrial fibrillation 06/17/2012  . Essential hypertension, benign 06/17/2012  . CAD (coronary artery disease) 06/17/2012  . Peripheral vascular disease 06/17/2012  . Diabetes 06/17/2012  . Hypercholesterolemia 06/17/2012  . Depression 06/17/2012    Past Surgical History  Procedure Laterality Date  . Total abdominal hysterectomy w/ bilateral  salpingoophorectomy  1969  . Coronary artery bypass graft  1999    x4  . Cataract extraction      Right eye    Allergies Erythromycin ethylsuccinate and Mycinette  Social History History  Substance Use Topics  . Smoking status: Never Smoker   . Smokeless tobacco: Never Used  . Alcohol Use: No    Review of Systems Constitutional: Negative for fever. Eyes: Negative for visual changes. ENT: Negative for sore throat. Cardiovascular: Negative for chest pain. Respiratory: Negative for shortness of breath. Gastrointestinal: Negative for abdominal pain, vomiting and diarrhea. Genitourinary: Negative for dysuria. Musculoskeletal: Negative for back pain. Positive for left leg pain Skin: Positive for left leg ulceration Neurological: Negative for headaches, focal weakness or numbness.  10-point ROS otherwise negative.  ____________________________________________   PHYSICAL EXAM:  VITAL SIGNS: ED Triage Vitals  Enc Vitals Group     BP --      Pulse --      Resp --      Temp --      Temp src --      SpO2 --      Weight --      Height --      Head Cir --      Peak Flow --      Pain Score --      Pain Loc --      Pain Edu? --      Excl. in [place]? --     Constitutional: Alert and oriented. Well appearing and in no distress. Eyes: Conjunctivae are normal. PERRL. Normal  extraocular movements. ENT   Head: Normocephalic and atraumatic.   Nose: No congestion/rhinnorhea.   Mouth/Throat: Mucous membranes are moist.   Neck: No stridor. Cardiovascular: Normal rate, regular rhythm. Normal and symmetric distal pulses are present in all extremities. No murmurs, rubs, or gallops. Respiratory: Normal respiratory effort without tachypnea nor retractions. Breath sounds are clear and equal bilaterally. No wheezes/rales/rhonchi. Gastrointestinal: Soft and nontender. No distention. No abdominal bruits. There is no CVA tenderness. Musculoskeletal: Nontender with normal range  of motion in all extremities. Left lower extremity ulceration Neurologic:  Normal speech and language. No gross focal neurologic deficits are appreciated. Speech is normal. No gait instability patient was able to ambulate with a walker Skin:  Multiple contusions on bilateral lower extremity is, there is a proximally 10 cm ulceration on the left lower extremity laterally superior to the lateral malleolus. There is good granulation tissue to this wound, appears to be healing well. No surrounding cellulitis. Psychiatric: Mood and affect are normal.  ____________________________________________  ED COURSE:  Pertinent labs & imaging results that were available during my care of the patient were reviewed by me and considered in my medical decision making (see chart for details). Patient with unremarkable arterial Dopplers performed in the room on both lower extremities. ____________________________________________   FINAL ASSESSMENT AND PLAN  Lower extremity ulceration  Plan: Patient with labs and imaging as dictated above. Wound will be redressed, her ABIs are within normal limits or as expected for 81 years of age. She is stable for outpatient follow-up with the wound healing Center.   Earleen Newport, MD   Earleen Newport, MD 09/04/14 [phone]

## 2014-09-04 NOTE — ED Notes (Signed)
Patient here from [facility]. Family requested that patient come and have left leg looked at. Family refused care from [facility].

## 2014-09-04 NOTE — ED Notes (Signed)
BLOOD PRESSURES: RIGHT ARM - 187/74 LEFT ARM- 165/90 RIGHT LEG - 148/90 LEFT LEG -  144/90

## 2014-09-07 ENCOUNTER — Encounter: Payer: No Typology Code available for payment source | Attending: Surgery | Admitting: Surgery

## 2014-09-07 ENCOUNTER — Other Ambulatory Visit
Admission: RE | Admit: 2014-09-07 | Discharge: 2014-09-07 | Disposition: A | Discharge status: Home / Self Care | Payer: No Typology Code available for payment source | Source: Ambulatory Visit | Attending: Surgery | Admitting: Surgery

## 2014-09-07 DIAGNOSIS — I4891 Unspecified atrial fibrillation: Secondary | ICD-10-CM | POA: Diagnosis not present

## 2014-09-07 DIAGNOSIS — L97222 Non-pressure chronic ulcer of left calf with fat layer exposed: Secondary | ICD-10-CM | POA: Diagnosis present

## 2014-09-07 DIAGNOSIS — I739 Peripheral vascular disease, unspecified: Secondary | ICD-10-CM | POA: Diagnosis not present

## 2014-09-07 DIAGNOSIS — I10 Essential (primary) hypertension: Secondary | ICD-10-CM | POA: Insufficient documentation

## 2014-09-07 DIAGNOSIS — I70248 Atherosclerosis of native arteries of left leg with ulceration of other part of lower left leg: Secondary | ICD-10-CM | POA: Insufficient documentation

## 2014-09-07 DIAGNOSIS — B999 Unspecified infectious disease: Secondary | ICD-10-CM | POA: Diagnosis present

## 2014-09-07 DIAGNOSIS — E119 Type 2 diabetes mellitus without complications: Secondary | ICD-10-CM | POA: Insufficient documentation

## 2014-09-07 DIAGNOSIS — L03116 Cellulitis of left lower limb: Secondary | ICD-10-CM | POA: Insufficient documentation

## 2014-09-07 DIAGNOSIS — I2510 Atherosclerotic heart disease of native coronary artery without angina pectoris: Secondary | ICD-10-CM | POA: Diagnosis not present

## 2014-09-07 NOTE — Progress Notes (Signed)
TELEAH, VILLAMAR ([id number]) Visit Report for 09/07/2014 Abuse/Suicide Risk Screen Details Patient Name: [name], [name] 09/07/2014 10:15 Date of Service: AM Medical Record [id number] Number: Patient Account Number: [id number] 25-Jan-1923 (81 y.o. Treating RN: Baruch Gouty, RN, BSN, Velva Harman Date of Birth/Sex: Female) Other Clinician: Primary Care Physician: Einar Pheasant Treating Britto, Errol Referring Physician: Physician/Extender: Suella Grove in Treatment: 0 Abuse/Suicide Risk Screen Items Answer ABUSE/SUICIDE RISK SCREEN: Has anyone close to you tried to hurt or harm you recentlyo No Do you feel uncomfortable with anyone in your familyo No Has anyone forced you do things that you didnot want to doo No Do you have any thoughts of harming yourselfo No Patient displays signs or symptoms of abuse and/or neglect. No Electronic Signature(s) Signed: 09/07/2014 2:16:09 PM By: Regan Lemming BSN, RN Entered By: Regan Lemming on 09/07/2014 10:35:33 [name] ([id number]) -------------------------------------------------------------------------------- Activities of Daily [name] Details Patient Name: [name], [name] 09/07/2014 10:15 Date of Service: AM Medical Record [id number] Number: Patient Account Number: [id number] Jan 24, 1923 (81 y.o. Treating RN: Baruch Gouty, RN, BSN, Velva Harman Date of Birth/Sex: Female) Other Clinician: Primary Care Physician: Einar Pheasant Treating Britto, Errol Referring Physician: Physician/Extender: Suella Grove in Treatment: 0 Activities of Daily [name] Items Answer Activities of Daily [name] (Please select one for each item) Drive Automobile Not Able Take Medications Need Assistance Use Telephone Need Assistance Care for Appearance Need Assistance Use Toilet Need Assistance Bath / Shower Need Assistance Dress Self Need Assistance Feed Self Need Assistance Walk Need Assistance Get In / Out Bed Need Assistance Housework Need Assistance Prepare Meals Need  Assistance Handle Money Need Assistance Shop for Self Need Assistance Electronic Signature(s) Signed: 09/07/2014 2:16:09 PM By: Regan Lemming BSN, RN Entered By: Regan Lemming on 09/07/2014 10:36:09 [name] ([id number]) -------------------------------------------------------------------------------- Education Assessment Details Patient Name: [name], [name] 09/07/2014 10:15 Date of Service: AM Medical Record [id number] Number: Patient Account Number: [id number] Mar 23, 1922 (81 y.o. Treating RN: Baruch Gouty, RN, BSN, Velva Harman Date of Birth/Sex: Female) Other Clinician: Primary Care Physician: Einar Pheasant Treating Britto, Errol Referring Physician: Physician/Extender: Suella Grove in Treatment: 0 Primary Learner Assessed: Patient Learning Preferences/Education Level/Primary Language Learning Preference: Explanation Highest Education Level: Grade School Preferred Language: English Cognitive Barrier Assessment/Beliefs Language Barrier: No Physical Barrier Assessment Impaired Vision: Yes Glasses, right eye blindness Impaired Hearing: No Knowledge/Comprehension Assessment Knowledge Level: Medium Comprehension Level: Medium Ability to understand written Medium instructions: Ability to understand verbal Medium instructions: Motivation Assessment Anxiety Level: Calm Cooperation: Cooperative Education Importance: Acknowledges Need Interest in Health Problems: Asks Questions Perception: Coherent Willingness to Engage in Self- Medium Management Activities: Readiness to Engage in Self- Medium Management Activities: Electronic Signature(s) Signed: 09/07/2014 2:16:09 PM By: Regan Lemming BSN, RN Entered By: Regan Lemming on 09/07/2014 10:37:28 [name] ([id number]) -------------------------------------------------------------------------------- Fall Risk Assessment Details Patient Name: [name] 09/07/2014 10:15 Date of Service: AM Medical  Record [id number] Number: Patient Account Number: [id number] [birth year] (81 y.o. Treating RN: Baruch Gouty, RN, BSN, Velva Harman Date of Birth/Sex: Female) Other Clinician: Primary Care Physician: Einar Pheasant Treating Britto, Errol Referring Physician: Physician/Extender: Suella Grove in Treatment: 0 Fall Risk Assessment Items FALL RISK ASSESSMENT: History of falling - immediate or within 3 months 0 No Secondary diagnosis 0 No Ambulatory aid None/bed rest/wheelchair/nurse 0 Yes Crutches/cane/walker 0 No Furniture 0 No IV Access/Saline Lock 0 No Gait/Training Normal/bed rest/immobile 0 No Weak 10 Yes Impaired 20 Yes Mental Status Oriented to own ability 0 Yes Electronic Signature(s) Signed: 09/07/2014 2:16:09 PM By: Regan Lemming BSN, RN Entered By: Regan Lemming on 09/07/2014 10:36:25 [name],  [name] ([id number]) -------------------------------------------------------------------------------- Foot Assessment Details Patient Name: [name], [name] 09/07/2014 10:15 Date of Service: AM Medical Record [id number] Number: Patient Account Number: [id number] [birth year] (81 y.o. Treating RN: Baruch Gouty, RN, BSN, Velva Harman Date of Birth/Sex: Female) Other Clinician: Primary Care Physician: Einar Pheasant Treating Britto, Errol Referring Physician: Physician/Extender: Suella Grove in Treatment: 0 Foot Assessment Items Site Locations + = Sensation present, - = Sensation absent, C = Callus, U = Ulcer R = Redness, W = Warmth, M = Maceration, PU = Pre-ulcerative lesion F = Fissure, S = Swelling, D = Dryness Assessment Right: Left: Other Deformity: No No Prior Foot Ulcer: No No Prior Amputation: No No Charcot Joint: No No Ambulatory Status: Ambulatory With Help Assistance Device: Walker Gait: Administrator, arts) Signed: 09/07/2014 2:16:09 PM By: Regan Lemming BSN, RN Entered By: Regan Lemming on 09/07/2014 10:37:00 [name] ([id number] Dresser, [name]  ([id number]) -------------------------------------------------------------------------------- Nutrition Risk Assessment Details Patient Name: [name], [name] 09/07/2014 10:15 Date of Service: AM Medical Record [id number] Number: Patient Account Number: [id number] [birth year] (81 y.o. Treating RN: Afful, RN, BSN, Velva Harman Date of Birth/Sex: Female) Other Clinician: Primary Care Physician: Einar Pheasant Treating Britto, Errol Referring Physician: Physician/Extender: Suella Grove in Treatment: 0 Height (in): 67 Weight (lbs): 159 Body Mass Index (BMI): 24.9 Nutrition Risk Assessment Items NUTRITION RISK SCREEN: I have an illness or condition that made me change the kind and/or 0 No amount of food I eat I eat fewer than two meals per day 0 No I eat few fruits and vegetables, or milk products 0 No I have three or more drinks of beer, liquor or wine almost every day 0 No I have tooth or mouth problems that make it hard for me to eat 0 No I don't always have enough money to buy the food I need 0 No I eat alone most of the time 0 No I take three or more different prescribed or over-the-counter drugs a 0 No day Without wanting to, I have lost or gained 10 pounds in the last six 0 No months I am not always physically able to shop, cook and/or feed myself 2 Yes Nutrition Protocols Good Risk Protocol Provide education on Moderate Risk Protocol 0 nutrition Electronic Signature(s) Signed: 09/07/2014 2:16:09 PM By: Regan Lemming BSN, RN Entered By: Regan Lemming on 09/07/2014 10:36:42

## 2014-09-07 NOTE — Progress Notes (Addendum)
[name] ([id number]) Visit Report for 09/07/2014 Chief Complaint Document Details Patient Name: [name] 09/07/2014 10:15 Date of Service: AM Medical Record [id number] Number: Patient Account Number: [id number] September 17, 1922 (81 y.o. Treating RN: Date of Birth/Sex: Female) Other Clinician: Primary Care Physician: Einar Pheasant Treating Deray Dawes Referring Physician: Physician/Extender: Suella Grove in Treatment: 0 Information Obtained from: Patient Chief Complaint Patient presents to the wound care center today with an open arterial ulcer. The patient has had left lower extremity ulceration for about 1 month. Electronic Signature(s) Signed: 09/07/2014 11:25:00 AM By: Christin Fudge MD, FACS Entered By: Christin Fudge on 09/07/2014 11:25:00 [name] ([id number]) -------------------------------------------------------------------------------- HPI Details Patient Name: [name] 09/07/2014 10:15 Date of Service: AM Medical Record [id number] Number: Patient Account Number: [id number] [birth year] (81 y.o. Treating RN: Date of Birth/Sex: Female) Other Clinician: Primary Care Physician: Einar Pheasant Treating Audi Wettstein Referring Physician: Physician/Extender: Suella Grove in Treatment: 0 History of Present Illness Location: left lower extremity ulceration Quality: Patient reports experiencing burning to affected area(s). Severity: Patient states wound are getting worse. Duration: Patient has had the wound for < 4 weeks prior to presenting for treatment Timing: Pain in wound is constant (hurts all the time) Context: The wound appeared gradually over time Modifying Factors: Other treatment(s) tried include:has been on oral anti-buttocks since the beginning of the month and had 10 days of Augmentin and now is on doxycycline. Associated Signs and Symptoms: Patient reports having difficulty standing for long periods. HPI Description: 81 year old patient was  admitted to [facility] between 08/13/2014 and 08/17/2014 with cellulitis of the left lower extremity.duplex study done then showed no DVT and the patient was on IV vancomycin and Rocephin and also received Unasyn later. The patient was sent home on 10 days of Augmentin. Her other comorbidities of atrial fibrillation, essential hypertension, diabetes mellitus type 2, coronary artery disease were managed with appropriate medications. on 09/04/2014 she was seen in the ER and was found to have a 10 cm ulceration on the left lower extremity lateral and superior to the lateral malleolus. The granulation tissue was noted and she was referred to the wound center. the ER notes that her ABIs were within normal limits and she was sent home on consultative management. a duplex scan of the lower extremities was done by the nursing home and this showed that the right ABI was 0.96 with a multiphasic waveform throughout the right leg. There was a monophasic waveform of the left leg suggesting left external iliac artery stenosis with blunting distally and the ABI could not be calculated on the left side. Moderate to severe ischemia was suspected of the left leg and a MR angiogram was recommended. Electronic Signature(s) Signed: 09/07/2014 11:27:44 AM By: Christin Fudge MD, FACS Previous Signature: 09/07/2014 10:30:15 AM Version By: Christin Fudge MD, FACS Entered By: Christin Fudge on 09/07/2014 11:27:44 [name] ([id number]) -------------------------------------------------------------------------------- Physical Exam Details Patient Name: [name] 09/07/2014 10:15 Date of Service: AM Medical Record [id number] Number: Patient Account Number: [id number] 03/24/1922 (81 y.o. Treating RN: Date of Birth/Sex: Female) Other Clinician: Primary Care Physician: Einar Pheasant Treating Savva Beamer Referring Physician: Physician/Extender: Suella Grove in Treatment:  0 Constitutional . Pulse regular. Respirations normal and unlabored. Afebrile. . Eyes Nonicteric. Reactive to light. Ears, Nose, Mouth, and Throat Lips, teeth, and gums WNL.Marland Kitchen Moist mucosa without lesions . Neck supple and nontender. No palpable supraclavicular or cervical adenopathy. Normal sized without goiter. Respiratory WNL. No retractions.. Breath sounds WNL, No rubs, rales, rhonchi, or  wheeze.. Cardiovascular Pedal Pulses faintly palpable on the right with a ABI of 0.96 on the right and on the left there was no dopplerable signal and the ABI could not be measured.. Gastrointestinal (GI) Abdomen without masses or tenderness.. No liver or spleen enlargement or tenderness.. Musculoskeletal Adexa without tenderness or enlargement.. Digits and nails w/o clubbing, cyanosis, infection, petechiae, ischemia, or inflammatory conditions.. Integumentary (Hair, Skin) No suspicious lesions. No crepitus or fluctuance. No peri-wound warmth or erythema. No masses.Marland Kitchen Psychiatric Judgement and insight Intact.. No evidence of depression, anxiety, or agitation.. Notes Large ulcerated area on the left lateral lower third of the leg which has significant amount of slough and is extremely tender. Medial to this on the shin that is a ulcerated area which extrudes pus on pressure and this is a second wound on the left lower leg. This pus has been cultured. Electronic Signature(s) Signed: 09/07/2014 11:30:56 AM By: Christin Fudge MD, FACS Entered By: Christin Fudge on 09/07/2014 11:30:54 [name] ([id number]) -------------------------------------------------------------------------------- Physician Orders Details Patient Name: [name], [name] 09/07/2014 10:15 Date of Service: AM Medical Record [id number] Number: Patient Account Number: [id number] 1922/07/22 (81 y.o. Treating RN: Cornell Barman Date of Birth/Sex: Female) Other Clinician: Primary Care Physician: Einar Pheasant Treating Christin Fudge Referring Physician: Physician/Extender: Suella Grove in Treatment: 0 Verbal / Phone Orders: Yes Clinician: Cornell Barman Read Back and Verified: Yes Diagnosis Coding Wound Cleansing Wound #1 Left,Lateral Lower Leg o Clean wound with wound cleanser. Anesthetic Wound #1 Left,Lateral Lower Leg o Topical Lidocaine 4% cream applied to wound bed prior to debridement Wound #2 Left,Anterior Lower Leg o Topical Lidocaine 4% cream applied to wound bed prior to debridement Primary Wound Dressing Wound #1 Left,Lateral Lower Leg o Santyl Ointment Wound #2 Left,Anterior Lower Leg o Iodoform packing Gauze Secondary Dressing Wound #1 Left,Lateral Lower Leg o Conform/Kerlix o Non-adherent pad Wound #2 Left,Anterior Lower Leg o Conform/Kerlix o Non-adherent pad Dressing Change Frequency Wound #1 Left,Lateral Lower Leg o Change dressing every day. Wound #2 Left,Anterior Lower Leg o Change dressing every day. CHARLESA, EHLE ([id number]) Follow-up Appointments Wound #1 Left,Lateral Lower Leg o Return Appointment in 1 week. Wound #2 Left,Anterior Lower Leg o Return Appointment in 1 week. Additional Orders / Instructions Wound #1 Left,Lateral Lower Leg o Increase protein intake. Medications-please add to medication list. Wound #1 Left,Lateral Lower Leg o Santyl Enzymatic Ointment Consults o Vascular - Consult with arterial study oooo Laboratory o Culture and Sensitivity - LLE oooo Radiology o X-ray, lower leg - Left lower leg oooo Notes If patients leg or toes begin to turn purple, go immediately to the nearest Emergency room. Electronic Signature(s) Signed: 09/07/2014 12:46:51 PM By: Christin Fudge MD, FACS [name] ([id number]) Signed: 09/07/2014 5:03:22 PM By: Gretta Cool RN, BSN, Kim RN, BSN Entered By: Gretta Cool, RN, BSN, Kim on 09/07/2014 11:31:15 [name], [name]  ([id number]) -------------------------------------------------------------------------------- Problem List Details Patient Name: AMERE, IOTT 09/07/2014 10:15 Date of Service: AM Medical Record [id number] Number: Patient Account Number: [id number] 1922/11/30 (81 y.o. Treating RN: Date of Birth/Sex: Female) Other Clinician: Primary Care Physician: Einar Pheasant Treating Ruchama Kubicek Referring Physician: Physician/Extender: Suella Grove in Treatment: 0 Active Problems ICD-10 Encounter Code Description Active Date Diagnosis I70.248 Atherosclerosis of native arteries of left leg with ulceration 09/07/2014 Yes of other part of lower left leg L97.222 Non-pressure chronic ulcer of left calf with fat layer 09/07/2014 Yes exposed L03.116 Cellulitis of left lower limb 09/07/2014 Yes Inactive Problems Resolved Problems Electronic Signature(s) Signed: 09/07/2014 11:24:19 AM By: Con Memos,  Ebunoluwa Gernert MD, FACS Entered By: Christin Fudge on 09/07/2014 11:24:19 [name] ([id number]) -------------------------------------------------------------------------------- Progress Note Details Patient Name: KHALESSI, BLOUGH 09/07/2014 10:15 Date of Service: AM Medical Record [id number] Number: Patient Account Number: [id number] May 21, 1922 (81 y.o. Treating RN: Date of Birth/Sex: Female) Other Clinician: Primary Care Physician: Einar Pheasant Treating Ilma Achee Referring Physician: Physician/Extender: Suella Grove in Treatment: 0 Subjective Chief Complaint Information obtained from Patient Patient presents to the wound care center today with an open arterial ulcer. The patient has had left lower extremity ulceration for about 1 month. History of Present Illness (HPI) The following HPI elements were documented for the patient's wound: Location: left lower extremity ulceration Quality: Patient reports experiencing burning to affected area(s). Severity: Patient states wound are getting  worse. Duration: Patient has had the wound for < 4 weeks prior to presenting for treatment Timing: Pain in wound is constant (hurts all the time) Context: The wound appeared gradually over time Modifying Factors: Other treatment(s) tried include:has been on oral anti-buttocks since the beginning of the month and had 10 days of Augmentin and now is on doxycycline. Associated Signs and Symptoms: Patient reports having difficulty standing for long periods. 81 year old patient was admitted to [facility] between 08/13/2014 and 08/17/2014 with cellulitis of the left lower extremity.duplex study done then showed no DVT and the patient was on IV vancomycin and Rocephin and also received Unasyn later. The patient was sent home on 10 days of Augmentin. Her other comorbidities of atrial fibrillation, essential hypertension, diabetes mellitus type 2, coronary artery disease were managed with appropriate medications. on 09/04/2014 she was seen in the ER and was found to have a 10 cm ulceration on the left lower extremity lateral and superior to the lateral malleolus. The granulation tissue was noted and she was referred to the wound center. the ER notes that her ABIs were within normal limits and she was sent home on consultative management. a duplex scan of the lower extremities was done by the nursing home and this showed that the right ABI was 0.96 with a multiphasic waveform throughout the right leg. There was a monophasic waveform of the left leg suggesting left external iliac artery stenosis with blunting distally and the ABI could not be calculated on the left side. Moderate to severe ischemia was suspected of the left leg and a MR angiogram was recommended. Wound History Patient presents with 1 open wound that has been present for approximately weeks. Patient has been treating wound in the following manner: xeroform. Laboratory tests have been performed in the last  month. ADRIONNA, [name] ([id number]) Patient reportedly has not tested positive for an antibiotic resistant organism. Patient reportedly has not tested positive for osteomyelitis. Patient reportedly has had testing performed to evaluate circulation in the legs. Patient experiences the following problems associated with their wounds: infection, swelling. Patient History Allergies mycinate, erythromycin ethylsuccinate Family History Diabetes - Child, Heart Disease - Paternal Grandparents, Hypertension - Mother, Father, No family history of Cancer, Hereditary Spherocytosis, Kidney Disease, Lung Disease, Seizures, Stroke, Thyroid Problems, Tuberculosis. Social History Never smoker, Marital Status - Widowed, Alcohol Use - Never, Drug Use - No History, Caffeine Use - Daily. Medical History Eyes Patient has history of Cataracts - removed Ear/Nose/Mouth/Throat Denies history of Chronic sinus problems/congestion, Middle ear problems Hematologic/Lymphatic Denies history of Anemia, Hemophilia, Human Immunodeficiency Virus, Lymphedema, Sickle Cell Disease Respiratory Denies history of Aspiration, Asthma, Chronic Obstructive Pulmonary Disease (COPD), Pneumothorax, Sleep Apnea, Tuberculosis Cardiovascular Patient has history of Arrhythmia -  afib, Hypertension, Peripheral Arterial Disease, Peripheral Venous Disease Gastrointestinal Denies history of Cirrhosis , Colitis, Crohn s, Hepatitis A, Hepatitis B, Hepatitis C Endocrine Denies history of Type II Diabetes Genitourinary Denies history of End Stage Renal Disease Immunological Denies history of Lupus Erythematosus, Raynaud s, Scleroderma Integumentary (Skin) Denies history of History of Burn, History of pressure wounds Musculoskeletal Patient has history of Osteoarthritis Neurologic Denies history of Dementia, Neuropathy, Quadriplegia, Paraplegia, Seizure Disorder Oncologic Denies history of Received Chemotherapy, Received  Radiation Psychiatric Patient has history of Confinement Anxiety Hospitalization/Surgery History - [facility], celulitis. KENLYNN, HOUDE ([id number]) Medical And Surgical History Notes Cardiovascular hypokalemia, stroke Review of Systems (ROS) Constitutional Symptoms (General Health) The patient has no complaints or symptoms. Eyes Complains or has symptoms of Glasses / Contacts. Ear/Nose/Mouth/Throat The patient has no complaints or symptoms. Hematologic/Lymphatic The patient has no complaints or symptoms. Respiratory The patient has no complaints or symptoms. Cardiovascular The patient has no complaints or symptoms. Gastrointestinal The patient has no complaints or symptoms. Endocrine The patient has no complaints or symptoms. Genitourinary The patient has no complaints or symptoms. Immunological The patient has no complaints or symptoms. Integumentary (Skin) Complains or has symptoms of Wounds, Bleeding or bruising tendency, Breakdown, Swelling. Musculoskeletal The patient has no complaints or symptoms. Neurologic The patient has no complaints or symptoms. Oncologic The patient has no complaints or symptoms. Psychiatric Complains or has symptoms of Anxiety. Medications Norco 5 mg-325 mg tablet oral 1 1 tablet oral Acidophilus capsule oral capsule oral lisinopril 40 mg tablet oral 1 1 tablet oral metoprolol tartrate 25 mg tablet oral tablet oral Eliquis 2.5 mg tablet oral 1 1 tablet oral Aquaphor topical ointment topical ointment topical Colace 100 mg capsule oral capsule oral senna 8.6 mg tablet oral tablet oral furosemide 20 mg tablet oral 1 1 tablet oral Klor-Con M10 mEq tablet,extended release oral 1 1 tablet,ER particles/crystals oral Mcdaniel, Kimball L. ([id number]) doxycycline hyclate 100 mg capsule oral 1 1 capsule oral nystatin 100,000 unit/gram topical cream topical cream topical Objective Constitutional Pulse regular. Respirations normal and unlabored.  Afebrile. Vitals Time Taken: 10:32 AM, Height: 67 in, Source: Stated, Weight: 159 lbs, Source: Stated, BMI: 24.9, Temperature: 98.2 F, Pulse: 93 bpm, Respiratory Rate: 18 breaths/min, Blood Pressure: 141/88 mmHg. Eyes Nonicteric. Reactive to light. Ears, Nose, Mouth, and Throat Lips, teeth, and gums WNL.Marland Kitchen Moist mucosa without lesions . Neck supple and nontender. No palpable supraclavicular or cervical adenopathy. Normal sized without goiter. Respiratory WNL. No retractions.. Breath sounds WNL, No rubs, rales, rhonchi, or wheeze.. Cardiovascular Pedal Pulses faintly palpable on the right with a ABI of 0.96 on the right and on the left there was no dopplerable signal and the ABI could not be measured.. Gastrointestinal (GI) Abdomen without masses or tenderness.. No liver or spleen enlargement or tenderness.. Musculoskeletal Adexa without tenderness or enlargement.. Digits and nails w/o clubbing, cyanosis, infection, petechiae, ischemia, or inflammatory conditions.Marland Kitchen Psychiatric Judgement and insight Intact.. No evidence of depression, anxiety, or agitation.. General Notes: Large ulcerated area on the left lateral lower third of the leg which has significant amount of slough and is extremely tender. Medial to this on the shin that is a ulcerated area which extrudes pus on pressure and this is a second wound on the left lower leg. This pus has been cultured. Integumentary (Hair, Skin) No suspicious lesions. No crepitus or fluctuance. No peri-wound warmth or erythema. No masses.Marland Kitchen [name], [name] ([id number]) Wound #1 status is Open. Original cause of wound was Gradually Appeared. The wound  is located on the Left,Lateral Lower Leg. The wound measures 8.4cm length x 4cm width x 0.2cm depth; 26.389cm^2 area and 5.278cm^3 volume. The wound is limited to skin breakdown. There is no tunneling or undermining noted. There is a medium amount of serosanguineous drainage noted. The wound margin is  distinct with the outline attached to the wound base. There is medium (34-66%) red, pink granulation within the wound bed. The periwound skin appearance exhibited: Localized Edema, Moist, Mottled. The periwound skin appearance did not exhibit: Callus, Crepitus, Excoriation, Fluctuance, Friable, Induration, Rash, Scarring, Dry/Scaly, Maceration, Atrophie Blanche, Cyanosis, Ecchymosis, Hemosiderin Staining, Pallor, Rubor, Erythema. Periwound temperature was noted as No Abnormality. The periwound has tenderness on palpation. Wound #2 status is Open. Original cause of wound was Gradually Appeared. The wound is located on the Left,Anterior Lower Leg. The wound measures 0.5cm length x 0.5cm width x 0.9cm depth; 0.196cm^2 area and 0.177cm^3 volume. The wound is limited to skin breakdown. There is no undermining noted, however, there is tunneling at 12:00 with a maximum distance of 1.4cm. There is a medium amount of serosanguineous drainage noted. The wound margin is distinct with the outline attached to the wound base. There is medium (34-66%) pale granulation within the wound bed. There is no necrotic tissue within the wound bed. The periwound skin appearance exhibited: Rash, Moist, Mottled. The periwound skin appearance did not exhibit: Callus, Crepitus, Excoriation, Fluctuance, Friable, Induration, Localized Edema, Scarring, Dry/Scaly, Maceration, Atrophie Blanche, Cyanosis, Ecchymosis, Hemosiderin Staining, Pallor, Rubor, Erythema. Periwound temperature was noted as No Abnormality. The periwound has tenderness on palpation. Large ulcerated area on the left lateral lower third of the leg which has significant amount of slough and is extremely tender. Medial to this on the shin that is a ulcerated area which extrudes pus on pressure and this is a second wound on the left lower leg. This pus has been cultured. Assessment Active Problems ICD-10 I70.248 - Atherosclerosis of native arteries of left  leg with ulceration of other part of lower left leg L97.222 - Non-pressure chronic ulcer of left calf with fat layer exposed L03.116 - Cellulitis of left lower limb 81 year old patient who was treated for cellulitis in the recent past has significant arterial compromise of the left lower extremity and needs a vascular opinion as soon as possible. We will call for this today. She also needs Santyl on the lateral wound and will need iodoform packing on the medial wound. [name], [name] ([id number]) Recommended an x-ray of the left lower extremity to be done to rule out any possibility of osteomyelitis. We will also see her back as soon as possible after her vascular consultation. We have notified the caregiver that if at any stage her legs start hurting much more or changes color or there is significant compromise of the vascularity she should report to the ER immediately. She will continue on oral anti-buttocks and we will await the culture. Management discussed with the patient's caregiver was at the bedside. Plan Wound Cleansing: Wound #1 Left,Lateral Lower Leg: Clean wound with wound cleanser. Anesthetic: Wound #1 Left,Lateral Lower Leg: Topical Lidocaine 4% cream applied to wound bed prior to debridement Primary Wound Dressing: Wound #1 Left,Lateral Lower Leg: Santyl Ointment Secondary Dressing: Wound #1 Left,Lateral Lower Leg: Conform/Kerlix Non-adherent pad Dressing Change Frequency: Wound #1 Left,Lateral Lower Leg: Change dressing every day. Follow-up Appointments: Wound #1 Left,Lateral Lower Leg: Return Appointment in 1 week. Additional Orders / Instructions: Wound #1 Left,Lateral Lower Leg: Increase protein intake. Medications-please add to medication list.:  Wound #1 Left,Lateral Lower Leg: Santyl Enzymatic Ointment Laboratory ordered were: Culture and Sensitivity - LLE Radiology ordered were: X-ray, lower leg - Left lower leg Consults ordered were: Vascular -  Consult with arterial study General Notes: If patients leg or toes begin to turn purple, go immediately to the nearest Emergency room. [name], [name] ([id number]) 81 year old patient who was treated for cellulitis in the recent past has significant arterial compromise of the left lower extremity and needs a vascular opinion as soon as possible. We will call for this today. She also needs Santyl on the lateral wound and will need iodoform packing on the medial wound. Recommended an x-ray of the left lower extremity to be done to rule out any possibility of osteomyelitis. We will also see her back as soon as possible after her vascular consultation. We have notified the caregiver that if at any stage her legs start hurting much more or changes color or there is significant compromise of the vascularity she should report to the ER immediately. She will continue on oral anti-buttocks and we will await the culture. Management discussed with the patient's caregiver was at the bedside. Electronic Signature(s) Signed: 09/07/2014 11:34:03 AM By: Christin Fudge MD, FACS Entered By: Christin Fudge on 09/07/2014 11:34:03 [name] ([id number]) -------------------------------------------------------------------------------- ROS/PFSH Details Patient Name: [name], [name] 09/07/2014 10:15 Date of Service: AM Medical Record [id number] Number: Patient Account Number: [id number] [birth year] (82 y.o. Treating RN: Afful, RN, BSN, Velva Harman Date of Birth/Sex: Female) Other Clinician: Primary Care Physician: Einar Pheasant Treating [place] Referring Physician: Physician/Extender: Suella Grove in Treatment: 0 Wound History Do you currently have one or more open woundso Yes How many open wounds do you currently haveo 1 Approximately how long have you had your woundso weeks How have you been treating your wound(s) until nowo xeroform Has your wound(s) ever healed and then re-openedo No Have you had any  lab work done in the past montho Yes Have you tested positive for an antibiotic resistant organism (MRSA, No VRE)o Have you tested positive for osteomyelitis (bone infection)o No Have you had any tests for circulation on your legso Yes Who ordered the testo [facility] physician Have you had other problems associated with your woundso Infection, Swelling Eyes Complaints and Symptoms: Positive for: Glasses / Contacts Medical History: Positive for: Cataracts - removed Integumentary (Skin) Complaints and Symptoms: Positive for: Wounds; Bleeding or bruising tendency; Breakdown; Swelling Medical History: Negative for: History of Burn; History of pressure wounds Psychiatric Complaints and Symptoms: Positive for: Anxiety Medical History: Positive for: Confinement Anxiety Constitutional Symptoms (General Health) [name], [name] ([id number]) Complaints and Symptoms: No Complaints or Symptoms Ear/Nose/Mouth/Throat Complaints and Symptoms: No Complaints or Symptoms Medical History: Negative for: Chronic sinus problems/congestion; Middle ear problems Hematologic/Lymphatic Complaints and Symptoms: No Complaints or Symptoms Medical History: Negative for: Anemia; Hemophilia; Human Immunodeficiency Virus; Lymphedema; Sickle Cell Disease Respiratory Complaints and Symptoms: No Complaints or Symptoms Medical History: Negative for: Aspiration; Asthma; Chronic Obstructive Pulmonary Disease (COPD); Pneumothorax; Sleep Apnea; Tuberculosis Cardiovascular Complaints and Symptoms: No Complaints or Symptoms Medical History: Positive for: Arrhythmia - afib; Hypertension; Peripheral Arterial Disease; Peripheral Venous Disease Past Medical History Notes: hypokalemia, stroke Gastrointestinal Complaints and Symptoms: No Complaints or Symptoms Medical History: Negative for: Cirrhosis ; Colitis; Crohnos; Hepatitis A; Hepatitis B; Hepatitis C Endocrine Complaints and Symptoms: No  Complaints or Symptoms Medical HistoryMARIACRISTINA, [name] ([id number]) Negative for: Type II Diabetes Genitourinary Complaints and Symptoms: No Complaints or Symptoms Medical History: Negative for: End Stage  Renal Disease Immunological Complaints and Symptoms: No Complaints or Symptoms Medical History: Negative for: Lupus Erythematosus; Raynaudos; Scleroderma Musculoskeletal Complaints and Symptoms: No Complaints or Symptoms Medical History: Positive for: Osteoarthritis Neurologic Complaints and Symptoms: No Complaints or Symptoms Medical History: Negative for: Dementia; Neuropathy; Quadriplegia; Paraplegia; Seizure Disorder Oncologic Complaints and Symptoms: No Complaints or Symptoms Medical History: Negative for: Received Chemotherapy; Received Radiation HBO Extended History Items Eyes: Cataracts Hospitalization / Surgery History Name of Hospital Purpose of Hospitalization/Surgery Date [facility] celulitis Family and Social History [name], [name] ([id number]) Cancer: No; Diabetes: Yes - Child; Heart Disease: Yes - Paternal Grandparents; Hereditary Spherocytosis: No; Hypertension: Yes - Mother, Father; Kidney Disease: No; Lung Disease: No; Seizures: No; Stroke: No; Thyroid Problems: No; Tuberculosis: No; Never smoker; Marital Status - Widowed; Alcohol Use: Never; Drug Use: No History; Caffeine Use: Daily; Advanced Directives: No; Patient does not want information on Advanced Directives; [name] Will: No Physician Affirmation I have reviewed and agree with the above information. Electronic Signature(s) Signed: 09/07/2014 10:50:01 AM By: Christin Fudge MD, FACS Signed: 09/07/2014 2:16:09 PM By: Regan Lemming BSN, RN Entered By: Christin Fudge on 09/07/2014 10:50:00 [name] ([id number]) -------------------------------------------------------------------------------- SuperBill Details Patient Name: [name] Date of Service: 09/07/2014 Medical Record Patient  Account Number: [id number] Number: Afful, RN, BSN, Treating RN: Apr 03, 1922 (81 y.o. Velva Harman Date of Birth/Sex: Female) Other Clinician: Primary Care Physician: Einar Pheasant Treating Raquelle Pietro Referring Physician: Physician/Extender: Suella [place] in Treatment: 0 Diagnosis Coding ICD-10 Codes Code Description I70.248 Atherosclerosis of native arteries of left leg with ulceration of other part of lower left leg L97.222 Non-pressure chronic ulcer of left calf with fat layer exposed L03.116 Cellulitis of left lower limb Facility Procedures CPT4 Code: 19758832 Description: 54982 - WOUND CARE VISIT-LEV 3 EST PT Modifier: Quantity: 1 Physician Procedures CPT4: Description Modifier Quantity Code 6415830 94076 - WC PHYS LEVEL 4 - NEW PT 1 ICD-10 Description Diagnosis I70.248 Atherosclerosis of native arteries of left leg with ulceration of other part of lower left leg L97.222 Non-pressure chronic ulcer of  left calf with fat layer exposed L03.116 Cellulitis of left lower limb Electronic Signature(s) Signed: 09/07/2014 11:34:20 AM By: Christin Fudge MD, FACS Entered By: Christin Fudge on 09/07/2014 11:34:20

## 2014-09-08 NOTE — Progress Notes (Signed)
ELASIA, FURNISH ([id number]) Visit Report for 09/07/2014 Allergy List Details Patient Name: [name], [name]. Date of Service: 09/07/2014 10:15 AM Medical Record Number: [id number] Patient Account Number: [id number] Date of Birth/Sex: [birth year] (81 y.o. Female) Treating RN: Baruch Gouty, RN, BSN, Velva Harman Primary Care Physician: Einar Pheasant Other Clinician: Referring Physician: Treating Physician/Extender: Frann Rider in Treatment: 0 Allergies Active Allergies mycinate erythromycin ethylsuccinate Allergy Notes Electronic Signature(s) Signed: 09/07/2014 2:16:09 PM By: Regan Lemming BSN, RN Entered By: Regan Lemming on 09/07/2014 10:35:24 [name] ([id number]) -------------------------------------------------------------------------------- Arrival Information Details Patient Name: [name] Date of Service: 09/07/2014 10:15 AM Medical Record Number: [id number] Patient Account Number: [id number] Date of Birth/Sex: [birth year] (81 y.o. Female) Treating RN: Afful, RN, BSN, Velva Harman Primary Care Physician: Einar Pheasant Other Clinician: Referring Physician: Treating Physician/Extender: Frann Rider in Treatment: 0 Visit Information Patient Arrived: Wheel Chair Arrival Time: 10:31 Accompanied By: caregiver Transfer Assistance: None Patient Identification Verified: Yes Patient Requires Transmission- No Based Precautions: Patient Has Alerts: Yes Patient Alerts: Patient on Blood Thinner Eliquis. R ABI:0.96 Electronic Signature(s) Signed: 09/07/2014 2:16:09 PM By: Regan Lemming BSN, RN Entered By: Regan Lemming on 09/07/2014 11:22:42 [name] ([id number]) -------------------------------------------------------------------------------- Clinic Level of Care Assessment Details Patient Name: [name]. Date of Service: 09/07/2014 10:15 AM Medical Record Number: [id number] Patient Account Number: [id number] Date of Birth/Sex: [birth year] (81 y.o.  Female) Treating RN: Cornell Barman Primary Care Physician: Einar Pheasant Other Clinician: Referring Physician: Treating Physician/Extender: Frann Rider in Treatment: 0 Clinic Level of Care Assessment Items TOOL 1 Quantity Score []  - Use when EandM and Procedure is performed on INITIAL visit 0 ASSESSMENTS - Nursing Assessment / Reassessment X - General Physical Exam (combine w/ comprehensive assessment (listed just 1 20 below) when performed on new pt. evals) X - Comprehensive Assessment (HX, ROS, Risk Assessments, Wounds Hx, etc.) 1 25 ASSESSMENTS - Wound and Skin Assessment / Reassessment []  - Dermatologic / Skin Assessment (not related to wound area) 0 ASSESSMENTS - Ostomy and/or Continence Assessment and Care []  - Incontinence Assessment and Management 0 []  - Ostomy Care Assessment and Management (repouching, etc.) 0 PROCESS - Coordination of Care X - Simple Patient / Family Education for ongoing care 1 15 []  - Complex (extensive) Patient / Family Education for ongoing care 0 X - Staff obtains Consents, Records, Test Results / Process Orders 1 10 []  - Staff telephones HHA, Nursing Homes / Clarify orders / etc 0 []  - Routine Transfer to another Facility (non-emergent condition) 0 []  - Routine Hospital Admission (non-emergent condition) 0 X - New Admissions / Biomedical engineer / Ordering NPWT, Apligraf, etc. 1 15 []  - Emergency Hospital Admission (emergent condition) 0 PROCESS - Special Needs []  - Pediatric / Minor Patient Management 0 []  - Isolation Patient Management 0 [name], [name]. ([id number]) []  - Hearing / Language / Visual special needs 0 []  - Assessment of Community assistance (transportation, D/C planning, etc.) 0 []  - Additional assistance / Altered mentation 0 []  - Support Surface(s) Assessment (bed, cushion, seat, etc.) 0 INTERVENTIONS - Miscellaneous []  - External ear exam 0 []  - Patient Transfer (multiple staff / Civil Service fast streamer / Similar devices)  0 []  - Simple Staple / Suture removal (25 or less) 0 []  - Complex Staple / Suture removal (26 or more) 0 []  - Hypo/Hyperglycemic Management (do not check if billed separately) 0 X - Ankle / Brachial Index (ABI) - do not check if billed separately 1 15 Has the patient been seen at the  hospital within the last three years: Yes Total Score: 100 Level Of Care: New/Established - Level 3 Electronic Signature(s) Signed: 09/07/2014 5:03:22 PM By: Gretta Cool, RN, BSN, Kim RN, BSN Entered By: Gretta Cool, RN, BSN, Kim on 09/07/2014 11:21:19 [name] ([id number]) -------------------------------------------------------------------------------- Lower Extremity Assessment Details Patient Name: [name]. Date of Service: 09/07/2014 10:15 AM Medical Record Number: [id number] Patient Account Number: [id number] Date of Birth/Sex: 11/06/1922 (81 y.o. Female) Treating RN: Afful, RN, BSN, [place] Primary Care Physician: Einar Pheasant Other Clinician: Referring Physician: Treating Physician/Extender: Frann Rider in Treatment: 0 Edema Assessment Assessed: [Left: No] [Right: No] Edema: [Left: Yes] [Right: Yes] Calf Left: Right: Point of Measurement: 37 cm From Medial Instep 37.3 cm 38.2 cm Ankle Left: Right: Point of Measurement: 12 cm From Medial Instep 24.5 cm 25.4 cm Vascular Assessment Pulses: Posterior Tibial Dorsalis Pedis Palpable: [Left:Yes] [Right:Yes] Extremity colors, hair growth, and conditions: Extremity Color: [Left:Mottled] [Right:Mottled] Hair Growth on Extremity: [Left:Yes] [Right:Yes] Temperature of Extremity: [Left:Warm] [Right:Warm] Capillary Refill: [Left:< 3 seconds] [Right:< 3 seconds] Dependent Rubor: [Left:No] [Right:No] Blanched when Elevated: [Left:No] [Right:No] Lipodermatosclerosis: [Left:No] [Right:No] Blood Pressure: Brachial: [Right:141] Dorsalis Pedis: [Left:Dorsalis Pedis: 267] Ankle: Posterior Tibial: [Left:Posterior Tibial:] [Right:0.96] Toe  Nail Assessment Left: Right: Thick: Yes Yes Discolored: Yes Yes Deformed: No No Improper Length and Hygiene: Yes Yes [name], Kara L. ([id number]) Notes ABI not competd due to pain to affected extremity. secondary she had an ABI done by vascular 09/06/14; 0.96 Electronic Signature(s) Signed: 09/07/2014 2:16:09 PM By: Regan Lemming BSN, RN Entered By: Regan Lemming on 09/07/2014 10:58:27 [name] ([id number]) -------------------------------------------------------------------------------- Multi Wound Chart Details Patient Name: [name]. Date of Service: 09/07/2014 10:15 AM Medical Record Number: [id number] Patient Account Number: [id number] Date of Birth/Sex: Sep 30, 1922 (81 y.o. Female) Treating RN: Cornell Barman Primary Care Physician: Einar Pheasant Other Clinician: Referring Physician: Treating Physician/Extender: Frann Rider in Treatment: 0 Vital Signs Height(in): 67 Pulse(bpm): 93 Weight(lbs): 159 Blood Pressure 141/88 (mmHg): Body Mass Index(BMI): 25 Temperature(F): 98.2 Respiratory Rate 18 (breaths/min): Photos: [1:No Photos] [N/A:N/A] Wound Location: [1:Left Lower Leg - Lateral] [N/A:N/A] Wounding Event: [1:Gradually Appeared] [N/A:N/A] Primary Etiology: [1:Venous Leg Ulcer] [N/A:N/A] Secondary Etiology: [1:Cellulitis] [N/A:N/A] Comorbid History: [1:Cataracts, Arrhythmia, Hypertension, Peripheral Arterial Disease, Peripheral Venous Disease, Osteoarthritis, Confinement Anxiety] [N/A:N/A] Date Acquired: [1:08/17/2014] [N/A:N/A] Weeks of Treatment: [1:0] [N/A:N/A] Wound Status: [1:Open] [N/A:N/A] Measurements L x W x D 8.4x4x0.2 [N/A:N/A] (cm) Area (cm) : [1:26.389] [N/A:N/A] Volume (cm) : [1:5.278] [N/A:N/A] % Reduction in Area: [1:0.00%] [N/A:N/A] % Reduction in Volume: 0.00% [N/A:N/A] Classification: [1:Full Thickness Without Exposed Support Structures] [N/A:N/A] Exudate Amount: [1:Medium] [N/A:N/A] Exudate Type: [1:Serosanguineous]  [N/A:N/A] Exudate Color: [1:red, brown] [N/A:N/A] Wound Margin: [1:Distinct, outline attached] [N/A:N/A] Granulation Amount: [1:Medium (34-66%)] [N/A:N/A] Granulation Quality: [1:Red, Pink] [N/A:N/A] Exposed Structures: [N/A:N/A] Fascia: No Fat: No Tendon: No Muscle: No Joint: No Bone: No Limited to Skin Breakdown Epithelialization: None N/A N/A Periwound Skin Texture: Edema: Yes N/A N/A Excoriation: No Induration: No Callus: No Crepitus: No Fluctuance: No Friable: No Rash: No Scarring: No Periwound Skin Moist: Yes N/A N/A Moisture: Maceration: No Dry/Scaly: No Periwound Skin Color: Mottled: Yes N/A N/A Atrophie Blanche: No Cyanosis: No Ecchymosis: No Erythema: No Hemosiderin Staining: No Pallor: No Rubor: No Temperature: No Abnormality N/A N/A Tenderness on Yes N/A N/A Palpation: Wound Preparation: Ulcer Cleansing: N/A N/A Rinsed/Irrigated with Saline Topical Anesthetic Applied: Other: lidocaine 4% Treatment Notes Electronic Signature(s) Signed: 09/07/2014 5:03:22 PM By: Gretta Cool, RN, BSN, Kim RN, BSN Entered By: Gretta Cool, RN, BSN, Kim on 09/07/2014 11:09:17  [name], [name] ([id number]) -------------------------------------------------------------------------------- [place] Details Patient Name: [name], [name]. Date of Service: 09/07/2014 10:15 AM Medical Record Number: [id number] Patient Account Number: [id number] Date of Birth/Sex: [birth year] (81 y.o. Female) Treating RN: Cornell Barman Primary Care Physician: Einar Pheasant Other Clinician: Referring Physician: Treating Physician/Extender: Frann Rider in Treatment: 0 Active Inactive Abuse / Safety / Falls / Self Care Management Nursing Diagnoses: Potential for falls Goals: Patient will remain injury free Date Initiated: 09/07/2014 Goal Status: Active Interventions: Assess fall risk on admission and as needed Notes: Nutrition Nursing Diagnoses: Potential for alteratiion  in Nutrition/Potential for imbalanced nutrition Goals: Patient/caregiver agrees to and verbalizes understanding of need to use nutritional supplements and/or vitamins as prescribed Date Initiated: 09/07/2014 Goal Status: Active Interventions: Assess patient nutrition upon admission and as needed per policy Notes: Orientation to the Wound Care Program Nursing Diagnoses: Knowledge deficit related to the wound healing center program Goals: Patient/caregiver will verbalize understanding of the [place] ([id number]) Date Initiated: 09/07/2014 Goal Status: Active Interventions: Provide education on orientation to the wound center Notes: Wound/Skin Impairment Nursing Diagnoses: Impaired tissue integrity Goals: Patient/caregiver will verbalize understanding of skin care regimen Date Initiated: 09/07/2014 Goal Status: Active Ulcer/skin breakdown will heal within 14 weeks Date Initiated: 09/07/2014 Goal Status: Active Interventions: Assess patient/caregiver ability to obtain necessary supplies Assess ulceration(s) every visit Notes: Electronic Signature(s) Signed: 09/07/2014 5:03:22 PM By: Gretta Cool, RN, BSN, Kim RN, BSN Entered By: Gretta Cool, RN, BSN, Kim on 09/07/2014 11:09:00 [name] ([id number]) -------------------------------------------------------------------------------- Pain Assessment Details Patient Name: [name]. Date of Service: 09/07/2014 10:15 AM Medical Record Number: [id number] Patient Account Number: [id number] Date of Birth/Sex: 1923-02-02 (81 y.o. Female) Treating RN: Baruch Gouty, RN, BSN, Velva Harman Primary Care Physician: Einar Pheasant Other Clinician: Referring Physician: Treating Physician/Extender: Frann Rider in Treatment: 0 Active Problems Location of Pain Severity and Description of Pain Patient Has Paino Yes Site Locations Rate the pain. Current Pain Level: 3 Pain Management and Medication Current Pain  Management: Rest: Yes How does your pain impact your activities of daily livingo Sleep: Yes Bathing: Yes Appetite: Yes Relationship With Others: Yes Bladder Continence: Yes Emotions: Yes Bowel Continence: Yes Work: Yes Toileting: Yes Drive: Yes Dressing: Yes Hobbies: Yes Electronic Signature(s) Signed: 09/07/2014 2:16:09 PM By: Regan Lemming BSN, RN Entered By: Regan Lemming on 09/07/2014 10:32:39 [name] ([id number]) -------------------------------------------------------------------------------- Wound Assessment Details Patient Name: [name]. Date of Service: 09/07/2014 10:15 AM Medical Record Number: [id number] Patient Account Number: [id number] Date of Birth/Sex: Jul 03, 1922 (81 y.o. Female) Treating RN: Afful, RN, BSN, Velva Harman Primary Care Physician: Einar Pheasant Other Clinician: Referring Physician: Treating Physician/Extender: Frann Rider in Treatment: 0 Wound Status Wound Number: 1 Primary Venous Leg Ulcer Etiology: Wound Location: Left Lower Leg - Lateral Secondary Cellulitis Wounding Event: Gradually Appeared Etiology: Date Acquired: 08/17/2014 Wound Open Weeks Of Treatment: 0 Status: Clustered Wound: No Comorbid Cataracts, Arrhythmia, Hypertension, History: Peripheral Arterial Disease, Peripheral Venous Disease, Osteoarthritis, Confinement Anxiety Photos Photo Uploaded By: Regan Lemming on 09/07/2014 14:30:40 Wound Measurements Length: (cm) 8.4 Width: (cm) 4 Depth: (cm) 0.2 Area: (cm) 26.389 Volume: (cm) 5.278 % Reduction in Area: 0% % Reduction in Volume: 0% Epithelialization: None Tunneling: No Undermining: No Wound Description Full Thickness Without Exposed Classification: Support Structures Wound Margin: Distinct, outline attached Exudate Medium Amount: Exudate Type: Serosanguineous Exudate Color: red, brown [name], Kara L. ([id number]) Foul Odor After Cleansing: No Wound Bed Granulation Amount: Medium (34-66%)  Exposed Structure Granulation Quality: Red,  Pink Fascia Exposed: No Fat Layer Exposed: No Tendon Exposed: No Muscle Exposed: No Joint Exposed: No Bone Exposed: No Limited to Skin Breakdown Periwound Skin Texture Texture Color No Abnormalities Noted: No No Abnormalities Noted: No Callus: No Atrophie Blanche: No Crepitus: No Cyanosis: No Excoriation: No Ecchymosis: No Fluctuance: No Erythema: No Friable: No Hemosiderin Staining: No Induration: No Mottled: Yes Localized Edema: Yes Pallor: No Rash: No Rubor: No Scarring: No Temperature / Pain Moisture Temperature: No Abnormality No Abnormalities Noted: No Tenderness on Palpation: Yes Dry / Scaly: No Maceration: No Moist: Yes Wound Preparation Ulcer Cleansing: Rinsed/Irrigated with Saline Topical Anesthetic Applied: Other: lidocaine 4%, Electronic Signature(s) Signed: 09/07/2014 2:16:09 PM By: Regan Lemming BSN, RN Entered By: Regan Lemming on 09/07/2014 10:49:56 [name] ([id number]) -------------------------------------------------------------------------------- Wound Assessment Details Patient Name: [name]. Date of Service: 09/07/2014 10:15 AM Medical Record Number: [id number] Patient Account Number: [id number] Date of Birth/Sex: 17-Aug-1922 (81 y.o. Female) Treating RN: Cornell Barman Primary Care Physician: Einar Pheasant Other Clinician: Referring Physician: Treating Physician/Extender: Frann Rider in Treatment: 0 Wound Status Wound Number: 2 Primary Cellulitis Etiology: Wound Location: Left Lower Leg - Anterior Wound Open Wounding Event: Gradually Appeared Status: Date Acquired: 09/07/2014 Comorbid Cataracts, Arrhythmia, Hypertension, Weeks Of Treatment: 0 History: Peripheral Arterial Disease, Peripheral Clustered Wound: No Venous Disease, Osteoarthritis, Confinement Anxiety Photos Photo Uploaded By: Regan Lemming on 09/07/2014 14:30:41 Wound Measurements Length: (cm) 0.5 Width:  (cm) 0.5 Depth: (cm) 0.9 Area: (cm) 0.196 Volume: (cm) 0.177 % Reduction in Area: 0% % Reduction in Volume: 0% Epithelialization: None Tunneling: Yes Position (o'clock): 12 Maximum Distance: (cm) 1.4 Undermining: No Wound Description Classification: Partial Thickness Foul Odor Af Wound Margin: Distinct, outline attached Exudate Amount: Medium Exudate Type: Serosanguineous Exudate Color: red, brown ter Cleansing: No Wound Bed [name], Kara L. ([id number]) Granulation Amount: Medium (34-66%) Exposed Structure Granulation Quality: Pale Fascia Exposed: No Necrotic Amount: None Present (0%) Fat Layer Exposed: No Tendon Exposed: No Muscle Exposed: No Joint Exposed: No Bone Exposed: No Limited to Skin Breakdown Periwound Skin Texture Texture Color No Abnormalities Noted: No No Abnormalities Noted: No Callus: No Atrophie Blanche: No Crepitus: No Cyanosis: No Excoriation: No Ecchymosis: No Fluctuance: No Erythema: No Friable: No Hemosiderin Staining: No Induration: No Mottled: Yes Localized Edema: No Pallor: No Rash: Yes Rubor: No Scarring: No Temperature / Pain Moisture Temperature: No Abnormality No Abnormalities Noted: No Tenderness on Palpation: Yes Dry / Scaly: No Maceration: No Moist: Yes Wound Preparation Ulcer Cleansing: Rinsed/Irrigated with Saline Topical Anesthetic Applied: None Electronic Signature(s) Signed: 09/07/2014 5:03:22 PM By: Gretta Cool, RN, BSN, Kim RN, BSN Entered By: Gretta Cool, RN, BSN, Kim on 09/07/2014 11:27:51 [name] ([id number]) -------------------------------------------------------------------------------- [place] Details Patient Name: [name]. Date of Service: 09/07/2014 10:15 AM Medical Record Number: [id number] Patient Account Number: [id number] Date of Birth/Sex: February 05, 1923 (81 y.o. Female) Treating RN: Afful, RN, BSN, Velva Harman Primary Care Physician: Einar Pheasant Other Clinician: Referring Physician: Treating  Physician/Extender: Frann Rider in Treatment: 0 Vital Signs Time Taken: 10:32 Temperature (F): 98.2 Height (in): 67 Pulse (bpm): 93 Source: Stated Respiratory Rate (breaths/min): 18 Weight (lbs): 159 Blood Pressure (mmHg): 141/88 Source: Stated Reference Range: 80 - 120 mg / dl Body Mass Index (BMI): 24.9 Electronic Signature(s) Signed: 09/07/2014 2:16:09 PM By: Regan Lemming BSN, RN Entered By: Regan Lemming on 09/07/2014 10:34:49

## 2014-09-10 LAB — WOUND CULTURE

## 2014-09-11 ENCOUNTER — Other Ambulatory Visit: Payer: Self-pay | Admitting: Vascular Surgery

## 2014-09-11 DIAGNOSIS — L98499 Non-pressure chronic ulcer of skin of other sites with unspecified severity: Principal | ICD-10-CM

## 2014-09-11 DIAGNOSIS — I70209 Unspecified atherosclerosis of native arteries of extremities, unspecified extremity: Secondary | ICD-10-CM

## 2014-09-11 MED ORDER — SODIUM CHLORIDE 0.9 % IV SOLN: 75.0000 mL | Freq: Once | INTRAVENOUS | Status: DC

## 2014-09-11 MED ORDER — SODIUM CHLORIDE 0.9 % IV SOLN: 1.0000 g | Freq: Once | INTRAVENOUS | Status: DC

## 2014-09-14 ENCOUNTER — Telehealth: Payer: Self-pay | Admitting: Internal Medicine

## 2014-09-14 NOTE — Telephone Encounter (Signed)
FYI

## 2014-09-14 NOTE — Telephone Encounter (Signed)
[facility] Patient Name: [name] Gender: Female DOB: 1922-07-15 Age: 81 Y 3 M 14 D Return Phone Number: [phone] (Primary), [phone] (Secondary) Address: City/State/Zip: [place] [facility] Client Site [place] - Day Physician Nicki Reaper, [place] Type Call Call Type Triage / Clinical Caller Name Becky Relationship To Patient Daughter Return Phone Number [phone] (Primary) Chief Complaint Leg Swelling And Edema Initial Comment Caller states mother has a wound on her left leg. Both legs are now swelling. Nurse Assessment Guidelines Guideline Title Affirmed Question Affirmed Notes Nurse Date/Time (Eastern Time) Disp. Time Eilene Ghazi Time) Disposition Final User 09/14/2014 9:29:09 AM Clinical Call Yes Mechele Dawley, RN, [facility] Instructions Given Call Event Type User Date / Time Description Comments User: Susanne Borders, RN Date/Time Eilene Ghazi Time): 09/14/2014 9:28:54 AM PATIENTS DAUGHTER HAS CALLED ABOUT HER MOTHER AND IS QUITE UPSET AS TO HOW THINGS ARE GOING ON FOR HER MOTHER IN THE FACILITY THAT SHE IS IN. SHE STATES THAT THE FACILITY HAS CHANGED HER MEDIATIONS ALL AROUND - TAKEN HER OFF OF SOME OF HER MEDS AND INCREASED SOME OF HER OTHERS. SHE STATES THAT SHE HAS A WOUND ON HER LEG THAT IS ABOUT 2X4 AND THAT SHE IS HAVING AN SIGNIFICANT AMOUNT OF SWELLING TO BOTH OF HER LEGS. SHE STATES THAT THE WOUND HAS BEEN THERE AND SHE WAS PUT ON AMOXICILLIN ORIGINALLY FOR IT AND THEN THEY FINALLY DID A CULTURE ON IT AND SHE IS NOW ON BACTRIM. SHE STATES THAT SHE IS HAVING THE SWELLING IN THE LEGS SO MUCH THAT SHE HAD TO GET HER MOM A NEW SIZE OF PANTS TO WEAR. SHE STATES THAT SHE IS SCHEDULED FOR A PROCEDURE TO HAVE DONE NEXT WEEK AND THE FAMILY IS HIGHLY CONCERNED ABOUT THIS DUE TO THE ENORMOUS  AMOUNT OF SWELLING THAT SHE IS HAVING. DAUGHTER STATES THAT SHE WANTS TO KNOW IF DR. SCOTT COULD GO AND SEE HER MOTHER AT THE FACILITY. INFORMED HER THAT IT WOULD BE UP TO DR. Nicki Reaper IF SHE HAS VISITING PRIVILEGES AT THE FACILITY AND IF SHE WOULD WISH TO DO SO. INFORMED HER THAT DR. SCOTT WOULD HAVE TO MAKE THAT DECISION THAT WOULD SEND THIS INFORMATION OVER TO MD AND WOULD LET HER KNOW WHAT WAS GOING ON WITH HER MOM AND THAT IT WAS SUGGESTED TO HER THAT THE MD THAT IS OVER THE FACILITY IS THE ONE THAT NEEDS TO TAKE CARE OF HER MOM WITH ORDERING THE APPROPRIATE MEDS FOR HER MOM WHILE SHE IS THERE. SHE STATES THAT SHE HAS PUT A CALL INTO THE FACILITY FOR THE MD TO CALL HER AND THAT SHE HAS NOT RECEIVED THE CALL AS OF YET THIS MORNING. SHE STATES THAT SHE WILL WAIT AND SEE IF THE MD CALLS. SHE STATES THAT SHE CALLED THIS MORNING AND HAD TO TELL THEM THAT SHE IS GOING TO CALL ADMINISTRATION IN ORDER TO HAVE THE NURSE TO CALL. SHE STATES PLEASE NOTE: All timestamps contained within this report are represented as [place] Standard Time. CONFIDENTIALTY NOTICE: This fax transmission is intended only for the addressee. It contains information that is legally privileged, confidential or otherwise protected from use or disclosure. If you are not the intended recipient, you are strictly prohibited from reviewing, disclosing, copying using or disseminating any of this information or taking any action in reliance on or regarding this information. If you have received this fax in  error, please notify us immediately by telephone so that we can arrange for its return to [place]. Phone: [phone], Toll-Free: [phone], Fax: [phone] Page: 2 of 2 Call Id: [id number] Comments THAT SHE IS UPSET AS TO WHY THEY HAVE WAITED SO LONG ON THIS WOUND BEFORE THEY HAVE DONE ANYTHING FOR HER MOM TO WAIT BEFORE CORRECT ANTIBIOTICS HAVE BEEN ORDERED AND TO HAVE THE PROCEDURE DONE.

## 2014-09-14 NOTE — Telephone Encounter (Signed)
If increased swelling and a wound that is not healing - the MD at the facility needs to evaluate and address.  If acute issues, to ER.

## 2014-09-14 NOTE — Telephone Encounter (Signed)
Left detailed message on voicemail.  

## 2014-09-16 ENCOUNTER — Other Ambulatory Visit
Admission: RE | Admit: 2014-09-16 | Discharge: 2014-09-16 | Disposition: A | Discharge status: Home / Self Care | Payer: Medicare Other | Source: Ambulatory Visit | Attending: Family Medicine | Admitting: Family Medicine

## 2014-09-16 DIAGNOSIS — I10 Essential (primary) hypertension: Secondary | ICD-10-CM | POA: Insufficient documentation

## 2014-09-16 DIAGNOSIS — E119 Type 2 diabetes mellitus without complications: Secondary | ICD-10-CM | POA: Insufficient documentation

## 2014-09-16 DIAGNOSIS — I4891 Unspecified atrial fibrillation: Secondary | ICD-10-CM | POA: Diagnosis present

## 2014-09-16 LAB — BASIC METABOLIC PANEL
ANION GAP: 9 (ref 5–15)
BUN: 17 mg/dL (ref 6–20)
CALCIUM: 8.8 mg/dL — AB (ref 8.9–10.3)
CO2: 25 mmol/L (ref 22–32)
CREATININE: 0.96 mg/dL (ref 0.44–1.00)
Chloride: 91 mmol/L — ABNORMAL LOW (ref 101–111)
GFR calc Af Amer: 59 mL/min — ABNORMAL LOW (ref >60)
GFR, EST NON AFRICAN AMERICAN: 51 mL/min — AB (ref >60)
Glucose, Bld: 138 mg/dL — ABNORMAL HIGH (ref 65–99)
Potassium: 4.9 mmol/L (ref 3.5–5.1)
Sodium: 125 mmol/L — ABNORMAL LOW (ref 135–145)

## 2014-09-17 ENCOUNTER — Encounter: Payer: Medicare Other | Attending: Surgery | Admitting: Surgery

## 2014-09-17 DIAGNOSIS — I4891 Unspecified atrial fibrillation: Secondary | ICD-10-CM | POA: Diagnosis not present

## 2014-09-17 DIAGNOSIS — I70248 Atherosclerosis of native arteries of left leg with ulceration of other part of lower left leg: Secondary | ICD-10-CM | POA: Insufficient documentation

## 2014-09-17 DIAGNOSIS — E119 Type 2 diabetes mellitus without complications: Secondary | ICD-10-CM | POA: Diagnosis not present

## 2014-09-17 DIAGNOSIS — L97222 Non-pressure chronic ulcer of left calf with fat layer exposed: Secondary | ICD-10-CM | POA: Insufficient documentation

## 2014-09-17 DIAGNOSIS — I10 Essential (primary) hypertension: Secondary | ICD-10-CM | POA: Insufficient documentation

## 2014-09-17 DIAGNOSIS — L03116 Cellulitis of left lower limb: Secondary | ICD-10-CM | POA: Insufficient documentation

## 2014-09-17 DIAGNOSIS — I2510 Atherosclerotic heart disease of native coronary artery without angina pectoris: Secondary | ICD-10-CM | POA: Insufficient documentation

## 2014-09-17 NOTE — Progress Notes (Addendum)
Kara, [name] ([id number]) Visit Report for 09/17/2014 Arrival Information Details Patient Name: [name], [name]. Date of Service: 09/17/2014 9:30 AM Medical Record Number: [id number] Patient Account Number: [id number] Date of Birth/Sex: 04-14-1922 (81 y.o. Female) Treating RN: Afful, RN, BSN, Velva Harman Primary Care Physician: Einar Pheasant Other Clinician: Referring Physician: Einar Pheasant Treating Physician/Extender: Frann Rider in Treatment: 1 Visit Information History Since Last Visit Any new allergies or adverse reactions: No Patient Arrived: Wheel Chair Had a fall or experienced change in No Arrival Time: 09:18 activities of daily [name] that may affect Accompanied By: caregiver risk of falls: Transfer Assistance: EasyPivot Patient Signs or symptoms of abuse/neglect since last No Lift visito Patient Identification Verified: Yes Hospitalized since last visit: No Secondary Verification Process Yes Has Dressing in Place as Prescribed: Yes Completed: Pain Present Now: No Patient Requires Transmission- No Based Precautions: Patient Has Alerts: Yes Patient Alerts: Patient on Blood Thinner Eliquis. R ABI:0.96 Electronic Signature(s) Signed: 09/17/2014 9:51:58 AM By: Regan Lemming BSN, RN Previous Signature: 09/17/2014 9:19:24 AM Version By: Regan Lemming BSN, RN Entered By: Regan Lemming on 09/17/2014 09:51:58 [name] ([id number]) -------------------------------------------------------------------------------- Clinic Level of Care Assessment Details Patient Name: [name]. Date of Service: 09/17/2014 9:30 AM Medical Record Number: [id number] Patient Account Number: [id number] Date of Birth/Sex: 1922-09-17 (81 y.o. Female) Treating RN: Afful, RN, BSN, Velva Harman Primary Care Physician: Einar Pheasant Other Clinician: Referring Physician: Einar Pheasant Treating Physician/Extender: Frann Rider in Treatment: 1 Clinic Level of Care Assessment Items TOOL  4 Quantity Score []  - Use when only an EandM is performed on FOLLOW-UP visit 0 ASSESSMENTS - Nursing Assessment / Reassessment X - Reassessment of Co-morbidities (includes updates in patient status) 1 10 X - Reassessment of Adherence to Treatment Plan 1 5 ASSESSMENTS - Wound and Skin Assessment / Reassessment X - Simple Wound Assessment / Reassessment - one wound 1 5 []  - Complex Wound Assessment / Reassessment - multiple wounds 0 []  - Dermatologic / Skin Assessment (not related to wound area) 0 ASSESSMENTS - Focused Assessment []  - Circumferential Edema Measurements - multi extremities 0 []  - Nutritional Assessment / Counseling / Intervention 0 X - Lower Extremity Assessment (monofilament, tuning fork, pulses) 1 5 []  - Peripheral Arterial Disease Assessment (using hand held doppler) 0 ASSESSMENTS - Ostomy and/or Continence Assessment and Care []  - Incontinence Assessment and Management 0 []  - Ostomy Care Assessment and Management (repouching, etc.) 0 PROCESS - Coordination of Care X - Simple Patient / Family Education for ongoing care 1 15 []  - Complex (extensive) Patient / Family Education for ongoing care 0 []  - Staff obtains Programmer, systems, Records, Test Results / Process Orders 0 []  - Staff telephones HHA, Nursing Homes / Clarify orders / etc 0 []  - Routine Transfer to another Facility (non-emergent condition) 0 Mol, Benton ([id number]) []  - Routine Hospital Admission (non-emergent condition) 0 []  - New Admissions / Biomedical engineer / Ordering NPWT, Apligraf, etc. 0 []  - Emergency Hospital Admission (emergent condition) 0 X - Simple Discharge Coordination 1 10 []  - Complex (extensive) Discharge Coordination 0 PROCESS - Special Needs []  - Pediatric / Minor Patient Management 0 []  - Isolation Patient Management 0 []  - Hearing / Language / Visual special needs 0 []  - Assessment of Community assistance (transportation, D/C planning, etc.) 0 []  - Additional assistance /  Altered mentation 0 []  - Support Surface(s) Assessment (bed, cushion, seat, etc.) 0 INTERVENTIONS - Wound Cleansing / Measurement X - Simple Wound Cleansing - one wound 1  5 []  - Complex Wound Cleansing - multiple wounds 0 X - Wound Imaging (photographs - any number of wounds) 1 5 []  - Wound Tracing (instead of photographs) 0 X - Simple Wound Measurement - one wound 1 5 []  - Complex Wound Measurement - multiple wounds 0 INTERVENTIONS - Wound Dressings X - Small Wound Dressing one or multiple wounds 1 10 []  - Medium Wound Dressing one or multiple wounds 0 []  - Large Wound Dressing one or multiple wounds 0 []  - Application of Medications - topical 0 []  - Application of Medications - injection 0 INTERVENTIONS - Miscellaneous []  - External ear exam 0 Desanctis, Locklyn L. ([id number]) []  - Specimen Collection (cultures, biopsies, blood, body fluids, etc.) 0 []  - Specimen(s) / Culture(s) sent or taken to Lab for analysis 0 []  - Patient Transfer (multiple staff / Harrel Lemon Lift / Similar devices) 0 []  - Simple Staple / Suture removal (25 or less) 0 []  - Complex Staple / Suture removal (26 or more) 0 []  - Hypo / Hyperglycemic Management (close monitor of Blood Glucose) 0 []  - Ankle / Brachial Index (ABI) - do not check if billed separately 0 X - Vital Signs 1 5 Has the patient been seen at the hospital within the last three years: Yes Total Score: 80 Level Of Care: New/Established - Level 3 Electronic Signature(s) Signed: 09/17/2014 3:44:26 PM By: Regan Lemming BSN, RN Entered By: Regan Lemming on 09/17/2014 15:26:16 [name] ([id number]) -------------------------------------------------------------------------------- Encounter Discharge Information Details Patient Name: [name]. Date of Service: 09/17/2014 9:30 AM Medical Record Number: [id number] Patient Account Number: [id number] Date of Birth/Sex: 07/20/1922 (81 y.o. Female) Treating RN: Afful, RN, BSN, Velva Harman Primary Care Physician:  Einar Pheasant Other Clinician: Referring Physician: Einar Pheasant Treating Physician/Extender: Frann Rider in Treatment: 1 Encounter Discharge Information Items Discharge Pain Level: 0 Discharge Condition: Stable Ambulatory Status: Wheelchair Discharge Destination: Home Transportation: Other dtr, Accompanied By: caregiver Schedule Follow-up Appointment: No Medication Reconciliation completed and No provided to Patient/Care Fortunato Nordin: Clinical Summary of Care: Electronic Signature(s) Signed: 09/17/2014 3:44:26 PM By: Regan Lemming BSN, RN Entered By: Regan Lemming on 09/17/2014 09:59:36 [name] ([id number]) -------------------------------------------------------------------------------- Lower Extremity Assessment Details Patient Name: [name] Date of Service: 09/17/2014 9:30 AM Medical Record Number: [id number] Patient Account Number: [id number] Date of Birth/Sex: 1922-08-03 (81 y.o. Female) Treating RN: Afful, RN, BSN, Velva Harman Primary Care Physician: Einar Pheasant Other Clinician: Referring Physician: Einar Pheasant Treating Physician/Extender: Frann Rider in Treatment: 1 Edema Assessment Assessed: [Left: No] [Right: No] E[Left: dema] [Right: :] Calf Left: Right: Point of Measurement: 37 cm From Medial Instep 38 cm cm Ankle Left: Right: Point of Measurement: 12 cm From Medial Instep 24 cm cm Vascular Assessment Pulses: Posterior Tibial Dorsalis Pedis Palpable: [Left:Yes] Extremity colors, hair growth, and conditions: Extremity Color: [Left:Mottled] Hair Growth on Extremity: [Left:Yes] Temperature of Extremity: [Left:Warm] Capillary Refill: [Left:< 3 seconds] Toe Nail Assessment Left: Right: Thick: Yes Discolored: Yes Deformed: Yes Improper Length and Hygiene: Yes Electronic Signature(s) Signed: 09/17/2014 9:28:22 AM By: Regan Lemming BSN, RN Entered By: Regan Lemming on 09/17/2014 83:66:29 [name]  ([id number]) -------------------------------------------------------------------------------- Multi Wound Chart Details Patient Name: [name]. Date of Service: 09/17/2014 9:30 AM Medical Record Number: [id number] Patient Account Number: [id number] Date of Birth/Sex: [birth year] (81 y.o. Female) Treating RN: Baruch Gouty, RN, BSN, Velva Harman Primary Care Physician: Einar Pheasant Other Clinician: Referring Physician: Einar Pheasant Treating Physician/Extender: Frann Rider in Treatment: 1 Vital Signs Height(in): 67 Pulse(bpm): 74 Weight(lbs): 159  Blood Pressure 151/54 (mmHg): Body Mass Index(BMI): 25 Temperature(F): 97.6 Respiratory Rate 18 (breaths/min): Photos: [1:No Photos] [2:No Photos] [N/A:N/A] Wound Location: [1:Left Lower Leg - Lateral] [2:Left Lower Leg - Anterior] [N/A:N/A] Wounding Event: [1:Gradually Appeared] [2:Gradually Appeared] [N/A:N/A] Primary Etiology: [1:Venous Leg Ulcer] [2:Cellulitis] [N/A:N/A] Secondary Etiology: [1:Cellulitis] [2:N/A] [N/A:N/A] Comorbid History: [1:Cataracts, Arrhythmia, Hypertension, Peripheral Arterial Disease, Peripheral Venous Disease, Osteoarthritis, Confinement Anxiety] [2:Cataracts, Arrhythmia, Hypertension, Peripheral Arterial Disease, Peripheral Venous Disease,  Osteoarthritis, Confinement Anxiety] [N/A:N/A] Date Acquired: [1:08/17/2014] [2:09/07/2014] [N/A:N/A] Weeks of Treatment: [1:1] [2:1] [N/A:N/A] Wound Status: [1:Open] [2:Healed - Epithelialized] [N/A:N/A] Measurements L x W x D 8x3.5x0.2 [2:0x0x0] [N/A:N/A] (cm) Area (cm) : [1:21.991] [2:0] [N/A:N/A] Volume (cm) : [1:4.398] [2:0] [N/A:N/A] % Reduction in Area: [1:16.70%] [2:100.00%] [N/A:N/A] % Reduction in Volume: 16.70% [2:100.00%] [N/A:N/A] Classification: [1:Full Thickness Without Exposed Support Structures] [2:Partial Thickness] [N/A:N/A] Exudate Amount: [1:Medium] [2:None Present] [N/A:N/A] Exudate Type: [1:Serosanguineous] [2:N/A] [N/A:N/A] Exudate Color:  [1:red, brown] [2:N/A] [N/A:N/A] Wound Margin: [1:Distinct, outline attached] [2:Distinct, outline attached] [N/A:N/A] Granulation Amount: [1:Medium (34-66%)] [2:Medium (34-66%)] [N/A:N/A] Granulation Quality: [1:Red, Pink] [2:Pale] [N/A:N/A] Necrotic Amount: [1:Medium (34-66%)] [2:None Present (0%)] [N/A:N/A] Exposed Structures: Fascia: No Fascia: No N/A Fat: No Fat: No Tendon: No Tendon: No Muscle: No Muscle: No Joint: No Joint: No Bone: No Bone: No Limited to Skin Limited to Skin Breakdown Breakdown Epithelialization: Small (1-33%) Large (67-100%) N/A Periwound Skin Texture: Edema: Yes Edema: Yes N/A Excoriation: No Rash: Yes Induration: No Excoriation: No Callus: No Induration: No Crepitus: No Callus: No Fluctuance: No Crepitus: No Friable: No Fluctuance: No Rash: No Friable: No Scarring: No Scarring: No Periwound Skin Moist: Yes Dry/Scaly: Yes N/A Moisture: Maceration: No Maceration: No Dry/Scaly: No Moist: No Periwound Skin Color: Mottled: Yes Mottled: Yes N/A Atrophie Blanche: No Atrophie Blanche: No Cyanosis: No Cyanosis: No Ecchymosis: No Ecchymosis: No Erythema: No Erythema: No Hemosiderin Staining: No Hemosiderin Staining: No Pallor: No Pallor: No Rubor: No Rubor: No Temperature: No Abnormality No Abnormality N/A Tenderness on Yes No N/A Palpation: Wound Preparation: Ulcer Cleansing: Ulcer Cleansing: N/A Rinsed/Irrigated with Rinsed/Irrigated with Saline Saline Topical Anesthetic Topical Anesthetic Applied: Other: lidocaine Applied: None 4% Treatment Notes Electronic Signature(s) Signed: 09/17/2014 9:34:32 AM By: Regan Lemming BSN, RN Entered By: Regan Lemming on 09/17/2014 [place]ouck, Fidelis L. ([id number]) -------------------------------------------------------------------------------- [place] Details Patient Name: [name] Date of Service: 09/17/2014 9:30 AM Medical Record Number:  [id number] Patient Account Number: [id number] Date of Birth/Sex: 02-11-1923 (81 y.o. Female) Treating RN: Afful, RN, BSN, Velva Harman Primary Care Physician: Einar Pheasant Other Clinician: Referring Physician: Einar Pheasant Treating Physician/Extender: Frann Rider in Treatment: 1 Active Inactive Abuse / Safety / Falls / Self Care Management Nursing Diagnoses: Potential for falls Goals: Patient will remain injury free Date Initiated: 09/07/2014 Goal Status: Active Interventions: Assess fall risk on admission and as needed Notes: Nutrition Nursing Diagnoses: Potential for alteratiion in Nutrition/Potential for imbalanced nutrition Goals: Patient/caregiver agrees to and verbalizes understanding of need to use nutritional supplements and/or vitamins as prescribed Date Initiated: 09/07/2014 Goal Status: Active Interventions: Assess patient nutrition upon admission and as needed per policy Notes: Orientation to the Wound Care Program Nursing Diagnoses: Knowledge deficit related to the wound healing center program Goals: Patient/caregiver will verbalize understanding of the [place], Lamiracle L. ([id number]) Date Initiated: 09/07/2014 Goal Status: Active Interventions: Provide education on orientation to the wound center Notes: Wound/Skin Impairment Nursing Diagnoses: Impaired tissue integrity Goals: Patient/caregiver will verbalize understanding of skin care regimen Date Initiated: 09/07/2014 Goal Status: Active Ulcer/skin  breakdown will heal within 14 weeks Date Initiated: 09/07/2014 Goal Status: Active Interventions: Assess patient/caregiver ability to obtain necessary supplies Assess ulceration(s) every visit Notes: Electronic Signature(s) Signed: 09/17/2014 9:32:08 AM By: Regan Lemming BSN, RN Entered By: Regan Lemming on 09/17/2014 09:32:08 [name]  ([id number]) -------------------------------------------------------------------------------- Pain Assessment Details Patient Name: [name]. Date of Service: 09/17/2014 9:30 AM Medical Record Number: [id number] Patient Account Number: [id number] Date of Birth/Sex: [birth year] (81 y.o. Female) Treating RN: Baruch Gouty, RN, BSN, Velva Harman Primary Care Physician: Einar Pheasant Other Clinician: Referring Physician: Einar Pheasant Treating Physician/Extender: Frann Rider in Treatment: 1 Active Problems Location of Pain Severity and Description of Pain Patient Has Paino No Site Locations Pain Management and Medication Current Pain Management: Electronic Signature(s) Signed: 09/17/2014 9:19:32 AM By: Regan Lemming BSN, RN Entered By: Regan Lemming on 09/17/2014 09:19:32 [name] ([id number]) -------------------------------------------------------------------------------- Patient/Caregiver Education Details Patient Name: [name] Date of Service: 09/17/2014 9:30 AM Medical Record Number: [id number] Patient Account Number: [id number] Date of Birth/Gender: February 23, 1922 (81 y.o. Female) Treating RN: Baruch Gouty, RN, BSN, Velva Harman Primary Care Physician: Einar Pheasant Other Clinician: Referring Physician: Einar Pheasant Treating Physician/Extender: Frann Rider in Treatment: 1 Education Assessment Education Provided To: Patient and Caregiver Education Topics Provided Welcome To [facility]: Methods: Explain/Verbal Responses: State content correctly Wound/Skin Impairment: Methods: Explain/Verbal Responses: State content correctly Electronic Signature(s) Signed: 09/17/2014 3:44:26 PM By: Regan Lemming BSN, RN Entered By: Regan Lemming on 09/17/2014 10:03:58 [name] ([id number]) -------------------------------------------------------------------------------- Wound Assessment Details Patient Name: [name]. Date of Service: 09/17/2014 9:30 AM Medical  Record Number: [id number] Patient Account Number: [id number] Date of Birth/Sex: 11-Nov-1922 (81 y.o. Female) Treating RN: Afful, RN, BSN, [place] Primary Care Physician: Einar Pheasant Other Clinician: Referring Physician: Einar Pheasant Treating Physician/Extender: Frann Rider in Treatment: 1 Wound Status Wound Number: 1 Primary Venous Leg Ulcer Etiology: Wound Location: Left Lower Leg - Lateral Secondary Cellulitis Wounding Event: Gradually Appeared Etiology: Date Acquired: 08/17/2014 Wound Open Weeks Of Treatment: 1 Status: Clustered Wound: No Comorbid Cataracts, Arrhythmia, Hypertension, History: Peripheral Arterial Disease, Peripheral Venous Disease, Osteoarthritis, Confinement Anxiety Photos Photo Uploaded By: Regan Lemming on 09/17/2014 15:43:56 Wound Measurements Length: (cm) 8 Width: (cm) 3.5 Depth: (cm) 0.2 Area: (cm) 21.991 Volume: (cm) 4.398 % Reduction in Area: 16.7% % Reduction in Volume: 16.7% Epithelialization: Small (1-33%) Tunneling: No Undermining: No Wound Description Full Thickness Without Exposed Classification: Support Structures Wound Margin: Distinct, outline attached Exudate Medium Amount: Exudate Type: Serosanguineous Exudate Color: red, brown Seevers, Fallon L. ([id number]) Foul Odor After Cleansing: No Wound Bed Granulation Amount: Medium (34-66%) Exposed Structure Granulation Quality: Red, Pink Fascia Exposed: No Necrotic Amount: Medium (34-66%) Fat Layer Exposed: No Necrotic Quality: Adherent Slough Tendon Exposed: No Muscle Exposed: No Joint Exposed: No Bone Exposed: No Limited to Skin Breakdown Periwound Skin Texture Texture Color No Abnormalities Noted: No No Abnormalities Noted: No Callus: No Atrophie Blanche: No Crepitus: No Cyanosis: No Excoriation: No Ecchymosis: No Fluctuance: No Erythema: No Friable: No Hemosiderin Staining: No Induration: No Mottled: Yes Localized Edema: Yes Pallor: No Rash:  No Rubor: No Scarring: No Temperature / Pain Moisture Temperature: No Abnormality No Abnormalities Noted: No Tenderness on Palpation: Yes Dry / Scaly: No Maceration: No Moist: Yes Wound Preparation Ulcer Cleansing: Rinsed/Irrigated with Saline Topical Anesthetic Applied: Other: lidocaine 4%, Treatment Notes Wound #1 (Left, Lateral Lower Leg) 1. Cleansed with: Clean wound with Normal Saline 4. Dressing Applied: Santyl Ointment 5. Secondary Dressing Applied Guaze, ABD and kerlix/Conform Electronic Signature(s)  Signed: 09/17/2014 9:29:12 AM By: Regan Lemming BSN, RN Entered By: Regan Lemming on 09/17/2014 [place], [place]. ([id number]) -------------------------------------------------------------------------------- Wound Assessment Details Patient Name: [name]. Date of Service: 09/17/2014 9:30 AM Medical Record Number: [id number] Patient Account Number: [id number] Date of Birth/Sex: 1922-08-04 (81 y.o. Female) Treating RN: Afful, RN, BSN, [place] Primary Care Physician: Einar Pheasant Other Clinician: Referring Physician: Einar Pheasant Treating Physician/Extender: Frann Rider in Treatment: 1 Wound Status Wound Number: 2 Primary Cellulitis Etiology: Wound Location: Left Lower Leg - Anterior Wound Healed - Epithelialized Wounding Event: Gradually Appeared Status: Date Acquired: 09/07/2014 Comorbid Cataracts, Arrhythmia, Hypertension, Weeks Of Treatment: 1 History: Peripheral Arterial Disease, Peripheral Clustered Wound: No Venous Disease, Osteoarthritis, Confinement Anxiety Photos Photo Uploaded By: Regan Lemming on 09/17/2014 15:43:57 Wound Measurements Length: (cm) 0 % Reducti Width: (cm) 0 % Reducti Depth: (cm) 0 Epithelia Area: (cm) 0 Volume: (cm) 0 on in Area: 100% on in Volume: 100% lization: Large (67-100%) Wound Description Classification: Partial Thickness Wound Margin: Distinct, outline attached Exudate Amount: None Present Foul  Odor After Cleansing: No Wound Bed Granulation Amount: Medium (34-66%) Exposed Structure Granulation Quality: Pale Fascia Exposed: No Necrotic Amount: None Present (0%) Fat Layer Exposed: No Tendon Exposed: No Muscle Exposed: No Bracewell, Kindal L. ([id number]) Joint Exposed: No Bone Exposed: No Limited to Skin Breakdown Periwound Skin Texture Texture Color No Abnormalities Noted: No No Abnormalities Noted: No Callus: No Atrophie Blanche: No Crepitus: No Cyanosis: No Excoriation: No Ecchymosis: No Fluctuance: No Erythema: No Friable: No Hemosiderin Staining: No Induration: No Mottled: Yes Localized Edema: Yes Pallor: No Rash: Yes Rubor: No Scarring: No Temperature / Pain Moisture Temperature: No Abnormality No Abnormalities Noted: No Dry / Scaly: Yes Maceration: No Moist: No Wound Preparation Ulcer Cleansing: Rinsed/Irrigated with Saline Topical Anesthetic Applied: None Electronic Signature(s) Signed: 09/17/2014 9:29:42 AM By: Regan Lemming BSN, RN Entered By: Regan Lemming on 09/17/2014 09:29:41 [name] ([id number]) -------------------------------------------------------------------------------- Vitals Details Patient Name: [name]. Date of Service: 09/17/2014 9:30 AM Medical Record Number: [id number] Patient Account Number: [id number] Date of Birth/Sex: [birth year] (81 y.o. Female) Treating RN: Afful, RN, BSN, Velva Harman Primary Care Physician: Einar Pheasant Other Clinician: Referring Physician: Einar Pheasant Treating Physician/Extender: Frann Rider in Treatment: 1 Vital Signs Time Taken: 09:20 Temperature (F): 97.6 Height (in): 67 Pulse (bpm): 74 Weight (lbs): 159 Respiratory Rate (breaths/min): 18 Body Mass Index (BMI): 24.9 Blood Pressure (mmHg): 151/54 Reference Range: 80 - 120 mg / dl Electronic Signature(s) Signed: 09/17/2014 9:27:10 AM By: Regan Lemming BSN, RN Entered By: Regan Lemming on 09/17/2014 09:27:10

## 2014-09-17 NOTE — Progress Notes (Addendum)
DEBBI, STRANDBERG ([id number]) Visit Report for 09/17/2014 Chief Complaint Document Details Patient Name: [name], [name]. Date of Service: 09/17/2014 9:30 AM Medical Record Number: [id number] Patient Account Number: [id number] Date of Birth/Sex: [birth year] (81 y.o. Female) Treating RN: Primary Care Physician: Einar Pheasant Other Clinician: Referring Physician: Einar Pheasant Treating Physician/Extender: Frann Rider in Treatment: 1 Information Obtained from: Patient Chief Complaint Patient presents to the wound care center today with an open arterial ulcer. The patient has had left lower extremity ulceration for about 1 month. Electronic Signature(s) Signed: 09/17/2014 9:43:36 AM By: Christin Fudge MD, FACS Entered By: Christin Fudge on 09/17/2014 09:43:36 [name] ([id number]) -------------------------------------------------------------------------------- HPI Details Patient Name: [name] Date of Service: 09/17/2014 9:30 AM Medical Record Number: [id number] Patient Account Number: [id number] Date of Birth/Sex: Aug 06, 1922 (81 y.o. Female) Treating RN: Primary Care Physician: Einar Pheasant Other Clinician: Referring Physician: Einar Pheasant Treating Physician/Extender: Frann Rider in Treatment: 1 History of Present Illness Location: left lower extremity ulceration Quality: Patient reports experiencing burning to affected area(s). Severity: Patient states wound are getting worse. Duration: Patient has had the wound for < 4 weeks prior to presenting for treatment Timing: Pain in wound is constant (hurts all the time) Context: The wound appeared gradually over time Modifying Factors: Other treatment(s) tried include:has been on oral anti-buttocks since the beginning of the month and had 10 days of Augmentin and now is on doxycycline. Associated Signs and Symptoms: Patient reports having difficulty standing for long periods. HPI Description:  81 year old patient was admitted to [facility] between 08/13/2014 and 08/17/2014 with cellulitis of the left lower extremity.duplex study done then showed no DVT and the patient was on IV vancomycin and Rocephin and also received Unasyn later. The patient was sent home on 10 days of Augmentin. Her other comorbidities of atrial fibrillation, essential hypertension, diabetes mellitus type 2, coronary artery disease were managed with appropriate medications. on 09/04/2014 she was seen in the ER and was found to have a 10 cm ulceration on the left lower extremity lateral and superior to the lateral malleolus. The granulation tissue was noted and she was referred to the wound center. the ER notes that her ABIs were within normal limits and she was sent home on consultative management. a duplex scan of the lower extremities was done by the nursing home and this showed that the right ABI was 0.96 with a multiphasic waveform throughout the right leg. There was a monophasic waveform of the left leg suggesting left external iliac artery stenosis with blunting distally and the ABI could not be calculated on the left side. Moderate to severe ischemia was suspected of the left leg and a MR angiogram was recommended. 09/17/2014 -- Xray Left tibia and fibula --- unremarkable tibia and fibula Her wound culture is back and this has grown a heavy growth of staph aureus sensitive to ciprofloxacin and clindamycin and Bactrim. She has been on doxycycline since July 14 for 10 days and we will review her clinically before switching her antibiotics if need be. After reviewing her chart she is not going to be here till 09/17/2014 and hence I will call in Bactrim DS 1 twice a day for 14 days. She has seen Dr. Lucky Cowboy for an opinion and she is scheduled a procedure on this coming Thursday. other than that the pain and swelling continues and she has no other acute problems. Electronic  Signature(s) Signed: 09/17/2014 9:44:27 AM By: Christin Fudge MD, FACS Previous Signature: 09/17/2014 9:10:53  AM Version By: Christin Fudge MD, FACS [name], [name] ([id number]) Entered By: Christin Fudge on 09/17/2014 09:44:26 [name] ([id number]) -------------------------------------------------------------------------------- Physical Exam Details Patient Name: [name]. Date of Service: 09/17/2014 9:30 AM Medical Record Number: [id number] Patient Account Number: [id number] Date of Birth/Sex: July 28, 1922 (81 y.o. Female) Treating RN: Primary Care Physician: Einar Pheasant Other Clinician: Referring Physician: Einar Pheasant Treating Physician/Extender: Frann Rider in Treatment: 1 Constitutional . Pulse regular. Respirations normal and unlabored. Afebrile. . Eyes Nonicteric. Reactive to light. Ears, Nose, Mouth, and Throat Lips, teeth, and gums WNL.Marland Kitchen Moist mucosa without lesions . Neck supple and nontender. No palpable supraclavicular or cervical adenopathy. Normal sized without goiter. Respiratory WNL. No retractions.. Cardiovascular Pedal Pulses Doppler signals present monophasic and ABI could not be measured.. She has edema of the left lower extremity. Lymphatic No adneopathy. No adenopathy. No adenopathy. Musculoskeletal Adexa without tenderness or enlargement.. Digits and nails w/o clubbing, cyanosis, infection, petechiae, ischemia, or inflammatory conditions.. Integumentary (Hair, Skin) No suspicious lesions. No crepitus or fluctuance. No peri-wound warmth or erythema. No masses.Marland Kitchen Psychiatric Judgement and insight Intact.. No evidence of depression, anxiety, or agitation.. Notes The medial ulcer has completely healed but the lateral one continues to be tenderness (we will use Santyl ointment locally.) Electronic Signature(s) Signed: 09/17/2014 9:45:36 AM By: Christin Fudge MD, FACS Entered By: Christin Fudge on 09/17/2014 09:45:36 [name]  ([id number]) -------------------------------------------------------------------------------- Physician Orders Details Patient Name: [name]. Date of Service: 09/17/2014 9:30 AM Medical Record Patient Account Number: [id number] Number: Afful, RN, BSN, Treating RN: 1922-11-10 (81 y.o. Velva Harman Date of Birth/Sex: Female) Other Clinician: Primary Care Physician: Einar Pheasant Treating Christin Fudge Referring Physician: Einar Pheasant Physician/Extender: Suella Grove in Treatment: 1 Verbal / Phone Orders: Yes Clinician: Afful, RN, BSN, Rita Read Back and Verified: Yes Diagnosis Coding Wound Cleansing Wound #1 Left,Lateral Lower Leg o Clean wound with wound cleanser. Anesthetic Wound #1 Left,Lateral Lower Leg o Topical Lidocaine 4% cream applied to wound bed prior to debridement Primary Wound Dressing Wound #1 Left,Lateral Lower Leg o Santyl Ointment Secondary Dressing Wound #1 Left,Lateral Lower Leg o Conform/Kerlix o Non-adherent pad Dressing Change Frequency Wound #1 Left,Lateral Lower Leg o Change dressing every day. Follow-up Appointments Wound #1 Left,Lateral Lower Leg o Return Appointment in 1 week. Additional Orders / Instructions Wound #1 Left,Lateral Lower Leg o Increase protein intake. Electronic Signature(s) Signed: 09/17/2014 9:40:30 AM By: Regan Lemming BSN, RN Signed: 09/17/2014 12:31:06 PM By: Christin Fudge MD, FACS JEFFORDS, [name] ([id number]) Entered By: Regan Lemming on 09/17/2014 09:40:30 [name] ([id number]) -------------------------------------------------------------------------------- Problem List Details Patient Name: [name], [name]. Date of Service: 09/17/2014 9:30 AM Medical Record Number: [id number] Patient Account Number: [id number] Date of Birth/Sex: 1922/03/21 (81 y.o. 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 Date of Service: 09/17/2014 9:30 AM Medical Record Number: [id number] Patient Account Number: [id number] Date of Birth/Sex: 07-Mar-1922 (81 y.o. Female) Treating RN: Primary  Care Physician: Einar Pheasant Other Clinician: Referring Physician: Einar Pheasant Treating Physician/Extender: Frann Rider in Treatment: 1 Subjective Chief Complaint Information obtained from Patient Patient presents to the wound care center today with an open arterial ulcer. The patient has had left lower extremity ulceration for about 1 month. History of Present Illness (HPI) The following HPI elements were documented for the patient's wound: Location: left lower extremity ulceration Quality: Patient reports experiencing burning to affected area(s). Severity: Patient states wound are getting worse. Duration: Patient has had the wound for < 4 weeks prior to presenting for treatment Timing: Pain in wound is constant (hurts all the time) Context: The wound appeared gradually over time Modifying Factors: Other treatment(s) tried include:has been on oral anti-buttocks since the beginning of the month and had 10 days of Augmentin and now is on doxycycline. Associated Signs and Symptoms: Patient reports having difficulty  standing for long periods. 81 year old patient was admitted to [facility] between 08/13/2014 and 08/17/2014 with cellulitis of the left lower extremity.duplex study done then showed no DVT and the patient was on IV vancomycin and Rocephin and also received Unasyn later. The patient was sent home on 10 days of Augmentin. Her other comorbidities of atrial fibrillation, essential hypertension, diabetes mellitus type 2, coronary artery disease were managed with appropriate medications. on 09/04/2014 she was seen in the ER and was found to have a 10 cm ulceration on the left lower extremity lateral and superior to the lateral malleolus. The granulation tissue was noted and she was referred to the wound center. the ER notes that her ABIs were within normal limits and she was sent home on consultative management. a duplex scan of the lower extremities was done by the nursing home and this showed that the right ABI was 0.96 with a multiphasic waveform throughout the right leg. There was a monophasic waveform of the left leg suggesting left external iliac artery stenosis with blunting distally and the ABI could not be calculated on the left side. Moderate to severe ischemia was suspected of the left leg and a MR angiogram was recommended. 09/17/2014 -- Xray Left tibia and fibula --- unremarkable tibia and fibula Her wound culture is back and this has grown a heavy growth of staph aureus sensitive to ciprofloxacin and clindamycin and Bactrim. She has been on doxycycline since July 14 for 10 days and we will review her clinically before switching her antibiotics if need be. After reviewing her chart she is not going to be here till 09/17/2014 and hence I will [name], Kara L. ([id number]) call in Bactrim DS 1 twice a day for 14 days. She has seen Dr. Lucky Cowboy for an opinion and she is scheduled a procedure on this coming Thursday. other than that the pain and swelling continues and she  has no other acute problems. Objective Constitutional Pulse regular. Respirations normal and unlabored. Afebrile. Vitals Time Taken: 9:20 AM, Height: 67 in, Weight: 159 lbs, BMI: 24.9, Temperature: 97.6 F, Pulse: 74 bpm, Respiratory Rate: 18 breaths/min, Blood Pressure: 151/54 mmHg. Eyes Nonicteric. Reactive to light. Ears, Nose, Mouth, and Throat Lips, teeth, and gums WNL.Marland Kitchen Moist mucosa without lesions . Neck supple and nontender. No palpable supraclavicular or cervical adenopathy. Normal sized without goiter. Respiratory WNL. No retractions.. Cardiovascular Pedal Pulses Doppler signals present monophasic and ABI could not be measured.. She has edema of the left lower extremity. Lymphatic No adneopathy. No adenopathy. No adenopathy. Musculoskeletal Adexa without tenderness or enlargement.. Digits and nails w/o clubbing, cyanosis,  infection, petechiae, ischemia, or inflammatory conditions.Marland Kitchen Psychiatric Judgement and insight Intact.. No evidence of depression, anxiety, or agitation.. General Notes: The medial ulcer has completely healed but the lateral one continues to be tenderness (we will use Santyl ointment locally.) [name], Kara L. ([id number]) Integumentary (Hair, Skin) No suspicious lesions. No crepitus or fluctuance. No peri-wound warmth or erythema. No masses.. Wound #1 status is Open. Original cause of wound was Gradually Appeared. The wound is located on the Left,Lateral Lower Leg. The wound measures 8cm length x 3.5cm width x 0.2cm depth; 21.991cm^2 area and 4.398cm^3 volume. The wound is limited to skin breakdown. There is no tunneling or undermining noted. There is a medium amount of serosanguineous drainage noted. The wound margin is distinct with the outline attached to the wound base. There is medium (34-66%) red, pink granulation within the wound bed. There is a medium (34-66%) amount of necrotic tissue within the wound bed including Adherent Slough. The  periwound skin appearance exhibited: Localized Edema, Moist, Mottled. The periwound skin appearance did not exhibit: Callus, Crepitus, Excoriation, Fluctuance, Friable, Induration, Rash, Scarring, Dry/Scaly, Maceration, Atrophie Blanche, Cyanosis, Ecchymosis, Hemosiderin Staining, Pallor, Rubor, Erythema. Periwound temperature was noted as No Abnormality. The periwound has tenderness on palpation. Wound #2 status is Healed - Epithelialized. Original cause of wound was Gradually Appeared. The wound is located on the Left,Anterior Lower Leg. The wound measures 0cm length x 0cm width x 0cm depth; 0cm^2 area and 0cm^3 volume. The wound is limited to skin breakdown. There is a none present amount of drainage noted. The wound margin is distinct with the outline attached to the wound base. There is medium (34-66%) pale granulation within the wound bed. There is no necrotic tissue within the wound bed. The periwound skin appearance exhibited: Localized Edema, Rash, Dry/Scaly, Mottled. The periwound skin appearance did not exhibit: Callus, Crepitus, Excoriation, Fluctuance, Friable, Induration, Scarring, Maceration, Moist, Atrophie Blanche, Cyanosis, Ecchymosis, Hemosiderin Staining, Pallor, Rubor, Erythema. Periwound temperature was noted as No Abnormality. Assessment Active Problems ICD-10 I70.248 - Atherosclerosis of native arteries of left leg with ulceration of other part of lower left leg L97.222 - Non-pressure chronic ulcer of left calf with fat layer exposed L03.116 - Cellulitis of left lower limb The patient is scheduled for vascular procedure this coming Thursday. We will continue with Santyl to the lateral wound and apply a very light bandage to keep in place. Emphasis has been laid that she should not have a tight compression bandage. Discussed with her daughter at the bedside and she will come and see me next week. Plan [name], [name] ([id number]) Wound Cleansing: Wound #1  Left,Lateral Lower Leg: Clean wound with wound cleanser. Anesthetic: Wound #1 Left,Lateral Lower Leg: Topical Lidocaine 4% cream applied to wound bed prior to debridement Primary Wound Dressing: Wound #1 Left,Lateral Lower Leg: Santyl Ointment Secondary Dressing: Wound #1 Left,Lateral Lower Leg: Conform/Kerlix Non-adherent pad Dressing Change Frequency: Wound #1 Left,Lateral Lower Leg: Change dressing every day. Follow-up Appointments: Wound #1 Left,Lateral Lower Leg: Return Appointment in 1 week. Additional Orders / Instructions: Wound #1 Left,Lateral Lower Leg: Increase protein intake. The patient is scheduled for vascular procedure this coming Thursday. We will continue with Santyl to the lateral wound and apply a very light bandage to keep in place. Emphasis has been laid that she should not have a tight compression bandage. Discussed with her daughter at the bedside and she will come and see me next week. Electronic Signature(s) Signed: 09/17/2014 9:46:51 AM By: Christin Fudge MD, FACS Entered By:  Dawon Troop on 09/17/2014 09:46:51 [name], [name] ([id number]) -------------------------------------------------------------------------------- SuperBill Details Patient Name: [name], LAHAM. Date of Service: 09/17/2014 Medical Record Number: [id number] Patient Account Number: [id number] Date of Birth/Sex: 1922-09-06 (81 y.o. Female) Treating RN: Primary Care Physician: Einar Pheasant Other Clinician: Referring Physician: Einar Pheasant Treating Physician/Extender: Frann Rider in Treatment: 1 Diagnosis Coding ICD-10 Codes Code Description [phone] Atherosclerosis of native arteries of left leg with ulceration of other part of lower left leg L97.222 Non-pressure chronic ulcer of left calf with fat layer exposed L03.116 Cellulitis of left lower limb Facility Procedures CPT4 Code: [id number] Description: 70929 - WOUND CARE VISIT-LEV 3 EST PT Modifier: Quantity:  1 Physician Procedures CPT4: Description Modifier Quantity Code [id number] 99213 - WC PHYS LEVEL 3 - EST PT 1 ICD-10 Description Diagnosis I70.248 Atherosclerosis of native arteries of left leg with ulceration of other part of lower left leg L97.222 Non-pressure chronic ulcer of  left calf with fat layer exposed L03.116 Cellulitis of left lower limb Electronic Signature(s) Signed: 09/17/2014 3:44:26 PM By: Regan Lemming BSN, RN Previous Signature: 09/17/2014 9:47:12 AM Version By: Christin Fudge MD, FACS Entered By: Regan Lemming on 09/17/2014 15:26:34

## 2014-09-20 ENCOUNTER — Encounter: Payer: Self-pay | Admitting: *Deleted

## 2014-09-20 ENCOUNTER — Encounter
Admission: RE | Disposition: A | Discharge status: Home / Self Care | Payer: Self-pay | Source: Ambulatory Visit | Attending: Vascular Surgery

## 2014-09-20 ENCOUNTER — Ambulatory Visit: Admit: 2014-09-20 | Payer: Self-pay | Admitting: Vascular Surgery

## 2014-09-20 ENCOUNTER — Ambulatory Visit
Admission: RE | Admit: 2014-09-20 | Discharge: 2014-09-20 | Disposition: A | Discharge status: Home / Self Care | Payer: Medicare Other | Source: Ambulatory Visit | Attending: Vascular Surgery | Admitting: Vascular Surgery

## 2014-09-20 DIAGNOSIS — I4891 Unspecified atrial fibrillation: Secondary | ICD-10-CM | POA: Insufficient documentation

## 2014-09-20 DIAGNOSIS — I6529 Occlusion and stenosis of unspecified carotid artery: Secondary | ICD-10-CM | POA: Diagnosis not present

## 2014-09-20 DIAGNOSIS — E780 Pure hypercholesterolemia: Secondary | ICD-10-CM | POA: Insufficient documentation

## 2014-09-20 DIAGNOSIS — D649 Anemia, unspecified: Secondary | ICD-10-CM | POA: Insufficient documentation

## 2014-09-20 DIAGNOSIS — E119 Type 2 diabetes mellitus without complications: Secondary | ICD-10-CM | POA: Insufficient documentation

## 2014-09-20 DIAGNOSIS — Z79899 Other long term (current) drug therapy: Secondary | ICD-10-CM | POA: Insufficient documentation

## 2014-09-20 DIAGNOSIS — I10 Essential (primary) hypertension: Secondary | ICD-10-CM | POA: Diagnosis not present

## 2014-09-20 DIAGNOSIS — R0989 Other specified symptoms and signs involving the circulatory and respiratory systems: Secondary | ICD-10-CM | POA: Diagnosis not present

## 2014-09-20 DIAGNOSIS — I70242 Atherosclerosis of native arteries of left leg with ulceration of calf: Secondary | ICD-10-CM | POA: Diagnosis not present

## 2014-09-20 HISTORY — PX: PERIPHERAL VASCULAR CATHETERIZATION: SHX172C

## 2014-09-20 LAB — BASIC METABOLIC PANEL
ANION GAP: 11 (ref 5–15)
BUN: 12 mg/dL (ref 6–20)
CALCIUM: 9.2 mg/dL (ref 8.9–10.3)
CO2: 27 mmol/L (ref 22–32)
Chloride: 90 mmol/L — ABNORMAL LOW (ref 101–111)
Creatinine, Ser: 0.98 mg/dL (ref 0.44–1.00)
GFR calc Af Amer: 57 mL/min — ABNORMAL LOW (ref >60)
GFR calc non Af Amer: 49 mL/min — ABNORMAL LOW (ref >60)
Glucose, Bld: 124 mg/dL — ABNORMAL HIGH (ref 65–99)
POTASSIUM: 4.4 mmol/L (ref 3.5–5.1)
SODIUM: 128 mmol/L — AB (ref 135–145)

## 2014-09-20 SURGERY — LOWER EXTREMITY ANGIOGRAPHY
Anesthesia: Moderate Sedation

## 2014-09-20 MED ORDER — HEPARIN SODIUM (PORCINE) 1000 UNIT/ML IJ SOLN
INTRAMUSCULAR | Status: DC | PRN
  Administered 2014-09-20: 4000 [IU] via INTRAVENOUS

## 2014-09-20 MED ORDER — HEPARIN SODIUM (PORCINE) 1000 UNIT/ML IJ SOLN
INTRAMUSCULAR | Status: AC
  Filled 2014-09-20: qty 1

## 2014-09-20 MED ORDER — ONDANSETRON HCL 4 MG/2ML IJ SOLN: 4.0000 mg | INTRAMUSCULAR | Status: DC | PRN

## 2014-09-20 MED ORDER — DEXTROSE 50 % IV SOLN: 0.5000 | Freq: Once | INTRAVENOUS | Status: DC | PRN

## 2014-09-20 MED ORDER — CEFAZOLIN SODIUM 1-5 GM-% IV SOLN: 1.0000 g | Freq: Once | INTRAVENOUS | Status: DC

## 2014-09-20 MED ORDER — FENTANYL CITRATE (PF) 100 MCG/2ML IJ SOLN
INTRAMUSCULAR | Status: DC | PRN
  Administered 2014-09-20: 50 ug via INTRAVENOUS

## 2014-09-20 MED ORDER — MIDAZOLAM HCL 2 MG/2ML IJ SOLN
INTRAMUSCULAR | Status: DC | PRN
  Administered 2014-09-20: 2 mg via INTRAVENOUS

## 2014-09-20 MED ORDER — SODIUM BICARBONATE BOLUS VIA INFUSION
INTRAVENOUS | Status: DC
  Filled 2014-09-20: qty 1

## 2014-09-20 MED ORDER — FENTANYL CITRATE (PF) 100 MCG/2ML IJ SOLN
INTRAMUSCULAR | Status: AC
  Filled 2014-09-20: qty 2

## 2014-09-20 MED ORDER — LIDOCAINE-EPINEPHRINE (PF) 1 %-1:200000 IJ SOLN
INTRAMUSCULAR | Status: AC
  Filled 2014-09-20: qty 30

## 2014-09-20 MED ORDER — SODIUM BICARBONATE 8.4 % IV SOLN
INTRAVENOUS | Status: AC
  Administered 2014-09-20: 09:00:00 via INTRAVENOUS
  Filled 2014-09-20: qty 150

## 2014-09-20 MED ORDER — CLINDAMYCIN PHOSPHATE 300 MG/50ML IV SOLN
300.0000 mg | Freq: Once | INTRAVENOUS | Status: AC
  Administered 2014-09-20: 300 mg via INTRAVENOUS

## 2014-09-20 MED ORDER — MIDAZOLAM HCL 5 MG/5ML IJ SOLN
INTRAMUSCULAR | Status: AC
  Filled 2014-09-20: qty 5

## 2014-09-20 MED ORDER — CLINDAMYCIN PHOSPHATE 300 MG/50ML IV SOLN
INTRAVENOUS | Status: AC
  Administered 2014-09-20: 300 mg via INTRAVENOUS
  Filled 2014-09-20: qty 50

## 2014-09-20 MED ORDER — HYDROMORPHONE HCL 1 MG/ML IJ SOLN: 1.0000 mg | INTRAMUSCULAR | Status: DC | PRN

## 2014-09-20 MED ORDER — ATROPINE SULFATE 0.1 MG/ML IJ SOLN: 0.5000 mg | Freq: Once | INTRAMUSCULAR | Status: DC | PRN

## 2014-09-20 MED ORDER — HEPARIN (PORCINE) IN NACL 2-0.9 UNIT/ML-% IJ SOLN
INTRAMUSCULAR | Status: AC
  Filled 2014-09-20: qty 1000

## 2014-09-20 MED ORDER — SODIUM CHLORIDE 0.9 % IV SOLN
INTRAVENOUS | Status: DC
  Administered 2014-09-20: 08:00:00 via INTRAVENOUS

## 2014-09-20 MED ORDER — IOHEXOL 300 MG/ML  SOLN
INTRAMUSCULAR | Status: DC | PRN
  Administered 2014-09-20: 120 mL via INTRA_ARTERIAL

## 2014-09-20 MED ORDER — CEFAZOLIN SODIUM 1-5 GM-% IV SOLN
INTRAVENOUS | Status: AC
  Filled 2014-09-20: qty 50

## 2014-09-20 SURGICAL SUPPLY — 22 items
BALLN LUTONIX 5X150X130 (BALLOONS) ×4
BALLN ULTRVRSE 3X220X150 (BALLOONS) ×4
BALLN ULTRVRSE 5X15X130 (BALLOONS) ×4 IMPLANT
BALLOON LUTONIX 5X150X130 (BALLOONS) ×2 IMPLANT
BALLOON ULTRVRSE 3X220X150 (BALLOONS) ×2 IMPLANT
CATH CXI SUPP ANG 4FR 135 (MICROCATHETER) ×2 IMPLANT
CATH CXI SUPP ANG 4FR 135CM (MICROCATHETER) ×4
CATH KMP 5.0X100 (CATHETERS) ×4 IMPLANT
CATH ROYAL FLUSH PIG 5F 70CM (CATHETERS) ×4 IMPLANT
DEVICE PRESTO INFLATION (MISCELLANEOUS) ×4 IMPLANT
DEVICE STARCLOSE SE CLOSURE (Vascular Products) ×4 IMPLANT
DEVICE TORQUE (MISCELLANEOUS) ×4 IMPLANT
GLIDEWIRE ADV .035X260CM (WIRE) ×4 IMPLANT
GUIDEWIRE PFTE-COATED .018X300 (WIRE) ×4 IMPLANT
PACK ANGIOGRAPHY (CUSTOM PROCEDURE TRAY) ×4 IMPLANT
SHEATH ANL2 6FRX45 HC (SHEATH) ×4 IMPLANT
SHEATH BRITE TIP 5FRX11 (SHEATH) ×4 IMPLANT
STENT VIABAHN 5X250X120 (Permanent Stent) ×8 IMPLANT
SYR MEDRAD MARK V 150ML (SYRINGE) ×4 IMPLANT
TUBING CONTRAST HIGH PRESS 72 (TUBING) ×4 IMPLANT
WIRE G V18X300CM (WIRE) ×4 IMPLANT
WIRE J 3MM .035X145CM (WIRE) ×4 IMPLANT

## 2014-09-20 NOTE — CV Procedure (Signed)
[facility] Percutaneous Study/Intervention Procedural Note   Date of Surgery: 09/20/2014  Surgeon(s):DEW,JASON   Assistants:none  Pre-operative Diagnosis: PAD with ulceration left lower extremity, calf ulceration, status post previous right lower extremity revascularization for PAD with ulceration  Post-operative diagnosis: Same  Procedure(s) Performed: 1. Ultrasound guidance for vascular access right femoral artery 2. Catheter placement into left peroneal artery from right femoral approach 3. Aortogram and selective left lower extremity angiogram 4. Percutaneous transluminal angioplasty of left peroneal artery and tibioperoneal trunk with 3 mm diameter angioplasty balloon 5. Percutaneous transluminal angioplasty of left superficial femoral artery with 5 mm diameter drug-coated angioplasty balloon  6.  Percutaneous transluminal angioplasty of left popliteal artery with 5 mm diameter angioplasty balloon  7.  Viabahn covered stent placement to the distal SFA and popliteal artery for greater than 50% residual stenosis and dissection after angioplasty 8. StarClose closure device right femoral artery  EBL: 25 cc  Indications: Patient is a 81 year old female with a large nonhealing ulceration on the left lower extremity. She is artery undergone previous revascularization for right lower extremity disease in the past. The patient is brought in for angiography for further evaluation and potential treatment. Risks and benefits are discussed and informed consent is obtained  Procedure: The patient was identified and appropriate procedural time out was performed. The patient was then placed supine on the table and prepped and draped in the usual sterile fashion. Ultrasound was used to evaluate the right common femoral artery. It was patent . A digital ultrasound image was  acquired. A Seldinger needle was used to access the right common femoral artery under direct ultrasound guidance and a permanent image was performed. A 0.035 J wire was advanced without resistance and a 5Fr sheath was placed. Pigtail catheter was placed into the aorta and an AP aortogram was performed. This demonstrated moderate stenosis of both renal arteries and normal aorta and iliac segments without significant stenosis. I then crossed the aortic bifurcation and advanced to the left femoral head and then into the proximal SFA. Selective left lower extremity angiogram was then performed. This demonstrated a diffusely diseased superficial femoral artery with several areas of greater than 50% stenosis and occlusion of the popliteal artery above the knee. There was then what appeared to be at least moderate stenosis of the tibioperoneal trunk and may be more high-grade with the peroneal artery being the only vessel continuous distally. The posterior tibial artery did reconstitute at the ankle. The low amount of flow into this area made it difficult to visualize initially. At this point, the patient will need extensive revascularization for limb salvage. The patient was systemically heparinized and a 6 [place] Ansell sheath was then placed over the [employer] wire. I then used a Kumpe catheter and the advantage wire to navigate through the SFA stenoses and the popliteal artery occlusion and into what appeared to be the peroneal artery. A CXI catheter was then advanced and this was found to be in an AV fistula to the tibial veins. I was able to pull the catheter back into the proximal peroneal artery and navigate a wire down the true peroneal artery. It was difficult to discern if this was present prior to catheter placement as the amount of flow distally was quite low, but the wire passed easily without any significant resistance into the vein making this a possibility. We did confirm the 70-80% stenosis in  the tibioperoneal trunk and treated the tibioperoneal trunk and peroneal artery proximally with  a 3 mm diameter angioplasty balloon inflated to 12 atm for several minutes. This was to both treat the stenosis of the tibioperoneal trunk as well as the AV fistula and the peroneal artery. The popliteal artery was treated with a 5 mm diameter by 15 cm length angioplasty balloon inflated to 12 atm for 1 minute. The SFA was then treated with a 5 mm diameter by 15 cm length Lutonix drug-coated angioplasty balloon inflated to 10 atm for 1 minute. Angiogram following intervention with angioplasty to these 3 areas demonstrated greater than 50% residual stenosis and dissection in the distal SFA and above-knee popliteal artery. I elected to treat this lesion with a 5 mm diameter by 25 cm length Viabahn covered stent postdilated with a 5 mm balloon with an excellent angiographic completion result. As for the tibial vessels, there was still significant AV fistula but markedly improved flow with brisk flow into the peroneal artery distally. Her distal perfusion was significantly improved, and I elected to see if this lessened residual AV fistula with decreased over time after her anticoagulation wore off. I decided placing a covered stent that would be significantly oversized for her tibial vessel was not worth the risk today, but if the AV fistula persists over time this may be a consideration for the future. I felt we had markedly improved her flow and improved her chance for wound healing. I elected to terminate the procedure. The sheath was removed and StarClose closure device was deployed in the left femoral artery with excellent hemostatic result. The patient was taken to the recovery room in stable condition having tolerated the procedure well.  Findings:  Aortogram: What appeared to be moderate stenosis of both renal arteries with the significantly downward takeoff, no aortoiliac stenosis Left  Lower Extremity: Diffuse SFA stenosis, popliteal occlusion, and tibial disease.   Disposition: Patient was taken to the recovery room in stable condition having tolerated the procedure well.  Complications: None  DEW,JASON 09/20/2014 9:55 AM

## 2014-09-20 NOTE — Discharge Instructions (Signed)

## 2014-09-20 NOTE — H&P (Signed)
 [facility] History & Physical Update  The patient was interviewed and re-examined.  The patient's previous History and Physical has been reviewed and is unchanged.  There is no change in the plan of care. We plan to proceed with the scheduled procedure.  Maecy Podgurski, MD  09/20/2014, 8:19 AM

## 2014-09-21 ENCOUNTER — Encounter: Payer: Self-pay | Admitting: Vascular Surgery

## 2014-09-24 ENCOUNTER — Ambulatory Visit: Payer: Medicare Other | Admitting: Surgery

## 2014-09-25 ENCOUNTER — Encounter: Payer: Medicare Other | Admitting: Surgery

## 2014-09-25 DIAGNOSIS — L97222 Non-pressure chronic ulcer of left calf with fat layer exposed: Secondary | ICD-10-CM | POA: Diagnosis not present

## 2014-09-25 NOTE — Progress Notes (Signed)
[name], [name] ([id number]) Visit Report for 09/25/2014 Arrival Information Details Patient Name: [name], [name]. Date of Service: 09/25/2014 1:45 PM Medical Record Number: [id number] Patient Account Number: [id number] Date of Birth/Sex: 15-May-1922 (81 y.o. Female) Treating RN: Afful, RN, BSN, Velva Harman Primary Care Physician: Einar Pheasant Other Clinician: Referring Physician: Einar Pheasant Treating Physician/Extender: Frann Rider in Treatment: 2 Visit Information History Since Last Visit Added or deleted any medications: No Patient Arrived: Walker Any new allergies or adverse reactions: No Arrival Time: 12:53 Had a fall or experienced change in No Accompanied By: dtr activities of daily [name] that may affect Transfer Assistance: None risk of falls: Patient Identification Verified: Yes Signs or symptoms of abuse/neglect since last No Secondary Verification Process Yes visito Completed: Hospitalized since last visit: No Patient Requires Transmission- No Has Dressing in Place as Prescribed: Yes Based Precautions: Pain Present Now: No Patient Has Alerts: Yes Patient Alerts: Patient on Blood Thinner Eliquis. R ABI:0.96 Electronic Signature(s) Signed: 09/25/2014 12:53:35 PM By: Regan Lemming BSN, RN Entered By: Regan Lemming on 09/25/2014 12:53:34 [name] ([id number]) -------------------------------------------------------------------------------- Encounter Discharge Information Details Patient Name: [name] Date of Service: 09/25/2014 1:45 PM Medical Record Number: [id number] Patient Account Number: [id number] Date of Birth/Sex: [birth year] (81 y.o. Female) Treating RN: Primary Care Physician: Einar Pheasant Other Clinician: Referring Physician: Einar Pheasant Treating Physician/Extender: Frann Rider in Treatment: 2 Encounter Discharge Information Items Schedule Follow-up Appointment: No Medication Reconciliation completed No and provided  to Patient/Care Jaelah Hauth: Provided on Clinical Summary of Care: 09/25/2014 Form Type Recipient Paper Patient LS Electronic Signature(s) Signed: 09/25/2014 1:25:51 PM By: Ruthine Dose Entered By: Ruthine Dose on 09/25/2014 13:25:51 [name] ([id number]) -------------------------------------------------------------------------------- Lower Extremity Assessment Details Patient Name: [name]. Date of Service: 09/25/2014 1:45 PM Medical Record Number: [id number] Patient Account Number: [id number] Date of Birth/Sex: 04-01-1922 (81 y.o. Female) Treating RN: Afful, RN, BSN, Velva Harman Primary Care Physician: Einar Pheasant Other Clinician: Referring Physician: Einar Pheasant Treating Physician/Extender: Frann Rider in Treatment: 2 Edema Assessment Assessed: [Left: No] [Right: No] Edema: [Left: Ye] [Right: s] Calf Left: Right: Point of Measurement: 37 cm From Medial Instep 39 cm cm Ankle Left: Right: Point of Measurement: 12 cm From Medial Instep 26 cm cm Vascular Assessment Pulses: Posterior Tibial Dorsalis Pedis Palpable: [Left:Yes] Extremity colors, hair growth, and conditions: Extremity Color: [Left:Mottled] Hair Growth on Extremity: [Left:No] Temperature of Extremity: [Left:Warm] Capillary Refill: [Left:< 3 seconds] Dependent Rubor: [Left:No] Blanched when Elevated: [Left:No] Lipodermatosclerosis: [Left:No] Toe Nail Assessment Left: Right: Thick: Yes Discolored: Yes Deformed: No Improper Length and Hygiene: Yes Electronic Signature(s) Signed: 09/25/2014 1:00:54 PM By: Regan Lemming BSN, RN DOBLE, Dolly Rias ([id number]) Entered By: Regan Lemming on 09/25/2014 13:00:54 [name] ([id number]) -------------------------------------------------------------------------------- Multi Wound Chart Details Patient Name: [name]. Date of Service: 09/25/2014 1:45 PM Medical Record Number: [id number] Patient Account Number: [id number] Date of Birth/Sex:  12-Feb-1923 (81 y.o. Female) Treating RN: Baruch Gouty, RN, BSN, Velva Harman Primary Care Physician: Einar Pheasant Other Clinician: Referring Physician: Einar Pheasant Treating Physician/Extender: Frann Rider in Treatment: 2 Vital Signs Height(in): 67 Pulse(bpm): 72 Weight(lbs): 159 Blood Pressure 137/95 (mmHg): Body Mass Index(BMI): 25 Temperature(F): 98.1 Respiratory Rate 18 (breaths/min): Photos: [1:No Photos] [N/A:N/A] Wound Location: [1:Left Lower Leg - Lateral] [N/A:N/A] Wounding Event: [1:Gradually Appeared] [N/A:N/A] Primary Etiology: [1:Venous Leg Ulcer] [N/A:N/A] Secondary Etiology: [1:Cellulitis] [N/A:N/A] Comorbid History: [1:Cataracts, Arrhythmia, Hypertension, Peripheral Arterial Disease, Peripheral Venous Disease, Osteoarthritis, Confinement Anxiety] [N/A:N/A] Date Acquired: [1:08/17/2014] [N/A:N/A] Weeks of Treatment: [1:2] [N/A:N/A] Wound Status: [  1:Open] [N/A:N/A] Measurements L x W x D 7.5x3x0.2 [N/A:N/A] (cm) Area (cm) : [1:17.671] [N/A:N/A] Volume (cm) : [1:3.534] [N/A:N/A] % Reduction in Area: [1:33.00%] [N/A:N/A] % Reduction in Volume: 33.00% [N/A:N/A] Classification: [1:Full Thickness Without Exposed Support Structures] [N/A:N/A] Exudate Amount: [1:Medium] [N/A:N/A] Exudate Type: [1:Serosanguineous] [N/A:N/A] Exudate Color: [1:red, brown] [N/A:N/A] Wound Margin: [1:Distinct, outline attached] [N/A:N/A] Granulation Amount: [1:Medium (34-66%)] [N/A:N/A] Granulation Quality: [1:Red, Pink] [N/A:N/A] Necrotic Amount: [1:Medium (34-66%)] [N/A:N/A] Exposed Structures: Fascia: No N/A N/A Fat: No Tendon: No Muscle: No Joint: No Bone: No Limited to Skin Breakdown Epithelialization: Small (1-33%) N/A N/A Periwound Skin Texture: Edema: Yes N/A N/A Excoriation: No Induration: No Callus: No Crepitus: No Fluctuance: No Friable: No Rash: No Scarring: No Periwound Skin Moist: Yes N/A N/A Moisture: Maceration: No Dry/Scaly: No Periwound Skin Color:  Mottled: Yes N/A N/A Atrophie Blanche: No Cyanosis: No Ecchymosis: No Erythema: No Hemosiderin Staining: No Pallor: No Rubor: No Temperature: No Abnormality N/A N/A Tenderness on Yes N/A N/A Palpation: Wound Preparation: Ulcer Cleansing: N/A N/A Rinsed/Irrigated with Saline Topical Anesthetic Applied: Other: lidocaine 4% Treatment Notes Electronic Signature(s) Signed: 09/25/2014 1:08:39 PM By: Regan Lemming BSN, RN Entered By: Regan Lemming on 09/25/2014 13:08:39 [name] ([id number]) -------------------------------------------------------------------------------- [place] Details Patient Name: [name] Date of Service: 09/25/2014 1:45 PM Medical Record Number: [id number] Patient Account Number: [id number] Date of Birth/Sex: [birth year] (81 y.o. Female) Treating RN: Afful, RN, BSN, Velva Harman Primary Care Physician: Einar Pheasant Other Clinician: Referring Physician: Einar Pheasant Treating Physician/Extender: Frann Rider in Treatment: 2 Active Inactive Abuse / Safety / Falls / Self Care Management Nursing Diagnoses: Potential for falls Goals: Patient will remain injury free Date Initiated: 09/07/2014 Goal Status: Active Interventions: Assess fall risk on admission and as needed Notes: Nutrition Nursing Diagnoses: Potential for alteratiion in Nutrition/Potential for imbalanced nutrition Goals: Patient/caregiver agrees to and verbalizes understanding of need to use nutritional supplements and/or vitamins as prescribed Date Initiated: 09/07/2014 Goal Status: Active Interventions: Assess patient nutrition upon admission and as needed per policy Notes: Orientation to the Wound Care Program Nursing Diagnoses: Knowledge deficit related to the wound healing center program Goals: Patient/caregiver will verbalize understanding of the [place], [place] ([id number]) Date Initiated: 09/07/2014 Goal Status:  Active Interventions: Provide education on orientation to the wound center Notes: Wound/Skin Impairment Nursing Diagnoses: Impaired tissue integrity Goals: Patient/caregiver will verbalize understanding of skin care regimen Date Initiated: 09/07/2014 Goal Status: Active Ulcer/skin breakdown will heal within 14 weeks Date Initiated: 09/07/2014 Goal Status: Active Interventions: Assess patient/caregiver ability to obtain necessary supplies Assess ulceration(s) every visit Notes: Electronic Signature(s) Signed: 09/25/2014 1:01:04 PM By: Regan Lemming BSN, RN Entered By: Regan Lemming on 09/25/2014 13:01:03 [name] ([id number]) -------------------------------------------------------------------------------- Pain Assessment Details Patient Name: [name]. Date of Service: 09/25/2014 1:45 PM Medical Record Number: [id number] Patient Account Number: [id number] Date of Birth/Sex: Mar 25, 1922 (81 y.o. Female) Treating RN: Baruch Gouty, RN, BSN, Velva Harman Primary Care Physician: Einar Pheasant Other Clinician: Referring Physician: Einar Pheasant Treating Physician/Extender: Frann Rider in Treatment: 2 Active Problems Location of Pain Severity and Description of Pain Patient Has Paino No Site Locations Pain Management and Medication Current Pain Management: Electronic Signature(s) Signed: 09/25/2014 12:53:50 PM By: Regan Lemming BSN, RN Entered By: Regan Lemming on 09/25/2014 12:53:50 [name] ([id number]) -------------------------------------------------------------------------------- Wound Assessment Details Patient Name: [name]. Date of Service: 09/25/2014 1:45 PM Medical Record Number: [id number] Patient Account Number: [id number] Date of Birth/Sex: Jan 24, 1923 (81 y.o. Female) Treating RN: Afful,  RN, BSN, Velva Harman Primary Care Physician: Einar Pheasant Other Clinician: Referring Physician: Einar Pheasant Treating Physician/Extender: Frann Rider in  Treatment: 2 Wound Status Wound Number: 1 Primary Venous Leg Ulcer Etiology: Wound Location: Left Lower Leg - Lateral Secondary Cellulitis Wounding Event: Gradually Appeared Etiology: Date Acquired: 08/17/2014 Wound Open Weeks Of Treatment: 2 Status: Clustered Wound: No Comorbid Cataracts, Arrhythmia, Hypertension, History: Peripheral Arterial Disease, Peripheral Venous Disease, Osteoarthritis, Confinement Anxiety Photos Photo Uploaded By: Regan Lemming on 09/25/2014 16:30:23 Wound Measurements Length: (cm) 7.5 Width: (cm) 3 Depth: (cm) 0.2 Area: (cm) 17.671 Volume: (cm) 3.534 % Reduction in Area: 33% % Reduction in Volume: 33% Epithelialization: Small (1-33%) Tunneling: No Undermining: No Wound Description Full Thickness Without Exposed Classification: Support Structures Wound Margin: Distinct, outline attached Exudate Medium Amount: Exudate Type: Serosanguineous Exudate Color: red, brown [place], Eugina L. ([id number]) Foul Odor After Cleansing: No Wound Bed Granulation Amount: Medium (34-66%) Exposed Structure Granulation Quality: Red, Pink Fascia Exposed: No Necrotic Amount: Medium (34-66%) Fat Layer Exposed: No Necrotic Quality: Adherent Slough Tendon Exposed: No Muscle Exposed: No Joint Exposed: No Bone Exposed: No Limited to Skin Breakdown Periwound Skin Texture Texture Color No Abnormalities Noted: No No Abnormalities Noted: No Callus: No Atrophie Blanche: No Crepitus: No Cyanosis: No Excoriation: No Ecchymosis: No Fluctuance: No Erythema: No Friable: No Hemosiderin Staining: No Induration: No Mottled: Yes Localized Edema: Yes Pallor: No Rash: No Rubor: No Scarring: No Temperature / Pain Moisture Temperature: No Abnormality No Abnormalities Noted: No Tenderness on Palpation: Yes Dry / Scaly: No Maceration: No Moist: Yes Wound Preparation Ulcer Cleansing: Rinsed/Irrigated with Saline Topical Anesthetic Applied: Other: lidocaine  4%, Electronic Signature(s) Signed: 09/25/2014 12:59:23 PM By: Regan Lemming BSN, RN Entered By: Regan Lemming on 09/25/2014 12:59:23 [name] ([id number]) -------------------------------------------------------------------------------- Vitals Details Patient Name: [name] Date of Service: 09/25/2014 1:45 PM Medical Record Number: [id number] Patient Account Number: [id number] Date of Birth/Sex: [birth year] (81 y.o. Female) Treating RN: Afful, RN, BSN, Velva Harman Primary Care Physician: Einar Pheasant Other Clinician: Referring Physician: Einar Pheasant Treating Physician/Extender: Frann Rider in Treatment: 2 Vital Signs Time Taken: 12:53 Temperature (F): 98.1 Height (in): 67 Pulse (bpm): 72 Weight (lbs): 159 Respiratory Rate (breaths/min): 18 Body Mass Index (BMI): 24.9 Blood Pressure (mmHg): 137/95 Reference Range: 80 - 120 mg / dl Electronic Signature(s) Signed: 09/25/2014 12:56:33 PM By: Regan Lemming BSN, RN Entered By: Regan Lemming on 09/25/2014 12:56:33

## 2014-09-26 NOTE — Progress Notes (Signed)
ELLORY, [name] ([id number]) Visit Report for 09/25/2014 Chief Complaint Document Details Patient Name: [name]. Date of Service: 09/25/2014 1:45 PM Medical Record Number: [id number] Patient Account Number: [id number] Date of Birth/Sex: 08-Sep-1922 (81 y.o. Female) Treating RN: Primary Care Physician: Einar Pheasant Other Clinician: Referring Physician: Einar Pheasant Treating Physician/Extender: Frann Rider in Treatment: 2 Information Obtained from: Patient Chief Complaint Patient presents to the wound care center today with an open arterial ulcer. The patient has had left lower extremity ulceration for about 1 month. Electronic Signature(s) Signed: 09/25/2014 1:14:49 PM By: Christin Fudge MD, FACS Entered By: Christin Fudge on 09/25/2014 13:14:48 [name] ([id number]) -------------------------------------------------------------------------------- Debridement Details Patient Name: [name]. Date of Service: 09/25/2014 1:45 PM Medical Record Number: [id number] Patient Account Number: [id number] Date of Birth/Sex: 1922-12-31 (81 y.o. Female) Treating RN: Primary Care Physician: Einar Pheasant Other Clinician: Referring Physician: Einar Pheasant Treating Physician/Extender: Frann Rider in Treatment: 2 Debridement Performed for Wound #1 Left,Lateral Lower Leg Assessment: Performed By: Physician Pat Patrick., MD Debridement: Debridement Pre-procedure Yes Verification/Time Out Taken: Start Time: 13:10 Pain Control: Lidocaine 4% Topical Solution Level: Skin/Subcutaneous Tissue Total Area Debrided (L x 7.5 (cm) x 3 (cm) = 22.5 (cm) W): Tissue and other Exudate, Fibrin/Slough, Subcutaneous material debrided: Instrument: Curette Bleeding: Minimum Hemostasis Achieved: Pressure End Time: 13:15 Procedural Pain: 0 Post Procedural Pain: 0 Response to Treatment: Procedure was tolerated well Post Debridement Measurements of Total  Wound Length: (cm) 7.5 Width: (cm) 3 Depth: (cm) 0.3 Volume: (cm) 5.301 Electronic Signature(s) Signed: 09/25/2014 1:14:39 PM By: Christin Fudge MD, FACS Previous Signature: 09/25/2014 1:12:02 PM Version By: Regan Lemming BSN, RN Entered By: Christin Fudge on 09/25/2014 13:14:38 [name] ([id number]) -------------------------------------------------------------------------------- HPI Details Patient Name: [name]. Date of Service: 09/25/2014 1:45 PM Medical Record Number: [id number] Patient Account Number: [id number] Date of Birth/Sex: [birth year] (81 y.o. Female) Treating RN: Primary Care Physician: Einar Pheasant Other Clinician: Referring Physician: Einar Pheasant Treating Physician/Extender: Frann Rider in Treatment: 2 History of Present Illness Location: left lower extremity ulceration Quality: Patient reports experiencing burning to affected area(s). Severity: Patient states wound are getting worse. Duration: Patient has had the wound for < 4 weeks prior to presenting for treatment Timing: Pain in wound is constant (hurts all the time) Context: The wound appeared gradually over time Modifying Factors: Other treatment(s) tried include:has been on oral anti-buttocks since the beginning of the month and had 10 days of Augmentin and now is on doxycycline. Associated Signs and Symptoms: Patient reports having difficulty standing for long periods. HPI Description: 81 year old patient was admitted to [facility] between 08/13/2014 and 08/17/2014 with cellulitis of the left lower extremity.duplex study done then showed no DVT and the patient was on IV vancomycin and Rocephin and also received Unasyn later. The patient was sent home on 10 days of Augmentin. Her other comorbidities of atrial fibrillation, essential hypertension, diabetes mellitus type 2, coronary artery disease were managed with appropriate medications. on 09/04/2014 she was seen  in the ER and was found to have a 10 cm ulceration on the left lower extremity lateral and superior to the lateral malleolus. The granulation tissue was noted and she was referred to the wound center. the ER notes that her ABIs were within normal limits and she was sent home on consultative management. a duplex scan of the lower extremities was done by the nursing home and this showed that the right ABI was 0.96 with a multiphasic waveform throughout  the right leg. There was a monophasic waveform of the left leg suggesting left external iliac artery stenosis with blunting distally and the ABI could not be calculated on the left side. Moderate to severe ischemia was suspected of the left leg and a MR angiogram was recommended. 09/17/2014 -- Xray Left tibia and fibula --- unremarkable tibia and fibula Her wound culture is back and this has grown a heavy growth of staph aureus sensitive to ciprofloxacin and clindamycin and Bactrim. She has been on doxycycline since July 14 for 10 days and we will review her clinically before switching her antibiotics if need be. After reviewing her chart she is not going to be here till 09/17/2014 and hence I will call in Bactrim DS 1 twice a day for 14 days. She has seen Dr. Lucky Cowboy for an opinion and she is scheduled a procedure on this coming Thursday. other than that the pain and swelling continues and she has no other acute problems. 09/25/2014 -- she had a recent procedure done by Dr. dew for diffuse SFA stenosis, popliteal occlusion and tibial disease. she had at angioplasty of the left peroneal artery and tibioperoneal trunk, left superficial femoral artery, left popliteal artery, distal SFA and popliteal artery. the patient is experiencing some swelling of the left lower extremity after the procedure. [name], [name] ([id number]) Electronic Signature(s) Signed: 09/25/2014 1:16:50 PM By: Christin Fudge MD, FACS Entered By: Christin Fudge on 09/25/2014  13:16:50 [name] ([id number]) -------------------------------------------------------------------------------- Physical Exam Details Patient Name: [name] Date of Service: 09/25/2014 1:45 PM Medical Record Number: [id number] Patient Account Number: [id number] Date of Birth/Sex: 02-Mar-1922 (81 y.o. Female) Treating RN: Primary Care Physician: Einar Pheasant Other Clinician: Referring Physician: Einar Pheasant Treating Physician/Extender: Frann Rider in Treatment: 2 Constitutional . Pulse regular. Respirations normal and unlabored. Afebrile. . Eyes Nonicteric. Reactive to light. Ears, Nose, Mouth, and Throat Lips, teeth, and gums WNL.Marland Kitchen Moist mucosa without lesions . Neck supple and nontender. No palpable supraclavicular or cervical adenopathy. Normal sized without goiter. Respiratory WNL. No retractions.. Cardiovascular Pedal Pulses WNL. Marland Kitchen she has significant edema of both lower extremities.. Musculoskeletal Adexa without tenderness or enlargement.. Digits and nails w/o clubbing, cyanosis, infection, petechiae, ischemia, or inflammatory conditions.. Integumentary (Hair, Skin) No suspicious lesions. No crepitus or fluctuance. No peri-wound warmth or erythema. No masses.Marland Kitchen Psychiatric Judgement and insight Intact.. No evidence of depression, anxiety, or agitation.. Notes there is significant edema of the left lower extremity and the ulcerated wound on the lateral part of the leg has some slough which need CHOP debridement with a curette. Electronic Signature(s) Signed: 09/25/2014 1:17:47 PM By: Christin Fudge MD, FACS Entered By: Christin Fudge on 09/25/2014 13:17:47 [name] ([id number]) -------------------------------------------------------------------------------- Physician Orders Details Patient Name: [name] Date of Service: 09/25/2014 1:45 PM Medical Record Patient Account Number: [id number] Number: Afful, RN,  BSN, Treating RN: 22-Apr-1922 (81 y.o. Velva Harman Date of Birth/Sex: Female) Other Clinician: Primary Care Physician: Einar Pheasant Treating Christin Fudge Referring Physician: Einar Pheasant Physician/Extender: Suella Grove in Treatment: 2 Verbal / Phone Orders: Yes Clinician: Afful, RN, BSN, Rita Read Back and Verified: Yes Diagnosis Coding Wound Cleansing Wound #1 Left,Lateral Lower Leg o Clean wound with wound cleanser. Anesthetic Wound #1 Left,Lateral Lower Leg o Topical Lidocaine 4% cream applied to wound bed prior to debridement Primary Wound Dressing Wound #1 Left,Lateral Lower Leg o Santyl Ointment Secondary Dressing Wound #1 Left,Lateral Lower Leg o Conform/Kerlix o Non-adherent pad Dressing Change Frequency Wound #1 Left,Lateral Lower Leg o  Change dressing every day. Follow-up Appointments Wound #1 Left,Lateral Lower Leg o Return Appointment in 1 week. Additional Orders / Instructions Wound #1 Left,Lateral Lower Leg o Increase protein intake. Electronic Signature(s) Signed: 09/25/2014 1:13:02 PM By: Regan Lemming BSN, RN Signed: 09/25/2014 4:14:41 PM By: Christin Fudge MD, FACS [name], [name] ([id number]) Entered By: Regan Lemming on 09/25/2014 13:13:02 [name] ([id number]) -------------------------------------------------------------------------------- Problem List Details Patient Name: [name], [name]. Date of Service: 09/25/2014 1:45 PM Medical Record Number: [id number] Patient Account Number: [id number] Date of Birth/Sex: 1922-08-08 (81 y.o. 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 Date of Service: 09/25/2014 1:45 PM Medical Record Number: [id number] Patient Account Number: [id number] Date of Birth/Sex: 04-27-1922 (81 y.o. Female) Treating RN: Primary Care Physician: Einar Pheasant Other Clinician: Referring Physician: Einar Pheasant Treating Physician/Extender: Frann Rider in Treatment: 2 Subjective Chief Complaint Information obtained from Patient Patient presents to the wound care center today with an open arterial ulcer. The patient has had left lower extremity ulceration for about 1 month. History of Present Illness (HPI) The following HPI elements were documented for the patient's wound: Location: left lower extremity ulceration Quality: Patient reports experiencing burning to affected area(s). Severity: Patient states wound are getting worse. Duration: Patient has had the wound for < 4 weeks prior to presenting for treatment Timing: Pain in wound is constant (hurts all the time) Context: The wound appeared gradually over time Modifying Factors: Other treatment(s) tried include:has been on oral anti-buttocks since the beginning of the month and had 10 days of Augmentin and now is on doxycycline. Associated Signs and Symptoms: Patient reports having difficulty standing for long periods. 81 year old patient was admitted to [facility] between 08/13/2014 and 08/17/2014 with cellulitis of the left lower extremity.duplex study done then showed no DVT and the patient was on IV vancomycin and Rocephin and  also received Unasyn later. The patient was sent home on 10 days of Augmentin. Her other comorbidities of atrial fibrillation, essential hypertension, diabetes mellitus type 2, coronary artery disease were managed with appropriate medications. on 09/04/2014 she was seen in the ER and was found to have a 10 cm ulceration on the left lower extremity lateral and superior to the lateral malleolus. The granulation tissue was noted and she was referred to the wound center. the ER notes that her ABIs were within normal limits and she was sent home on consultative management. a duplex scan of the lower extremities was done by the nursing home and this showed that the right ABI was 0.96 with a multiphasic waveform throughout the right leg. There was a monophasic waveform of the left leg suggesting left external iliac artery stenosis with blunting distally and the ABI could not be calculated on the left side. Moderate to severe ischemia was suspected of the left leg and a MR angiogram was recommended. 09/17/2014 -- Xray Left tibia and fibula --- unremarkable tibia and fibula Her  wound culture is back and this has grown a heavy growth of staph aureus sensitive to ciprofloxacin and clindamycin and Bactrim. She has been on doxycycline since July 14 for 10 days and we will review her clinically before switching her antibiotics if need be. After reviewing her chart she is not going to be here till 09/17/2014 and hence I will [name], Kara L. ([id number]) call in Bactrim DS 1 twice a day for 14 days. She has seen Dr. Lucky Cowboy for an opinion and she is scheduled a procedure on this coming Thursday. other than that the pain and swelling continues and she has no other acute problems. 09/25/2014 -- she had a recent procedure done by Dr. dew for diffuse SFA stenosis, popliteal occlusion and tibial disease. she had at angioplasty of the left peroneal artery and tibioperoneal trunk, left superficial femoral artery, left  popliteal artery, distal SFA and popliteal artery. the patient is experiencing some swelling of the left lower extremity after the procedure. Objective Constitutional Pulse regular. Respirations normal and unlabored. Afebrile. Vitals Time Taken: 12:53 PM, Height: 67 in, Weight: 159 lbs, BMI: 24.9, Temperature: 98.1 F, Pulse: 72 bpm, Respiratory Rate: 18 breaths/min, Blood Pressure: 137/95 mmHg. Eyes Nonicteric. Reactive to light. Ears, Nose, Mouth, and Throat Lips, teeth, and gums WNL.Marland Kitchen Moist mucosa without lesions . Neck supple and nontender. No palpable supraclavicular or cervical adenopathy. Normal sized without goiter. Respiratory WNL. No retractions.. Cardiovascular Pedal Pulses WNL. Marland Kitchen she has significant edema of both lower extremities.. Musculoskeletal Adexa without tenderness or enlargement.. Digits and nails w/o clubbing, cyanosis, infection, petechiae, ischemia, or inflammatory conditions.Marland Kitchen Psychiatric Judgement and insight Intact.. No evidence of depression, anxiety, or agitation.. General Notes: there is significant edema of the left lower extremity and the ulcerated wound on the lateral part of the leg has some slough which need CHOP debridement with a curette. [name], [name] ([id number]) Integumentary (Hair, Skin) No suspicious lesions. No crepitus or fluctuance. No peri-wound warmth or erythema. No masses.. Wound #1 status is Open. Original cause of wound was Gradually Appeared. The wound is located on the Left,Lateral Lower Leg. The wound measures 7.5cm length x 3cm width x 0.2cm depth; 17.671cm^2 area and 3.534cm^3 volume. The wound is limited to skin breakdown. There is no tunneling or undermining noted. There is a medium amount of serosanguineous drainage noted. The wound margin is distinct with the outline attached to the wound base. There is medium (34-66%) red, pink granulation within the wound bed. There is a medium (34-66%) amount of necrotic tissue within  the wound bed including Adherent Slough. The periwound skin appearance exhibited: Localized Edema, Moist, Mottled. The periwound skin appearance did not exhibit: Callus, Crepitus, Excoriation, Fluctuance, Friable, Induration, Rash, Scarring, Dry/Scaly, Maceration, Atrophie Blanche, Cyanosis, Ecchymosis, Hemosiderin Staining, Pallor, Rubor, Erythema. Periwound temperature was noted as No Abnormality. The periwound has tenderness on palpation. Assessment Active Problems ICD-10 I70.248 - Atherosclerosis of native arteries of left leg with ulceration of other part of lower left leg L97.222 - Non-pressure chronic ulcer of left calf with fat layer exposed L03.116 - Cellulitis of left lower limb We will continue dressing the wound with Santyl ointment and applying a light Kerlix wrap to keep the dressing in place. She has been encouraged to elevate her limbs as much as possible and eat a nutritious diet with protein supplements and vitamins. She will come back and see me next week. Procedures Wound #1 Wound #1 is a Venous Leg Ulcer located on the Left,Lateral Lower Leg . There was  a Skin/Subcutaneous Tissue Debridement ([id number]) debridement with total area of 22.5 sq cm performed by Pat Patrick., MD. with the following instrument(s): Curette including Exudate, Fibrin/Slough, and Subcutaneous after achieving pain control using Lidocaine 4% Topical Solution. A time out was conducted prior to the start of the procedure. A Minimum amount of bleeding was controlled with Pressure. The procedure was tolerated well with a pain level of 0 throughout and a pain level of 0 following the procedure. Post Debridement Measurements: 7.5cm length x 3cm width x 0.3cm depth; 5.301cm^3 volume. [name], [name] ([id number]) Plan Wound Cleansing: Wound #1 Left,Lateral Lower Leg: Clean wound with wound cleanser. Anesthetic: Wound #1 Left,Lateral Lower Leg: Topical Lidocaine 4% cream applied to wound bed  prior to debridement Primary Wound Dressing: Wound #1 Left,Lateral Lower Leg: Santyl Ointment Secondary Dressing: Wound #1 Left,Lateral Lower Leg: Conform/Kerlix Non-adherent pad Dressing Change Frequency: Wound #1 Left,Lateral Lower Leg: Change dressing every day. Follow-up Appointments: Wound #1 Left,Lateral Lower Leg: Return Appointment in 1 week. Additional Orders / Instructions: Wound #1 Left,Lateral Lower Leg: Increase protein intake. We will continue dressing the wound with Santyl ointment and applying a light Kerlix wrap to keep the dressing in place. She has been encouraged to elevate her limbs as much as possible and eat a nutritious diet with protein supplements and vitamins. She will come back and see me next week. Electronic Signature(s) Signed: 09/25/2014 1:21:56 PM By: Christin Fudge MD, FACS Entered By: Christin Fudge on 09/25/2014 13:21:56 [name] ([id number]) -------------------------------------------------------------------------------- SuperBill Details Patient Name: [name] Date of Service: 09/25/2014 Medical Record Number: [id number] Patient Account Number: [id number] Date of Birth/Sex: 1922-06-21 (81 y.o. Female) Treating RN: Primary Care Physician: Einar Pheasant Other Clinician: Referring Physician: Einar Pheasant Treating Physician/Extender: Frann Rider in Treatment: 2 Diagnosis Coding ICD-10 Codes Code Description [phone] Atherosclerosis of native arteries of left leg with ulceration of other part of lower left leg L97.222 Non-pressure chronic ulcer of left calf with fat layer exposed L03.116 Cellulitis of left lower limb Facility Procedures CPT4: Description Modifier Quantity Code [id number] 11042 - DEB SUBQ TISSUE 20 SQ CM/< 1 ICD-10 Description Diagnosis I70.248 Atherosclerosis of native arteries of left leg with ulceration of other part of lower left leg L97.222 Non-pressure chronic ulcer  of left calf with fat layer  exposed L03.116 Cellulitis of left lower limb CPT4: [id number] 11045 - DEB SUBQ TISS EA ADDL 20CM 1 ICD-10 Description Diagnosis L03.116 Cellulitis of left lower limb L97.222 Non-pressure chronic ulcer of left calf with fat layer exposed I70.248 Atherosclerosis of native arteries of left leg with  ulceration of other part of lower left leg Physician Procedures CPT4: Description Modifier Quantity Code [id number] 11042 - WC PHYS SUBQ TISS 20 SQ CM 1 ICD-10 Description Diagnosis I70.248 Atherosclerosis of native arteries of left leg with ulceration of other part of lower left leg L97.222 Non-pressure chronic ulcer  of left calf with fat layer exposed L03.116 Cellulitis of left lower limb MARLINA, [name] ([id number]) Electronic Signature(s) Signed: 09/25/2014 1:22:13 PM By: Christin Fudge MD, FACS Entered By: Christin Fudge on 09/25/2014 13:22:13

## 2014-10-02 ENCOUNTER — Encounter: Payer: Medicare Other | Admitting: Surgery

## 2014-10-02 ENCOUNTER — Telehealth: Payer: Self-pay | Admitting: *Deleted

## 2014-10-02 DIAGNOSIS — L97222 Non-pressure chronic ulcer of left calf with fat layer exposed: Secondary | ICD-10-CM | POA: Diagnosis not present

## 2014-10-02 NOTE — Progress Notes (Signed)
[name] ([id number]) Visit Report for 10/02/2014 Arrival Information Details Patient Name: [name]. Date of Service: 10/02/2014 1:45 PM Medical Record Number: [id number] Patient Account Number: [id number] Date of Birth/Sex: 1922/10/25 (81 y.o. Female) Treating RN: Afful, RN, BSN, Velva Harman Primary Care Physician: Einar Pheasant Other Clinician: Referring Physician: Einar Pheasant Treating Physician/Extender: Frann Rider in Treatment: 3 Visit Information History Since Last Visit Any new allergies or adverse reactions: No Patient Arrived: Wheel Chair Had a fall or experienced change in No Arrival Time: 13:52 activities of daily [name] that may affect Accompanied By: caregiver risk of falls: Transfer Assistance: None Signs or symptoms of abuse/neglect since last No Patient Identification Verified: Yes visito Secondary Verification Process Yes Has Dressing in Place as Prescribed: Yes Completed: Pain Present Now: Yes Patient Requires Transmission- No Based Precautions: Patient Has Alerts: Yes Patient Alerts: Patient on Blood Thinner Eliquis. R ABI:0.96 Electronic Signature(s) Signed: 10/02/2014 1:53:14 PM By: Regan Lemming BSN, RN Entered By: Regan Lemming on 10/02/2014 13:53:13 [name] ([id number]) -------------------------------------------------------------------------------- Clinic Level of Care Assessment Details Patient Name: [name]. Date of Service: 10/02/2014 1:45 PM Medical Record Number: [id number] Patient Account Number: [id number] Date of Birth/Sex: October 16, 1922 (81 y.o. Female) Treating RN: Afful, RN, BSN, Velva Harman Primary Care Physician: Einar Pheasant Other Clinician: Referring Physician: Einar Pheasant Treating Physician/Extender: Frann Rider in Treatment: 3 Clinic Level of Care Assessment Items TOOL 4 Quantity Score []  - Use when only an EandM is performed on FOLLOW-UP visit 0 ASSESSMENTS - Nursing Assessment /  Reassessment X - Reassessment of Co-morbidities (includes updates in patient status) 1 10 X - Reassessment of Adherence to Treatment Plan 1 5 ASSESSMENTS - Wound and Skin Assessment / Reassessment X - Simple Wound Assessment / Reassessment - one wound 1 5 []  - Complex Wound Assessment / Reassessment - multiple wounds 0 []  - Dermatologic / Skin Assessment (not related to wound area) 0 ASSESSMENTS - Focused Assessment []  - Circumferential Edema Measurements - multi extremities 0 []  - Nutritional Assessment / Counseling / Intervention 0 X - Lower Extremity Assessment (monofilament, tuning fork, pulses) 1 5 []  - Peripheral Arterial Disease Assessment (using hand held doppler) 0 ASSESSMENTS - Ostomy and/or Continence Assessment and Care []  - Incontinence Assessment and Management 0 []  - Ostomy Care Assessment and Management (repouching, etc.) 0 PROCESS - Coordination of Care X - Simple Patient / Family Education for ongoing care 1 15 []  - Complex (extensive) Patient / Family Education for ongoing care 0 []  - Staff obtains Programmer, systems, Records, Test Results / Process Orders 0 []  - Staff telephones HHA, Nursing Homes / Clarify orders / etc 0 []  - Routine Transfer to another Facility (non-emergent condition) 0 [name], [name] ([id number]) []  - Routine Hospital Admission (non-emergent condition) 0 []  - New Admissions / Biomedical engineer / Ordering NPWT, Apligraf, etc. 0 []  - Emergency Hospital Admission (emergent condition) 0 []  - Simple Discharge Coordination 0 []  - Complex (extensive) Discharge Coordination 0 PROCESS - Special Needs []  - Pediatric / Minor Patient Management 0 []  - Isolation Patient Management 0 []  - Hearing / Language / Visual special needs 0 []  - Assessment of Community assistance (transportation, D/C planning, etc.) 0 []  - Additional assistance / Altered mentation 0 []  - Support Surface(s) Assessment (bed, cushion, seat, etc.) 0 INTERVENTIONS - Wound Cleansing /  Measurement X - Simple Wound Cleansing - one wound 1 5 []  - Complex Wound Cleansing - multiple wounds 0 X - Wound Imaging (photographs - any number of  wounds) 1 5 []  - Wound Tracing (instead of photographs) 0 X - Simple Wound Measurement - one wound 1 5 []  - Complex Wound Measurement - multiple wounds 0 INTERVENTIONS - Wound Dressings []  - Small Wound Dressing one or multiple wounds 0 X - Medium Wound Dressing one or multiple wounds 1 15 []  - Large Wound Dressing one or multiple wounds 0 []  - Application of Medications - topical 0 []  - Application of Medications - injection 0 INTERVENTIONS - Miscellaneous []  - External ear exam 0 [name], [name] ([id number]) []  - Specimen Collection (cultures, biopsies, blood, body fluids, etc.) 0 []  - Specimen(s) / Culture(s) sent or taken to Lab for analysis 0 []  - Patient Transfer (multiple staff / Harrel Lemon Lift / Similar devices) 0 []  - Simple Staple / Suture removal (25 or less) 0 []  - Complex Staple / Suture removal (26 or more) 0 []  - Hypo / Hyperglycemic Management (close monitor of Blood Glucose) 0 []  - Ankle / Brachial Index (ABI) - do not check if billed separately 0 X - Vital Signs 1 5 Has the patient been seen at the hospital within the last three years: Yes Total Score: 75 Level Of Care: New/Established - Level 2 Electronic Signature(s) Signed: 10/02/2014 2:25:13 PM By: Regan Lemming BSN, RN Entered By: Regan Lemming on 10/02/2014 14:25:13 [name] ([id number]) -------------------------------------------------------------------------------- Encounter Discharge Information Details Patient Name: [name]. Date of Service: 10/02/2014 1:45 PM Medical Record Number: [id number] Patient Account Number: [id number] Date of Birth/Sex: January 07, 1923 (81 y.o. Female) Treating RN: Afful, RN, BSN, Velva Harman Primary Care Physician: Einar Pheasant Other Clinician: Referring Physician: Einar Pheasant Treating Physician/Extender: Frann Rider in Treatment: 3 Encounter Discharge Information Items Discharge Pain Level: 0 Discharge Condition: Stable Ambulatory Status: Wheelchair Discharge Destination: Nursing Home Transportation: Other Accompanied By: dtr Schedule Follow-up Appointment: No Medication Reconciliation completed and provided to Patient/Care No Cali Cuartas: Provided on Clinical Summary of Care: 10/02/2014 Form Type Recipient Paper Patient LS Electronic Signature(s) Signed: 10/02/2014 2:27:45 PM By: Ruthine Dose Previous Signature: 10/02/2014 2:26:47 PM Version By: Regan Lemming BSN, RN Entered By: Ruthine Dose on 10/02/2014 14:27:44 [name] ([id number]) -------------------------------------------------------------------------------- Lower Extremity Assessment Details Patient Name: [name]. Date of Service: 10/02/2014 1:45 PM Medical Record Number: [id number] Patient Account Number: [id number] Date of Birth/Sex: Jun 20, 1922 (81 y.o. Female) Treating RN: Afful, RN, BSN, Velva Harman Primary Care Physician: Einar Pheasant Other Clinician: Referring Physician: Einar Pheasant Treating Physician/Extender: Frann Rider in Treatment: 3 Edema Assessment Assessed: [Left: No] [Right: No] E[Left: dema] [Right: :] Calf Left: Right: Point of Measurement: 37 cm From Medial Instep 40.1 cm cm Ankle Left: Right: Point of Measurement: 12 cm From Medial Instep 26.4 cm cm Vascular Assessment Pulses: Posterior Tibial Dorsalis Pedis Palpable: [Left:Yes] Extremity colors, hair growth, and conditions: Extremity Color: [Left:Mottled] Hair Growth on Extremity: [Left:No] Temperature of Extremity: [Left:Warm] Capillary Refill: [Left:< 3 seconds] Toe Nail Assessment Left: Right: Thick: Yes Discolored: Yes Deformed: Yes Improper Length and Hygiene: Yes Electronic Signature(s) Signed: 10/02/2014 1:56:42 PM By: Regan Lemming BSN, RN Entered By: Regan Lemming on 10/02/2014 13:56:42 Flewellen, Dolly Rias  ([id number]) -------------------------------------------------------------------------------- Multi Wound Chart Details Patient Name: [name]. Date of Service: 10/02/2014 1:45 PM Medical Record Number: [id number] Patient Account Number: [id number] Date of Birth/Sex: 12/01/1922 (81 y.o. Female) Treating RN: Baruch Gouty, RN, BSN, Velva Harman Primary Care Physician: Einar Pheasant Other Clinician: Referring Physician: Einar Pheasant Treating Physician/Extender: Frann Rider in Treatment: 3 Vital Signs Height(in): 67 Pulse(bpm): 108 Weight(lbs): 159 Blood  Pressure 164/92 (mmHg): Body Mass Index(BMI): 25 Temperature(F): 97.8 Respiratory Rate 18 (breaths/min): Photos: [1:No Photos] [N/A:N/A] Wound Location: [1:Left Lower Leg - Lateral] [N/A:N/A] Wounding Event: [1:Gradually Appeared] [N/A:N/A] Primary Etiology: [1:Venous Leg Ulcer] [N/A:N/A] Secondary Etiology: [1:Cellulitis] [N/A:N/A] Comorbid History: [1:Cataracts, Arrhythmia, Hypertension, Peripheral Arterial Disease, Peripheral Venous Disease, Osteoarthritis, Confinement Anxiety] [N/A:N/A] Date Acquired: [1:08/17/2014] [N/A:N/A] Weeks of Treatment: [1:3] [N/A:N/A] Wound Status: [1:Open] [N/A:N/A] Measurements L x W x D 7.4x3.2x0.2 [N/A:N/A] (cm) Area (cm) : [1:18.598] [N/A:N/A] Volume (cm) : [1:3.72] [N/A:N/A] % Reduction in Area: [1:29.50%] [N/A:N/A] % Reduction in Volume: 29.50% [N/A:N/A] Classification: [1:Full Thickness Without Exposed Support Structures] [N/A:N/A] Exudate Amount: [1:Medium] [N/A:N/A] Exudate Type: [1:Serosanguineous] [N/A:N/A] Exudate Color: [1:red, brown] [N/A:N/A] Wound Margin: [1:Distinct, outline attached] [N/A:N/A] Granulation Amount: [1:Medium (34-66%)] [N/A:N/A] Granulation Quality: [1:Red, Pink] [N/A:N/A] Necrotic Amount: [1:Medium (34-66%)] [N/A:N/A] Exposed Structures: Fascia: No N/A N/A Fat: No Tendon: No Muscle: No Joint: No Bone: No Limited to  Skin Breakdown Epithelialization: Small (1-33%) N/A N/A Periwound Skin Texture: Edema: Yes N/A N/A Excoriation: No Induration: No Callus: No Crepitus: No Fluctuance: No Friable: No Rash: No Scarring: No Periwound Skin Moist: Yes N/A N/A Moisture: Maceration: No Dry/Scaly: No Periwound Skin Color: Mottled: Yes N/A N/A Atrophie Blanche: No Cyanosis: No Ecchymosis: No Erythema: No Hemosiderin Staining: No Pallor: No Rubor: No Temperature: No Abnormality N/A N/A Tenderness on Yes N/A N/A Palpation: Wound Preparation: Ulcer Cleansing: N/A N/A Rinsed/Irrigated with Saline Topical Anesthetic Applied: Other: lidocaine 4% Treatment Notes Electronic Signature(s) Signed: 10/02/2014 2:13:40 PM By: Regan Lemming BSN, RN Entered By: Regan Lemming on 10/02/2014 14:13:40 [name] ([id number]) -------------------------------------------------------------------------------- [place] Details Patient Name: [name] Date of Service: 10/02/2014 1:45 PM Medical Record Number: [id number] Patient Account Number: [id number] Date of Birth/Sex: 01-22-1923 (81 y.o. Female) Treating RN: Afful, RN, BSN, Velva Harman Primary Care Physician: Einar Pheasant Other Clinician: Referring Physician: Einar Pheasant Treating Physician/Extender: Frann Rider in Treatment: 3 Active Inactive Abuse / Safety / Falls / Self Care Management Nursing Diagnoses: Potential for falls Goals: Patient will remain injury free Date Initiated: 09/07/2014 Goal Status: Active Interventions: Assess fall risk on admission and as needed Notes: Nutrition Nursing Diagnoses: Potential for alteratiion in Nutrition/Potential for imbalanced nutrition Goals: Patient/caregiver agrees to and verbalizes understanding of need to use nutritional supplements and/or vitamins as prescribed Date Initiated: 09/07/2014 Goal Status: Active Interventions: Assess patient nutrition upon admission and as  needed per policy Notes: Orientation to the Wound Care Program Nursing Diagnoses: Knowledge deficit related to the wound healing center program Goals: Patient/caregiver will verbalize understanding of the [place], [place] ([id number]) Date Initiated: 09/07/2014 Goal Status: Active Interventions: Provide education on orientation to the wound center Notes: Wound/Skin Impairment Nursing Diagnoses: Impaired tissue integrity Goals: Patient/caregiver will verbalize understanding of skin care regimen Date Initiated: 09/07/2014 Goal Status: Active Ulcer/skin breakdown will heal within 14 weeks Date Initiated: 09/07/2014 Goal Status: Active Interventions: Assess patient/caregiver ability to obtain necessary supplies Assess ulceration(s) every visit Notes: Electronic Signature(s) Signed: 10/02/2014 2:13:25 PM By: Regan Lemming BSN, RN Entered By: Regan Lemming on 10/02/2014 14:13:24 [name] ([id number]) -------------------------------------------------------------------------------- Pain Assessment Details Patient Name: [name]. Date of Service: 10/02/2014 1:45 PM Medical Record Number: [id number] Patient Account Number: [id number] Date of Birth/Sex: October 24, 1922 (81 y.o. Female) Treating RN: Baruch Gouty, RN, BSN, Velva Harman Primary Care Physician: Einar Pheasant Other Clinician: Referring Physician: Einar Pheasant Treating Physician/Extender: Frann Rider in Treatment: 3 Active Problems Location of Pain Severity and Description of Pain Patient Has  Paino Yes Site Locations Pain Location: Pain in Ulcers Rate the pain. Current Pain Level: 3 Character of Pain Describe the Pain: Aching, Tender Pain Management and Medication Current Pain Management: How does your pain impact your activities of daily livingo Sleep: Yes Bathing: Yes Appetite: Yes Relationship With Others: Yes Bladder Continence: Yes Emotions: Yes Bowel Continence: Yes Work:  Yes Toileting: Yes Drive: Yes Dressing: Yes Hobbies: Yes Electronic Signature(s) Signed: 10/02/2014 1:53:31 PM By: Regan Lemming BSN, RN Entered By: Regan Lemming on 10/02/2014 13:53:30 [name] ([id number]) -------------------------------------------------------------------------------- Patient/Caregiver Education Details Patient Name: [name] Date of Service: 10/02/2014 1:45 PM Medical Record Number: [id number] Patient Account Number: [id number] Date of Birth/Gender: 08-15-1922 (81 y.o. Female) Treating RN: Afful, RN, BSN, Velva Harman Primary Care Physician: Einar Pheasant Other Clinician: Referring Physician: Einar Pheasant Treating Physician/Extender: Frann Rider in Treatment: 3 Education Assessment Education Provided To: Patient and Caregiver Education Topics Provided Welcome To [facility]: Methods: Explain/Verbal Responses: State content correctly Electronic Signature(s) Signed: 10/02/2014 2:26:57 PM By: Regan Lemming BSN, RN Entered By: Regan Lemming on 10/02/2014 14:26:57 [name] ([id number]) -------------------------------------------------------------------------------- Wound Assessment Details Patient Name: [name]. Date of Service: 10/02/2014 1:45 PM Medical Record Number: [id number] Patient Account Number: [id number] Date of Birth/Sex: September 10, 1922 (81 y.o. Female) Treating RN: Afful, RN, BSN, Velva Harman Primary Care Physician: Einar Pheasant Other Clinician: Referring Physician: Einar Pheasant Treating Physician/Extender: Frann Rider in Treatment: 3 Wound Status Wound Number: 1 Primary Venous Leg Ulcer Etiology: Wound Location: Left Lower Leg - Lateral Secondary Cellulitis Wounding Event: Gradually Appeared Etiology: Date Acquired: 08/17/2014 Wound Open Weeks Of Treatment: 3 Status: Clustered Wound: No Comorbid Cataracts, Arrhythmia, Hypertension, History: Peripheral Arterial Disease, Peripheral Venous Disease,  Osteoarthritis, Confinement Anxiety Photos Photo Uploaded By: Regan Lemming on 10/02/2014 16:05:28 Wound Measurements Length: (cm) 7.4 Width: (cm) 3.2 Depth: (cm) 0.2 Area: (cm) 18.598 Volume: (cm) 3.72 % Reduction in Area: 29.5% % Reduction in Volume: 29.5% Epithelialization: Small (1-33%) Tunneling: No Wound Description Full Thickness Without Exposed Classification: Support Structures Wound Margin: Distinct, outline attached Exudate Medium Amount: Exudate Type: Serosanguineous Exudate Color: red, brown Tillison, Dicie L. ([id number]) Foul Odor After Cleansing: No Wound Bed Granulation Amount: Medium (34-66%) Exposed Structure Granulation Quality: Red, Pink Fascia Exposed: No Necrotic Amount: Medium (34-66%) Fat Layer Exposed: No Necrotic Quality: Adherent Slough Tendon Exposed: No Muscle Exposed: No Joint Exposed: No Bone Exposed: No Limited to Skin Breakdown Periwound Skin Texture Texture Color No Abnormalities Noted: No No Abnormalities Noted: No Callus: No Atrophie Blanche: No Crepitus: No Cyanosis: No Excoriation: No Ecchymosis: No Fluctuance: No Erythema: No Friable: No Hemosiderin Staining: No Induration: No Mottled: Yes Localized Edema: Yes Pallor: No Rash: No Rubor: No Scarring: No Temperature / Pain Moisture Temperature: No Abnormality No Abnormalities Noted: No Tenderness on Palpation: Yes Dry / Scaly: No Maceration: No Moist: Yes Wound Preparation Ulcer Cleansing: Rinsed/Irrigated with Saline Topical Anesthetic Applied: Other: lidocaine 4%, Treatment Notes Wound #1 (Left, Lateral Lower Leg) 1. Cleansed with: Clean wound with Normal Saline 4. Dressing Applied: Santyl Ointment 5. Secondary Dressing Applied Guaze, ABD and kerlix/Conform 7. Secured with Recruitment consultant) Signed: 10/02/2014 2:13:06 PM By: Regan Lemming BSN, RN Entered By: Regan Lemming on 10/02/2014 14:13:06 [name] ([id number] Dresser, Dolly Rias ([id number]) -------------------------------------------------------------------------------- Vitals Details Patient Name: [name] Date of Service: 10/02/2014 1:45 PM Medical Record Number: [id number] Patient Account Number: [id number] Date of Birth/Sex: 1922-06-05 (81 y.o. Female) Treating RN: Afful, RN, BSN, [facility] Primary Care Physician:  Einar Pheasant Other Clinician: Referring Physician: Einar Pheasant Treating Physician/Extender: Frann Rider in Treatment: 3 Vital Signs Time Taken: 13:55 Temperature (F): 97.8 Height (in): 67 Pulse (bpm): 108 Weight (lbs): 159 Respiratory Rate (breaths/min): 18 Body Mass Index (BMI): 24.9 Blood Pressure (mmHg): 164/92 Reference Range: 80 - 120 mg / dl Electronic Signature(s) Signed: 10/02/2014 1:55:48 PM By: Regan Lemming BSN, RN Entered By: Regan Lemming on 10/02/2014 13:55:48

## 2014-10-02 NOTE — Progress Notes (Addendum)
[name] ([id number]) Visit Report for 10/02/2014 Chief Complaint Document Details Patient Name: [name], [name]. Date of Service: 10/02/2014 1:45 PM Medical Record Number: [id number] Patient Account Number: [id number] Date of Birth/Sex: [birth year] (81 y.o. Female) Treating RN: Cornell Barman Primary Care Physician: Einar Pheasant Other Clinician: Referring Physician: Einar Pheasant Treating Physician/Extender: Frann Rider in Treatment: 3 Information Obtained from: Patient Chief Complaint Patient presents to the wound care center today with an open arterial ulcer. The patient has had left lower extremity ulceration for about 1 month. Electronic Signature(s) Signed: 10/02/2014 2:17:18 PM By: Christin Fudge MD, FACS Entered By: Christin Fudge on 10/02/2014 14:17:17 [name] ([id number]) -------------------------------------------------------------------------------- HPI Details Patient Name: [name] Date of Service: 10/02/2014 1:45 PM Medical Record Number: [id number] Patient Account Number: [id number] Date of Birth/Sex: September 28, 1922 (81 y.o. Female) Treating RN: Cornell Barman Primary Care Physician: Einar Pheasant Other Clinician: Referring Physician: Einar Pheasant Treating Physician/Extender: Frann Rider in Treatment: 3 History of Present Illness Location: left lower extremity ulceration Quality: Patient reports experiencing burning to affected area(s). Severity: Patient states wound are getting worse. Duration: Patient has had the wound for < 4 weeks prior to presenting for treatment Timing: Pain in wound is constant (hurts all the time) Context: The wound appeared gradually over time Modifying Factors: Other treatment(s) tried include:has been on oral anti-buttocks since the beginning of the month and had 10 days of Augmentin and now is on doxycycline. Associated Signs and Symptoms: Patient reports having difficulty standing for long  periods. HPI Description: 81 year old patient was admitted to [facility] between 08/13/2014 and 08/17/2014 with cellulitis of the left lower extremity.duplex study done then showed no DVT and the patient was on IV vancomycin and Rocephin and also received Unasyn later. The patient was sent home on 10 days of Augmentin. Her other comorbidities of atrial fibrillation, essential hypertension, diabetes mellitus type 2, coronary artery disease were managed with appropriate medications. on 09/04/2014 she was seen in the ER and was found to have a 10 cm ulceration on the left lower extremity lateral and superior to the lateral malleolus. The granulation tissue was noted and she was referred to the wound center. the ER notes that her ABIs were within normal limits and she was sent home on consultative management. a duplex scan of the lower extremities was done by the nursing home and this showed that the right ABI was 0.96 with a multiphasic waveform throughout the right leg. There was a monophasic waveform of the left leg suggesting left external iliac artery stenosis with blunting distally and the ABI could not be calculated on the left side. Moderate to severe ischemia was suspected of the left leg and a MR angiogram was recommended. 09/17/2014 -- Xray Left tibia and fibula --- unremarkable tibia and fibula Her wound culture is back and this has grown a heavy growth of staph aureus sensitive to ciprofloxacin and clindamycin and Bactrim. She has been on doxycycline since July 14 for 10 days and we will review her clinically before switching her antibiotics if need be. After reviewing her chart she is not going to be here till 09/17/2014 and hence I will call in Bactrim DS 1 twice a day for 14 days. She has seen Dr. Lucky Cowboy for an opinion and she is scheduled a procedure on this coming Thursday. other than that the pain and swelling continues and she has no other acute  problems. 09/25/2014 -- she had a recent procedure done by Dr. Lucky Cowboy  for diffuse SFA stenosis, popliteal occlusion and tibial disease. she had at angioplasty of the left peroneal artery and tibioperoneal trunk, left superficial femoral artery, left popliteal artery, distal SFA and popliteal artery. the patient is experiencing some swelling of the left lower extremity after the procedure. MISHIKA, FLIPPEN ([id number]) Electronic Signature(s) Signed: 10/02/2014 2:17:52 PM By: Christin Fudge MD, FACS Entered By: Christin Fudge on 10/02/2014 14:17:51 [name] ([id number]) -------------------------------------------------------------------------------- Physical Exam Details Patient Name: [name] Date of Service: 10/02/2014 1:45 PM Medical Record Number: [id number] Patient Account Number: [id number] Date of Birth/Sex: 12-Jun-1922 (81 y.o. Female) Treating RN: Cornell Barman Primary Care Physician: Einar Pheasant Other Clinician: Referring Physician: Einar Pheasant Treating Physician/Extender: Frann Rider in Treatment: 3 Constitutional . Pulse regular. Respirations normal and unlabored. Afebrile. . Eyes Nonicteric. Reactive to light. Ears, Nose, Mouth, and Throat Lips, teeth, and gums WNL.Marland Kitchen Moist mucosa without lesions . Neck supple and nontender. No palpable supraclavicular or cervical adenopathy. Normal sized without goiter. Respiratory WNL. No retractions.. Cardiovascular Pedal Pulses WNL but weakly palpable.. the edema is much less and the wound on the left lower extremity looks much cleaner.. Lymphatic No adneopathy. No adenopathy. No adenopathy. Musculoskeletal Adexa without tenderness or enlargement.. Digits and nails w/o clubbing, cyanosis, infection, petechiae, ischemia, or inflammatory conditions.. Integumentary (Hair, Skin) No suspicious lesions. No crepitus or fluctuance. No peri-wound warmth or erythema. No masses.Marland Kitchen Psychiatric Judgement and insight  Intact.. No evidence of depression, anxiety, or agitation.. Notes The wound on the left lower extremity in the lateral aspect looks much cleaner than before and has minimal need of debridement today. Electronic Signature(s) Signed: 10/02/2014 2:19:03 PM By: Christin Fudge MD, FACS Entered By: Christin Fudge on 10/02/2014 [place], [place]. ([id number]) -------------------------------------------------------------------------------- Physician Orders Details Patient Name: [name] Date of Service: 10/02/2014 1:45 PM Medical Record Patient Account Number: [id number] Number: Afful, RN, BSN, Treating RN: January 30, 1923 (81 y.o. Velva Harman Date of Birth/Sex: Female) Other Clinician: Primary Care Physician: Einar Pheasant Treating Christin Fudge Referring Physician: Einar Pheasant Physician/Extender: Suella Grove in Treatment: 3 Verbal / Phone Orders: Yes Clinician: Afful, RN, BSN, Rita Read Back and Verified: Yes Diagnosis Coding Wound Cleansing Wound #1 Left,Lateral Lower Leg o Cleanse wound with mild soap and water o May Shower, gently pat wound dry prior to applying new dressing. o May shower with protection. Anesthetic Wound #1 Left,Lateral Lower Leg o Topical Lidocaine 4% cream applied to wound bed prior to debridement Primary Wound Dressing Wound #1 Left,Lateral Lower Leg o Santyl Ointment Secondary Dressing Wound #1 Left,Lateral Lower Leg o Gauze, ABD and Kerlix/Conform Dressing Change Frequency Wound #1 Left,Lateral Lower Leg o Change dressing every day. Follow-up Appointments Wound #1 Left,Lateral Lower Leg o Return Appointment in 1 week. Additional Orders / Instructions Wound #1 Left,Lateral Lower Leg o Increase protein intake. Electronic Signature(s) Signed: 10/02/2014 2:16:54 PM By: Regan Lemming BSN, RN [name] ([id number]) Signed: 10/02/2014 3:28:23 PM By: Christin Fudge MD, FACS Entered By: Regan Lemming on 10/02/2014  14:16:54 [name] ([id number]) -------------------------------------------------------------------------------- Problem List Details Patient Name: MAUDENE, STOTLER. Date of Service: 10/02/2014 1:45 PM Medical Record Number: [id number] Patient Account Number: [id number] Date of Birth/Sex: June 06, 1922 (81 y.o. Female) Treating RN: Cornell Barman Primary Care Physician: Einar Pheasant Other Clinician: Referring Physician: Einar Pheasant Treating Physician/Extender: Frann Rider in Treatment: 3 Active Problems ICD-10 Encounter Code Description Active Date Diagnosis I70.248 Atherosclerosis of native arteries of left leg with ulceration 09/07/2014 Yes of other part of lower left leg L97.222  Non-pressure chronic ulcer of left calf with fat layer 09/07/2014 Yes exposed L03.116 Cellulitis of left lower limb 09/07/2014 Yes Inactive Problems Resolved Problems Electronic Signature(s) Signed: 10/02/2014 2:16:28 PM By: Christin Fudge MD, FACS Entered By: Christin Fudge on 10/02/2014 14:16:28 [name] ([id number]) -------------------------------------------------------------------------------- Progress Note Details Patient Name: [name]. Date of Service: 10/02/2014 1:45 PM Medical Record Number: [id number] Patient Account Number: [id number] Date of Birth/Sex: Dec 23, 1922 (81 y.o. Female) Treating RN: Cornell Barman Primary Care Physician: Einar Pheasant Other Clinician: Referring Physician: Einar Pheasant Treating Physician/Extender: Frann Rider in Treatment: 3 Subjective Chief Complaint Information obtained from Patient Patient presents to the wound care center today with an open arterial ulcer. The patient has had left lower extremity ulceration for about 1 month. History of Present Illness (HPI) The following HPI elements were documented for the patient's wound: Location: left lower extremity ulceration Quality: Patient reports experiencing burning to  affected area(s). Severity: Patient states wound are getting worse. Duration: Patient has had the wound for < 4 weeks prior to presenting for treatment Timing: Pain in wound is constant (hurts all the time) Context: The wound appeared gradually over time Modifying Factors: Other treatment(s) tried include:has been on oral anti-buttocks since the beginning of the month and had 10 days of Augmentin and now is on doxycycline. Associated Signs and Symptoms: Patient reports having difficulty standing for long periods. 81 year old patient was admitted to [facility] between 08/13/2014 and 08/17/2014 with cellulitis of the left lower extremity.duplex study done then showed no DVT and the patient was on IV vancomycin and Rocephin and also received Unasyn later. The patient was sent home on 10 days of Augmentin. Her other comorbidities of atrial fibrillation, essential hypertension, diabetes mellitus type 2, coronary artery disease were managed with appropriate medications. on 09/04/2014 she was seen in the ER and was found to have a 10 cm ulceration on the left lower extremity lateral and superior to the lateral malleolus. The granulation tissue was noted and she was referred to the wound center. the ER notes that her ABIs were within normal limits and she was sent home on consultative management. a duplex scan of the lower extremities was done by the nursing home and this showed that the right ABI was 0.96 with a multiphasic waveform throughout the right leg. There was a monophasic waveform of the left leg suggesting left external iliac artery stenosis with blunting distally and the ABI could not be calculated on the left side. Moderate to severe ischemia was suspected of the left leg and a MR angiogram was recommended. 09/17/2014 -- Xray Left tibia and fibula --- unremarkable tibia and fibula Her wound culture is back and this has grown a heavy growth of staph aureus sensitive  to ciprofloxacin and clindamycin and Bactrim. She has been on doxycycline since July 14 for 10 days and we will review her clinically before switching her antibiotics if need be. After reviewing her chart she is not going to be here till 09/17/2014 and hence I will Alix, Chalonda L. ([id number]) call in Bactrim DS 1 twice a day for 14 days. She has seen Dr. Lucky Cowboy for an opinion and she is scheduled a procedure on this coming Thursday. other than that the pain and swelling continues and she has no other acute problems. 09/25/2014 -- she had a recent procedure done by Dr. Lucky Cowboy for diffuse SFA stenosis, popliteal occlusion and tibial disease. she had at angioplasty of the left peroneal artery and tibioperoneal trunk, left  superficial femoral artery, left popliteal artery, distal SFA and popliteal artery. the patient is experiencing some swelling of the left lower extremity after the procedure. Objective Constitutional Pulse regular. Respirations normal and unlabored. Afebrile. Vitals Time Taken: 1:55 PM, Height: 67 in, Weight: 159 lbs, BMI: 24.9, Temperature: 97.8 F, Pulse: 108 bpm, Respiratory Rate: 18 breaths/min, Blood Pressure: 164/92 mmHg. Eyes Nonicteric. Reactive to light. Ears, Nose, Mouth, and Throat Lips, teeth, and gums WNL.Marland Kitchen Moist mucosa without lesions . Neck supple and nontender. No palpable supraclavicular or cervical adenopathy. Normal sized without goiter. Respiratory WNL. No retractions.. Cardiovascular Pedal Pulses WNL but weakly palpable.. the edema is much less and the wound on the left lower extremity looks much cleaner.. Lymphatic No adneopathy. No adenopathy. No adenopathy. Musculoskeletal Adexa without tenderness or enlargement.. Digits and nails w/o clubbing, cyanosis, infection, petechiae, ischemia, or inflammatory conditions.Marland Kitchen Psychiatric Judgement and insight Intact.. No evidence of depression, anxiety, or agitation.Marland Kitchen NABILA, ALBARRACIN ([id number]) General  Notes: The wound on the left lower extremity in the lateral aspect looks much cleaner than before and has minimal need of debridement today. Integumentary (Hair, Skin) No suspicious lesions. No crepitus or fluctuance. No peri-wound warmth or erythema. No masses.. Wound #1 status is Open. Original cause of wound was Gradually Appeared. The wound is located on the Left,Lateral Lower Leg. The wound measures 7.4cm length x 3.2cm width x 0.2cm depth; 18.598cm^2 area and 3.72cm^3 volume. The wound is limited to skin breakdown. There is no tunneling noted. There is a medium amount of serosanguineous drainage noted. The wound margin is distinct with the outline attached to the wound base. There is medium (34-66%) red, pink granulation within the wound bed. There is a medium (34-66%) amount of necrotic tissue within the wound bed including Adherent Slough. The periwound skin appearance exhibited: Localized Edema, Moist, Mottled. The periwound skin appearance did not exhibit: Callus, Crepitus, Excoriation, Fluctuance, Friable, Induration, Rash, Scarring, Dry/Scaly, Maceration, Atrophie Blanche, Cyanosis, Ecchymosis, Hemosiderin Staining, Pallor, Rubor, Erythema. Periwound temperature was noted as No Abnormality. The periwound has tenderness on palpation. Assessment Active Problems ICD-10 I70.248 - Atherosclerosis of native arteries of left leg with ulceration of other part of lower left leg L97.222 - Non-pressure chronic ulcer of left calf with fat layer exposed L03.116 - Cellulitis of left lower limb The wound has increased in healthy granulation tissue and the slough is now much less. She will continue using Santyl ointment locally and continue with elevation of the limb as much as possible. she will come back and see me next week. Plan Wound Cleansing: Wound #1 Left,Lateral Lower Leg: Cleanse wound with mild soap and water May Shower, gently pat wound dry prior to applying new dressing. May  shower with protection. Anesthetic: Wound #1 Left,Lateral Lower Leg: SAGE, HAMMILL. ([id number]) Topical Lidocaine 4% cream applied to wound bed prior to debridement Primary Wound Dressing: Wound #1 Left,Lateral Lower Leg: Santyl Ointment Secondary Dressing: Wound #1 Left,Lateral Lower Leg: Gauze, ABD and Kerlix/Conform Dressing Change Frequency: Wound #1 Left,Lateral Lower Leg: Change dressing every day. Follow-up Appointments: Wound #1 Left,Lateral Lower Leg: Return Appointment in 1 week. Additional Orders / Instructions: Wound #1 Left,Lateral Lower Leg: Increase protein intake. The wound has increased in healthy granulation tissue and the slough is now much less. She will continue using Santyl ointment locally and continue with elevation of the limb as much as possible. she will come back and see me next week. Electronic Signature(s) Signed: 10/02/2014 2:26:22 PM By: Christin Fudge MD, FACS Entered By: Con Memos  Hollye Pritt on 10/02/2014 14:26:22 THERASA, LORENZI ([id number]) -------------------------------------------------------------------------------- SuperBill Details Patient Name: EVIANNA, CHANDRAN. Date of Service: 10/02/2014 Medical Record Patient Account Number: [id number] Number: Afful, RN, BSN, Treating RN: 11-12-1922 (81 y.o. Velva Harman Date of Birth/Sex: Female) Other Clinician: Primary Care Physician: Einar Pheasant Treating Erline Siddoway Referring Physician: Einar Pheasant Physician/Extender: Suella Grove in Treatment: 3 Diagnosis Coding ICD-10 Codes Code Description I70.248 Atherosclerosis of native arteries of left leg with ulceration of other part of lower left leg L97.222 Non-pressure chronic ulcer of left calf with fat layer exposed L03.116 Cellulitis of left lower limb Facility Procedures CPT4 Code: [id number] Description: 09735 - WOUND CARE VISIT-LEV 2 EST PT Modifier: Quantity: 1 Physician Procedures CPT4: Description Modifier Quantity Code 3299242  99213 - WC PHYS LEVEL 3 - EST PT 1 ICD-10 Description Diagnosis I70.248 Atherosclerosis of native arteries of left leg with ulceration of other part of lower left leg L97.222 Non-pressure chronic ulcer of  left calf with fat layer exposed Electronic Signature(s) Signed: 10/02/2014 2:26:40 PM By: Christin Fudge MD, FACS Entered By: Christin Fudge on 10/02/2014 14:26:40

## 2014-10-02 NOTE — Telephone Encounter (Signed)
Patient's daughter request to talk to Dr. Nicki Reaper. Patient's daughter had questions pertaining to her mother's nursing facility care.[name]

## 2014-10-03 NOTE — Telephone Encounter (Signed)
Ok.  Thanks for clarifying. Let me know if there is something more I need to do.

## 2014-10-03 NOTE — Telephone Encounter (Signed)
Please advise appoint date/time.

## 2014-10-03 NOTE — Telephone Encounter (Signed)
Spoke with pts daughter, she states pt is still at [facility].  She states she received a letter from [facility] stating pt was to be discharged on Friday, 8.19.16.  However after speaking with Medicare, pt will be able to stay at [facility] up to her 100 day.  [name] states she does not need Dr Nicki Reaper at this time.

## 2014-10-03 NOTE — Telephone Encounter (Signed)
She has been in skilled nursing.  I did not know she had been discharged.  Need to know - what is going on and what questions?

## 2014-10-09 ENCOUNTER — Encounter: Payer: Medicare Other | Admitting: Surgery

## 2014-10-09 DIAGNOSIS — L97222 Non-pressure chronic ulcer of left calf with fat layer exposed: Secondary | ICD-10-CM | POA: Diagnosis not present

## 2014-10-09 NOTE — Progress Notes (Signed)
Kara, [name] ([id number]) Visit Report for 10/09/2014 Arrival Information Details Patient Name: [name], [name]. Date of Service: 10/09/2014 8:45 AM Medical Record Number: [id number] Patient Account Number: [id number] Date of Birth/Sex: April 13, 1922 (81 y.o. Female) Treating RN: Afful, RN, BSN, Velva Harman Primary Care Physician: Einar Pheasant Other Clinician: Referring Physician: Einar Pheasant Treating Physician/Extender: Frann Rider in Treatment: 4 Visit Information History Since Last Visit Added or deleted any medications: No Patient Arrived: Wheel Chair Any new allergies or adverse reactions: No Arrival Time: 08:49 Had a fall or experienced change in No Accompanied By: dtr activities of daily [name] that may affect Transfer Assistance: Manual risk of falls: Patient Identification Verified: Yes Signs or symptoms of abuse/neglect since last No Secondary Verification Process Yes visito Completed: Hospitalized since last visit: No Patient Requires Transmission- No Has Dressing in Place as Prescribed: Yes Based Precautions: Pain Present Now: No Patient Has Alerts: Yes Patient Alerts: Patient on Blood Thinner Eliquis. R ABI:0.96 Electronic Signature(s) Signed: 10/09/2014 8:51:21 AM By: Regan Lemming BSN, RN Entered By: Regan Lemming on 10/09/2014 08:51:21 [name] ([id number]) -------------------------------------------------------------------------------- Clinic Level of Care Assessment Details Patient Name: [name]. Date of Service: 10/09/2014 8:45 AM Medical Record Number: [id number] Patient Account Number: [id number] Date of Birth/Sex: 1922-06-15 (81 y.o. Female) Treating RN: Afful, RN, BSN, Velva Harman Primary Care Physician: Einar Pheasant Other Clinician: Referring Physician: Einar Pheasant Treating Physician/Extender: Frann Rider in Treatment: 4 Clinic Level of Care Assessment Items TOOL 4 Quantity Score []  - Use when only an EandM is  performed on FOLLOW-UP visit 0 ASSESSMENTS - Nursing Assessment / Reassessment X - Reassessment of Co-morbidities (includes updates in patient status) 1 10 X - Reassessment of Adherence to Treatment Plan 1 5 ASSESSMENTS - Wound and Skin Assessment / Reassessment X - Simple Wound Assessment / Reassessment - one wound 1 5 []  - Complex Wound Assessment / Reassessment - multiple wounds 0 []  - Dermatologic / Skin Assessment (not related to wound area) 0 ASSESSMENTS - Focused Assessment []  - Circumferential Edema Measurements - multi extremities 0 []  - Nutritional Assessment / Counseling / Intervention 0 X - Lower Extremity Assessment (monofilament, tuning fork, pulses) 1 5 []  - Peripheral Arterial Disease Assessment (using hand held doppler) 0 ASSESSMENTS - Ostomy and/or Continence Assessment and Care []  - Incontinence Assessment and Management 0 []  - Ostomy Care Assessment and Management (repouching, etc.) 0 PROCESS - Coordination of Care X - Simple Patient / Family Education for ongoing care 1 15 []  - Complex (extensive) Patient / Family Education for ongoing care 0 X - Staff obtains Programmer, systems, Records, Test Results / Process Orders 1 10 []  - Staff telephones HHA, Nursing Homes / Clarify orders / etc 0 []  - Routine Transfer to another Facility (non-emergent condition) 0 [place] ([id number]) []  - Routine Hospital Admission (non-emergent condition) 0 []  - New Admissions / Biomedical engineer / Ordering NPWT, Apligraf, etc. 0 []  - Emergency Hospital Admission (emergent condition) 0 []  - Simple Discharge Coordination 0 []  - Complex (extensive) Discharge Coordination 0 PROCESS - Special Needs []  - Pediatric / Minor Patient Management 0 []  - Isolation Patient Management 0 []  - Hearing / Language / Visual special needs 0 []  - Assessment of Community assistance (transportation, D/C planning, etc.) 0 []  - Additional assistance / Altered mentation 0 []  - Support Surface(s)  Assessment (bed, cushion, seat, etc.) 0 INTERVENTIONS - Wound Cleansing / Measurement X - Simple Wound Cleansing - one wound 1 5 []  - Complex Wound Cleansing -  multiple wounds 0 X - Wound Imaging (photographs - any number of wounds) 1 5 []  - Wound Tracing (instead of photographs) 0 X - Simple Wound Measurement - one wound 1 5 []  - Complex Wound Measurement - multiple wounds 0 INTERVENTIONS - Wound Dressings X - Small Wound Dressing one or multiple wounds 1 10 []  - Medium Wound Dressing one or multiple wounds 0 []  - Large Wound Dressing one or multiple wounds 0 []  - Application of Medications - topical 0 []  - Application of Medications - injection 0 INTERVENTIONS - Miscellaneous []  - External ear exam 0 Kimbell, Azlynn L. ([id number]) []  - Specimen Collection (cultures, biopsies, blood, body fluids, etc.) 0 []  - Specimen(s) / Culture(s) sent or taken to Lab for analysis 0 []  - Patient Transfer (multiple staff / Harrel Lemon Lift / Similar devices) 0 []  - Simple Staple / Suture removal (25 or less) 0 []  - Complex Staple / Suture removal (26 or more) 0 []  - Hypo / Hyperglycemic Management (close monitor of Blood Glucose) 0 []  - Ankle / Brachial Index (ABI) - do not check if billed separately 0 X - Vital Signs 1 5 Has the patient been seen at the hospital within the last three years: Yes Total Score: 80 Level Of Care: New/Established - Level 3 Electronic Signature(s) Signed: 10/09/2014 9:36:08 AM By: Regan Lemming BSN, RN Entered By: Regan Lemming on 10/09/2014 09:35:59 [name] ([id number]) -------------------------------------------------------------------------------- Encounter Discharge Information Details Patient Name: [name]. Date of Service: 10/09/2014 8:45 AM Medical Record Number: [id number] Patient Account Number: [id number] Date of Birth/Sex: Aug 29, 1922 (81 y.o. Female) Treating RN: Afful, RN, BSN, Velva Harman Primary Care Physician: Einar Pheasant Other  Clinician: Referring Physician: Einar Pheasant Treating Physician/Extender: Frann Rider in Treatment: 4 Encounter Discharge Information Items Discharge Pain Level: 0 Discharge Condition: Stable Ambulatory Status: Wheelchair Discharge Destination: Nursing Home Transportation: Other Accompanied By: dtr Schedule Follow-up Appointment: No Medication Reconciliation completed and provided to Patient/Care No Deondrae Mcgrail: Provided on Clinical Summary of Care: 10/09/2014 Form Type Recipient Paper Patient LS Electronic Signature(s) Signed: 10/09/2014 9:42:25 AM By: Regan Lemming BSN, RN Previous Signature: 10/09/2014 9:28:44 AM Version By: Ruthine Dose Entered By: Regan Lemming on 10/09/2014 09:42:24 Archbold, Dolly Rias ([id number]) -------------------------------------------------------------------------------- Lower Extremity Assessment Details Patient Name: [name]. Date of Service: 10/09/2014 8:45 AM Medical Record Number: [id number] Patient Account Number: [id number] Date of Birth/Sex: [birth year] (81 y.o. Female) Treating RN: Afful, RN, BSN, Velva Harman Primary Care Physician: Einar Pheasant Other Clinician: Referring Physician: Einar Pheasant Treating Physician/Extender: Frann Rider in Treatment: 4 Edema Assessment Assessed: [Left: No] [Right: No] Edema: [Left: Ye] [Right: s] Calf Left: Right: Point of Measurement: 37 cm From Medial Instep 38.7 cm cm Ankle Left: Right: Point of Measurement: 12 cm From Medial Instep 25.8 cm cm Vascular Assessment Pulses: Posterior Tibial Dorsalis Pedis Palpable: [Left:No] Doppler: [Left:Monophasic] Extremity colors, hair growth, and conditions: Extremity Color: [Left:Mottled] Hair Growth on Extremity: [Left:No] Temperature of Extremity: [Left:Warm] Capillary Refill: [Left:< 3 seconds] Dependent Rubor: [Left:No] Blanched when Elevated: [Left:No] Lipodermatosclerosis: [Left:No] Toe Nail Assessment Left: Right: Thick:  Yes Discolored: Yes Deformed: Yes Improper Length and Hygiene: Yes Electronic Signature(s) BEAU, VANDUZER ([id number]) Signed: 10/09/2014 8:58:48 AM By: Regan Lemming BSN, RN Entered By: Regan Lemming on 10/09/2014 08:58:48 [name] ([id number]) -------------------------------------------------------------------------------- Multi Wound Chart Details Patient Name: [name]. Date of Service: 10/09/2014 8:45 AM Medical Record Number: [id number] Patient Account Number: [id number] Date of Birth/Sex: 12/12/1922 (81 y.o. Female) Treating RN: Afful, RN, BSN, [employer]  Primary Care Physician: Einar Pheasant Other Clinician: Referring Physician: Einar Pheasant Treating Physician/Extender: Frann Rider in Treatment: 4 Vital Signs Height(in): 67 Pulse(bpm): 92 Weight(lbs): 159 Blood Pressure 137/74 (mmHg): Body Mass Index(BMI): 25 Temperature(F): 97.9 Respiratory Rate 18 (breaths/min): Photos: [1:No Photos] [N/A:N/A] Wound Location: [1:Left Lower Leg - Lateral] [N/A:N/A] Wounding Event: [1:Gradually Appeared] [N/A:N/A] Primary Etiology: [1:Venous Leg Ulcer] [N/A:N/A] Secondary Etiology: [1:Cellulitis] [N/A:N/A] Comorbid History: [1:Cataracts, Arrhythmia, Hypertension, Peripheral Arterial Disease, Peripheral Venous Disease, Osteoarthritis, Confinement Anxiety] [N/A:N/A] Date Acquired: [1:08/17/2014] [N/A:N/A] Weeks of Treatment: [1:4] [N/A:N/A] Wound Status: [1:Open] [N/A:N/A] Measurements L x W x D 7.2x3.3x0.2 [N/A:N/A] (cm) Area (cm) : [1:18.661] [N/A:N/A] Volume (cm) : [1:3.732] [N/A:N/A] % Reduction in Area: [1:29.30%] [N/A:N/A] % Reduction in Volume: 29.30% [N/A:N/A] Classification: [1:Full Thickness Without Exposed Support Structures] [N/A:N/A] Exudate Amount: [1:Medium] [N/A:N/A] Exudate Type: [1:Serosanguineous] [N/A:N/A] Exudate Color: [1:red, brown] [N/A:N/A] Wound Margin: [1:Distinct, outline attached] [N/A:N/A] Granulation Amount: [1:Medium  (34-66%)] [N/A:N/A] Granulation Quality: [1:Red, Pink] [N/A:N/A] Necrotic Amount: [1:Medium (34-66%)] [N/A:N/A] Exposed Structures: Fascia: No N/A N/A Fat: No Tendon: No Muscle: No Joint: No Bone: No Limited to Skin Breakdown Epithelialization: Small (1-33%) N/A N/A Periwound Skin Texture: Edema: Yes N/A N/A Excoriation: No Induration: No Callus: No Crepitus: No Fluctuance: No Friable: No Rash: No Scarring: No Periwound Skin Moist: Yes N/A N/A Moisture: Maceration: No Dry/Scaly: No Periwound Skin Color: Mottled: Yes N/A N/A Atrophie Blanche: No Cyanosis: No Ecchymosis: No Erythema: No Hemosiderin Staining: No Pallor: No Rubor: No Temperature: No Abnormality N/A N/A Tenderness on Yes N/A N/A Palpation: Wound Preparation: Ulcer Cleansing: N/A N/A Rinsed/Irrigated with Saline Topical Anesthetic Applied: Other: lidocaine 4% Treatment Notes Electronic Signature(s) Signed: 10/09/2014 9:05:47 AM By: Regan Lemming BSN, RN Entered By: Regan Lemming on 10/09/2014 09:05:47 [name] ([id number]) -------------------------------------------------------------------------------- [place] Details Patient Name: [name] Date of Service: 10/09/2014 8:45 AM Medical Record Number: [id number] Patient Account Number: [id number] Date of Birth/Sex: Jun 27, 1922 (81 y.o. Female) Treating RN: Afful, RN, BSN, Velva Harman Primary Care Physician: Einar Pheasant Other Clinician: Referring Physician: Einar Pheasant Treating Physician/Extender: Frann Rider in Treatment: 4 Active Inactive Abuse / Safety / Falls / Self Care Management Nursing Diagnoses: Potential for falls Goals: Patient will remain injury free Date Initiated: 09/07/2014 Goal Status: Active Interventions: Assess fall risk on admission and as needed Notes: Nutrition Nursing Diagnoses: Potential for alteratiion in Nutrition/Potential for imbalanced nutrition Goals: Patient/caregiver  agrees to and verbalizes understanding of need to use nutritional supplements and/or vitamins as prescribed Date Initiated: 09/07/2014 Goal Status: Active Interventions: Assess patient nutrition upon admission and as needed per policy Notes: Orientation to the Wound Care Program Nursing Diagnoses: Knowledge deficit related to the wound healing center program Goals: Patient/caregiver will verbalize understanding of the [place] ([id number]) Date Initiated: 09/07/2014 Goal Status: Active Interventions: Provide education on orientation to the wound center Notes: Wound/Skin Impairment Nursing Diagnoses: Impaired tissue integrity Goals: Patient/caregiver will verbalize understanding of skin care regimen Date Initiated: 09/07/2014 Goal Status: Active Ulcer/skin breakdown will heal within 14 weeks Date Initiated: 09/07/2014 Goal Status: Active Interventions: Assess patient/caregiver ability to obtain necessary supplies Assess ulceration(s) every visit Notes: Electronic Signature(s) Signed: 10/09/2014 8:58:58 AM By: Regan Lemming BSN, RN Entered By: Regan Lemming on 10/09/2014 08:58:58 [name] ([id number]) -------------------------------------------------------------------------------- Pain Assessment Details Patient Name: [name]. Date of Service: 10/09/2014 8:45 AM Medical Record Number: [id number] Patient Account Number: [id number] Date of Birth/Sex: 1922-12-02 (81 y.o. Female) Treating RN: Afful, RN, BSN, [facility] Primary Care Physician:  Einar Pheasant Other Clinician: Referring Physician: Einar Pheasant Treating Physician/Extender: Frann Rider in Treatment: 4 Active Problems Location of Pain Severity and Description of Pain Patient Has Paino No Site Locations Pain Management and Medication Current Pain Management: Electronic Signature(s) Signed: 10/09/2014 8:51:36 AM By: Regan Lemming BSN, RN Entered By: Regan Lemming on  10/09/2014 08:51:36 [name] ([id number]) -------------------------------------------------------------------------------- Patient/Caregiver Education Details Patient Name: [name]. Date of Service: 10/09/2014 8:45 AM Medical Record Number: [id number] Patient Account Number: [id number] Date of Birth/Gender: Aug 16, 1922 (81 y.o. Female) Treating RN: Baruch Gouty, RN, BSN, Velva Harman Primary Care Physician: Einar Pheasant Other Clinician: Referring Physician: Einar Pheasant Treating Physician/Extender: Frann Rider in Treatment: 4 Education Assessment Education Provided To: Patient and Caregiver Education Topics Provided Welcome To [facility] [place]: Methods: Explain/Verbal Wound/Skin Impairment: Methods: Explain/Verbal Responses: State content correctly Electronic Signature(s) Signed: 10/09/2014 9:43:15 AM By: Regan Lemming BSN, RN Entered By: Regan Lemming on 10/09/2014 09:43:14 [name] ([id number]) -------------------------------------------------------------------------------- Wound Assessment Details Patient Name: [name]. Date of Service: 10/09/2014 8:45 AM Medical Record Number: [id number] Patient Account Number: [id number] Date of Birth/Sex: 03/29/1922 (81 y.o. Female) Treating RN: Afful, RN, BSN, Velva Harman Primary Care Physician: Einar Pheasant Other Clinician: Referring Physician: Einar Pheasant Treating Physician/Extender: Frann Rider in Treatment: 4 Wound Status Wound Number: 1 Primary Venous Leg Ulcer Etiology: Wound Location: Left Lower Leg - Lateral Secondary Cellulitis Wounding Event: Gradually Appeared Etiology: Date Acquired: 08/17/2014 Wound Open Weeks Of Treatment: 4 Status: Clustered Wound: No Comorbid Cataracts, Arrhythmia, Hypertension, History: Peripheral Arterial Disease, Peripheral Venous Disease, Osteoarthritis, Confinement Anxiety Wound Measurements Length: (cm) 7.2 Width: (cm) 3.3 Depth: (cm) 0.2 Area:  (cm) 18.661 Volume: (cm) 3.732 % Reduction in Area: 29.3% % Reduction in Volume: 29.3% Epithelialization: Small (1-33%) Tunneling: No Undermining: No Wound Description Full Thickness Without Exposed Classification: Support Structures Wound Margin: Distinct, outline attached Exudate Medium Amount: Exudate Type: Serosanguineous Exudate Color: red, brown Foul Odor After Cleansing: No Wound Bed Granulation Amount: Medium (34-66%) Exposed Structure Granulation Quality: Red, Pink Fascia Exposed: No Necrotic Amount: Medium (34-66%) Fat Layer Exposed: No Necrotic Quality: Adherent Slough Tendon Exposed: No Muscle Exposed: No Joint Exposed: No Bone Exposed: No Limited to Skin Breakdown Periwound Skin Texture Texture Color Romero, Ninamarie L. ([id number]) No Abnormalities Noted: No No Abnormalities Noted: No Callus: No Atrophie Blanche: No Crepitus: No Cyanosis: No Excoriation: No Ecchymosis: No Fluctuance: No Erythema: No Friable: No Hemosiderin Staining: No Induration: No Mottled: Yes Localized Edema: Yes Pallor: No Rash: No Rubor: No Scarring: No Temperature / Pain Moisture Temperature: No Abnormality No Abnormalities Noted: No Tenderness on Palpation: Yes Dry / Scaly: No Maceration: No Moist: Yes Wound Preparation Ulcer Cleansing: Rinsed/Irrigated with Saline Topical Anesthetic Applied: Other: lidocaine 4%, Treatment Notes Wound #1 (Left, Lateral Lower Leg) 1. Cleansed with: Clean wound with Normal Saline 3. Peri-wound Care: Barrier cream 4. Dressing Applied: Aquacel Ag 5. Secondary Dressing Applied ABD Pad 7. Secured with 2 Layer Lite Compression System - Left Lower Extremity Notes drawtex Electronic Signature(s) Signed: 10/09/2014 8:55:44 AM By: Regan Lemming BSN, RN Entered By: Regan Lemming on 10/09/2014 08:55:44 [name] ([id number]) -------------------------------------------------------------------------------- Vitals  Details Patient Name: [name]. Date of Service: 10/09/2014 8:45 AM Medical Record Number: [id number] Patient Account Number: [id number] Date of Birth/Sex: [birth year] (81 y.o. Female) Treating RN: Baruch Gouty, RN, BSN, Velva Harman Primary Care Physician: Einar Pheasant Other Clinician: Referring Physician: Einar Pheasant Treating Physician/Extender: Frann Rider in Treatment: 4 Vital Signs Time Taken: 08:51 Temperature (F): 97.9  Height (in): 67 Pulse (bpm): 92 Weight (lbs): 159 Respiratory Rate (breaths/min): 18 Body Mass Index (BMI): 24.9 Blood Pressure (mmHg): 137/74 Reference Range: 80 - 120 mg / dl Electronic Signature(s) Signed: 10/09/2014 8:52:00 AM By: Regan Lemming BSN, RN Entered By: Regan Lemming on 10/09/2014 08:52:00

## 2014-10-09 NOTE — Progress Notes (Signed)
Kara, [name] ([id number]) Visit Report for 10/09/2014 Chief Complaint Document Details Patient Name: [name], [name]. Date of Service: 10/09/2014 8:45 AM Medical Record Patient Account Number: [id number] Number: Afful, RN, BSN, Treating RN: 15-May-1922 (81 y.o. [name] Date of Birth/Sex: Female) Other Clinician: Primary Care Physician: Einar Pheasant Treating Christin Fudge Referring Physician: Einar Pheasant Physician/Extender: Suella Grove in Treatment: 4 Information Obtained from: Patient Chief Complaint Patient presents to the wound care center today with an open arterial ulcer. The patient has had left lower extremity ulceration for about 1 month. Electronic Signature(s) Signed: 10/09/2014 9:30:16 AM By: Christin Fudge MD, FACS Entered By: Christin Fudge on 10/09/2014 09:30:16 [name] ([id number]) -------------------------------------------------------------------------------- HPI Details Patient Name: [name] Date of Service: 10/09/2014 8:45 AM Medical Record Patient Account Number: [id number] Number: Afful, RN, BSN, Treating RN: 18-Mar-1922 (81 y.o. [name] Date of Birth/Sex: Female) Other Clinician: Primary Care Physician: Einar Pheasant Treating Christin Fudge Referring Physician: Einar Pheasant Physician/Extender: Suella Grove in Treatment: 4 History of Present Illness Location: left lower extremity ulceration Quality: Patient reports experiencing burning to affected area(s). Severity: Patient states wound are getting worse. Duration: Patient has had the wound for < 4 weeks prior to presenting for treatment Timing: Pain in wound is constant (hurts all the time) Context: The wound appeared gradually over time Modifying Factors: Other treatment(s) tried include:has been on oral anti-buttocks since the beginning of the month and had 10 days of Augmentin and now is on doxycycline. Associated Signs and Symptoms: Patient reports having difficulty  standing for long periods. HPI Description: 81 year old patient was admitted to [facility] between 08/13/2014 and 08/17/2014 with cellulitis of the left lower extremity.duplex study done then showed no DVT and the patient was on IV vancomycin and Rocephin and also received Unasyn later. The patient was sent home on 10 days of Augmentin. Her other comorbidities of atrial fibrillation, essential hypertension, diabetes mellitus type 2, coronary artery disease were managed with appropriate medications. on 09/04/2014 she was seen in the ER and was found to have a 10 cm ulceration on the left lower extremity lateral and superior to the lateral malleolus. The granulation tissue was noted and she was referred to the wound center. the ER notes that her ABIs were within normal limits and she was sent home on consultative management. a duplex scan of the lower extremities was done by the nursing home and this showed that the right ABI was 0.96 with a multiphasic waveform throughout the right leg. There was a monophasic waveform of the left leg suggesting left external iliac artery stenosis with blunting distally and the ABI could not be calculated on the left side. Moderate to severe ischemia was suspected of the left leg and a MR angiogram was recommended. 09/17/2014 -- Xray Left tibia and fibula --- unremarkable tibia and fibula Her wound culture is back and this has grown a heavy growth of staph aureus sensitive to ciprofloxacin and clindamycin and Bactrim. She has been on doxycycline since July 14 for 10 days and we will review her clinically before switching her antibiotics if need be. After reviewing her chart she is not going to be here till 09/17/2014 and hence I will call in Bactrim DS 1 twice a day for 14 days. She has seen Dr. Lucky Cowboy for an opinion and she is scheduled a procedure on this coming Thursday. other than that the pain and swelling continues and she has no other  acute problems. 09/25/2014 -- she had a recent procedure  done by Dr. Lucky Cowboy for diffuse SFA stenosis, popliteal occlusion and tibial disease. she had at angioplasty of the left peroneal artery and tibioperoneal trunk, left superficial Claudio, Mohini L. ([id number]) femoral artery, left popliteal artery, distal SFA and popliteal artery. the patient is experiencing some swelling of the left lower extremity after the procedure. 10/09/2014 -- she has an appointment to see her vascular surgeon again this weekend for possibly a vascular Doppler. No other issues. Electronic Signature(s) Signed: 10/09/2014 9:31:19 AM By: Christin Fudge MD, FACS Entered By: Christin Fudge on 10/09/2014 09:31:18 [name] ([id number]) -------------------------------------------------------------------------------- Physical Exam Details Patient Name: [name]. Date of Service: 10/09/2014 8:45 AM Medical Record Patient Account Number: [id number] Number: Afful, RN, BSN, Treating RN: 05-Sep-1922 (81 y.o. [name] Date of Birth/Sex: Female) Other Clinician: Primary Care Physician: Einar Pheasant Treating Christin Fudge Referring Physician: Einar Pheasant Physician/Extender: Weeks in Treatment: 4 Constitutional . Pulse regular. Respirations normal and unlabored. Afebrile. . Eyes Nonicteric. Reactive to light. Ears, Nose, Mouth, and Throat Lips, teeth, and gums WNL.[name] Moist mucosa without lesions . Neck supple and nontender. No palpable supraclavicular or cervical adenopathy. Normal sized without goiter. Respiratory WNL. No retractions.. Cardiovascular Pedal Pulses WNL. she continues to have edema of her left lower extremity and it is pus 1 pitting. Chest Breasts symmetical and no nipple discharge.. Breast tissue WNL, no masses, lumps, or tenderness.. Lymphatic No adneopathy. No adenopathy. No adenopathy. Musculoskeletal Adexa without tenderness or enlargement.. Digits and nails w/o clubbing,  cyanosis, infection, petechiae, ischemia, or inflammatory conditions.. Integumentary (Hair, Skin) No suspicious lesions. No crepitus or fluctuance. No peri-wound warmth or erythema. No masses.[name] Psychiatric Judgement and insight Intact.. No evidence of depression, anxiety, or agitation.. Notes The wound itself looks cleaner and has less slough and more granulation tissue. Electronic Signature(s) Signed: 10/09/2014 9:32:28 AM By: Christin Fudge MD, FACS Entered By: Christin Fudge on 10/09/2014 09:32:26 [name] ([id number]) -------------------------------------------------------------------------------- Physician Orders Details Patient Name: [name] Date of Service: 10/09/2014 8:45 AM Medical Record Patient Account Number: [id number] Number: Afful, RN, BSN, Treating RN: [birth year] (81 y.o. [name] Date of Birth/Sex: Female) Other Clinician: Primary Care Physician: Einar Pheasant Treating Christin Fudge Referring Physician: Einar Pheasant Physician/Extender: Suella Grove in Treatment: 4 Verbal / Phone Orders: Yes Clinician: Afful, RN, BSN, Rita Read Back and Verified: Yes Diagnosis Coding Wound Cleansing Wound #1 Left,Lateral Lower Leg o May Shower, gently pat wound dry prior to applying new dressing. o May shower with protection. o No tub bath. Skin Barriers/Peri-Wound Care Wound #1 Left,Lateral Lower Leg o Barrier cream Primary Wound Dressing Wound #1 Left,Lateral Lower Leg o Aquacel Ag Secondary Dressing Wound #1 Left,Lateral Lower Leg o ABD pad o Drawtex Dressing Change Frequency Wound #1 Left,Lateral Lower Leg o Change dressing every week Follow-up Appointments Wound #1 Left,Lateral Lower Leg o Return Appointment in 1 week. Edema Control Wound #1 Left,Lateral Lower Leg o 2 Layer Lite Compression System - Left Lower Extremity Electronic Signature(s) MICKEY, HEBEL ([id number]) Signed: 10/09/2014 9:22:09 AM By: Regan Lemming  BSN, RN Signed: 10/09/2014 4:00:25 PM By: Christin Fudge MD, FACS Previous Signature: 10/09/2014 9:08:22 AM Version By: Regan Lemming BSN, RN Entered By: Regan Lemming on 10/09/2014 09:22:08 [name] ([id number]) -------------------------------------------------------------------------------- Problem List Details Patient Name: JEVAEH, SHAMS L. Date of Service: 10/09/2014 8:45 AM Medical Record Patient Account Number: [id number] Number: Afful, RN, BSN, Treating RN: 03-14-1922 (81 y.o. 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 Date of Service: 10/09/2014 8:45 AM Medical Record Patient Account Number: [id number] Number: Afful, RN, BSN, Treating RN: 07-12-1922 (81 y.o. [name] Date of Birth/Sex: Female) Other Clinician: Primary Care Physician: Einar Pheasant Treating Christin Fudge Referring Physician: Einar Pheasant Physician/Extender: Suella Grove in Treatment: 4 Subjective Chief Complaint Information obtained from Patient Patient presents to the wound care center today with an open arterial ulcer. The patient has had left lower extremity ulceration for about 1  month. History of Present Illness (HPI) The following HPI elements were documented for the patient's wound: Location: left lower extremity ulceration Quality: Patient reports experiencing burning to affected area(s). Severity: Patient states wound are getting worse. Duration: Patient has had the wound for < 4 weeks prior to presenting for treatment Timing: Pain in wound is constant (hurts all the time) Context: The wound appeared gradually over time Modifying Factors: Other treatment(s) tried include:has been on oral anti-buttocks since the beginning of the month and had 10 days of Augmentin and now is on doxycycline. Associated Signs and Symptoms: Patient reports having difficulty standing for long periods. 81 year old patient was admitted to [facility] between 08/13/2014 and 08/17/2014 with cellulitis of the left lower extremity.duplex study done then showed no DVT and the patient was on IV vancomycin and Rocephin and also received Unasyn later. The patient was sent home on 10 days of Augmentin. Her other comorbidities of atrial fibrillation, essential hypertension, diabetes mellitus type 2, coronary artery disease were managed with appropriate medications. on 09/04/2014 she was seen in the ER and was found to have a 10 cm ulceration on the left lower extremity lateral and superior to the lateral malleolus. The granulation tissue was noted and she was referred to the wound center. the ER notes that her ABIs were within normal limits and she was sent home on consultative management. a duplex scan of the lower extremities was done by the nursing home and this showed that the right ABI was 0.96 with a multiphasic waveform throughout the right leg. There was a monophasic waveform of the left leg suggesting left external iliac artery stenosis with blunting distally and the ABI could not be calculated on the left side. Moderate to severe ischemia was suspected of the left  leg and a MR angiogram was recommended. 09/17/2014 -- Xray Left tibia and fibula --- unremarkable tibia and fibula Her wound culture is back and this has grown a heavy growth of staph aureus sensitive to ciprofloxacin and clindamycin and Bactrim. MARRIETTA, THUNDER ([id number]) She has been on doxycycline since July 14 for 10 days and we will review her clinically before switching her antibiotics if need be. After reviewing her chart she is not going to be here till 09/17/2014 and hence I will call in Bactrim DS 1 twice a day for 14 days. She has seen Dr. Lucky Cowboy for an opinion and she is scheduled a procedure on this coming Thursday. other than that the pain and swelling continues and she  has no other acute problems. 09/25/2014 -- she had a recent procedure done by Dr. Lucky Cowboy for diffuse SFA stenosis, popliteal occlusion and tibial disease. she had at angioplasty of the left peroneal artery and tibioperoneal trunk, left superficial femoral artery, left popliteal artery, distal SFA and popliteal artery. the patient is experiencing some swelling of the left lower extremity after the procedure. 10/09/2014 -- she has an appointment to see her vascular surgeon again this weekend for possibly a vascular Doppler. No other issues. Objective Constitutional Pulse regular. Respirations normal and unlabored. Afebrile. Vitals Time Taken: 8:51 AM, Height: 67 in, Weight: 159 lbs, BMI: 24.9, Temperature: 97.9 F, Pulse: 92 bpm, Respiratory Rate: 18 breaths/min, Blood Pressure: 137/74 mmHg. Eyes Nonicteric. Reactive to light. Ears, Nose, Mouth, and Throat Lips, teeth, and gums WNL.[name] Moist mucosa without lesions . Neck supple and nontender. No palpable supraclavicular or cervical adenopathy. Normal sized without goiter. Respiratory WNL. No retractions.. Cardiovascular Pedal Pulses WNL. she continues to have edema of her left lower extremity and it is pus 1 pitting. Chest Breasts symmetical and no nipple  discharge.. Breast tissue WNL, no masses, lumps, or tenderness.. Lymphatic No adneopathy. No adenopathy. No adenopathy. Musculoskeletal DEAVEN, BARRON. ([id number]) Adexa without tenderness or enlargement.. Digits and nails w/o clubbing, cyanosis, infection, petechiae, ischemia, or inflammatory conditions.[name] Psychiatric Judgement and insight Intact.. No evidence of depression, anxiety, or agitation.. General Notes: The wound itself looks cleaner and has less slough and more granulation tissue. Integumentary (Hair, Skin) No suspicious lesions. No crepitus or fluctuance. No peri-wound warmth or erythema. No masses.. Wound #1 status is Open. Original cause of wound was Gradually Appeared. The wound is located on the Left,Lateral Lower Leg. The wound measures 7.2cm length x 3.3cm width x 0.2cm depth; 18.661cm^2 area and 3.732cm^3 volume. The wound is limited to skin breakdown. There is no tunneling or undermining noted. There is a medium amount of serosanguineous drainage noted. The wound margin is distinct with the outline attached to the wound base. There is medium (34-66%) red, pink granulation within the wound bed. There is a medium (34-66%) amount of necrotic tissue within the wound bed including Adherent Slough. The periwound skin appearance exhibited: Localized Edema, Moist, Mottled. The periwound skin appearance did not exhibit: Callus, Crepitus, Excoriation, Fluctuance, Friable, Induration, Rash, Scarring, Dry/Scaly, Maceration, Atrophie Blanche, Cyanosis, Ecchymosis, Hemosiderin Staining, Pallor, Rubor, Erythema. Periwound temperature was noted as No Abnormality. The periwound has tenderness on palpation. Assessment Active Problems ICD-10 I70.248 - Atherosclerosis of native arteries of left leg with ulceration of other part of lower left leg L97.222 - Non-pressure chronic ulcer of left calf with fat layer exposed L03.116 - Cellulitis of left lower limb I will change her over to  silver alginate and a light 2 layer compression wrap to help with her edema. After her vascular workup if we have good vascular flow we may be able to increase her compression. Elevation and dressing changes discussed in detail with her caregiver. Plan Wound Cleansing: EMALEY, APPLIN ([id number]) Wound #1 Left,Lateral Lower Leg: May Shower, gently pat wound dry prior to applying new dressing. May shower with protection. No tub bath. Skin Barriers/Peri-Wound Care: Wound #1 Left,Lateral Lower Leg: Barrier cream Primary Wound Dressing: Wound #1 Left,Lateral Lower Leg: Aquacel Ag Secondary Dressing: Wound #1 Left,Lateral Lower Leg: ABD pad Drawtex Dressing Change Frequency: Wound #1 Left,Lateral Lower Leg: Change dressing every week Follow-up Appointments: Wound #1 Left,Lateral Lower Leg: Return Appointment in 1 week. Edema Control: Wound #1 Left,Lateral Lower Leg: 2 Layer Lite  Compression System - Left Lower Extremity I will change her over to silver alginate and a light 2 layer compression wrap to help with her edema. After her vascular workup if we have good vascular flow we may be able to increase her compression. Elevation and dressing changes discussed in detail with her caregiver. Electronic Signature(s) Signed: 10/09/2014 9:33:55 AM By: Christin Fudge MD, FACS Entered By: Christin Fudge on 10/09/2014 09:33:54 [name] ([id number]) -------------------------------------------------------------------------------- SuperBill Details Patient Name: [name] Date of Service: 10/09/2014 Medical Record Patient Account Number: [id number] Number: Afful, RN, BSN, Treating RN: 04-16-1922 [phone] y.o. [name] Date of Birth/Sex: Female) Other Clinician: Primary Care Physician: Einar Pheasant Treating Lessie Manigo Referring Physician: Einar Pheasant Physician/Extender: Suella Grove in Treatment: 4 Diagnosis Coding ICD-10 Codes Code Description I70.248  Atherosclerosis of native arteries of left leg with ulceration of other part of lower left leg L97.222 Non-pressure chronic ulcer of left calf with fat layer exposed L03.116 Cellulitis of left lower limb Physician Procedures CPT4: Description Modifier Quantity Code [id number] - WC PHYS LEVEL 3 - EST PT 1 ICD-10 Description Diagnosis I70.248 Atherosclerosis of native arteries of left leg with ulceration of other part of lower left leg L97.222 Non-pressure chronic ulcer of  left calf with fat layer exposed L03.116 Cellulitis of left lower limb Electronic Signature(s) Signed: 10/09/2014 9:34:55 AM By: Christin Fudge MD, FACS Entered By: Christin Fudge on 10/09/2014 09:34:55

## 2014-10-16 ENCOUNTER — Encounter: Payer: Self-pay | Admitting: General Surgery

## 2014-10-16 ENCOUNTER — Encounter (HOSPITAL_BASED_OUTPATIENT_CLINIC_OR_DEPARTMENT_OTHER): Payer: Medicare Other | Admitting: General Surgery

## 2014-10-16 DIAGNOSIS — I639 Cerebral infarction, unspecified: Secondary | ICD-10-CM | POA: Diagnosis not present

## 2014-10-16 DIAGNOSIS — I83022 Varicose veins of left lower extremity with ulcer of calf: Secondary | ICD-10-CM

## 2014-10-16 DIAGNOSIS — L97222 Non-pressure chronic ulcer of left calf with fat layer exposed: Secondary | ICD-10-CM | POA: Diagnosis not present

## 2014-10-16 DIAGNOSIS — L97229 Non-pressure chronic ulcer of left calf with unspecified severity: Principal | ICD-10-CM

## 2014-10-16 NOTE — Progress Notes (Signed)
See i heal 

## 2014-10-16 NOTE — Progress Notes (Addendum)
TYA, HAUGHEY ([id number]) Visit Report for 10/16/2014 Arrival Information Details Patient Name: [name], [name]. Date of Service: 10/16/2014 10:00 AM Medical Record Number: [id number] Patient Account Number: [id number] Date of Birth/Sex: October 27, 1922 (81 y.o. Female) Treating RN: Afful, RN, BSN, Velva Harman Primary Care Physician: Einar Pheasant Other Clinician: Referring Physician: Einar Pheasant Treating Physician/Extender: Benjaman Pott in Treatment: 5 Visit Information History Since Last Visit Any new allergies or adverse reactions: No Patient Arrived: Wheel Chair Had a fall or experienced change in No Arrival Time: 09:29 activities of daily [name] that may affect Accompanied By: caregiver risk of falls: Transfer Assistance: None Signs or symptoms of abuse/neglect since last No Patient Identification Verified: Yes visito Secondary Verification Process Yes Hospitalized since last visit: No Completed: Has Dressing in Place as Prescribed: Yes Patient Requires Transmission- No Pain Present Now: No Based Precautions: Patient Has Alerts: Yes Patient Alerts: Patient on Blood Thinner Eliquis. R ABI:0.96 Electronic Signature(s) Signed: 10/16/2014 11:48:43 AM By: Judene Companion MD Previous Signature: 10/16/2014 9:29:49 AM Version By: Regan Lemming BSN, RN Entered By: Judene Companion on 10/16/2014 11:48:43 [name] ([id number]) -------------------------------------------------------------------------------- Encounter Discharge Information Details Patient Name: [name]. Date of Service: 10/16/2014 10:00 AM Medical Record Number: [id number] Patient Account Number: [id number] Date of Birth/Sex: Dec 08, 1922 (81 y.o. Female) Treating RN: Afful, RN, BSN, Velva Harman Primary Care Physician: Einar Pheasant Other Clinician: Referring Physician: Einar Pheasant Treating Physician/Extender: Benjaman Pott in Treatment: 5 Encounter Discharge Information Items Discharge Pain  Level: 0 Discharge Condition: Stable Ambulatory Status: Wheelchair Discharge Destination: Nursing Home Transportation: Other Accompanied By: caregiver Schedule Follow-up Appointment: No Medication Reconciliation completed and provided to Patient/Care No [name]: Provided on Clinical Summary of Care: 10/16/2014 Form Type Recipient Paper Patient LS Electronic Signature(s) Signed: 10/16/2014 11:53:18 AM By: Judene Companion MD Previous Signature: 10/16/2014 10:04:04 AM Version By: Ruthine Dose Previous Signature: 10/16/2014 10:02:58 AM Version By: Regan Lemming BSN, RN Entered By: Judene Companion on 10/16/2014 11:53:18 [name] ([id number]) -------------------------------------------------------------------------------- Lower Extremity Assessment Details Patient Name: [name]. Date of Service: 10/16/2014 10:00 AM Medical Record Number: [id number] Patient Account Number: [id number] Date of Birth/Sex: [birth year] (81 y.o. Female) Treating RN: Afful, RN, BSN, Velva Harman Primary Care Physician: Einar Pheasant Other Clinician: Referring Physician: Einar Pheasant Treating Physician/Extender: Benjaman Pott in Treatment: 5 Edema Assessment Assessed: [Left: No] [Right: No] E[Left: dema] [Right: :] Calf Left: Right: Point of Measurement: 37 cm From Medial Instep 29.6 cm cm Ankle Left: Right: Point of Measurement: 12 cm From Medial Instep 21 cm cm Vascular Assessment Claudication: Claudication Assessment [Left:None] Pulses: Posterior Tibial Dorsalis Pedis Palpable: [Left:Yes] Extremity colors, hair growth, and conditions: Extremity Color: [Left:Red] Hair Growth on Extremity: [Left:No] Temperature of Extremity: [Left:Warm] Capillary Refill: [Left:< 3 seconds] Toe Nail Assessment Left: Right: Thick: Yes Discolored: Yes Deformed: Yes Improper Length and Hygiene: Yes Electronic Signature(s) Signed: 10/16/2014 11:49:22 AM By: Judene Companion MD Signed: 10/16/2014 11:52:47  AM By: Regan Lemming BSN, RN Previous Signature: 10/16/2014 9:39:14 AM Version By: Regan Lemming BSN, RN [name], [name] ([id number]) Entered By: Judene Companion on 10/16/2014 11:49:22 [name] ([id number]) -------------------------------------------------------------------------------- Multi Wound Chart Details Patient Name: Kara Campion L. Date of Service: 10/16/2014 10:00 AM Medical Record Number: [id number] Patient Account Number: [id number] Date of Birth/Sex: [birth year] (81 y.o. Female) Treating RN: Cornell Barman Primary Care Physician: Einar Pheasant Other Clinician: Referring Physician: Einar Pheasant Treating Physician/Extender: Benjaman Pott in Treatment: 5 Vital Signs Height(in): 67 Pulse(bpm): 89 Weight(lbs): 159 Blood Pressure 140/75 (mmHg): Body Mass Index(BMI):  25 Temperature(F): 97.8 Respiratory Rate 18 (breaths/min): Photos: [1:No Photos] [N/A:N/A] Wound Location: [1:Left Lower Leg - Lateral] [N/A:N/A] Wounding Event: [1:Gradually Appeared] [N/A:N/A] Primary Etiology: [1:Venous Leg Ulcer] [N/A:N/A] Secondary Etiology: [1:Cellulitis] [N/A:N/A] Comorbid History: [1:Cataracts, Arrhythmia, Hypertension, Peripheral Arterial Disease, Peripheral Venous Disease, Osteoarthritis, Confinement Anxiety] [N/A:N/A] Date Acquired: [1:08/17/2014] [N/A:N/A] Weeks of Treatment: [1:5] [N/A:N/A] Wound Status: [1:Open] [N/A:N/A] Measurements L x W x D 6.9x2.5x0.2 [N/A:N/A] (cm) Area (cm) : [1:13.548] [N/A:N/A] Volume (cm) : [1:2.71] [N/A:N/A] % Reduction in Area: [1:48.70%] [N/A:N/A] % Reduction in Volume: 48.70% [N/A:N/A] Classification: [1:Full Thickness Without Exposed Support Structures] [N/A:N/A] Exudate Amount: [1:Medium] [N/A:N/A] Exudate Type: [1:Serosanguineous] [N/A:N/A] Exudate Color: [1:red, brown] [N/A:N/A] Wound Margin: [1:Distinct, outline attached] [N/A:N/A] Granulation Amount: [1:Medium (34-66%)] [N/A:N/A] Granulation Quality: [1:Red, Pink]  [N/A:N/A] Necrotic Amount: [1:Medium (34-66%)] [N/A:N/A] Exposed Structures: Fascia: No N/A N/A Fat: No Tendon: No Muscle: No Joint: No Bone: No Limited to Skin Breakdown Epithelialization: Medium (34-66%) N/A N/A Periwound Skin Texture: Edema: Yes N/A N/A Excoriation: No Induration: No Callus: No Crepitus: No Fluctuance: No Friable: No Rash: No Scarring: No Periwound Skin Moist: Yes N/A N/A Moisture: Maceration: No Dry/Scaly: No Periwound Skin Color: Mottled: Yes N/A N/A Atrophie Blanche: No Cyanosis: No Ecchymosis: No Erythema: No Hemosiderin Staining: No Pallor: No Rubor: No Temperature: No Abnormality N/A N/A Tenderness on Yes N/A N/A Palpation: Wound Preparation: Ulcer Cleansing: N/A N/A Rinsed/Irrigated with Saline Topical Anesthetic Applied: Other: lidocaine 4% Treatment Notes Electronic Signature(s) Signed: 10/16/2014 12:42:01 PM By: Gretta Cool, RN, BSN, Kim RN, BSN Entered By: Gretta Cool, RN, BSN, Kim on 10/16/2014 09:50:14 [name] ([id number]) -------------------------------------------------------------------------------- Milo Details Patient Name: [name] Date of Service: 10/16/2014 10:00 AM Medical Record Number: [id number] Patient Account Number: [id number] Date of Birth/Sex: 13-Sep-1922 (81 y.o. Female) Treating RN: Cornell Barman Primary Care Physician: Einar Pheasant Other Clinician: Referring Physician: Einar Pheasant Treating Physician/Extender: Benjaman Pott in Treatment: 5 Active Inactive Abuse / Safety / Falls / Self Care Management Nursing Diagnoses: Potential for falls Goals: Patient will remain injury free Date Initiated: 09/07/2014 Goal Status: Active Interventions: Assess fall risk on admission and as needed Notes: Nutrition Nursing Diagnoses: Potential for alteratiion in Nutrition/Potential for imbalanced nutrition Goals: Patient/caregiver agrees to and verbalizes understanding of  need to use nutritional supplements and/or vitamins as prescribed Date Initiated: 09/07/2014 Goal Status: Active Interventions: Assess patient nutrition upon admission and as needed per policy Notes: Orientation to the Wound Care Program Nursing Diagnoses: Knowledge deficit related to the wound healing center program Goals: Patient/caregiver will verbalize understanding of the [place], [place] ([id number]) Date Initiated: 09/07/2014 Goal Status: Active Interventions: Provide education on orientation to the wound center Notes: Wound/Skin Impairment Nursing Diagnoses: Impaired tissue integrity Goals: Patient/caregiver will verbalize understanding of skin care regimen Date Initiated: 09/07/2014 Goal Status: Active Ulcer/skin breakdown will heal within 14 weeks Date Initiated: 09/07/2014 Goal Status: Active Interventions: Assess patient/caregiver ability to obtain necessary supplies Assess ulceration(s) every visit Notes: Electronic Signature(s) Signed: 10/16/2014 12:42:01 PM By: Gretta Cool, RN, BSN, Kim RN, BSN Entered By: Gretta Cool, RN, BSN, Kim on 10/16/2014 09:50:06 [name] ([id number]) -------------------------------------------------------------------------------- Pain Assessment Details Patient Name: [name]. Date of Service: 10/16/2014 10:00 AM Medical Record Number: [id number] Patient Account Number: [id number] Date of Birth/Sex: Mar 04, 1922 (81 y.o. Female) Treating RN: Baruch Gouty, RN, BSN, Velva Harman Primary Care Physician: Einar Pheasant Other Clinician: Referring Physician: Einar Pheasant Treating Physician/Extender: Benjaman Pott in Treatment: 5 Active Problems Location of Pain Severity and Description of Pain Patient Has  Paino No Site Locations Pain Management and Medication Current Pain Management: Electronic Signature(s) Signed: 10/16/2014 11:48:55 AM By: Judene Companion MD Signed: 10/16/2014 11:52:47 AM By: Regan Lemming BSN,  RN Previous Signature: 10/16/2014 9:29:55 AM Version By: Regan Lemming BSN, RN Entered By: Judene Companion on 10/16/2014 11:48:55 [name] ([id number]) -------------------------------------------------------------------------------- Patient/Caregiver Education Details Patient Name: [name] Date of Service: 10/16/2014 10:00 AM Medical Record Number: [id number] Patient Account Number: [id number] Date of Birth/Gender: 05/09/1922 (81 y.o. Female) Treating RN: Baruch Gouty, RN, BSN, Velva Harman Primary Care Physician: Einar Pheasant Other Clinician: Referring Physician: Einar Pheasant Treating Physician/Extender: Benjaman Pott in Treatment: 5 Education Assessment Education Provided To: Patient Education Topics Provided Basic Hygiene: Methods: Explain/Verbal Responses: State content correctly Welcome To [facility]: Methods: Explain/Verbal Responses: State content correctly Electronic Signature(s) Signed: 10/16/2014 11:53:32 AM By: Judene Companion MD Previous Signature: 10/16/2014 10:03:15 AM Version By: Regan Lemming BSN, RN Entered By: Judene Companion on 10/16/2014 11:53:31 [name] ([id number]) -------------------------------------------------------------------------------- Wound Assessment Details Patient Name: [name]. Date of Service: 10/16/2014 10:00 AM Medical Record Number: [id number] Patient Account Number: [id number] Date of Birth/Sex: 1923/01/14 (81 y.o. Female) Treating RN: Afful, RN, BSN, Velva Harman Primary Care Physician: Einar Pheasant Other Clinician: Referring Physician: Einar Pheasant Treating Physician/Extender: Benjaman Pott in Treatment: 5 Wound Status Wound Number: 1 Primary Venous Leg Ulcer Etiology: Wound Location: Left Lower Leg - Lateral Secondary Cellulitis Wounding Event: Gradually Appeared Etiology: Date Acquired: 08/17/2014 Wound Open Weeks Of Treatment: 5 Status: Clustered Wound: No Comorbid Cataracts, Arrhythmia,  Hypertension, History: Peripheral Arterial Disease, Peripheral Venous Disease, Osteoarthritis, Confinement Anxiety Photos Wound Measurements Length: (cm) 6.9 Width: (cm) 2.5 Depth: (cm) 0.2 Area: (cm) 13.548 Volume: (cm) 2.71 % Reduction in Area: 48.7% % Reduction in Volume: 48.7% Epithelialization: Medium (34-66%) Tunneling: No Undermining: No Wound Description Full Thickness Without Exposed Classification: Support Structures Wound Margin: Distinct, outline attached Exudate Medium Amount: Exudate Type: Serosanguineous Exudate Color: red, brown Foul Odor After Cleansing: No Wound Bed Zwicker, Kara L. ([id number]) Granulation Amount: Medium (34-66%) Exposed Structure Granulation Quality: Red, Pink Fascia Exposed: No Necrotic Amount: Medium (34-66%) Fat Layer Exposed: No Necrotic Quality: Adherent Slough Tendon Exposed: No Muscle Exposed: No Joint Exposed: No Bone Exposed: No Limited to Skin Breakdown Periwound Skin Texture Texture Color No Abnormalities Noted: No No Abnormalities Noted: No Callus: No Atrophie Blanche: No Crepitus: No Cyanosis: No Excoriation: No Ecchymosis: No Fluctuance: No Erythema: No Friable: No Hemosiderin Staining: No Induration: No Mottled: Yes Localized Edema: Yes Pallor: No Rash: No Rubor: No Scarring: No Temperature / Pain Moisture Temperature: No Abnormality No Abnormalities Noted: No Tenderness on Palpation: Yes Dry / Scaly: No Maceration: No Moist: Yes Wound Preparation Ulcer Cleansing: Rinsed/Irrigated with Saline Topical Anesthetic Applied: Other: lidocaine 4%, Treatment Notes Wound #1 (Left, Lateral Lower Leg) 1. Cleansed with: Cleanse wound with antibacterial soap and water 2. Anesthetic Topical Lidocaine 4% cream to wound bed prior to debridement 3. Peri-wound Care: Barrier cream Moisturizing lotion 4. Dressing Applied: Aquacel Ag 5. Secondary Dressing Applied ABD Pad Dry Gauze 7. Secured  with [name], [name] ([id number]) 2 Layer Lite Compression System - Left Lower Extremity Electronic Signature(s) Signed: 10/17/2014 3:55:59 PM By: Regan Lemming BSN, RN Previous Signature: 10/16/2014 9:43:04 AM Version By: Regan Lemming BSN, RN Entered By: Regan Lemming on 10/17/2014 15:55:59 [name] ([id number]) -------------------------------------------------------------------------------- Vitals Details Patient Name: [name] Date of Service: 10/16/2014 10:00 AM Medical Record Number: [id number] Patient Account Number: [id number] Date of Birth/Sex: 10-22-1922 (81  y.o. Female) Treating RN: Afful, RN, BSN, Velva Harman Primary Care Physician: Einar Pheasant Other Clinician: Referring Physician: Einar Pheasant Treating Physician/Extender: Benjaman Pott in Treatment: 5 Vital Signs Time Taken: 09:37 Temperature (F): 97.8 Height (in): 67 Pulse (bpm): 89 Weight (lbs): 159 Respiratory Rate (breaths/min): 18 Body Mass Index (BMI): 24.9 Blood Pressure (mmHg): 140/75 Reference Range: 80 - 120 mg / dl Electronic Signature(s) Signed: 10/16/2014 11:49:07 AM By: Judene Companion MD Previous Signature: 10/16/2014 9:37:58 AM Version By: Regan Lemming BSN, RN Entered By: Judene Companion on 10/16/2014 11:49:07

## 2014-10-16 NOTE — Progress Notes (Signed)
[name] ([id number]) Visit Report for 10/16/2014 Chief Complaint Document Details Patient Name: Kara, [name] 10/16/2014 10:00 Date of Service: AM Medical Record [id number] Number: Patient Account Number: [id number] [birth year] (81 y.o. Treating RN: [name] Date of Birth/Sex: Female) Other Clinician: Primary Care Physician: [name], [name] Referring Physician: Einar [name] Physician/Extender: [name] in Treatment: 5 Information Obtained from: Patient Chief Complaint Patient presents to the wound care center today with an open arterial ulcer. The patient has had left lower extremity ulceration for about 1 month. Electronic Signature(s) Signed: 10/16/2014 11:50:11 AM By: Kara Companion MD Entered By: [name] on 10/16/2014 11:50:11 [name] ([id number]) -------------------------------------------------------------------------------- Debridement Details Patient Name: [name] 10/16/2014 10:00 Date of Service: AM Medical Record [id number] Number: Patient Account Number: [id number] June 06, 1922 (81 y.o. Treating RN: [name] Date of Birth/Sex: Female) Other Clinician: Primary Care Physician: [name], [name] Referring Physician: Einar [name] Physician/Extender: [name] in Treatment: 5 Debridement Performed for Wound #1 Left,Lateral Lower Leg Assessment: Performed By: Physician Kara Companion, MD Debridement: Debridement Pre-procedure Yes Verification/Time Out Taken: Start Time: 09:51 Pain Control: Other : lidocaine 4% Level: Skin/Subcutaneous Tissue Total Area Debrided (L x 6.9 (cm) x 2.5 (cm) = 17.25 (cm) W): Tissue and other Viable, Non-Viable, Exudate, Fibrin/Slough, Skin, Subcutaneous material debrided: Instrument: Curette Bleeding: Large Hemostasis Achieved: Pressure End Time: 09:55 Procedural Pain: 1 Post Procedural Pain: 2 Response to Treatment: Procedure was tolerated  well Post Debridement Measurements of Total Wound Length: (cm) 6.9 Width: (cm) 2.5 Depth: (cm) 0.3 Volume: (cm) 4.064 Post Procedure Diagnosis Same as Pre-procedure Electronic Signature(s) Signed: 10/16/2014 12:42:01 PM By: Kara Cool, RN, BSN, Kim RN, BSN Entered By: [name] on 10/16/2014 09:53:15 [name] ([id number]) -------------------------------------------------------------------------------- HPI Details Patient Name: Kara, [name] 10/16/2014 10:00 Date of Service: AM Medical Record [id number] Number: Patient Account Number: [id number] 1922-03-15 (81 y.o. Treating RN: [name] Date of Birth/Sex: Female) Other Clinician: Primary Care Physician: [name], Keaton Beichner Referring Physician: Einar [name] Physician/Extender: [name] in Treatment: 5 History of Present Illness Location: left lower extremity ulceration Quality: Patient reports experiencing burning to affected area(s). Severity: Patient states wound are getting worse. Duration: Patient has had the wound for < 4 [name] prior to presenting for treatment Timing: Pain in wound is constant (hurts all the time) Context: The wound appeared gradually over time Modifying Factors: Other treatment(s) tried include:has been on oral anti-buttocks since the beginning of the month and had 10 days of Augmentin and now is on doxycycline. Associated Signs and Symptoms: Patient reports having difficulty standing for long periods. HPI Description: 81 year old patient was admitted to [facility] between 08/13/2014 and 08/17/2014 with cellulitis of the left lower extremity.duplex study done then showed no DVT and the patient was on IV vancomycin and Rocephin and also received Unasyn later. The patient was sent home on 10 days of Augmentin. Her other comorbidities of atrial fibrillation, essential hypertension, diabetes mellitus type 2, coronary artery disease were  managed with appropriate medications. on 09/04/2014 she was seen in the ER and was found to have a 10 cm ulceration on the left lower extremity lateral and superior to the lateral malleolus. The granulation tissue was noted and she was referred to the wound center. the ER notes that her ABIs were within normal limits and she was sent home on consultative management. a duplex scan of the lower extremities was done by the nursing home and this showed that  the right ABI was 0.96 with a multiphasic waveform throughout the right leg. There was a monophasic waveform of the left leg suggesting left external iliac artery stenosis with blunting distally and the ABI could not be calculated on the left side. Moderate to severe ischemia was suspected of the left leg and a MR angiogram was recommended. 09/17/2014 -- Xray Left tibia and fibula --- unremarkable tibia and fibula Her wound culture is back and this has grown a heavy growth of staph aureus sensitive to ciprofloxacin and clindamycin and Bactrim. She has been on doxycycline since July 14 for 10 days and we will review her clinically before switching her antibiotics if need be. After reviewing her chart she is not going to be here till 09/17/2014 and hence I will call in Bactrim DS 1 twice a day for 14 days. She has seen Dr. Lucky [name] for an opinion and she is scheduled a procedure on this coming Thursday. other than that the pain and swelling continues and she has no other acute problems. 09/25/2014 -- she had a recent procedure done by Dr. Lucky [name] for diffuse SFA stenosis, popliteal occlusion and tibial disease. she had at angioplasty of the left peroneal artery and tibioperoneal trunk, left superficial [name], Kara L. ([id number]) femoral artery, left popliteal artery, distal SFA and popliteal artery. the patient is experiencing some swelling of the left lower extremity after the procedure. 10/09/2014 -- she has an appointment to see her vascular surgeon  again this weekend for possibly a vascular Doppler. No other issues. Electronic Signature(s) Signed: 10/16/2014 11:50:37 AM By: Kara Companion MD Entered By: [name] on 10/16/2014 11:50:37 [name] ([id number]) -------------------------------------------------------------------------------- Physical Exam Details Patient Name: Kara, [name] 10/16/2014 10:00 Date of Service: AM Medical Record [id number] Number: Patient Account Number: [id number] [birth year] (81 y.o. Treating RN: [name] Date of Birth/Sex: Female) Other Clinician: Primary Care Physician: [name], Leeman Johnsey Referring Physician: Einar [name] Physician/Extender: [name] in Treatment: 5 Electronic Signature(s) Signed: 10/16/2014 11:50:49 AM By: Kara Companion MD Entered By: [name] on 10/16/2014 11:50:48 [name] ([id number]) -------------------------------------------------------------------------------- Physician Orders Details Patient Name: Kara, [name] 10/16/2014 10:00 Date of Service: AM Medical Record [id number] Number: Patient Account Number: [id number] [birth year] (81 y.o. Treating RN: [name] Date of Birth/Sex: Female) Other Clinician: Primary Care Physician: [name], Johsua Shevlin Referring Physician: Einar [name] Physician/Extender: [name] in Treatment: 5 Verbal / Phone Orders: Yes Clinician: Cornell [name] Read Back and Verified: Yes Diagnosis Coding Wound Cleansing Wound #1 Left,Lateral Lower Leg o May Shower, gently pat wound dry prior to applying new dressing. o May shower with protection. o No tub bath. Skin Barriers/Peri-Wound Care Wound #1 Left,Lateral Lower Leg o Barrier cream Primary Wound Dressing Wound #1 Left,Lateral Lower Leg o Aquacel Ag Secondary Dressing Wound #1 Left,Lateral Lower Leg o ABD pad o Drawtex Dressing Change Frequency Wound #1 Left,Lateral Lower Leg o Change  dressing every week Follow-up Appointments Wound #1 Left,Lateral Lower Leg o Return Appointment in 1 week. Edema Control Wound #1 Left,Lateral Lower Leg o 2 Layer Lite Compression System - Left Lower Extremity Electronic Signature(s) Kara, [name] ([id number]) Signed: 10/16/2014 12:42:01 PM By: Kara Cool, RN, BSN, Kim RN, BSN Entered By: [name] on 10/16/2014 09:53:57 [name] ([id number]) -------------------------------------------------------------------------------- Problem List Details Patient Name: Kara, [name] 10/16/2014 10:00 Date of Service: AM Medical Record [id number] Number: Patient Account Number: [id number] 05-06-1922 (81 y.o. Treating RN: [name] Date of Birth/Sex: Female) Other  Clinician: Primary Care Physician: [name], Ivin Rosenbloom Referring Physician: Einar [name] Physician/Extender: [name] in Treatment: 5 Active Problems ICD-10 Encounter Code Description Active Date Diagnosis I70.248 Atherosclerosis of native arteries of left leg with ulceration 09/07/2014 Yes of other part of lower left leg L97.222 Non-pressure chronic ulcer of left calf with fat layer 09/07/2014 Yes exposed L03.116 Cellulitis of left lower limb 09/07/2014 Yes Inactive Problems Resolved Problems Electronic Signature(s) Signed: 10/16/2014 11:49:54 AM By: Kara Companion MD Entered By: [name] on 10/16/2014 11:49:54 [name] ([id number]) -------------------------------------------------------------------------------- Progress Note Details Patient Name: Kara, [name] 10/16/2014 10:00 Date of Service: AM Medical Record [id number] Number: Patient Account Number: [id number] Apr 16, 1922 (81 y.o. Treating RN: [name] Date of Birth/Sex: Female) Other Clinician: Primary Care Physician: [name], Jett Kulzer Referring Physician: Einar [name] Physician/Extender: [name] in Treatment:  5 Subjective Chief Complaint Information obtained from Patient Patient presents to the wound care center today with an open arterial ulcer. The patient has had left lower extremity ulceration for about 1 month. History of Present Illness (HPI) The following HPI elements were documented for the patient's wound: Location: left lower extremity ulceration Quality: Patient reports experiencing burning to affected area(s). Severity: Patient states wound are getting worse. Duration: Patient has had the wound for < 4 [name] prior to presenting for treatment Timing: Pain in wound is constant (hurts all the time) Context: The wound appeared gradually over time Modifying Factors: Other treatment(s) tried include:has been on oral anti-buttocks since the beginning of the month and had 10 days of Augmentin and now is on doxycycline. Associated Signs and Symptoms: Patient reports having difficulty standing for long periods. 81 year old patient was admitted to [facility] between 08/13/2014 and 08/17/2014 with cellulitis of the left lower extremity.duplex study done then showed no DVT and the patient was on IV vancomycin and Rocephin and also received Unasyn later. The patient was sent home on 10 days of Augmentin. Her other comorbidities of atrial fibrillation, essential hypertension, diabetes mellitus type 2, coronary artery disease were managed with appropriate medications. on 09/04/2014 she was seen in the ER and was found to have a 10 cm ulceration on the left lower extremity lateral and superior to the lateral malleolus. The granulation tissue was noted and she was referred to the wound center. the ER notes that her ABIs were within normal limits and she was sent home on consultative management. a duplex scan of the lower extremities was done by the nursing home and this showed that the right ABI was 0.96 with a multiphasic waveform throughout the right leg. There was a  monophasic waveform of the left leg suggesting left external iliac artery stenosis with blunting distally and the ABI could not be calculated on the left side. Moderate to severe ischemia was suspected of the left leg and a MR angiogram was recommended. 09/17/2014 -- Xray Left tibia and fibula --- unremarkable tibia and fibula Her wound culture is back and this has grown a heavy growth of staph aureus sensitive to ciprofloxacin and clindamycin and Bactrim. Kara, [name] ([id number]) She has been on doxycycline since July 14 for 10 days and we will review her clinically before switching her antibiotics if need be. After reviewing her chart she is not going to be here till 09/17/2014 and hence I will call in Bactrim DS 1 twice a day for 14 days. She has seen Dr. Lucky [name] for an opinion and she is scheduled a procedure on this coming Thursday.  other than that the pain and swelling continues and she has no other acute problems. 09/25/2014 -- she had a recent procedure done by Dr. Lucky [name] for diffuse SFA stenosis, popliteal occlusion and tibial disease. she had at angioplasty of the left peroneal artery and tibioperoneal trunk, left superficial femoral artery, left popliteal artery, distal SFA and popliteal artery. the patient is experiencing some swelling of the left lower extremity after the procedure. 10/09/2014 -- she has an appointment to see her vascular surgeon again this weekend for possibly a vascular Doppler. No other issues. Objective Constitutional Vitals Time Taken: 9:37 AM, Height: 67 in, Weight: 159 lbs, BMI: 24.9, Temperature: 97.8 F, Pulse: 89 bpm, Respiratory Rate: 18 breaths/min, Blood Pressure: 140/75 mmHg. Integumentary (Hair, Skin) Wound #1 status is Open. Original cause of wound was Gradually Appeared. The wound is located on the Left,Lateral Lower Leg. The wound measures 6.9cm length x 2.5cm width x 0.2cm depth; 13.548cm^2 area and 2.71cm^3 volume. The wound is limited to  skin breakdown. There is no tunneling or undermining noted. There is a medium amount of serosanguineous drainage noted. The wound margin is distinct with the outline attached to the wound base. There is medium (34-66%) red, pink granulation within the wound bed. There is a medium (34-66%) amount of necrotic tissue within the wound bed including Adherent Slough. The periwound skin appearance exhibited: Localized Edema, Moist, Mottled. The periwound skin appearance did not exhibit: Callus, Crepitus, Excoriation, Fluctuance, Friable, Induration, Rash, Scarring, Dry/Scaly, Maceration, Atrophie Blanche, Cyanosis, Ecchymosis, Hemosiderin Staining, Pallor, Rubor, Erythema. Periwound temperature was noted as No Abnormality. The periwound has tenderness on palpation. Assessment Active Problems ICD-10 I70.248 - Atherosclerosis of native arteries of left leg with ulceration of other part of lower left leg L97.222 - Non-pressure chronic ulcer of left calf with fat layer exposed L03.116 - Cellulitis of left lower limb [name], Kara L. ([id number]) Procedures Wound #1 Wound #1 is a Venous Leg Ulcer located on the Left,Lateral Lower Leg . There was a Skin/Subcutaneous Tissue Debridement ([id number]) debridement with total area of 17.25 sq cm performed by Kara Companion, MD. with the following instrument(s): Curette to remove Viable and Non-Viable tissue/material including Exudate, Fibrin/Slough, Skin, and Subcutaneous after achieving pain control using Other (lidocaine 4%). A time out was conducted prior to the start of the procedure. A Large amount of bleeding was controlled with Pressure. The procedure was tolerated well with a pain level of 1 throughout and a pain level of 2 following the procedure. Post Debridement Measurements: 6.9cm length x 2.5cm width x 0.3cm depth; 4.064cm^3 volume. Post procedure Diagnosis Wound #1: Same as Pre-Procedure Plan Wound Cleansing: Wound #1 Left,Lateral Lower  Leg: May Shower, gently pat wound dry prior to applying new dressing. May shower with protection. No tub bath. Skin Barriers/Peri-Wound Care: Wound #1 Left,Lateral Lower Leg: Barrier cream Primary Wound Dressing: Wound #1 Left,Lateral Lower Leg: Aquacel Ag Secondary Dressing: Wound #1 Left,Lateral Lower Leg: ABD pad Drawtex Dressing Change Frequency: Wound #1 Left,Lateral Lower Leg: Change dressing every week Follow-up Appointments: Wound #1 Left,Lateral Lower Leg: Return Appointment in 1 week. Edema Control: Wound #1 Left,Lateral Lower Leg: 2 Layer Lite Compression System - Left Lower Extremity Kara, [name] ([id number]) Follow-Up Appointments: A Patient Clinical Summary of Care was provided to LS Left leg ulcer dressed withsilver alginate Electronic Signature(s) Signed: 10/16/2014 11:52:06 AM By: Kara Companion MD Entered By: [name] on 10/16/2014 11:52:06 [name] ([id number]) -------------------------------------------------------------------------------- SuperBill Details Patient Name: [name]. Date of Service: 10/16/2014 Medical Record  Patient Account Number: [id number] Number: Afful, RN, BSN, Treating RN: 05-Aug-1922 (81 y.o. [name] Date of Birth/Sex: Female) Other Clinician: Primary Care Physician: [name], [name] Referring Physician: Einar [name] Physician/Extender: [name] in Treatment: 5 Diagnosis Coding ICD-10 Codes Code Description I70.248 Atherosclerosis of native arteries of left leg with ulceration of other part of lower left leg L97.222 Non-pressure chronic ulcer of left calf with fat layer exposed L03.116 Cellulitis of left lower limb Facility Procedures CPT4 Code: [id number] Description: 56387 - DEB SUBQ TISSUE 20 SQ CM/< ICD-10 Description Diagnosis L97.222 Non-pressure chronic ulcer of left calf with fat lay Modifier: er exposed Quantity: 1 Physician Procedures CPT4: Description Modifier  Quantity Code [id number] 51884 - WC PHYS LEVEL 2 - EST PT 1 ICD-10 Description Diagnosis I70.248 Atherosclerosis of native arteries of left leg with ulceration of other part of lower left leg CPT4: 1660630 11042 - WC PHYS SUBQ TISS 20 SQ CM 1 ICD-10 Description Diagnosis L97.222 Non-pressure chronic ulcer of left calf with fat layer exposed Electronic Signature(s) Signed: 10/16/2014 11:52:46 AM By: Kara Companion MD Entered By: [name] on 10/16/2014 11:52:46

## 2014-10-17 ENCOUNTER — Telehealth: Payer: Self-pay | Admitting: *Deleted

## 2014-10-17 NOTE — Telephone Encounter (Signed)
pts daughter called requesting an extension on pts stay at [facility].  States pts last day is to be 9.2.16.  Please advise

## 2014-10-18 NOTE — Telephone Encounter (Signed)
pts granddaughter called upset.  Gave phone to Dr Nicki Reaper to listen to message.

## 2014-10-18 NOTE — Telephone Encounter (Signed)
I called pts daughter, attempted to give MDs message.  pts daughter went into detail about pts history while at [facility].  She states pt was not able to receive PT at the facility due to a wound developing.  Pt then had to be treated by the wound clinic.  Also states pt is unable to transport self from the bed to the wheelchair.  She is demanding a letter from Dr Nicki Reaper stating pt still needs PT at [facility].  States if Dr Nicki Reaper is unable to help with this then they both were going to change MDs.  She became very emotional and started crying before hanging up.  Please advise

## 2014-10-18 NOTE — Telephone Encounter (Signed)
They will need to discuss this with the social worker, physician and staff at the facility.  I cannot extend the stay at rehab or skilled nursing.  She needs to express her concern at the facility and see what their options include.  Let me know if any problems.

## 2014-10-19 ENCOUNTER — Encounter: Payer: Medicare Other | Attending: Surgery

## 2014-10-19 DIAGNOSIS — L97222 Non-pressure chronic ulcer of left calf with fat layer exposed: Secondary | ICD-10-CM | POA: Insufficient documentation

## 2014-10-19 DIAGNOSIS — I2510 Atherosclerotic heart disease of native coronary artery without angina pectoris: Secondary | ICD-10-CM | POA: Diagnosis not present

## 2014-10-19 DIAGNOSIS — I70248 Atherosclerosis of native arteries of left leg with ulceration of other part of lower left leg: Secondary | ICD-10-CM | POA: Insufficient documentation

## 2014-10-19 DIAGNOSIS — E119 Type 2 diabetes mellitus without complications: Secondary | ICD-10-CM | POA: Insufficient documentation

## 2014-10-19 DIAGNOSIS — I4891 Unspecified atrial fibrillation: Secondary | ICD-10-CM | POA: Diagnosis not present

## 2014-10-19 DIAGNOSIS — L03116 Cellulitis of left lower limb: Secondary | ICD-10-CM | POA: Diagnosis not present

## 2014-10-19 DIAGNOSIS — I10 Essential (primary) hypertension: Secondary | ICD-10-CM | POA: Insufficient documentation

## 2014-10-19 NOTE — Telephone Encounter (Signed)
Called and spoke to pts daughter 10/18/14 (1815).  I explained to her that I had talked with the Education officer, museum and Building services engineer at [facility].  Had discussed with them if there was anything I could write or do that would help extend her stay.  The administrator informed me that OT was going to reassess her.  They were also going to do a home assessment to see what was needed for discharge.  This would allow her to stay longer in the skilled facility.  I explained the insurance requirements and discussed that certain criteria have to be met for someone to stay in a skilled facility.  This was discussed at length with the daughter.  After reviewing her message and listening to the granddaughter's message, I informed her to make sure she makes me aware of her new physician so that we can help facilitate transfer of her records and maintain continuity of care.  I discussed this in detail with her as well and explained that she would need follow up with her new physician after discharge from the facility.

## 2014-10-19 NOTE — Progress Notes (Signed)
LOUVINA, CLEARY ([id number]) Visit Report for 10/19/2014 Arrival Information Details Patient Name: [name], [name]. Date of Service: 10/19/2014 1:00 PM Medical Record Patient Account Number: [id number] Number: Afful, RN, BSN, Treating RN: Nov 30, 1922 (81 y.o. Velva Harman Date of Birth/Sex: Female) Other Clinician: Primary Care Physician: Einar Pheasant Treating Christin Fudge Referring Physician: Einar Pheasant Physician/Extender: Suella Grove in Treatment: 6 Visit Information History Since Last Visit Any new allergies or adverse reactions: No Patient Arrived: Wheel Chair Had a fall or experienced change in No Arrival Time: 12:50 activities of daily [name] that may affect Accompanied By: dtr risk of falls: Transfer Assistance: None Has Dressing in Place as Prescribed: No Patient Identification Verified: Yes Pain Present Now: No Secondary Verification Process Yes Completed: Patient Requires Transmission- No Based Precautions: Patient Has Alerts: Yes Patient Alerts: Patient on Blood Thinner Eliquis. R ABI:0.96 Electronic Signature(s) Signed: 10/19/2014 1:01:41 PM By: Regan Lemming BSN, RN Entered By: Regan Lemming on 10/19/2014 13:01:41 [name] ([id number]) -------------------------------------------------------------------------------- Encounter Discharge Information Details Patient Name: [name] Date of Service: 10/19/2014 1:00 PM Medical Record Patient Account Number: [id number] Number: Afful, RN, BSN, Treating RN: Dec 01, 1922 (81 y.o. Velva Harman Date of Birth/Sex: Female) Other Clinician: Primary Care Physician: Einar Pheasant Treating Christin Fudge Referring Physician: Einar Pheasant Physician/Extender: Suella Grove in Treatment: 6 Encounter Discharge Information Items Discharge Pain Level: 0 Discharge Condition: Stable Ambulatory Status: Wheelchair Nursing Discharge Destination: Home Transportation: Other Schedule Follow-up Appointment:  No Medication Reconciliation completed No and provided to Patient/Care Camary Sosa: Clinical Summary of Care: Electronic Signature(s) Signed: 10/19/2014 1:02:46 PM By: Regan Lemming BSN, RN Entered By: Regan Lemming on 10/19/2014 13:02:46 [name] ([id number]) -------------------------------------------------------------------------------- Patient/Caregiver Education Details Patient Name: [name] Date of Service: 10/19/2014 1:00 PM Medical Record Patient Account Number: [id number] Number: Afful, RN, BSN, Treating RN: 10-05-1922 (81 y.o. Velva Harman Date of Birth/Gender: Female) Other Clinician: Primary Care Physician: Einar Pheasant Treating Christin Fudge Referring Physician: Einar Pheasant Physician/Extender: Suella Grove in Treatment: 6 Education Assessment Education Provided To: Patient and Caregiver Education Topics Provided Welcome To [place]: Methods: Explain/Verbal Responses: State content correctly Electronic Signature(s) Signed: 10/19/2014 1:02:32 PM By: Regan Lemming BSN, RN Entered By: Regan Lemming on 10/19/2014 13:02:32

## 2014-10-23 ENCOUNTER — Encounter: Payer: Medicare Other | Admitting: Surgery

## 2014-10-23 DIAGNOSIS — L97222 Non-pressure chronic ulcer of left calf with fat layer exposed: Secondary | ICD-10-CM | POA: Diagnosis not present

## 2014-10-23 NOTE — Progress Notes (Addendum)
Kara, [name] ([id number]) Visit Report for 10/23/2014 Chief Complaint Document Details Patient Name: [name], [name]. Date of Service: 10/23/2014 9:30 AM Medical Record Number: [id number] Patient Account Number: [id number] Date of Birth/Sex: Oct 30, 1922 (81 y.o. Female) Treating RN: Cornell Barman Primary Care Physician: Einar Pheasant Other Clinician: Referring Physician: Einar Pheasant Treating Physician/Extender: Frann Rider in Treatment: 6 Information Obtained from: Patient Chief Complaint Patient presents to the wound care center today with an open arterial ulcer. The patient has had left lower extremity ulceration for about 1 month. Electronic Signature(s) Signed: 10/23/2014 9:49:38 AM By: Christin Fudge MD, FACS Entered By: Christin Fudge on 10/23/2014 09:49:38 [name] ([id number]) -------------------------------------------------------------------------------- HPI Details Patient Name: [name] Date of Service: 10/23/2014 9:30 AM Medical Record Number: [id number] Patient Account Number: [id number] Date of Birth/Sex: May 08, 1922 (81 y.o. Female) Treating RN: Cornell Barman Primary Care Physician: Einar Pheasant Other Clinician: Referring Physician: Einar Pheasant Treating Physician/Extender: Frann Rider in Treatment: 6 History of Present Illness Location: left lower extremity ulceration Quality: Patient reports experiencing burning to affected area(s). Severity: Patient states wound are getting worse. Duration: Patient has had the wound for < 4 weeks prior to presenting for treatment Timing: Pain in wound is constant (hurts all the time) Context: The wound appeared gradually over time Modifying Factors: Other treatment(s) tried include:has been on oral anti-buttocks since the beginning of the month and had 10 days of Augmentin and now is on doxycycline. Associated Signs and Symptoms: Patient reports having difficulty standing for long periods. HPI  Description: 81 year old patient was admitted to [facility] between 08/13/2014 and 08/17/2014 with cellulitis of the left lower extremity.duplex study done then showed no DVT and the patient was on IV vancomycin and Rocephin and also received Unasyn later. The patient was sent home on 10 days of Augmentin. Her other comorbidities of atrial fibrillation, essential hypertension, diabetes mellitus type 2, coronary artery disease were managed with appropriate medications. on 09/04/2014 she was seen in the ER and was found to have a 10 cm ulceration on the left lower extremity lateral and superior to the lateral malleolus. The granulation tissue was noted and she was referred to the wound center. the ER notes that her ABIs were within normal limits and she was sent home on consultative management. a duplex scan of the lower extremities was done by the nursing home and this showed that the right ABI was 0.96 with a multiphasic waveform throughout the right leg. There was a monophasic waveform of the left leg suggesting left external iliac artery stenosis with blunting distally and the ABI could not be calculated on the left side. Moderate to severe ischemia was suspected of the left leg and a MR angiogram was recommended. 09/17/2014 -- Xray Left tibia and fibula --- unremarkable tibia and fibula Her wound culture is back and this has grown a heavy growth of staph aureus sensitive to ciprofloxacin and clindamycin and Bactrim. She has been on doxycycline since July 14 for 10 days and we will review her clinically before switching her antibiotics if need be. After reviewing her chart she is not going to be here till 09/17/2014 and hence I will call in Bactrim DS 1 twice a day for 14 days. She has seen Dr. Lucky Cowboy for an opinion and she is scheduled a procedure on this coming Thursday. other than that the pain and swelling continues and she has no other acute problems. 09/25/2014 -- she  had a recent procedure done by Dr. Lucky Cowboy  for diffuse SFA stenosis, popliteal occlusion and tibial disease. she had at angioplasty of the left peroneal artery and tibioperoneal trunk, left superficial femoral artery, left popliteal artery, distal SFA and popliteal artery. the patient is experiencing some swelling of the left lower extremity after the procedure. LAKETIA, VICKNAIR ([id number]) 10/09/2014 -- she has an appointment to see her vascular surgeon again this weekend for possibly a vascular Doppler. No other issues. 10/23/2014 -- The patient was recently seen by Dr. Lucky Cowboy on this past Friday. We do not have his reports but we do know that he was pleased with her progress and did not plan any further procedure in the near future. Electronic Signature(s) Signed: 10/23/2014 10:06:19 AM By: Christin Fudge MD, FACS Entered By: Christin Fudge on 10/23/2014 94:17:40 [name] ([id number]) -------------------------------------------------------------------------------- Physical Exam Details Patient Name: [name]. Date of Service: 10/23/2014 9:30 AM Medical Record Number: [id number] Patient Account Number: [id number] Date of Birth/Sex: 1922/08/24 (81 y.o. Female) Treating RN: Cornell Barman Primary Care Physician: Einar Pheasant Other Clinician: Referring Physician: Einar Pheasant Treating Physician/Extender: Frann Rider in Treatment: 6 Constitutional . Pulse regular. Respirations normal and unlabored. Afebrile. . Eyes Nonicteric. Reactive to light. Ears, Nose, Mouth, and Throat Lips, teeth, and gums WNL.Marland Kitchen Moist mucosa without lesions . Neck supple and nontender. No palpable supraclavicular or cervical adenopathy. Normal sized without goiter. Respiratory WNL. No retractions.. Cardiovascular Pedal Pulses WNL. No clubbing, cyanosis or edema. Lymphatic No adneopathy. No adenopathy. No adenopathy. Musculoskeletal Adexa without tenderness or enlargement.. Digits and nails  w/o clubbing, cyanosis, infection, petechiae, ischemia, or inflammatory conditions.. Integumentary (Hair, Skin) No suspicious lesions. No crepitus or fluctuance. No peri-wound warmth or erythema. No masses.Marland Kitchen Psychiatric Judgement and insight Intact.. No evidence of depression, anxiety, or agitation.. Notes With a light 2 layer compression the edema has gone down remarkably and the wound is looking very good. We will continue to dress it locally with light compression. Electronic Signature(s) Signed: 10/23/2014 10:06:56 AM By: Christin Fudge MD, FACS Entered By: Christin Fudge on 10/23/2014 10:06:55 [name] ([id number]) -------------------------------------------------------------------------------- Physician Orders Details Patient Name: [name] Date of Service: 10/23/2014 9:30 AM Medical Record Number: [id number] Patient Account Number: [id number] Date of Birth/Sex: May 05, 1922 (81 y.o. Female) Treating RN: Cornell Barman Primary Care Physician: Einar Pheasant Other Clinician: Referring Physician: Einar Pheasant Treating Physician/Extender: Frann Rider in Treatment: 6 Verbal / Phone Orders: Yes Clinician: Cornell Barman Read Back and Verified: Yes Diagnosis Coding ICD-10 Coding Code Description I70.248 Atherosclerosis of native arteries of left leg with ulceration of other part of lower left leg L97.222 Non-pressure chronic ulcer of left calf with fat layer exposed L03.116 Cellulitis of left lower limb Wound Cleansing Wound #1 Left,Lateral Lower Leg o May Shower, gently pat wound dry prior to applying new dressing. o May shower with protection. o No tub bath. Skin Barriers/Peri-Wound Care Wound #1 Left,Lateral Lower Leg o Barrier cream Primary Wound Dressing Wound #1 Left,Lateral Lower Leg o Prisma Ag Secondary Dressing Wound #1 Left,Lateral Lower Leg o ABD pad o Drawtex Dressing Change Frequency Wound #1 Left,Lateral Lower Leg o Change  dressing every week Follow-up Appointments Wound #1 Left,Lateral Lower Leg o Return Appointment in 1 week. Edema Control ARIEONNA, MEDINE. ([id number]) Wound #1 Left,Lateral Lower Leg o 2 Layer Lite Compression System - Left Lower Extremity - If toes turn blue or patient has pain, remove wrap immediately. Electronic Signature(s) Signed: 10/23/2014 4:40:03 PM By: Christin Fudge MD, FACS Signed: 10/23/2014 5:22:53 PM By: Gretta Cool, RN,  BSN, Leisure centre manager, BSN Entered By: Gretta Cool, RN, BSN, Kim on 10/23/2014 09:54:14 [name] ([id number]) -------------------------------------------------------------------------------- Problem List Details Patient Name: [name] Date of Service: 10/23/2014 9:30 AM Medical Record Number: [id number] Patient Account Number: [id number] Date of Birth/Sex: Aug 26, 1922 (81 y.o. Female) Treating RN: Cornell Barman Primary Care Physician: Einar Pheasant Other Clinician: Referring Physician: Einar Pheasant Treating Physician/Extender: Frann Rider in Treatment: 6 Active Problems ICD-10 Encounter Code Description Active Date Diagnosis I70.248 Atherosclerosis of native arteries of left leg with ulceration 09/07/2014 Yes of other part of lower left leg L97.222 Non-pressure chronic ulcer of left calf with fat layer 09/07/2014 Yes exposed L03.116 Cellulitis of left lower limb 09/07/2014 Yes Inactive Problems Resolved Problems Electronic Signature(s) Signed: 10/23/2014 9:49:31 AM By: Christin Fudge MD, FACS Entered By: Christin Fudge on 10/23/2014 09:49:31 [name] ([id number]) -------------------------------------------------------------------------------- Progress Note Details Patient Name: [name]. Date of Service: 10/23/2014 9:30 AM Medical Record Number: [id number] Patient Account Number: [id number] Date of Birth/Sex: 1922-11-04 (81 y.o. Female) Treating RN: Cornell Barman Primary Care Physician: Einar Pheasant Other Clinician: Referring  Physician: Einar Pheasant Treating Physician/Extender: Frann Rider in Treatment: 6 Subjective Chief Complaint Information obtained from Patient Patient presents to the wound care center today with an open arterial ulcer. The patient has had left lower extremity ulceration for about 1 month. History of Present Illness (HPI) The following HPI elements were documented for the patient's wound: Location: left lower extremity ulceration Quality: Patient reports experiencing burning to affected area(s). Severity: Patient states wound are getting worse. Duration: Patient has had the wound for < 4 weeks prior to presenting for treatment Timing: Pain in wound is constant (hurts all the time) Context: The wound appeared gradually over time Modifying Factors: Other treatment(s) tried include:has been on oral anti-buttocks since the beginning of the month and had 10 days of Augmentin and now is on doxycycline. Associated Signs and Symptoms: Patient reports having difficulty standing for long periods. 81 year old patient was admitted to [facility] between 08/13/2014 and 08/17/2014 with cellulitis of the left lower extremity.duplex study done then showed no DVT and the patient was on IV vancomycin and Rocephin and also received Unasyn later. The patient was sent home on 10 days of Augmentin. Her other comorbidities of atrial fibrillation, essential hypertension, diabetes mellitus type 2, coronary artery disease were managed with appropriate medications. on 09/04/2014 she was seen in the ER and was found to have a 10 cm ulceration on the left lower extremity lateral and superior to the lateral malleolus. The granulation tissue was noted and she was referred to the wound center. the ER notes that her ABIs were within normal limits and she was sent home on consultative management. a duplex scan of the lower extremities was done by the nursing home and this showed that the  right ABI was 0.96 with a multiphasic waveform throughout the right leg. There was a monophasic waveform of the left leg suggesting left external iliac artery stenosis with blunting distally and the ABI could not be calculated on the left side. Moderate to severe ischemia was suspected of the left leg and a MR angiogram was recommended. 09/17/2014 -- Xray Left tibia and fibula --- unremarkable tibia and fibula Her wound culture is back and this has grown a heavy growth of staph aureus sensitive to ciprofloxacin and clindamycin and Bactrim. She has been on doxycycline since July 14 for 10 days and we will review her clinically before switching her antibiotics if need  be. After reviewing her chart she is not going to be here till 09/17/2014 and hence I will Nicastro, Kriste L. ([id number]) call in Bactrim DS 1 twice a day for 14 days. She has seen Dr. Lucky Cowboy for an opinion and she is scheduled a procedure on this coming Thursday. other than that the pain and swelling continues and she has no other acute problems. 09/25/2014 -- she had a recent procedure done by Dr. Lucky Cowboy for diffuse SFA stenosis, popliteal occlusion and tibial disease. she had at angioplasty of the left peroneal artery and tibioperoneal trunk, left superficial femoral artery, left popliteal artery, distal SFA and popliteal artery. the patient is experiencing some swelling of the left lower extremity after the procedure. 10/09/2014 -- she has an appointment to see her vascular surgeon again this weekend for possibly a vascular Doppler. No other issues. 10/23/2014 -- The patient was recently seen by Dr. Lucky Cowboy on this past Friday. We do not have his reports but we do know that he was pleased with her progress and did not plan any further procedure in the near future. Objective Constitutional Pulse regular. Respirations normal and unlabored. Afebrile. Vitals Time Taken: 9:39 AM, Height: 67 in, Weight: 159 lbs, BMI: 24.9, Temperature: 97.9  F, Pulse: 80 bpm, Respiratory Rate: 20 breaths/min, Blood Pressure: 113/93 mmHg. Eyes Nonicteric. Reactive to light. Ears, Nose, Mouth, and Throat Lips, teeth, and gums WNL.Marland Kitchen Moist mucosa without lesions . Neck supple and nontender. No palpable supraclavicular or cervical adenopathy. Normal sized without goiter. Respiratory WNL. No retractions.. Cardiovascular Pedal Pulses WNL. No clubbing, cyanosis or edema. Lymphatic No adneopathy. No adenopathy. No adenopathy. Musculoskeletal Adexa without tenderness or enlargement.. Digits and nails w/o clubbing, cyanosis, infection, petechiae, Shehata, Joycie L. ([id number]) ischemia, or inflammatory conditions.Marland Kitchen Psychiatric Judgement and insight Intact.. No evidence of depression, anxiety, or agitation.. General Notes: With a light 2 layer compression the edema has gone down remarkably and the wound is looking very good. We will continue to dress it locally with light compression. Integumentary (Hair, Skin) No suspicious lesions. No crepitus or fluctuance. No peri-wound warmth or erythema. No masses.. Wound #1 status is Open. Original cause of wound was Gradually Appeared. The wound is located on the Left,Lateral Lower Leg. The wound measures 6.1cm length x 2.4cm width x 0.2cm depth; 11.498cm^2 area and 2.3cm^3 volume. The wound is limited to skin breakdown. There is no tunneling or undermining noted. There is a medium amount of serosanguineous drainage noted. The wound margin is distinct with the outline attached to the wound base. There is medium (34-66%) red, pink granulation within the wound bed. There is a medium (34-66%) amount of necrotic tissue within the wound bed including Adherent Slough. The periwound skin appearance exhibited: Moist, Mottled. The periwound skin appearance did not exhibit: Callus, Crepitus, Excoriation, Fluctuance, Friable, Induration, Localized Edema, Rash, Scarring, Dry/Scaly, Maceration, Atrophie Blanche,  Cyanosis, Ecchymosis, Hemosiderin Staining, Pallor, Rubor, Erythema. Periwound temperature was noted as No Abnormality. The periwound has tenderness on palpation. Assessment Active Problems ICD-10 I70.248 - Atherosclerosis of native arteries of left leg with ulceration of other part of lower left leg L97.222 - Non-pressure chronic ulcer of left calf with fat layer exposed L03.116 - Cellulitis of left lower limb I have recommended a application of silver collagen and a light 2 layer compression to be left on for the entire week. We have also cautioned her that in case there is any problem with her toes turning blue or severe pain they should release the light compression and  call us or go to the ER. She will come back and see me next week. Plan ALLEYA, DEMETER ([id number]) Wound Cleansing: Wound #1 Left,Lateral Lower Leg: May Shower, gently pat wound dry prior to applying new dressing. May shower with protection. No tub bath. Skin Barriers/Peri-Wound Care: Wound #1 Left,Lateral Lower Leg: Barrier cream Primary Wound Dressing: Wound #1 Left,Lateral Lower Leg: Prisma Ag Secondary Dressing: Wound #1 Left,Lateral Lower Leg: ABD pad Drawtex Dressing Change Frequency: Wound #1 Left,Lateral Lower Leg: Change dressing every week Follow-up Appointments: Wound #1 Left,Lateral Lower Leg: Return Appointment in 1 week. Edema Control: Wound #1 Left,Lateral Lower Leg: 2 Layer Lite Compression System - Left Lower Extremity - If toes turn blue or patient has pain, remove wrap immediately. I have recommended a application of silver collagen and a light 2 layer compression to be left on for the entire week. We have also cautioned her that in case there is any problem with her toes turning blue or severe pain they should release the light compression and call us or go to the ER. She will come back and see me next week. Electronic Signature(s) Signed: 10/23/2014 10:08:21 AM By: Christin Fudge  MD, FACS Entered By: Christin Fudge on 10/23/2014 10:08:21 [name] ([id number]) -------------------------------------------------------------------------------- SuperBill Details Patient Name: [name] Date of Service: 10/23/2014 Medical Record Number: [id number] Patient Account Number: [id number] Date of Birth/Sex: Oct 08, 1922 (81 y.o. Female) Treating RN: Cornell Barman Primary Care Physician: Einar Pheasant Other Clinician: Referring Physician: Einar Pheasant Treating Physician/Extender: Frann Rider in Treatment: 6 Diagnosis Coding ICD-10 Codes Code Description [phone] Atherosclerosis of native arteries of left leg with ulceration of other part of lower left leg L97.222 Non-pressure chronic ulcer of left calf with fat layer exposed L03.116 Cellulitis of left lower limb Facility Procedures CPT4: Description Modifier Quantity Code [id number] (Facility Use Only) [phone] - [facility] LWR LT 1 LEG Physician Procedures CPT4: Description Modifier Quantity Code [id number] - WC PHYS LEVEL 3 - EST PT 1 ICD-10 Description Diagnosis I70.248 Atherosclerosis of native arteries of left leg with ulceration of other part of lower left leg L97.222 Non-pressure chronic ulcer of  left calf with fat layer exposed L03.116 Cellulitis of left lower limb Electronic Signature(s) Signed: 10/23/2014 10:08:36 AM By: Christin Fudge MD, FACS Entered By: Christin Fudge on 10/23/2014 10:08:36

## 2014-10-24 NOTE — Progress Notes (Signed)
EUDORA, GUEVARRA ([id number]) Visit Report for 10/23/2014 Arrival Information Details Patient Name: [name], [name]. Date of Service: 10/23/2014 9:30 AM Medical Record Number: [id number] Patient Account Number: [id number] Date of Birth/Sex: 10/13/1922 (81 y.o. Female) Treating RN: Cornell Barman Primary Care Physician: Einar Pheasant Other Clinician: Referring Physician: Einar Pheasant Treating Physician/Extender: Frann Rider in Treatment: 6 Visit Information History Since Last Visit Added or deleted any medications: No Patient Arrived: Wheel Chair Any new allergies or adverse reactions: No Arrival Time: 09:35 Had a fall or experienced change in No Accompanied By: daughter and activities of daily [name] that may affect caregiver risk of falls: Transfer Assistance: Manual Signs or symptoms of abuse/neglect since last No Patient Identification Verified: Yes visito Secondary Verification Process Yes Hospitalized since last visit: No Completed: Has Dressing in Place as Prescribed: Yes Patient Requires Transmission- No Pain Present Now: No Based Precautions: Patient Has Alerts: Yes Patient Alerts: Patient on Blood Thinner Eliquis. R ABI:0.96 Electronic Signature(s) Signed: 10/23/2014 5:22:53 PM By: Gretta Cool, RN, BSN, Kim RN, BSN Entered By: Gretta Cool, RN, BSN, Kim on 10/23/2014 09:36:00 [name] ([id number]) -------------------------------------------------------------------------------- Encounter Discharge Information Details Patient Name: [name] Date of Service: 10/23/2014 9:30 AM Medical Record Number: [id number] Patient Account Number: [id number] Date of Birth/Sex: April 30, 1922 (81 y.o. Female) Treating RN: Cornell Barman Primary Care Physician: Einar Pheasant Other Clinician: Referring Physician: Einar Pheasant Treating Physician/Extender: Frann Rider in Treatment: 6 Encounter Discharge Information Items Discharge Pain Level: 0 Discharge Condition:  Stable Ambulatory Status: Wheelchair Discharge Destination: Home Transportation: Private Auto daughter and Accompanied By: caregiver Schedule Follow-up Appointment: Yes Medication Reconciliation completed and provided to Patient/Care Yes Kendarius Vigen: Provided on Clinical Summary of Care: 10/23/2014 Form Type Recipient Paper Patient LS Electronic Signature(s) Signed: 10/23/2014 10:05:11 AM By: Ruthine Dose Entered By: Ruthine Dose on 10/23/2014 10:05:11 [name] ([id number]) -------------------------------------------------------------------------------- Lower Extremity Assessment Details Patient Name: [name] Date of Service: 10/23/2014 9:30 AM Medical Record Number: [id number] Patient Account Number: [id number] Date of Birth/Sex: 29-Dec-1922 (81 y.o. Female) Treating RN: Cornell Barman Primary Care Physician: Einar Pheasant Other Clinician: Referring Physician: Einar Pheasant Treating Physician/Extender: Frann Rider in Treatment: 6 Edema Assessment Assessed: [Left: No] [Right: No] E[Left: dema] [Right: :] Calf Left: Right: Point of Measurement: cm From Medial Instep 29.8 cm cm Ankle Left: Right: Point of Measurement: cm From Medial Instep 19 cm cm Vascular Assessment Pulses: Posterior Tibial Dorsalis Pedis Palpable: [Left:No] Doppler: [Left:Monophasic] Extremity colors, hair growth, and conditions: Extremity Color: [Left:Normal] Hair Growth on Extremity: [Left:Yes] Temperature of Extremity: [Left:Warm] Capillary Refill: [Left:< 3 seconds] Toe Nail Assessment Left: Right: Thick: Yes Discolored: Yes Deformed: Yes Improper Length and Hygiene: Yes Electronic Signature(s) Signed: 10/23/2014 5:22:53 PM By: Gretta Cool, RN, BSN, Kim RN, BSN Entered By: Gretta Cool, RN, BSN, Kim on 10/23/2014 09:45:09 [name] ([id number] Dresser, Dolly Rias ([id number]) -------------------------------------------------------------------------------- Multi Wound Chart  Details Patient Name: [name]. Date of Service: 10/23/2014 9:30 AM Medical Record Number: [id number] Patient Account Number: [id number] Date of Birth/Sex: [birth year] (81 y.o. Female) Treating RN: Cornell Barman Primary Care Physician: Einar Pheasant Other Clinician: Referring Physician: Einar Pheasant Treating Physician/Extender: Frann Rider in Treatment: 6 Vital Signs Height(in): 67 Pulse(bpm): 80 Weight(lbs): 159 Blood Pressure 113/93 (mmHg): Body Mass Index(BMI): 25 Temperature(F): 97.9 Respiratory Rate 20 (breaths/min): Photos: [1:No Photos] [N/A:N/A] Wound Location: [1:Left Lower Leg - Lateral] [N/A:N/A] Wounding Event: [1:Gradually Appeared] [N/A:N/A] Primary Etiology: [1:Venous Leg Ulcer] [N/A:N/A] Secondary Etiology: [1:Cellulitis] [N/A:N/A] Comorbid History: [1:Cataracts, Arrhythmia, Hypertension, Peripheral Arterial Disease,  Peripheral Venous Disease, Osteoarthritis, Confinement Anxiety] [N/A:N/A] Date Acquired: [1:08/17/2014] [N/A:N/A] Weeks of Treatment: [1:6] [N/A:N/A] Wound Status: [1:Open] [N/A:N/A] Measurements L x W x D 6.1x2.4x0.2 [N/A:N/A] (cm) Area (cm) : [1:11.498] [N/A:N/A] Volume (cm) : [1:2.3] [N/A:N/A] % Reduction in Area: [1:56.40%] [N/A:N/A] % Reduction in Volume: 56.40% [N/A:N/A] Classification: [1:Full Thickness Without Exposed Support Structures] [N/A:N/A] Exudate Amount: [1:Medium] [N/A:N/A] Exudate Type: [1:Serosanguineous] [N/A:N/A] Exudate Color: [1:red, brown] [N/A:N/A] Wound Margin: [1:Distinct, outline attached] [N/A:N/A] Granulation Amount: [1:Medium (34-66%)] [N/A:N/A] Granulation Quality: [1:Red, Pink, Hyper- granulation] [N/A:N/A] Necrotic Amount: Medium (34-66%) N/A N/A Exposed Structures: Fascia: No N/A N/A Fat: No Tendon: No Muscle: No Joint: No Bone: No Limited to Skin Breakdown Epithelialization: Medium (34-66%) N/A N/A Periwound Skin Texture: Edema: No N/A N/A Excoriation: No Induration: No Callus:  No Crepitus: No Fluctuance: No Friable: No Rash: No Scarring: No Periwound Skin Moist: Yes N/A N/A Moisture: Maceration: No Dry/Scaly: No Periwound Skin Color: Mottled: Yes N/A N/A Atrophie Blanche: No Cyanosis: No Ecchymosis: No Erythema: No Hemosiderin Staining: No Pallor: No Rubor: No Temperature: No Abnormality N/A N/A Tenderness on Yes N/A N/A Palpation: Wound Preparation: Ulcer Cleansing: N/A N/A Rinsed/Irrigated with Saline Topical Anesthetic Applied: Other: lidocaine 4% Treatment Notes Electronic Signature(s) Signed: 10/23/2014 5:22:53 PM By: Gretta Cool, RN, BSN, Kim RN, BSN Entered By: Gretta Cool, RN, BSN, Kim on 10/23/2014 09:52:32 [name] ([id number]) -------------------------------------------------------------------------------- [place] Details Patient Name: [name] Date of Service: 10/23/2014 9:30 AM Medical Record Number: [id number] Patient Account Number: [id number] Date of Birth/Sex: 1922/12/06 (81 y.o. Female) Treating RN: Cornell Barman Primary Care Physician: Einar Pheasant Other Clinician: Referring Physician: Einar Pheasant Treating Physician/Extender: Frann Rider in Treatment: 6 Active Inactive Abuse / Safety / Falls / Self Care Management Nursing Diagnoses: Potential for falls Goals: Patient will remain injury free Date Initiated: 09/07/2014 Goal Status: Active Interventions: Assess fall risk on admission and as needed Notes: Nutrition Nursing Diagnoses: Potential for alteratiion in Nutrition/Potential for imbalanced nutrition Goals: Patient/caregiver agrees to and verbalizes understanding of need to use nutritional supplements and/or vitamins as prescribed Date Initiated: 09/07/2014 Goal Status: Active Interventions: Assess patient nutrition upon admission and as needed per policy Notes: Orientation to the Wound Care Program Nursing Diagnoses: Knowledge deficit related to the wound healing center  program Goals: Patient/caregiver will verbalize understanding of the [place], [place] ([id number]) Date Initiated: 09/07/2014 Goal Status: Active Interventions: Provide education on orientation to the wound center Notes: Wound/Skin Impairment Nursing Diagnoses: Impaired tissue integrity Goals: Patient/caregiver will verbalize understanding of skin care regimen Date Initiated: 09/07/2014 Goal Status: Active Ulcer/skin breakdown will heal within 14 weeks Date Initiated: 09/07/2014 Goal Status: Active Interventions: Assess patient/caregiver ability to obtain necessary supplies Assess ulceration(s) every visit Notes: Electronic Signature(s) Signed: 10/23/2014 5:22:53 PM By: Gretta Cool, RN, BSN, Kim RN, BSN Entered By: Gretta Cool, RN, BSN, Kim on 10/23/2014 09:52:23 [name] ([id number]) -------------------------------------------------------------------------------- Pain Assessment Details Patient Name: [name]. Date of Service: 10/23/2014 9:30 AM Medical Record Number: [id number] Patient Account Number: [id number] Date of Birth/Sex: 05-Apr-1922 (81 y.o. Female) Treating RN: Cornell Barman Primary Care Physician: Einar Pheasant Other Clinician: Referring Physician: Einar Pheasant Treating Physician/Extender: Frann Rider in Treatment: 6 Active Problems Location of Pain Severity and Description of Pain Patient Has Paino No Site Locations Pain Management and Medication Current Pain Management: Electronic Signature(s) Signed: 10/23/2014 5:22:53 PM By: Gretta Cool, RN, BSN, Kim RN, BSN Entered By: Gretta Cool, RN, BSN, Kim on 10/23/2014 09:39:20 [name] ([id number]) -------------------------------------------------------------------------------- Patient/Caregiver Education Details  Patient Name: ONA, ROEHRS. Date of Service: 10/23/2014 9:30 AM Medical Record Number: [id number] Patient Account Number: [id number] Date of Birth/Gender: 08-14-1922  (81 y.o. Female) Treating RN: Cornell Barman Primary Care Physician: Einar Pheasant Other Clinician: Referring Physician: Einar Pheasant Treating Physician/Extender: Frann Rider in Treatment: 6 Education Assessment Education Provided To: Patient Education Topics Provided Wound/Skin Impairment: Handouts: Caring for Your Ulcer, Other: continue wound care as prescribed Electronic Signature(s) Signed: 10/23/2014 5:22:53 PM By: Gretta Cool, RN, BSN, Kim RN, BSN Entered By: Gretta Cool, RN, BSN, Kim on 10/23/2014 10:02:46 [name] ([id number]) -------------------------------------------------------------------------------- Wound Assessment Details Patient Name: [name]. Date of Service: 10/23/2014 9:30 AM Medical Record Number: [id number] Patient Account Number: [id number] Date of Birth/Sex: [birth year] (81 y.o. Female) Treating RN: Cornell Barman Primary Care Physician: Einar Pheasant Other Clinician: Referring Physician: Einar Pheasant Treating Physician/Extender: Frann Rider in Treatment: 6 Wound Status Wound Number: 1 Primary Venous Leg Ulcer Etiology: Wound Location: Left Lower Leg - Lateral Secondary Cellulitis Wounding Event: Gradually Appeared Etiology: Date Acquired: 08/17/2014 Wound Open Weeks Of Treatment: 6 Status: Clustered Wound: No Comorbid Cataracts, Arrhythmia, Hypertension, History: Peripheral Arterial Disease, Peripheral Venous Disease, Osteoarthritis, Confinement Anxiety Photos Photo Uploaded By: Gretta Cool, RN, BSN, Kim on 10/23/2014 11:11:49 Wound Measurements Length: (cm) 6.1 Width: (cm) 2.4 Depth: (cm) 0.2 Area: (cm) 11.498 Volume: (cm) 2.3 % Reduction in Area: 56.4% % Reduction in Volume: 56.4% Epithelialization: Medium (34-66%) Tunneling: No Undermining: No Wound Description Classification: SEANNA, SISLER ([id number]) Foul Odor After Cleansing: No Full Thickness Without Exposed Support Structures Wound Margin: Distinct,  outline attached Exudate Medium Amount: Exudate Type: Serosanguineous Exudate Color: red, brown Wound Bed Granulation Amount: Medium (34-66%) Exposed Structure Granulation Quality: Red, Pink, Hyper-granulation Fascia Exposed: No Necrotic Amount: Medium (34-66%) Fat Layer Exposed: No Necrotic Quality: Adherent Slough Tendon Exposed: No Muscle Exposed: No Joint Exposed: No Bone Exposed: No Limited to Skin Breakdown Periwound Skin Texture Texture Color No Abnormalities Noted: No No Abnormalities Noted: No Callus: No Atrophie Blanche: No Crepitus: No Cyanosis: No Excoriation: No Ecchymosis: No Fluctuance: No Erythema: No Friable: No Hemosiderin Staining: No Induration: No Mottled: Yes Localized Edema: No Pallor: No Rash: No Rubor: No Scarring: No Temperature / Pain Moisture Temperature: No Abnormality No Abnormalities Noted: No Tenderness on Palpation: Yes Dry / Scaly: No Maceration: No Moist: Yes Wound Preparation Ulcer Cleansing: Rinsed/Irrigated with Saline Topical Anesthetic Applied: Other: lidocaine 4%, Electronic Signature(s) Signed: 10/23/2014 5:22:53 PM By: Gretta Cool, RN, BSN, Kim RN, BSN Entered By: Gretta Cool, RN, BSN, Kim on 10/23/2014 09:46:43 [name] ([id number]) -------------------------------------------------------------------------------- [place] Details Patient Name: [name] Date of Service: 10/23/2014 9:30 AM Medical Record Number: [id number] Patient Account Number: [id number] Date of Birth/Sex: 07/30/1922 (81 y.o. Female) Treating RN: Cornell Barman Primary Care Physician: Einar Pheasant Other Clinician: Referring Physician: Einar Pheasant Treating Physician/Extender: Frann Rider in Treatment: 6 Vital Signs Time Taken: 09:39 Temperature (F): 97.9 Height (in): 67 Pulse (bpm): 80 Weight (lbs): 159 Respiratory Rate (breaths/min): 20 Body Mass Index (BMI): 24.9 Blood Pressure (mmHg): 113/93 Reference Range: 80 - 120 mg /  dl Electronic Signature(s) Signed: 10/23/2014 5:22:53 PM By: Gretta Cool, RN, BSN, Kim RN, BSN Entered By: Gretta Cool, RN, BSN, Kim on 10/23/2014 09:41:24

## 2014-10-30 ENCOUNTER — Telehealth: Payer: Self-pay | Admitting: Internal Medicine

## 2014-10-30 ENCOUNTER — Encounter: Payer: Medicare Other | Admitting: Surgery

## 2014-10-30 DIAGNOSIS — L97222 Non-pressure chronic ulcer of left calf with fat layer exposed: Secondary | ICD-10-CM | POA: Diagnosis not present

## 2014-10-30 NOTE — Telephone Encounter (Signed)
Pt son [name] [phone] called about needing help from DR Nicki Reaper to keep his mom in the hospital. Son states that pt can not walk anymore,now she has to have two people to help her stand up. Son said that pt needs to get more rehab. Pt also have a big womb on her leg. Medicaid and womb doctor states a letter needs to be sent from Dr Nicki Reaper to get extra rehab. Letter can be sent to [facility] and cc [facility] of [place] or Son [email or web address] Thank You!

## 2014-10-31 ENCOUNTER — Telehealth: Payer: Self-pay | Admitting: Internal Medicine

## 2014-10-31 NOTE — Progress Notes (Signed)
Kara, [name] ([id number]) Visit Report for 10/30/2014 Arrival Information Details Patient Name: [name]. Date of Service: 10/30/2014 10:15 AM Medical Record Number: [id number] Patient Account Number: [id number] Date of Birth/Sex: 01/20/1923 (81 y.o. Female) Treating RN: Montey Hora Primary Care Physician: [name] Other Clinician: Referring Physician: Einar [name] Treating Physician/Extender: [name] in Treatment: 7 Visit Information History Since Last Visit Added or deleted any medications: No Patient Arrived: Wheel Chair Any new allergies or adverse reactions: No Arrival Time: 11:31 Had a fall or experienced change in No Accompanied By: staff activities of daily [name] that may affect Transfer Assistance: Manual risk of falls: Patient Identification Verified: Yes Signs or symptoms of abuse/neglect since last No Secondary Verification Process Yes visito Completed: Hospitalized since last visit: No Patient Requires Transmission- No Pain Present Now: No Based Precautions: Patient Has Alerts: Yes Patient Alerts: Patient on Blood Thinner Eliquis. R ABI:0.96 Electronic Signature(s) Signed: 10/30/2014 4:50:36 PM By: Montey Hora Entered By: Montey Hora on 10/30/2014 11:32:05 [name] ([id number]) -------------------------------------------------------------------------------- Encounter Discharge Information Details Patient Name: [name]. Date of Service: 10/30/2014 10:15 AM Medical Record Number: [id number] Patient Account Number: [id number] Date of Birth/Sex: 11-Jun-1922 (81 y.o. Female) Treating RN: Montey Hora Primary Care Physician: [name] Other Clinician: Referring Physician: Einar [name] Treating Physician/Extender: [name] in Treatment: 7 Encounter Discharge Information Items Discharge Pain Level: 0 Discharge Condition: Stable Ambulatory Status: Wheelchair Discharge Destination:  Nursing Home Transportation: Private Auto Accompanied By: staff Schedule Follow-up Appointment: Yes Medication Reconciliation completed and provided to Patient/Care No [name]: Provided on Clinical Summary of Care: 10/30/2014 Form Type Recipient Paper Patient LS Electronic Signature(s) Signed: 10/30/2014 4:50:36 PM By: Montey Hora Previous Signature: 10/30/2014 12:08:44 PM Version By: Ruthine Dose Entered By: Montey Hora on 10/30/2014 12:09:39 [name] ([id number]) -------------------------------------------------------------------------------- Lower Extremity Assessment Details Patient Name: [name]. Date of Service: 10/30/2014 10:15 AM Medical Record Number: [id number] Patient Account Number: [id number] Date of Birth/Sex: 29-Apr-1922 (81 y.o. Female) Treating RN: Montey Hora Primary Care Physician: [name] Other Clinician: Referring Physician: Einar [name] Treating Physician/Extender: [name] in Treatment: 7 Edema Assessment Assessed: [Left: No] [Right: No] E[Left: dema] [Right: :] Calf Left: Right: Point of Measurement: 37 cm From Medial Instep 38.3 cm cm Ankle Left: Right: Point of Measurement: 12 cm From Medial Instep 20 cm cm Vascular Assessment Pulses: Posterior Tibial Palpable: [Left:Yes] Dorsalis Pedis Palpable: [Left:Yes] Extremity colors, hair growth, and conditions: Extremity Color: [Left:Hyperpigmented] Hair Growth on Extremity: [Left:No] Temperature of Extremity: [Left:Warm] Capillary Refill: [Left:< 3 seconds] Toe Nail Assessment Left: Right: Thick: Yes Discolored: Yes Deformed: Yes Improper Length and Hygiene: Yes Electronic Signature(s) Signed: 10/30/2014 4:50:36 PM By: Montey Hora Entered By: Montey Hora on 10/30/2014 11:38:39 [name] ([id number] [name], [name] ([id number]) -------------------------------------------------------------------------------- Multi Wound Chart  Details Patient Name: [name]. Date of Service: 10/30/2014 10:15 AM Medical Record Number: [id number] Patient Account Number: [id number] Date of Birth/Sex: [birth year] (81 y.o. Female) Treating RN: Montey Hora Primary Care Physician: [name] Other Clinician: Referring Physician: Einar [name] Treating Physician/Extender: [name] in Treatment: 7 Vital Signs Height(in): 67 Pulse(bpm): 81 Weight(lbs): 159 Blood Pressure 161/54 (mmHg): Body Mass Index(BMI): 25 Temperature(F): 98.7 Respiratory Rate 20 (breaths/min): Photos: [1:No Photos] [N/A:N/A] Wound Location: [1:Left Lower Leg - Lateral N/A] Wounding Event: [1:Gradually Appeared] [N/A:N/A] Primary Etiology: [1:Arterial Insufficiency Ulcer N/A] Secondary Etiology: [1:Cellulitis] [N/A:N/A] Comorbid History: [1:Cataracts, Arrhythmia, Hypertension, Peripheral Arterial Disease, Peripheral Venous Disease, Osteoarthritis, Confinement Anxiety] [N/A:N/A] Date Acquired: [1:08/17/2014] [N/A:N/A]  Weeks of Treatment: [1:7] [N/A:N/A] Wound Status: [1:Open] [N/A:N/A] Measurements L x W x D 5.7x1.9x0.2 [N/A:N/A] (cm) Area (cm) : [1:8.506] [N/A:N/A] Volume (cm) : [1:1.701] [N/A:N/A] % Reduction in Area: [1:67.80%] [N/A:N/A] % Reduction in Volume: 67.80% [N/A:N/A] Classification: [1:Full Thickness Without Exposed Support Structures] [N/A:N/A] Exudate Amount: [1:Medium] [N/A:N/A] Exudate Type: [1:Serosanguineous] [N/A:N/A] Exudate Color: [1:red, brown] [N/A:N/A] Wound Margin: [1:Distinct, outline attached N/A] Granulation Amount: [1:Large (67-100%)] [N/A:N/A] Granulation Quality: [1:Red, Pink, Hyper- granulation] [N/A:N/A] Necrotic Amount: Small (1-33%) N/A N/A Exposed Structures: Fascia: No N/A N/A Fat: No Tendon: No Muscle: No Joint: No Bone: No Limited to Skin Breakdown Epithelialization: Medium (34-66%) N/A N/A Periwound Skin Texture: Edema: No N/A N/A Excoriation: No Induration: No Callus:  No Crepitus: No Fluctuance: No Friable: No Rash: No Scarring: No Periwound Skin Moist: Yes N/A N/A Moisture: Maceration: No Dry/Scaly: No Periwound Skin Color: Mottled: Yes N/A N/A Atrophie Blanche: No Cyanosis: No Ecchymosis: No Erythema: No Hemosiderin Staining: No Pallor: No Rubor: No Temperature: No Abnormality N/A N/A Tenderness on Yes N/A N/A Palpation: Wound Preparation: Ulcer Cleansing: N/A N/A Rinsed/Irrigated with Saline Topical Anesthetic Applied: Other: lidocaine 4% Treatment Notes Electronic Signature(s) Signed: 10/30/2014 4:50:36 PM By: Montey Hora Entered By: Montey Hora on 10/30/2014 11:52:26 [name] ([id number]) -------------------------------------------------------------------------------- [place] Details Patient Name: [name] Date of Service: 10/30/2014 10:15 AM Medical Record Number: [id number] Patient Account Number: [id number] Date of Birth/Sex: 18-Jun-1922 (81 y.o. Female) Treating RN: Montey Hora Primary Care Physician: [name] Other Clinician: Referring Physician: Einar [name] Treating Physician/Extender: [name] in Treatment: 7 Active Inactive Abuse / Safety / Falls / Self Care Management Nursing Diagnoses: Potential for falls Goals: Patient will remain injury free Date Initiated: 09/07/2014 Goal Status: Active Interventions: Assess fall risk on admission and as needed Notes: Nutrition Nursing Diagnoses: Potential for alteratiion in Nutrition/Potential for imbalanced nutrition Goals: Patient/caregiver agrees to and verbalizes understanding of need to use nutritional supplements and/or vitamins as prescribed Date Initiated: 09/07/2014 Goal Status: Active Interventions: Assess patient nutrition upon admission and as needed per policy Notes: Orientation to the Wound Care Program Nursing Diagnoses: Knowledge deficit related to the wound healing center  program Goals: Patient/caregiver will verbalize understanding of the [place] ([id number]) Date Initiated: 09/07/2014 Goal Status: Active Interventions: Provide education on orientation to the wound center Notes: Wound/Skin Impairment Nursing Diagnoses: Impaired tissue integrity Goals: Patient/caregiver will verbalize understanding of skin care regimen Date Initiated: 09/07/2014 Goal Status: Active Ulcer/skin breakdown will heal within 14 weeks Date Initiated: 09/07/2014 Goal Status: Active Interventions: Assess patient/caregiver ability to obtain necessary supplies Assess ulceration(s) every visit Notes: Electronic Signature(s) Signed: 10/30/2014 4:50:36 PM By: Montey Hora Entered By: Montey Hora on 10/30/2014 11:50:47 [name] ([id number]) -------------------------------------------------------------------------------- Patient/Caregiver Education Details Patient Name: [name] Date of Service: 10/30/2014 10:15 AM Medical Record Number: [id number] Patient Account Number: [id number] Date of Birth/Gender: 05-12-1922 (81 y.o. Female) Treating RN: Montey Hora Primary Care Physician: [name] Other Clinician: Referring Physician: Einar [name] Treating Physician/Extender: [name] in Treatment: 7 Education Assessment Education Provided To: Patient and Caregiver Education Topics Provided Venous: Handouts: Controlling Swelling with Multilayered Compression Wraps Methods: Printed Responses: State content correctly Electronic Signature(s) Signed: 10/30/2014 4:50:36 PM By: Montey Hora Entered By: Montey Hora on 10/30/2014 12:09:52 [name] ([id number]) -------------------------------------------------------------------------------- Wound Assessment Details Patient Name: [name]. Date of Service: 10/30/2014 10:15 AM Medical Record Number: [id number] Patient Account Number:  [id number] Date of Birth/Sex: August 15, 1922 (81 y.o. Female)  Treating RN: Montey Hora Primary Care Physician: [name] Other Clinician: Referring Physician: Einar [name] Treating Physician/Extender: [name] in Treatment: 7 Wound Status Wound Number: 1 Primary Arterial Insufficiency Ulcer Etiology: Wound Location: Left Lower Leg - Lateral Secondary Cellulitis Wounding Event: Gradually Appeared Etiology: Date Acquired: 08/17/2014 Wound Open Weeks Of Treatment: 7 Status: Clustered Wound: No Comorbid Cataracts, Arrhythmia, Hypertension, History: Peripheral Arterial Disease, Peripheral Venous Disease, Osteoarthritis, Confinement Anxiety Photos Photo Uploaded By: Montey Hora on 10/30/2014 16:47:16 Wound Measurements Length: (cm) 5.7 Width: (cm) 1.9 Depth: (cm) 0.2 Area: (cm) 8.506 Volume: (cm) 1.701 % Reduction in Area: 67.8% % Reduction in Volume: 67.8% Epithelialization: Medium (34-66%) Tunneling: No Undermining: No Wound Description Full Thickness Without Exposed Classification: Support Structures Wound Margin: Distinct, outline attached Exudate Medium Amount: Exudate Type: Serosanguineous Exudate Color: red, brown [name], Kara L. ([id number]) Foul Odor After Cleansing: No Wound Bed Granulation Amount: Large (67-100%) Exposed Structure Granulation Quality: Red, Pink, Hyper-granulation Fascia Exposed: No Necrotic Amount: Small (1-33%) Fat Layer Exposed: No Necrotic Quality: Adherent Slough Tendon Exposed: No Muscle Exposed: No Joint Exposed: No Bone Exposed: No Limited to Skin Breakdown Periwound Skin Texture Texture Color No Abnormalities Noted: No No Abnormalities Noted: No Callus: No Atrophie Blanche: No Crepitus: No Cyanosis: No Excoriation: No Ecchymosis: No Fluctuance: No Erythema: No Friable: No Hemosiderin Staining: No Induration: No Mottled: Yes Localized Edema: No Pallor: No Rash: No Rubor: No Scarring:  No Temperature / Pain Moisture Temperature: No Abnormality No Abnormalities Noted: No Tenderness on Palpation: Yes Dry / Scaly: No Maceration: No Moist: Yes Wound Preparation Ulcer Cleansing: Rinsed/Irrigated with Saline Topical Anesthetic Applied: Other: lidocaine 4%, Treatment Notes Wound #1 (Left, Lateral Lower Leg) 1. Cleansed with: Cleanse wound with antibacterial soap and water 2. Anesthetic Topical Lidocaine 4% cream to wound bed prior to debridement 4. Dressing Applied: Prisma Ag 5. Secondary Dressing Applied ABD Pad 7. Secured with 2 Layer Lite Compression System - Left Lower Extremity Electronic Signature(s) Kara, [name] ([id number]) Signed: 10/30/2014 4:50:36 PM By: Montey Hora Entered By: Montey Hora on 10/30/2014 11:49:02 [name] ([id number]) -------------------------------------------------------------------------------- Vitals Details Patient Name: [name] Date of Service: 10/30/2014 10:15 AM Medical Record Number: [id number] Patient Account Number: [id number] Date of Birth/Sex: 09-02-1922 (81 y.o. Female) Treating RN: Montey Hora Primary Care Physician: [name] Other Clinician: Referring Physician: Einar [name] Treating Physician/Extender: [name] in Treatment: 7 Vital Signs Time Taken: 11:30 Temperature (F): 98.7 Height (in): 67 Pulse (bpm): 81 Weight (lbs): 159 Respiratory Rate (breaths/min): 20 Body Mass Index (BMI): 24.9 Blood Pressure (mmHg): 161/54 Reference Range: 80 - 120 mg / dl Electronic Signature(s) Signed: 10/30/2014 4:50:36 PM By: Montey Hora Entered By: Montey Hora on 10/30/2014 11:34:38

## 2014-10-31 NOTE — Telephone Encounter (Signed)
[name] from [facility] returned your call.  Please return call

## 2014-10-31 NOTE — Progress Notes (Signed)
[name], [name] ([id number]) Visit Report for 10/30/2014 Chief Complaint Document Details Patient Name: [name], [name] 10/30/2014 10:15 Date of Service: AM Medical Record [id number] Number: Patient Account Number: [id number] 1923/01/26 (81 y.o. Treating RN: [name] Date of Birth/Sex: Female) Other Clinician: Primary Care Physician: [name] Treating [name] Referring Physician: Einar [name] Physician/Extender: [name] in Treatment: 7 Information Obtained from: Patient Chief Complaint Patient presents to the wound care center today with an open arterial ulcer. The patient has had left lower extremity ulceration for about 1 month. Electronic Signature(s) Signed: 10/30/2014 12:00:25 PM By: [name] Entered By: [name] on 10/30/2014 12:00:25 [name] ([id number]) -------------------------------------------------------------------------------- HPI Details Patient Name: [name], [name] 10/30/2014 10:15 Date of Service: AM Medical Record [id number] Number: Patient Account Number: [id number] 1922-08-12 (81 y.o. Treating RN: [name] Date of Birth/Sex: Female) Other Clinician: Primary Care Physician: [name] Treating [name] Referring Physician: Einar [name] Physician/Extender: [name] in Treatment: 7 History of Present Illness Location: left lower extremity ulceration Quality: Patient reports experiencing burning to affected area(s). Severity: Patient states wound are getting worse. Duration: Patient has had the wound for < 4 [name] prior to presenting for treatment Timing: Pain in wound is constant (hurts all the time) Context: The wound appeared gradually over time Modifying Factors: Other treatment(s) tried include:has been on oral anti-buttocks since the beginning of the month and had 10 days of Augmentin and now is on doxycycline. Associated Signs and Symptoms: Patient reports having difficulty standing  for long periods. HPI Description: 81 year old patient was admitted to [facility] between 08/13/2014 and 08/17/2014 with cellulitis of the left lower extremity.duplex study done then showed no DVT and the patient was on IV vancomycin and Rocephin and also received Unasyn later. The patient was sent home on 10 days of Augmentin. Her other comorbidities of atrial fibrillation, essential hypertension, diabetes mellitus type 2, coronary artery disease were managed with appropriate medications. on 09/04/2014 she was seen in the ER and was found to have a 10 cm ulceration on the left lower extremity lateral and superior to the lateral malleolus. The granulation tissue was noted and she was referred to the wound center. the ER notes that her ABIs were within normal limits and she was sent home on consultative management. a duplex scan of the lower extremities was done by the nursing home and this showed that the right ABI was 0.96 with a multiphasic waveform throughout the right leg. There was a monophasic waveform of the left leg suggesting left external iliac artery stenosis with blunting distally and the ABI could not be calculated on the left side. Moderate to severe ischemia was suspected of the left leg and a MR angiogram was recommended. 09/17/2014 -- Xray Left tibia and fibula --- unremarkable tibia and fibula Her wound culture is back and this has grown a heavy growth of staph aureus sensitive to ciprofloxacin and clindamycin and Bactrim. She has been on doxycycline since July 14 for 10 days and we will review her clinically before switching her antibiotics if need be. After reviewing her chart she is not going to be here till 09/17/2014 and hence I will call in Bactrim DS 1 twice a day for 14 days. She has seen Dr. Lucky [name] for an opinion and she is scheduled a procedure on this coming Thursday. other than that the pain and swelling continues and she has no other acute  problems. 09/25/2014 -- she had a recent procedure done by Dr. Lucky [name]  for diffuse SFA stenosis, popliteal occlusion and tibial disease. she had at angioplasty of the left peroneal artery and tibioperoneal trunk, left superficial Maclaughlin, [name] femoral artery, left popliteal artery, distal SFA and popliteal artery. the patient is experiencing some swelling of the left lower extremity after the procedure. 10/09/2014 -- she has an appointment to see her vascular surgeon again this weekend for possibly a vascular Doppler. No other issues. 10/23/2014 -- The patient was recently seen by Dr. Lucky [name] on this past Friday. We do not have his reports but we do know that he was pleased with her progress and did not plan any further procedure in the near future. 10/30/2014 - we have received Dr. Bunnie [name] evaluation and he has mentioned that the duplex done showed a stenosis versus a lesion in the distal popliteal artery and tibioperoneal trunk in an area below her previous intervention and the overall flow was significantly better and the stent itself was patent. Her perfusion has improved but it was not optimized. Since her wound was improving he did not recommend any angiogram at this point. However if her wound worsens or infection develops he would recommend an angiogram and further evaluation to improve her perfusion. He is going to see her back in 6-8 [name]. Electronic Signature(s) Signed: 10/30/2014 12:04:14 PM By: [name] Entered By: [name] on 10/30/2014 12:04:14 [name] ([id number]) -------------------------------------------------------------------------------- Physical Exam Details Patient Name: [name] 10/30/2014 10:15 Date of Service: AM Medical Record [id number] Number: Patient Account Number: [id number] [birth year] (81 y.o. Treating RN: [name] Date of Birth/Sex: Female) Other Clinician: Primary Care Physician: [name]  Treating [name] Referring Physician: Einar [name] Physician/Extender: [name] in Treatment: 7 Constitutional . Pulse regular. Respirations normal and unlabored. Afebrile. . Eyes Nonicteric. Reactive to light. Ears, Nose, Mouth, and Throat Lips, teeth, and gums WNL.Marland Kitchen Moist mucosa without lesions . Neck supple and nontender. No palpable supraclavicular or cervical adenopathy. Normal sized without goiter. Respiratory WNL. No retractions.. Cardiovascular Pedal Pulses WNL. she still has significant edema of both lower extremities.. Chest Breasts symmetical and no nipple discharge.. Breast tissue WNL, no masses, lumps, or tenderness.. Lymphatic No adneopathy. No adenopathy. No adenopathy. Musculoskeletal Adexa without tenderness or enlargement.. Digits and nails w/o clubbing, cyanosis, infection, petechiae, ischemia, or inflammatory conditions.. Integumentary (Hair, Skin) No suspicious lesions. No crepitus or fluctuance. No peri-wound warmth or erythema. No masses.Marland Kitchen Psychiatric Judgement and insight Intact.. No evidence of depression, anxiety, or agitation.. Notes the wound itself looks clean and has good healthy granulation tissue and the edema is minimal because of the compression. Electronic Signature(s) Signed: 10/30/2014 12:04:50 PM By: [name] Entered By: [name] on 10/30/2014 12:04:48 [name] ([id number]) -------------------------------------------------------------------------------- Physician Orders Details Patient Name: [name], [name] 10/30/2014 10:15 Date of Service: AM Medical Record [id number] Number: Patient Account Number: [id number] 1922/12/29 (81 y.o. Treating RN: [name] Date of Birth/Sex: Female) Other Clinician: Primary Care Physician: [name] Treating [name] Referring Physician: Einar [name] Physician/Extender: [name] in Treatment: 7 Verbal / Phone Orders: Yes Clinician: Montey [name] Read Back and Verified: Yes Diagnosis Coding Wound Cleansing Wound #1 Left,Lateral Lower Leg o May Shower, gently pat wound dry prior to applying new dressing. o May shower with protection. o No tub bath. Skin Barriers/Peri-Wound Care Wound #1 Left,Lateral Lower Leg o Barrier cream Primary Wound Dressing Wound #1 Left,Lateral Lower Leg o Prisma Ag Secondary Dressing Wound #1 Left,Lateral Lower Leg o ABD pad o Drawtex Dressing Change  Frequency Wound #1 Left,Lateral Lower Leg o Change dressing every week Follow-up Appointments Wound #1 Left,Lateral Lower Leg o Return Appointment in 1 week. Edema Control Wound #1 Left,Lateral Lower Leg o 2 Layer Lite Compression System - Left Lower Extremity - If toes turn blue or patient has pain, remove wrap immediately. [name], [name] ([id number]) Electronic Signature(s) Signed: 10/30/2014 4:28:23 PM By: [name] Signed: 10/30/2014 4:50:36 PM By: [name] Entered By: [name] on 10/30/2014 11:52:49 [name] ([id number]) -------------------------------------------------------------------------------- Problem List Details Patient Name: [name], [name] 10/30/2014 10:15 Date of Service: AM Medical Record [id number] Number: Patient Account Number: [id number] 02-18-1922 (81 y.o. Treating RN: [name] Date of Birth/Sex: Female) Other Clinician: Primary Care Physician: [name] Treating Ahsha Hinsley Referring Physician: Einar [name] Physician/Extender: [name] in Treatment: 7 Active Problems ICD-10 Encounter Code Description Active Date Diagnosis I70.248 Atherosclerosis of native arteries of left leg with ulceration 09/07/2014 Yes of other part of lower left leg L97.222 Non-pressure chronic ulcer of left calf with fat layer 09/07/2014 Yes exposed L03.116 Cellulitis of left lower limb 09/07/2014 Yes Inactive Problems Resolved Problems Electronic  Signature(s) Signed: 10/30/2014 12:00:15 PM By: [name] Entered By: [name] on 10/30/2014 12:00:15 [name] ([id number]) -------------------------------------------------------------------------------- Progress Note Details Patient Name: [name], [name] 10/30/2014 10:15 Date of Service: AM Medical Record [id number] Number: Patient Account Number: [id number] 10-21-1922 (81 y.o. Treating RN: [name] Date of Birth/Sex: Female) Other Clinician: Primary Care Physician: [name] Treating [name] Referring Physician: Einar [name] Physician/Extender: [name] in Treatment: 7 Subjective Chief Complaint Information obtained from Patient Patient presents to the wound care center today with an open arterial ulcer. The patient has had left lower extremity ulceration for about 1 month. History of Present Illness (HPI) The following HPI elements were documented for the patient's wound: Location: left lower extremity ulceration Quality: Patient reports experiencing burning to affected area(s). Severity: Patient states wound are getting worse. Duration: Patient has had the wound for < 4 [name] prior to presenting for treatment Timing: Pain in wound is constant (hurts all the time) Context: The wound appeared gradually over time Modifying Factors: Other treatment(s) tried include:has been on oral anti-buttocks since the beginning of the month and had 10 days of Augmentin and now is on doxycycline. Associated Signs and Symptoms: Patient reports having difficulty standing for long periods. 81 year old patient was admitted to [facility] between 08/13/2014 and 08/17/2014 with cellulitis of the left lower extremity.duplex study done then showed no DVT and the patient was on IV vancomycin and Rocephin and also received Unasyn later. The patient was sent home on 10 days of Augmentin. Her other comorbidities of atrial fibrillation,  essential hypertension, diabetes mellitus type 2, coronary artery disease were managed with appropriate medications. on 09/04/2014 she was seen in the ER and was found to have a 10 cm ulceration on the left lower extremity lateral and superior to the lateral malleolus. The granulation tissue was noted and she was referred to the wound center. the ER notes that her ABIs were within normal limits and she was sent home on consultative management. a duplex scan of the lower extremities was done by the nursing home and this showed that the right ABI was 0.96 with a multiphasic waveform throughout the right leg. There was a monophasic waveform of the left leg suggesting left external iliac artery stenosis with blunting distally and the ABI could not be calculated on the left side. Moderate to severe ischemia  was suspected of the left leg and a MR angiogram was recommended. 09/17/2014 -- Xray Left tibia and fibula --- unremarkable tibia and fibula Her wound culture is back and this has grown a heavy growth of staph aureus sensitive to ciprofloxacin and clindamycin and Bactrim. [name], [name] ([id number]) She has been on doxycycline since July 14 for 10 days and we will review her clinically before switching her antibiotics if need be. After reviewing her chart she is not going to be here till 09/17/2014 and hence I will call in Bactrim DS 1 twice a day for 14 days. She has seen Dr. Lucky [name] for an opinion and she is scheduled a procedure on this coming Thursday. other than that the pain and swelling continues and she has no other acute problems. 09/25/2014 -- she had a recent procedure done by Dr. Lucky [name] for diffuse SFA stenosis, popliteal occlusion and tibial disease. she had at angioplasty of the left peroneal artery and tibioperoneal trunk, left superficial femoral artery, left popliteal artery, distal SFA and popliteal artery. the patient is experiencing some swelling of the left lower extremity after  the procedure. 10/09/2014 -- she has an appointment to see her vascular surgeon again this weekend for possibly a vascular Doppler. No other issues. 10/23/2014 -- The patient was recently seen by Dr. Lucky [name] on this past Friday. We do not have his reports but we do know that he was pleased with her progress and did not plan any further procedure in the near future. 10/30/2014 - we have received Dr. Bunnie [name] evaluation and he has mentioned that the duplex done showed a stenosis versus a lesion in the distal popliteal artery and tibioperoneal trunk in an area below her previous intervention and the overall flow was significantly better and the stent itself was patent. Her perfusion has improved but it was not optimized. Since her wound was improving he did not recommend any angiogram at this point. However if her wound worsens or infection develops he would recommend an angiogram and further evaluation to improve her perfusion. He is going to see her back in 6-8 [name]. Objective Constitutional Pulse regular. Respirations normal and unlabored. Afebrile. Vitals Time Taken: 11:30 AM, Height: 67 in, Weight: 159 lbs, BMI: 24.9, Temperature: 98.7 F, Pulse: 81 bpm, Respiratory Rate: 20 breaths/min, Blood Pressure: 161/54 mmHg. Eyes Nonicteric. Reactive to light. Ears, Nose, Mouth, and Throat Lips, teeth, and gums WNL.Marland Kitchen Moist mucosa without lesions . Neck supple and nontender. No palpable supraclavicular or cervical adenopathy. Normal sized without goiter. Respiratory WNL. No retractions.Marland Kitchen [name], [name] ([id number]) Cardiovascular Pedal Pulses WNL. she still has significant edema of both lower extremities.. Chest Breasts symmetical and no nipple discharge.. Breast tissue WNL, no masses, lumps, or tenderness.. Lymphatic No adneopathy. No adenopathy. No adenopathy. Musculoskeletal Adexa without tenderness or enlargement.. Digits and nails w/o clubbing, cyanosis, infection, petechiae, ischemia, or  inflammatory conditions.Marland Kitchen Psychiatric Judgement and insight Intact.. No evidence of depression, anxiety, or agitation.. General Notes: the wound itself looks clean and has good healthy granulation tissue and the edema is minimal because of the compression. Integumentary (Hair, Skin) No suspicious lesions. No crepitus or fluctuance. No peri-wound warmth or erythema. No masses.. Wound #1 status is Open. Original cause of wound was Gradually Appeared. The wound is located on the Left,Lateral Lower Leg. The wound measures 5.7cm length x 1.9cm width x 0.2cm depth; 8.506cm^2 area and 1.701cm^3 volume. The wound is limited to skin breakdown. There is no tunneling or undermining noted. There is a medium amount  of serosanguineous drainage noted. The wound margin is distinct with the outline attached to the wound base. There is large (67-100%) red, pink granulation within the wound bed. There is a small (1-33%) amount of necrotic tissue within the wound bed including Adherent Slough. The periwound skin appearance exhibited: Moist, Mottled. The periwound skin appearance did not exhibit: Callus, Crepitus, Excoriation, Fluctuance, Friable, Induration, Localized Edema, Rash, Scarring, Dry/Scaly, Maceration, Atrophie Blanche, Cyanosis, Ecchymosis, Hemosiderin Staining, Pallor, Rubor, Erythema. Periwound temperature was noted as No Abnormality. The periwound has tenderness on palpation. Assessment Active Problems ICD-10 I70.248 - Atherosclerosis of native arteries of left leg with ulceration of other part of lower left leg L97.222 - Non-pressure chronic ulcer of left calf with fat layer exposed L03.116 - Cellulitis of left lower limb [name], Kara L. ([id number]) I have recommended we continue with Prisma AG and a 2 layer wrap. At this stage I would recommend a skin substitute and we will see if she is eligibile Grafix or Dermagraft. She will come and see me back next week. Plan Wound Cleansing: Wound  #1 Left,Lateral Lower Leg: May Shower, gently pat wound dry prior to applying new dressing. May shower with protection. No tub bath. Skin Barriers/Peri-Wound Care: Wound #1 Left,Lateral Lower Leg: Barrier cream Primary Wound Dressing: Wound #1 Left,Lateral Lower Leg: Prisma Ag Secondary Dressing: Wound #1 Left,Lateral Lower Leg: ABD pad Drawtex Dressing Change Frequency: Wound #1 Left,Lateral Lower Leg: Change dressing every week Follow-up Appointments: Wound #1 Left,Lateral Lower Leg: Return Appointment in 1 week. Edema Control: Wound #1 Left,Lateral Lower Leg: 2 Layer Lite Compression System - Left Lower Extremity - If toes turn blue or patient has pain, remove wrap immediately. I have recommended we continue with Prisma AG and a 2 layer wrap. At this stage I would recommend a skin substitute and we will see if she is eligibile Grafix or Dermagraft. She will come and see me back next week. Electronic Signature(s) Signed: 10/30/2014 12:06:05 PM By: [name] [name], [name] ([id number]) Entered By: [name] on 10/30/2014 12:06:05 [name] ([id number]) -------------------------------------------------------------------------------- SuperBill Details Patient Name: [name] Date of Service: 10/30/2014 Medical Record Number: [id number] Patient Account Number: [id number] Date of Birth/Sex: Nov 16, 1922 (81 y.o. Female) Treating RN: [name] Primary Care Physician: [name] Other Clinician: Referring Physician: Einar [name] Treating Physician/Extender: Frann Rider in Treatment: 7 Diagnosis Coding ICD-10 Codes Code Description [id number] Atherosclerosis of native arteries of left leg with ulceration of other part of lower left leg L97.222 Non-pressure chronic ulcer of left calf with fat layer exposed L03.116 Cellulitis of left lower limb Facility Procedures CPT4: Description Modifier Quantity Code [id number] (Facility Use  Only) [id number] - [facility] COMPRS LWR LT 1 LEG Physician Procedures CPT4: Description Modifier Quantity Code [id number] 99213 - WC PHYS LEVEL 3 - EST PT 1 ICD-10 Description Diagnosis I70.248 Atherosclerosis of native arteries of left leg with ulceration of other part of lower left leg L97.222 Non-pressure chronic ulcer of  left calf with fat layer exposed L03.116 Cellulitis of left lower limb Electronic Signature(s) Signed: 10/30/2014 4:28:23 PM By: [name] Signed: 10/30/2014 4:50:36 PM By: [name] Previous Signature: 10/30/2014 02:63:78 PM Version By: [name] Entered By: [name] on 10/30/2014 12:08:55

## 2014-10-31 NOTE — Telephone Encounter (Signed)
Called and discussed [name]'s care with the administrator.  Physical therapy to call me back.

## 2014-10-31 NOTE — Telephone Encounter (Signed)
Called and spoke to pts son.  Explained my previous discussion with [place] and Building services engineer at [facility]. I also called and spoke to the administrator at [facility].  He explained to me that the patient (per therapy) was able to transfer and walk with therapist standing beside her.  She is able to go to the bathroom with someone watching.  No support needed.  She no longer meets criteria.  Discussed with him regarding a letter from me.  States she does not meet medicare criteria for coverage.  It has been discussed with them regarding her staying longer (with self pay or medicaid).  I called pt back and left word regarding all of the above.  When I first spoke to him, I also explained that Ms Bruening's daughter and granddaughter had expressed their desire to see another physician.  See previous phone message for details.  I had discussed the need to find a new physician prior to being discharged for continued care.

## 2014-11-02 ENCOUNTER — Ambulatory Visit: Payer: Medicare Other | Admitting: Internal Medicine

## 2014-11-06 ENCOUNTER — Encounter: Payer: Medicare Other | Admitting: Surgery

## 2014-11-06 DIAGNOSIS — L97222 Non-pressure chronic ulcer of left calf with fat layer exposed: Secondary | ICD-10-CM | POA: Diagnosis not present

## 2014-11-06 NOTE — Progress Notes (Signed)
Kara, [name] ([id number]) Visit Report for 11/06/2014 Arrival Information Details Patient Name: [name], [name]. Date of Service: 11/06/2014 10:45 AM Medical Record Number: [id number] Patient Account Number: [id number] Date of Birth/Sex: January 28, 1923 (81 y.o. Female) Treating RN: Afful, RN, BSN, Velva Harman Primary Care Physician: Einar Pheasant Other Clinician: Referring Physician: Einar Pheasant Treating Physician/Extender: Frann Rider in Treatment: 8 Visit Information History Since Last Visit Any new allergies or adverse reactions: No Patient Arrived: Wheel Chair Had a fall or experienced change in No Arrival Time: 10:38 activities of daily [name] that may affect Accompanied By: dtr risk of falls: Transfer Assistance: Manual Signs or symptoms of abuse/neglect since last No Patient Identification Verified: Yes visito Secondary Verification Process Yes Hospitalized since last visit: No Completed: Has Dressing in Place as Prescribed: Yes Patient Requires Transmission- No Has Compression in Place as Prescribed: Yes Based Precautions: Pain Present Now: No Patient Has Alerts: Yes Patient Alerts: Patient on Blood Thinner Eliquis. R ABI:0.96 Electronic Signature(s) Signed: 11/06/2014 10:39:02 AM By: Regan Lemming BSN, RN Entered By: Regan Lemming on 11/06/2014 10:39:02 [name] ([id number]) -------------------------------------------------------------------------------- Encounter Discharge Information Details Patient Name: [name] Date of Service: 11/06/2014 10:45 AM Medical Record Number: [id number] Patient Account Number: [id number] Date of Birth/Sex: 1922-10-31 (81 y.o. Female) Treating RN: Baruch Gouty, RN, BSN, Velva Harman Primary Care Physician: Einar Pheasant Other Clinician: Referring Physician: Einar Pheasant Treating Physician/Extender: Frann Rider in Treatment: 8 Encounter Discharge Information Items Schedule Follow-up Appointment: No Medication  Reconciliation completed No and provided to Patient/Care Provider: Provided on Clinical Summary of Care: 11/06/2014 Form Type Recipient Paper Patient LS Electronic Signature(s) Signed: 11/06/2014 11:16:55 AM By: Ruthine Dose Entered By: Ruthine Dose on 11/06/2014 11:16:55 [name] ([id number]) -------------------------------------------------------------------------------- Lower Extremity Assessment Details Patient Name: [name]. Date of Service: 11/06/2014 10:45 AM Medical Record Number: [id number] Patient Account Number: [id number] Date of Birth/Sex: 03-11-1922 (81 y.o. Female) Treating RN: Afful, RN, BSN, Velva Harman Primary Care Physician: Einar Pheasant Other Clinician: Referring Physician: Einar Pheasant Treating Physician/Extender: Frann Rider in Treatment: 8 Edema Assessment Assessed: [Left: No] [Right: No] E[Left: dema] [Right: :] Calf Left: Right: Point of Measurement: 37 cm From Medial Instep 37.5 cm cm Ankle Left: Right: Point of Measurement: 15 cm From Medial Instep 19.5 cm cm Vascular Assessment Pulses: Posterior Tibial Dorsalis Pedis Palpable: [Left:Yes] Extremity colors, hair growth, and conditions: Extremity Color: [Left:Mottled] Hair Growth on Extremity: [Left:No] Temperature of Extremity: [Left:Warm] Capillary Refill: [Left:< 3 seconds] Toe Nail Assessment Left: Right: Thick: Yes Discolored: Yes Deformed: Yes Improper Length and Hygiene: Yes Electronic Signature(s) Signed: 11/06/2014 10:47:00 AM By: Regan Lemming BSN, RN Entered By: Regan Lemming on 11/06/2014 10:47:00 [name] ([id number]) -------------------------------------------------------------------------------- Multi Wound Chart Details Patient Name: [name]. Date of Service: 11/06/2014 10:45 AM Medical Record Number: [id number] Patient Account Number: [id number] Date of Birth/Sex: 1922/06/01 (81 y.o. Female) Treating RN: Baruch Gouty, RN, BSN, Velva Harman Primary  Care Physician: Einar Pheasant Other Clinician: Referring Physician: Einar Pheasant Treating Physician/Extender: Frann Rider in Treatment: 8 Vital Signs Height(in): 67 Pulse(bpm): 76 Weight(lbs): 159 Blood Pressure 136/71 (mmHg): Body Mass Index(BMI): 25 Temperature(F): 98.2 Respiratory Rate 18 (breaths/min): Photos: [1:No Photos] [N/A:N/A] Wound Location: [1:Left Lower Leg - Lateral N/A] Wounding Event: [1:Gradually Appeared] [N/A:N/A] Primary Etiology: [1:Arterial Insufficiency Ulcer N/A] Secondary Etiology: [1:Cellulitis] [N/A:N/A] Comorbid History: [1:Cataracts, Arrhythmia, Hypertension, Peripheral Arterial Disease, Peripheral Venous Disease, Osteoarthritis, Confinement Anxiety] [N/A:N/A] Date Acquired: [1:08/17/2014] [N/A:N/A] Weeks of Treatment: [1:8] [N/A:N/A] Wound Status: [1:Open] [N/A:N/A] Measurements L x W x D  5.5x1.2x0.1 [N/A:N/A] (cm) Area (cm) : [1:5.184] [N/A:N/A] Volume (cm) : [1:0.518] [N/A:N/A] % Reduction in Area: [1:80.40%] [N/A:N/A] % Reduction in Volume: 90.20% [N/A:N/A] Classification: [1:Full Thickness Without Exposed Support Structures] [N/A:N/A] Exudate Amount: [1:Medium] [N/A:N/A] Exudate Type: [1:Serosanguineous] [N/A:N/A] Exudate Color: [1:red, brown] [N/A:N/A] Wound Margin: [1:Distinct, outline attached N/A] Granulation Amount: [1:Large (67-100%)] [N/A:N/A] Granulation Quality: [1:Red, Pink, Hyper- granulation] [N/A:N/A] Necrotic Amount: Small (1-33%) N/A N/A Exposed Structures: Fascia: No N/A N/A Fat: No Tendon: No Muscle: No Joint: No Bone: No Limited to Skin Breakdown Epithelialization: Medium (34-66%) N/A N/A Periwound Skin Texture: Edema: No N/A N/A Excoriation: No Induration: No Callus: No Crepitus: No Fluctuance: No Friable: No Rash: No Scarring: No Periwound Skin Moist: Yes N/A N/A Moisture: Maceration: No Dry/Scaly: No Periwound Skin Color: Mottled: Yes N/A N/A Atrophie Blanche: No Cyanosis:  No Ecchymosis: No Erythema: No Hemosiderin Staining: No Pallor: No Rubor: No Temperature: No Abnormality N/A N/A Tenderness on Yes N/A N/A Palpation: Wound Preparation: Ulcer Cleansing: N/A N/A Rinsed/Irrigated with Saline Topical Anesthetic Applied: Other: lidocaine 4% Treatment Notes Electronic Signature(s) Signed: 11/06/2014 11:04:26 AM By: Regan Lemming BSN, RN Entered By: Regan Lemming on 11/06/2014 11:04:26 [name] ([id number]) -------------------------------------------------------------------------------- [place] Details Patient Name: [name] Date of Service: 11/06/2014 10:45 AM Medical Record Number: [id number] Patient Account Number: [id number] Date of Birth/Sex: 1922/09/18 (81 y.o. Female) Treating RN: Afful, RN, BSN, Velva Harman Primary Care Physician: Einar Pheasant Other Clinician: Referring Physician: Einar Pheasant Treating Physician/Extender: Frann Rider in Treatment: 8 Active Inactive Abuse / Safety / Falls / Self Care Management Nursing Diagnoses: Potential for falls Goals: Patient will remain injury free Date Initiated: 09/07/2014 Goal Status: Active Interventions: Assess fall risk on admission and as needed Notes: Nutrition Nursing Diagnoses: Potential for alteratiion in Nutrition/Potential for imbalanced nutrition Goals: Patient/caregiver agrees to and verbalizes understanding of need to use nutritional supplements and/or vitamins as prescribed Date Initiated: 09/07/2014 Goal Status: Active Interventions: Assess patient nutrition upon admission and as needed per policy Notes: Orientation to the Wound Care Program Nursing Diagnoses: Knowledge deficit related to the wound healing center program Goals: Patient/caregiver will verbalize understanding of the [place] [place] ([id number]) Date Initiated: 09/07/2014 Goal Status: Active Interventions: Provide education on  orientation to the wound center Notes: Wound/Skin Impairment Nursing Diagnoses: Impaired tissue integrity Goals: Patient/caregiver will verbalize understanding of skin care regimen Date Initiated: 09/07/2014 Goal Status: Active Ulcer/skin breakdown will heal within 14 weeks Date Initiated: 09/07/2014 Goal Status: Active Interventions: Assess patient/caregiver ability to obtain necessary supplies Assess ulceration(s) every visit Notes: Electronic Signature(s) Signed: 11/06/2014 11:01:18 AM By: Regan Lemming BSN, RN Entered By: Regan Lemming on 11/06/2014 11:01:17 [name] ([id number]) -------------------------------------------------------------------------------- Pain Assessment Details Patient Name: [name]. Date of Service: 11/06/2014 10:45 AM Medical Record Number: [id number] Patient Account Number: [id number] Date of Birth/Sex: [birth year] (81 y.o. Female) Treating RN: Baruch Gouty, RN, BSN, Velva Harman Primary Care Physician: Einar Pheasant Other Clinician: Referring Physician: Einar Pheasant Treating Physician/Extender: Frann Rider in Treatment: 8 Active Problems Location of Pain Severity and Description of Pain Patient Has Paino No Site Locations Pain Management and Medication Current Pain Management: Electronic Signature(s) Signed: 11/06/2014 10:40:04 AM By: Regan Lemming BSN, RN Entered By: Regan Lemming on 11/06/2014 10:40:04 [name] ([id number]) -------------------------------------------------------------------------------- Patient/Caregiver Education Details Patient Name: [name] Date of Service: 11/06/2014 10:45 AM Medical Record Number: [id number] Patient Account Number: [id number] Date of Birth/Gender: Nov 25, 1922 (81 y.o. Female) Treating RN: Afful, RN, BSN, [facility]  Physician: Einar Pheasant Other Clinician: Referring Physician: Einar Pheasant Treating Physician/Extender: Frann Rider in Treatment: 8 Education  Assessment Education Provided To: Patient and Caregiver Education Topics Provided Welcome To [facility]: Methods: Explain/Verbal Responses: State content correctly Electronic Signature(s) Signed: 11/06/2014 11:17:24 AM By: Regan Lemming BSN, RN Entered By: Regan Lemming on 11/06/2014 11:17:24 [name] ([id number]) -------------------------------------------------------------------------------- Wound Assessment Details Patient Name: [name]. Date of Service: 11/06/2014 10:45 AM Medical Record Number: [id number] Patient Account Number: [id number] Date of Birth/Sex: Mar 27, 1922 (81 y.o. Female) Treating RN: Afful, RN, BSN, Velva Harman Primary Care Physician: Einar Pheasant Other Clinician: Referring Physician: Einar Pheasant Treating Physician/Extender: Frann Rider in Treatment: 8 Wound Status Wound Number: 1 Primary Arterial Insufficiency Ulcer Etiology: Wound Location: Left Lower Leg - Lateral Secondary Cellulitis Wounding Event: Gradually Appeared Etiology: Date Acquired: 08/17/2014 Wound Open Weeks Of Treatment: 8 Status: Clustered Wound: No Comorbid Cataracts, Arrhythmia, Hypertension, History: Peripheral Arterial Disease, Peripheral Venous Disease, Osteoarthritis, Confinement Anxiety Wound Measurements Length: (cm) 5.5 Width: (cm) 1.2 Depth: (cm) 0.1 Area: (cm) 5.184 Volume: (cm) 0.518 % Reduction in Area: 80.4% % Reduction in Volume: 90.2% Epithelialization: Medium (34-66%) Tunneling: No Undermining: No Wound Description Full Thickness Without Exposed Classification: Support Structures Wound Margin: Distinct, outline attached Exudate Medium Amount: Exudate Type: Serosanguineous Exudate Color: red, brown Foul Odor After Cleansing: No Wound Bed Granulation Amount: Large (67-100%) Exposed Structure Granulation Quality: Red, Pink, Hyper-granulation Fascia Exposed: No Necrotic Amount: Small (1-33%) Fat Layer Exposed:  No Necrotic Quality: Adherent Slough Tendon Exposed: No Muscle Exposed: No Joint Exposed: No Bone Exposed: No Limited to Skin Breakdown Periwound Skin Texture Texture Color Arnone, Charie L. ([id number]) No Abnormalities Noted: No No Abnormalities Noted: No Callus: No Atrophie Blanche: No Crepitus: No Cyanosis: No Excoriation: No Ecchymosis: No Fluctuance: No Erythema: No Friable: No Hemosiderin Staining: No Induration: No Mottled: Yes Localized Edema: No Pallor: No Rash: No Rubor: No Scarring: No Temperature / Pain Moisture Temperature: No Abnormality No Abnormalities Noted: No Tenderness on Palpation: Yes Dry / Scaly: No Maceration: No Moist: Yes Wound Preparation Ulcer Cleansing: Rinsed/Irrigated with Saline Topical Anesthetic Applied: Other: lidocaine 4%, Treatment Notes Wound #1 (Left, Lateral Lower Leg) 1. Cleansed with: Cleanse wound with antibacterial soap and water 3. Peri-wound Care: Moisturizing lotion 4. Dressing Applied: Prisma Ag 5. Secondary Dressing Applied Dry Gauze 7. Secured with 2 Layer Lite Compression System - Left Lower Extremity Electronic Signature(s) Signed: 11/06/2014 10:52:27 AM By: Regan Lemming BSN, RN Entered By: Regan Lemming on 11/06/2014 10:52:27 [name] ([id number]) -------------------------------------------------------------------------------- Vitals Details Patient Name: [name] Date of Service: 11/06/2014 10:45 AM Medical Record Number: [id number] Patient Account Number: [id number] Date of Birth/Sex: 1922-02-25 (81 y.o. Female) Treating RN: Afful, RN, BSN, Velva Harman Primary Care Physician: Einar Pheasant Other Clinician: Referring Physician: Einar Pheasant Treating Physician/Extender: Frann Rider in Treatment: 8 Vital Signs Time Taken: 10:42 Temperature (F): 98.2 Height (in): 67 Pulse (bpm): 76 Weight (lbs): 159 Respiratory Rate (breaths/min): 18 Body Mass Index (BMI): 24.9 Blood  Pressure (mmHg): 136/71 Reference Range: 80 - 120 mg / dl Electronic Signature(s) Signed: 11/06/2014 10:42:11 AM By: Regan Lemming BSN, RN Entered By: Regan Lemming on 11/06/2014 10:42:10

## 2014-11-06 NOTE — Progress Notes (Signed)
[name], [name] ([id number]) Visit Report for 11/06/2014 Chief Complaint Document Details Patient Name: [name], [name] 11/06/2014 10:45 Date of Service: AM Medical Record [id number] Number: Patient Account Number: [id number] [birth year] (81 y.o. Treating RN: Afful, RN, BSN, Velva Harman Date of Birth/Sex: Female) Other Clinician: Primary Care Physician: Einar Pheasant Treating Britto, Errol Referring Physician: Einar Pheasant Physician/Extender: Weeks in Treatment: 8 Information Obtained from: Patient Chief Complaint Patient presents to the wound care center today with an open arterial ulcer. The patient has had left lower extremity ulceration for about 1 month. Electronic Signature(s) Signed: 11/06/2014 11:14:08 AM By: Christin Fudge MD, FACS Entered By: Christin Fudge on 11/06/2014 11:14:08 [name] ([id number]) -------------------------------------------------------------------------------- HPI Details Patient Name: [name], [name] 11/06/2014 10:45 Date of Service: AM Medical Record [id number] Number: Patient Account Number: [id number] [birth year] (81 y.o. Treating RN: Afful, RN, BSN, Velva Harman Date of Birth/Sex: Female) Other Clinician: Primary Care Physician: Einar Pheasant Treating Britto, Errol Referring Physician: Einar Pheasant Physician/Extender: Weeks in Treatment: 8 History of Present Illness Location: left lower extremity ulceration Quality: Patient reports experiencing burning to affected area(s). Severity: Patient states wound are getting worse. Duration: Patient has had the wound for < 4 weeks prior to presenting for treatment Timing: Pain in wound is constant (hurts all the time) Context: The wound appeared gradually over time Modifying Factors: Other treatment(s) tried include:has been on oral anti-buttocks since the beginning of the month and had 10 days of Augmentin and now is on doxycycline. Associated Signs and Symptoms: Patient reports having  difficulty standing for long periods. HPI Description: 81 year old patient was admitted to [facility] between 08/13/2014 and 08/17/2014 with cellulitis of the left lower extremity.duplex study done then showed no DVT and the patient was on IV vancomycin and Rocephin and also received Unasyn later. The patient was sent home on 10 days of Augmentin. Her other comorbidities of atrial fibrillation, essential hypertension, diabetes mellitus type 2, coronary artery disease were managed with appropriate medications. on 09/04/2014 she was seen in the ER and was found to have a 10 cm ulceration on the left lower extremity lateral and superior to the lateral malleolus. The granulation tissue was noted and she was referred to the wound center. the ER notes that her ABIs were within normal limits and she was sent home on consultative management. a duplex scan of the lower extremities was done by the nursing home and this showed that the right ABI was 0.96 with a multiphasic waveform throughout the right leg. There was a monophasic waveform of the left leg suggesting left external iliac artery stenosis with blunting distally and the ABI could not be calculated on the left side. Moderate to severe ischemia was suspected of the left leg and a MR angiogram was recommended. 09/17/2014 -- Xray Left tibia and fibula --- unremarkable tibia and fibula Her wound culture is back and this has grown a heavy growth of staph aureus sensitive to ciprofloxacin and clindamycin and Bactrim. She has been on doxycycline since July 14 for 10 days and we will review her clinically before switching her antibiotics if need be. After reviewing her chart she is not going to be here till 09/17/2014 and hence I will call in Bactrim DS 1 twice a day for 14 days. She has seen Dr. Lucky Cowboy for an opinion and she is scheduled a procedure on this coming Thursday. other than that the pain and swelling continues and she has  no other acute problems. 09/25/2014 -- she had a recent procedure  done by Dr. Lucky Cowboy for diffuse SFA stenosis, popliteal occlusion and tibial disease. she had at angioplasty of the left peroneal artery and tibioperoneal trunk, left superficial Buck, Saryn L. ([id number]) femoral artery, left popliteal artery, distal SFA and popliteal artery. the patient is experiencing some swelling of the left lower extremity after the procedure. 10/09/2014 -- she has an appointment to see her vascular surgeon again this weekend for possibly a vascular Doppler. No other issues. 10/23/2014 -- The patient was recently seen by Dr. Lucky Cowboy on this past Friday. We do not have his reports but we do know that he was pleased with her progress and did not plan any further procedure in the near future. 10/30/2014 - we have received Dr. Bunnie Domino evaluation and he has mentioned that the duplex done showed a stenosis versus a lesion in the distal popliteal artery and tibioperoneal trunk in an area below her previous intervention and the overall flow was significantly better and the stent itself was patent. Her perfusion has improved but it was not optimized. Since her wound was improving he did not recommend any angiogram at this point. However if her wound worsens or infection develops he would recommend an angiogram and further evaluation to improve her perfusion. He is going to see her back in 6-8 weeks. Electronic Signature(s) Signed: 11/06/2014 11:14:28 AM By: Christin Fudge MD, FACS Entered By: Christin Fudge on 11/06/2014 11:14:28 [name] ([id number]) -------------------------------------------------------------------------------- Physical Exam Details Patient Name: [name], [name] 11/06/2014 10:45 Date of Service: AM Medical Record [id number] Number: Patient Account Number: [id number] [birth year] (81 y.o. Treating RN: Baruch Gouty, RN, BSN, Velva Harman Date of Birth/Sex: Female) Other Clinician: Primary Care Physician:  Einar Pheasant Treating Britto, Errol Referring Physician: Einar Pheasant Physician/Extender: Weeks in Treatment: 8 Constitutional . Pulse regular. Respirations normal and unlabored. Afebrile. . Eyes Nonicteric. Reactive to light. Ears, Nose, Mouth, and Throat Lips, teeth, and gums WNL.Marland Kitchen Moist mucosa without lesions . Neck supple and nontender. No palpable supraclavicular or cervical adenopathy. Normal sized without goiter. Respiratory WNL. No retractions.. Cardiovascular Pedal Pulses WNL. No clubbing, cyanosis or edema. Chest Breasts symmetical and no nipple discharge.. Breast tissue WNL, no masses, lumps, or tenderness.. Lymphatic No adneopathy. No adenopathy. No adenopathy. Musculoskeletal Adexa without tenderness or enlargement.. Digits and nails w/o clubbing, cyanosis, infection, petechiae, ischemia, or inflammatory conditions.. Integumentary (Hair, Skin) No suspicious lesions. No crepitus or fluctuance. No peri-wound warmth or erythema. No masses.Marland Kitchen Psychiatric Judgement and insight Intact.. No evidence of depression, anxiety, or agitation.. Notes the wound is healthy granulation tissue and needs no debridement today. Electronic Signature(s) Signed: 11/06/2014 11:14:58 AM By: Christin Fudge MD, FACS Entered By: Christin Fudge on 11/06/2014 11:14:57 [name] ([id number]) -------------------------------------------------------------------------------- Physician Orders Details Patient Name: [name], [name] 11/06/2014 10:45 Date of Service: AM Medical Record [id number] Number: Patient Account Number: [id number] October 01, 1922 (81 y.o. Treating RN: Baruch Gouty, RN, BSN, Velva Harman Date of Birth/Sex: Female) Other Clinician: Primary Care Physician: Einar Pheasant Treating Britto, Errol Referring Physician: Einar Pheasant Physician/Extender: Suella Grove in Treatment: 8 Verbal / Phone Orders: Yes Clinician: Afful, RN, BSN, Rita Read Back and Verified: Yes Diagnosis Coding Wound  Cleansing Wound #1 Left,Lateral Lower Leg o May Shower, gently pat wound dry prior to applying new dressing. o May shower with protection. o No tub bath. Skin Barriers/Peri-Wound Care Wound #1 Left,Lateral Lower Leg o Barrier cream Primary Wound Dressing Wound #1 Left,Lateral Lower Leg o Prisma Ag Secondary Dressing Wound #1 Left,Lateral Lower Leg o ABD pad o Drawtex Dressing Change Frequency Wound #1  Left,Lateral Lower Leg o Change dressing every week Follow-up Appointments Wound #1 Left,Lateral Lower Leg o Return Appointment in 1 week. Edema Control Wound #1 Left,Lateral Lower Leg o 2 Layer Lite Compression System - Left Lower Extremity - If toes turn blue or patient has pain, remove wrap immediately. [name], [name] ([id number]) Electronic Signature(s) Signed: 11/06/2014 11:06:53 AM By: Regan Lemming BSN, RN Signed: 11/06/2014 1:13:39 PM By: Christin Fudge MD, FACS Entered By: Regan Lemming on 11/06/2014 11:06:53 [name] ([id number]) -------------------------------------------------------------------------------- Problem List Details Patient Name: [name], [name] 11/06/2014 10:45 Date of Service: AM Medical Record [id number] Number: Patient Account Number: [id number] Aug 17, 1922 (81 y.o. Treating RN: Baruch Gouty, RN, BSN, Velva Harman Date of Birth/Sex: Female) Other Clinician: Primary Care Physician: Einar Pheasant Treating Britto, Errol Referring Physician: Einar Pheasant Physician/Extender: Weeks in Treatment: 8 Active Problems ICD-10 Encounter Code Description Active Date Diagnosis I70.248 Atherosclerosis of native arteries of left leg with ulceration 09/07/2014 Yes of other part of lower left leg L97.222 Non-pressure chronic ulcer of left calf with fat layer 09/07/2014 Yes exposed L03.116 Cellulitis of left lower limb 09/07/2014 Yes Inactive Problems Resolved Problems Electronic Signature(s) Signed: 11/06/2014 11:13:45 AM By: Christin Fudge MD,  FACS Entered By: Christin Fudge on 11/06/2014 11:13:45 [name] ([id number]) -------------------------------------------------------------------------------- Progress Note Details Patient Name: [name] 11/06/2014 10:45 Date of Service: AM Medical Record [id number] Number: Patient Account Number: [id number] December 23, 1922 (81 y.o. Treating RN: Afful, RN, BSN, Velva Harman Date of Birth/Sex: Female) Other Clinician: Primary Care Physician: Einar Pheasant Treating Britto, Errol Referring Physician: Einar Pheasant Physician/Extender: Weeks in Treatment: 8 Subjective Chief Complaint Information obtained from Patient Patient presents to the wound care center today with an open arterial ulcer. The patient has had left lower extremity ulceration for about 1 month. History of Present Illness (HPI) The following HPI elements were documented for the patient's wound: Location: left lower extremity ulceration Quality: Patient reports experiencing burning to affected area(s). Severity: Patient states wound are getting worse. Duration: Patient has had the wound for < 4 weeks prior to presenting for treatment Timing: Pain in wound is constant (hurts all the time) Context: The wound appeared gradually over time Modifying Factors: Other treatment(s) tried include:has been on oral anti-buttocks since the beginning of the month and had 10 days of Augmentin and now is on doxycycline. Associated Signs and Symptoms: Patient reports having difficulty standing for long periods. 81 year old patient was admitted to [facility] between 08/13/2014 and 08/17/2014 with cellulitis of the left lower extremity.duplex study done then showed no DVT and the patient was on IV vancomycin and Rocephin and also received Unasyn later. The patient was sent home on 10 days of Augmentin. Her other comorbidities of atrial fibrillation, essential hypertension, diabetes mellitus type 2, coronary  artery disease were managed with appropriate medications. on 09/04/2014 she was seen in the ER and was found to have a 10 cm ulceration on the left lower extremity lateral and superior to the lateral malleolus. The granulation tissue was noted and she was referred to the wound center. the ER notes that her ABIs were within normal limits and she was sent home on consultative management. a duplex scan of the lower extremities was done by the nursing home and this showed that the right ABI was 0.96 with a multiphasic waveform throughout the right leg. There was a monophasic waveform of the left leg suggesting left external iliac artery stenosis with blunting distally and the ABI could not be calculated on the left side. Moderate  to severe ischemia was suspected of the left leg and a MR angiogram was recommended. 09/17/2014 -- Xray Left tibia and fibula --- unremarkable tibia and fibula Her wound culture is back and this has grown a heavy growth of staph aureus sensitive to ciprofloxacin and clindamycin and Bactrim. [name], [name] ([id number]) She has been on doxycycline since July 14 for 10 days and we will review her clinically before switching her antibiotics if need be. After reviewing her chart she is not going to be here till 09/17/2014 and hence I will call in Bactrim DS 1 twice a day for 14 days. She has seen Dr. Lucky Cowboy for an opinion and she is scheduled a procedure on this coming Thursday. other than that the pain and swelling continues and she has no other acute problems. 09/25/2014 -- she had a recent procedure done by Dr. Lucky Cowboy for diffuse SFA stenosis, popliteal occlusion and tibial disease. she had at angioplasty of the left peroneal artery and tibioperoneal trunk, left superficial femoral artery, left popliteal artery, distal SFA and popliteal artery. the patient is experiencing some swelling of the left lower extremity after the procedure. 10/09/2014 -- she has an appointment to see  her vascular surgeon again this weekend for possibly a vascular Doppler. No other issues. 10/23/2014 -- The patient was recently seen by Dr. Lucky Cowboy on this past Friday. We do not have his reports but we do know that he was pleased with her progress and did not plan any further procedure in the near future. 10/30/2014 - we have received Dr. Bunnie Domino evaluation and he has mentioned that the duplex done showed a stenosis versus a lesion in the distal popliteal artery and tibioperoneal trunk in an area below her previous intervention and the overall flow was significantly better and the stent itself was patent. Her perfusion has improved but it was not optimized. Since her wound was improving he did not recommend any angiogram at this point. However if her wound worsens or infection develops he would recommend an angiogram and further evaluation to improve her perfusion. He is going to see her back in 6-8 weeks. Objective Constitutional Pulse regular. Respirations normal and unlabored. Afebrile. Vitals Time Taken: 10:42 AM, Height: 67 in, Weight: 159 lbs, BMI: 24.9, Temperature: 98.2 F, Pulse: 76 bpm, Respiratory Rate: 18 breaths/min, Blood Pressure: 136/71 mmHg. Eyes Nonicteric. Reactive to light. Ears, Nose, Mouth, and Throat Lips, teeth, and gums WNL.Marland Kitchen Moist mucosa without lesions . Neck supple and nontender. No palpable supraclavicular or cervical adenopathy. Normal sized without goiter. Respiratory WNL. No retractions.Marland Kitchen [name], [name] ([id number]) Cardiovascular Pedal Pulses WNL. No clubbing, cyanosis or edema. Chest Breasts symmetical and no nipple discharge.. Breast tissue WNL, no masses, lumps, or tenderness.. Lymphatic No adneopathy. No adenopathy. No adenopathy. Musculoskeletal Adexa without tenderness or enlargement.. Digits and nails w/o clubbing, cyanosis, infection, petechiae, ischemia, or inflammatory conditions.Marland Kitchen Psychiatric Judgement and insight Intact.. No evidence of  depression, anxiety, or agitation.. General Notes: the wound is healthy granulation tissue and needs no debridement today. Integumentary (Hair, Skin) No suspicious lesions. No crepitus or fluctuance. No peri-wound warmth or erythema. No masses.. Wound #1 status is Open. Original cause of wound was Gradually Appeared. The wound is located on the Left,Lateral Lower Leg. The wound measures 5.5cm length x 1.2cm width x 0.1cm depth; 5.184cm^2 area and 0.518cm^3 volume. The wound is limited to skin breakdown. There is no tunneling or undermining noted. There is a medium amount of serosanguineous drainage noted. The wound margin is distinct with  the outline attached to the wound base. There is large (67-100%) red, pink granulation within the wound bed. There is a small (1-33%) amount of necrotic tissue within the wound bed including Adherent Slough. The periwound skin appearance exhibited: Moist, Mottled. The periwound skin appearance did not exhibit: Callus, Crepitus, Excoriation, Fluctuance, Friable, Induration, Localized Edema, Rash, Scarring, Dry/Scaly, Maceration, Atrophie Blanche, Cyanosis, Ecchymosis, Hemosiderin Staining, Pallor, Rubor, Erythema. Periwound temperature was noted as No Abnormality. The periwound has tenderness on palpation. Assessment Active Problems ICD-10 I70.248 - Atherosclerosis of native arteries of left leg with ulceration of other part of lower left leg L97.222 - Non-pressure chronic ulcer of left calf with fat layer exposed L03.116 - Cellulitis of left lower limb [name], Kara L. ([id number]) Wound has been fairly clean over the last week and has healthy granulation tissue. I will continue silver collagen and a 2 layer compression wrap. Her daughter is at the bedside today and I have discussed the possibility of using a skin substitute and we will let her know what her copayment is. She will come back and see me next week. Plan Wound Cleansing: Wound #1  Left,Lateral Lower Leg: May Shower, gently pat wound dry prior to applying new dressing. May shower with protection. No tub bath. Skin Barriers/Peri-Wound Care: Wound #1 Left,Lateral Lower Leg: Barrier cream Primary Wound Dressing: Wound #1 Left,Lateral Lower Leg: Prisma Ag Secondary Dressing: Wound #1 Left,Lateral Lower Leg: ABD pad Drawtex Dressing Change Frequency: Wound #1 Left,Lateral Lower Leg: Change dressing every week Follow-up Appointments: Wound #1 Left,Lateral Lower Leg: Return Appointment in 1 week. Edema Control: Wound #1 Left,Lateral Lower Leg: 2 Layer Lite Compression System - Left Lower Extremity - If toes turn blue or patient has pain, remove wrap immediately. Wound has been fairly clean over the last week and has healthy granulation tissue. I will continue silver collagen and a 2 layer compression wrap. Her daughter is at the bedside today and I have discussed the possibility of using a skin substitute and we will let her know what her copayment is. She will come back and see me next week. [name], [name] ([id number]) Electronic Signature(s) Signed: 11/06/2014 11:16:10 AM By: Christin Fudge MD, FACS Entered By: Christin Fudge on 11/06/2014 11:16:10 [name] ([id number]) -------------------------------------------------------------------------------- SuperBill Details Patient Name: [name] Date of Service: 11/06/2014 Medical Record Patient Account Number: [id number] Number: Afful, RN, BSN, Treating RN: [birth year] (81 y.o. Velva Harman Date of Birth/Sex: Female) Other Clinician: Primary Care Physician: Einar Pheasant Treating Britto, Errol Referring Physician: Einar Pheasant Physician/Extender: Suella Grove in Treatment: 8 Diagnosis Coding ICD-10 Codes Code Description I70.248 Atherosclerosis of native arteries of left leg with ulceration of other part of lower left leg L97.222 Non-pressure chronic ulcer of left calf with fat layer  exposed L03.116 Cellulitis of left lower limb Facility Procedures CPT4: Description Modifier Quantity Code [id number] (Facility Use Only) [phone] - [place] COMPR[facility] LWR LT 1 LEG Physician Procedures CPT4: Description Modifier Quantity Code [id number] 99213 - WC PHYS LEVEL 3 - EST PT 1 ICD-10 Description Diagnosis I70.248 Atherosclerosis of native arteries of left leg with ulceration of other part of lower left leg L97.222 Non-pressure chronic ulcer of  left calf with fat layer exposed L03.116 Cellulitis of left lower limb Electronic Signature(s) Signed: 11/06/2014 11:16:30 AM By: Christin Fudge MD, FACS Previous Signature: 11/06/2014 11:07:12 AM Version By: Regan Lemming BSN, RN Entered By: Christin Fudge on 11/06/2014 11:16:30

## 2014-11-13 ENCOUNTER — Ambulatory Visit: Payer: Medicare Other | Admitting: Surgery

## 2014-11-19 ENCOUNTER — Encounter: Payer: Self-pay | Admitting: *Deleted

## 2014-11-19 ENCOUNTER — Telehealth: Payer: Self-pay | Admitting: Internal Medicine

## 2014-11-19 NOTE — Telephone Encounter (Signed)
[name] called from [facility] needing a verbal order to continue physical therapy twice a week for four weeks. [name] [phone] to give verbal order. Thank You!

## 2014-11-19 NOTE — Telephone Encounter (Signed)
Waiting to hear back from [facility] (requested [facility]) & sent mychart regarding new PCP

## 2014-11-19 NOTE — Telephone Encounter (Signed)
I placed an order for home health - on your desk.  Need to clarify who her primary care is now.  See previous messages.  If does not have set up yet, I can sign these initial orders from her discharge from [facility], but will need future orders signed by her PCP.

## 2014-11-19 NOTE — Telephone Encounter (Signed)
Left [name] a message to update her on the hold up of the order

## 2014-11-19 NOTE — Telephone Encounter (Signed)
Please advise 

## 2014-11-20 ENCOUNTER — Encounter: Payer: Medicare Other | Attending: Surgery | Admitting: Surgery

## 2014-11-20 DIAGNOSIS — I70248 Atherosclerosis of native arteries of left leg with ulceration of other part of lower left leg: Secondary | ICD-10-CM | POA: Diagnosis not present

## 2014-11-20 DIAGNOSIS — I2510 Atherosclerotic heart disease of native coronary artery without angina pectoris: Secondary | ICD-10-CM | POA: Insufficient documentation

## 2014-11-20 DIAGNOSIS — E119 Type 2 diabetes mellitus without complications: Secondary | ICD-10-CM | POA: Insufficient documentation

## 2014-11-20 DIAGNOSIS — I4891 Unspecified atrial fibrillation: Secondary | ICD-10-CM | POA: Insufficient documentation

## 2014-11-20 DIAGNOSIS — L03116 Cellulitis of left lower limb: Secondary | ICD-10-CM | POA: Diagnosis not present

## 2014-11-20 DIAGNOSIS — L97222 Non-pressure chronic ulcer of left calf with fat layer exposed: Secondary | ICD-10-CM | POA: Insufficient documentation

## 2014-11-20 DIAGNOSIS — I10 Essential (primary) hypertension: Secondary | ICD-10-CM | POA: Insufficient documentation

## 2014-11-20 NOTE — Telephone Encounter (Signed)
Received a call back from [facility]. After speaking with the family she was notified that she will be seeing Dr. Janae Bridgeman in [place]. Just need an order from you to get started. Form placed back in red folder. A verbal order can also be made.

## 2014-11-20 NOTE — Progress Notes (Addendum)
CEAZIA, HARB ([id number]) Visit Report for 11/20/2014 Chief Complaint Document Details Patient Name: [name], [name] 11/20/2014 10:45 Date of Service: AM Medical Record [id number] Number: Patient Account Number: [id number] 05-26-1922 (81 y.o. Treating RN: Afful, RN, BSN, Velva Harman Date of Birth/Sex: Female) Other Clinician: Primary Care Physician: Einar Pheasant Treating Nikkole Placzek Referring Physician: Einar Pheasant Physician/Extender: Weeks in Treatment: 10 Information Obtained from: Patient Chief Complaint Patient presents to the wound care center today with an open arterial ulcer. The patient has had left lower extremity ulceration for about 1 month. Electronic Signature(s) Signed: 11/20/2014 11:32:23 AM By: Christin Fudge MD, FACS Previous Signature: 11/20/2014 11:30:58 AM Version By: Christin Fudge MD, FACS Entered By: Christin Fudge on 11/20/2014 11:32:22 [name] ([id number]) -------------------------------------------------------------------------------- Debridement Details Patient Name: Kara, [name] 11/20/2014 10:45 Date of Service: AM Medical Record [id number] Number: Patient Account Number: [id number] 1922-03-21 (81 y.o. Treating RN: Baruch Gouty, RN, BSN, Velva Harman Date of Birth/Sex: Female) Other Clinician: Primary Care Physician: Einar Pheasant Treating Mcclain Shall Referring Physician: Einar Pheasant Physician/Extender: Weeks in Treatment: 10 Debridement Performed for Wound #3 Left,Medial,Dorsal Foot Assessment: Performed By: Physician Christin Fudge, MD Debridement: Open Wound/Selective Debridement Selective Description: Pre-procedure Yes Verification/Time Out Taken: Start Time: 11:14 Pain Control: Lidocaine 4% Topical Solution Level: Non-Viable Tissue Total Area Debrided (L x 1 (cm) x 2 (cm) = 2 (cm) W): Tissue and other Non-Viable, Eschar, Fibrin/Slough, Subcutaneous material debrided: Instrument: Forceps Bleeding: Moderate Hemostasis  Achieved: Silver Nitrate End Time: 11:17 Procedural Pain: 0 Post Procedural Pain: 0 Response to Treatment: Procedure was tolerated well Post Debridement Measurements of Total Wound Length: (cm) 1 Stage: Unstageable/Unclassified Width: (cm) 2 Depth: (cm) 0.1 Volume: (cm) 0.157 Post Procedure Diagnosis Same as Pre-procedure Electronic Signature(s) Signed: 11/20/2014 12:01:09 PM By: Regan Lemming BSN, RN Signed: 11/20/2014 12:55:57 PM By: Christin Fudge MD, FACS Entered By: Regan Lemming on 11/20/2014 12:01:09 [name] ([id number] Dresser, Dolly Rias ([id number]) -------------------------------------------------------------------------------- Debridement Details Patient Name: Kara, [name] 11/20/2014 10:45 Date of Service: AM Medical Record [id number] Number: Patient Account Number: [id number] 1923-02-07 (81 y.o. Treating RN: Baruch Gouty, RN, BSN, Velva Harman Date of Birth/Sex: Female) Other Clinician: Primary Care Physician: Einar Pheasant Treating Aisea Bouldin Referring Physician: Einar Pheasant Physician/Extender: Weeks in Treatment: 10 Debridement Performed for Wound #1 Left,Lateral Lower Leg Assessment: Performed By: Physician Christin Fudge, MD Debridement: Open Wound/Selective Debridement Selective Description: Pre-procedure Yes Verification/Time Out Taken: Start Time: 11:17 Pain Control: Lidocaine 4% Topical Solution Level: Non-Viable Tissue Total Area Debrided (L x 4.7 (cm) x 0.5 (cm) = 2.35 (cm) W): Tissue and other Non-Viable, Eschar, Fibrin/Slough, Other, Subcutaneous material debrided: Instrument: Forceps Bleeding: Moderate Hemostasis Achieved: Silver Nitrate End Time: 11:20 Procedural Pain: 0 Post Procedural Pain: 0 Response to Treatment: Procedure was tolerated well Post Debridement Measurements of Total Wound Length: (cm) 4.7 Width: (cm) 0.5 Depth: (cm) 0.1 Volume: (cm) 0.185 Post Procedure Diagnosis Same as Pre-procedure Electronic  Signature(s) Signed: 11/20/2014 12:02:49 PM By: Regan Lemming BSN, RN Signed: 11/20/2014 12:55:57 PM By: Christin Fudge MD, FACS Entered By: Regan Lemming on 11/20/2014 12:02:48 [name] ([id number] Dresser, Dolly Rias ([id number]) -------------------------------------------------------------------------------- HPI Details Patient Name: Kara, [name] 11/20/2014 10:45 Date of Service: AM Medical Record [id number] Number: Patient Account Number: [id number] 1922/06/03 (81 y.o. Treating RN: Afful, RN, BSN, Velva Harman Date of Birth/Sex: Female) Other Clinician: Primary Care Physician: Einar Pheasant Treating Tiaunna Buford Referring Physician: Einar Pheasant Physician/Extender: Weeks in Treatment: 10 History of Present Illness Location: left lower extremity ulceration Quality: Patient reports experiencing burning to affected area(s).  Severity: Patient states wound are getting worse. Duration: Patient has had the wound for < 4 weeks prior to presenting for treatment Timing: Pain in wound is constant (hurts all the time) Context: The wound appeared gradually over time Modifying Factors: Other treatment(s) tried include:has been on oral anti-buttocks since the beginning of the month and had 10 days of Augmentin and now is on doxycycline. Associated Signs and Symptoms: Patient reports having difficulty standing for long periods. HPI Description: 81 year old patient was admitted to [facility] between 08/13/2014 and 08/17/2014 with cellulitis of the left lower extremity.duplex study done then showed no DVT and the patient was on IV vancomycin and Rocephin and also received Unasyn later. The patient was sent home on 10 days of Augmentin. Her other comorbidities of atrial fibrillation, essential hypertension, diabetes mellitus type 2, coronary artery disease were managed with appropriate medications. on 09/04/2014 she was seen in the ER and was found to have a 10 cm  ulceration on the left lower extremity lateral and superior to the lateral malleolus. The granulation tissue was noted and she was referred to the wound center. the ER notes that her ABIs were within normal limits and she was sent home on consultative management. a duplex scan of the lower extremities was done by the nursing home and this showed that the right ABI was 0.96 with a multiphasic waveform throughout the right leg. There was a monophasic waveform of the left leg suggesting left external iliac artery stenosis with blunting distally and the ABI could not be calculated on the left side. Moderate to severe ischemia was suspected of the left leg and a MR angiogram was recommended. 09/17/2014 -- Xray Left tibia and fibula --- unremarkable tibia and fibula Her wound culture is back and this has grown a heavy growth of staph aureus sensitive to ciprofloxacin and clindamycin and Bactrim. She has been on doxycycline since July 14 for 10 days and we will review her clinically before switching her antibiotics if need be. After reviewing her chart she is not going to be here till 09/17/2014 and hence I will call in Bactrim DS 1 twice a day for 14 days. She has seen Dr. Lucky Cowboy for an opinion and she is scheduled a procedure on this coming Thursday. other than that the pain and swelling continues and she has no other acute problems. 09/25/2014 -- she had a recent procedure done by Dr. Lucky Cowboy for diffuse SFA stenosis, popliteal occlusion and tibial disease. she had at angioplasty of the left peroneal artery and tibioperoneal trunk, left superficial Ungar, Cincere L. ([id number]) femoral artery, left popliteal artery, distal SFA and popliteal artery. the patient is experiencing some swelling of the left lower extremity after the procedure. 10/09/2014 -- she has an appointment to see her vascular surgeon again this weekend for possibly a vascular Doppler. No other issues. 10/23/2014 -- The patient was  recently seen by Dr. Lucky Cowboy on this past Friday. We do not have his reports but we do know that he was pleased with her progress and did not plan any further procedure in the near future. 10/30/2014 - we have received Dr. Bunnie Domino evaluation and he has mentioned that the duplex done showed a stenosis versus a lesion in the distal popliteal artery and tibioperoneal trunk in an area below her previous intervention and the overall flow was significantly better and the stent itself was patent. Her perfusion has improved but it was not optimized. Since her wound was improving he did not recommend  any angiogram at this point. However if her wound worsens or infection develops he would recommend an angiogram and further evaluation to improve her perfusion. He is going to see her back in 6-8 weeks. 11/20/2014 -- she was not here last week because of her illness and is here to see as after 2 weeks. Her daughter has checked with her insurance company and she will have no out-of-pocket expense for her skin substitute. Electronic Signature(s) Signed: 11/20/2014 11:33:23 AM By: Christin Fudge MD, FACS Entered By: Christin Fudge on 11/20/2014 11:33:20 [name] ([id number]) -------------------------------------------------------------------------------- Physical Exam Details Patient Name: LAQUASHA, GROOME 11/20/2014 10:45 Date of Service: AM Medical Record [id number] Number: Patient Account Number: [id number] Oct 04, 1922 (81 y.o. Treating RN: Baruch Gouty, RN, BSN, Velva Harman Date of Birth/Sex: Female) Other Clinician: Primary Care Physician: Einar Pheasant Treating Jekhi Bolin Referring Physician: Einar Pheasant Physician/Extender: Weeks in Treatment: 10 Constitutional . Pulse regular. Respirations normal and unlabored. Afebrile. . Eyes Nonicteric. Reactive to light. Ears, Nose, Mouth, and Throat Lips, teeth, and gums WNL.Marland Kitchen Moist mucosa without lesions . Neck supple and nontender. No palpable  supraclavicular or cervical adenopathy. Normal sized without goiter. Respiratory WNL. No retractions.. Cardiovascular Pedal Pulses WNL. No clubbing, cyanosis or edema. Lymphatic No adneopathy. No adenopathy. No adenopathy. Musculoskeletal Adexa without tenderness or enlargement.. Digits and nails w/o clubbing, cyanosis, infection, petechiae, ischemia, or inflammatory conditions.. Integumentary (Hair, Skin) No suspicious lesions. No crepitus or fluctuance. No peri-wound warmth or erythema. No masses.Marland Kitchen Psychiatric Judgement and insight Intact.. No evidence of depression, anxiety, or agitation.. Notes Since she had not come for 2 weeks she had some eschar and exudate over the wound which was easily removed with saline and a forcep and the wound under this is fairly clean. Electronic Signature(s) Signed: 11/20/2014 11:34:33 AM By: Christin Fudge MD, FACS Entered By: Christin Fudge on 11/20/2014 11:34:31 [name] ([id number]) -------------------------------------------------------------------------------- Physician Orders Details Patient Name: BRAILYNN, BRETH 11/20/2014 10:45 Date of Service: AM Medical Record [id number] Number: Patient Account Number: [id number] [birth year] (81 y.o. Treating RN: Afful, RN, BSN, Velva Harman Date of Birth/Sex: Female) Other Clinician: Primary Care Physician: Einar Pheasant Treating Rondrick Barreira Referring Physician: Einar Pheasant Physician/Extender: Weeks in Treatment: 10 Verbal / Phone Orders: Yes Clinician: Afful, RN, BSN, Rita Read Back and Verified: Yes Diagnosis Coding ICD-10 Coding Code Description [phone] Atherosclerosis of native arteries of left leg with ulceration of other part of lower left leg L97.222 Non-pressure chronic ulcer of left calf with fat layer exposed L03.116 Cellulitis of left lower limb Wound Cleansing Wound #1 Left,Lateral Lower Leg o May Shower, gently pat wound dry prior to applying new dressing. o May shower  with protection. o No tub bath. Wound #3 Left,Medial,Dorsal Foot o May Shower, gently pat wound dry prior to applying new dressing. o May shower with protection. o No tub bath. Wound #4 Left,Lateral,Dorsal Foot o May Shower, gently pat wound dry prior to applying new dressing. o May shower with protection. o No tub bath. Anesthetic Wound #1 Left,Lateral Lower Leg o Topical Lidocaine 4% cream applied to wound bed prior to debridement Wound #3 Left,Medial,Dorsal Foot o Topical Lidocaine 4% cream applied to wound bed prior to debridement Wound #4 Left,Lateral,Dorsal Foot o Topical Lidocaine 4% cream applied to wound bed prior to debridement Skin Barriers/Peri-Wound Care Wound #1 Left,Lateral Lower Leg Choudhry, Shenicka L. ([id number]) o Barrier cream Wound #3 Left,Medial,Dorsal Foot o Barrier cream Wound #4 Left,Lateral,Dorsal Foot o Barrier cream Primary Wound Dressing Wound #1 Left,Lateral Lower Leg o Prisma Ag  Wound #3 Left,Medial,Dorsal Foot o Aquacel Ag Wound #4 Left,Lateral,Dorsal Foot o Aquacel Ag Secondary Dressing Wound #1 Left,Lateral Lower Leg o ABD pad Wound #3 Left,Medial,Dorsal Foot o ABD pad Wound #4 Left,Lateral,Dorsal Foot o ABD pad Dressing Change Frequency Wound #1 Left,Lateral Lower Leg o Change dressing every week Follow-up Appointments Wound #1 Left,Lateral Lower Leg o Return Appointment in 1 week. Edema Control Wound #1 Left,Lateral Lower Leg o 3 Layer Compression System - Left Lower Extremity - Profore lite used. o Elevate legs to the level of the heart and pump ankles as often as possible o Other: - Monitor toes for discoloration;blue/purple Notes Order Grafix 2x3 for next week visit Electronic Signature(s) JOESPHINE, SCHEMM ([id number]) Signed: 11/20/2014 2:13:19 PM By: Regan Lemming BSN, RN Signed: 11/20/2014 5:06:09 PM By: Christin Fudge MD, FACS Previous Signature: 11/20/2014 11:58:23 AM Version By:  Regan Lemming BSN, RN Previous Signature: 11/20/2014 12:55:57 PM Version By: Christin Fudge MD, FACS Entered By: Regan Lemming on 11/20/2014 14:13:18 [name] ([id number]) -------------------------------------------------------------------------------- Problem List Details Patient Name: EULAR, PANEK 11/20/2014 10:45 Date of Service: AM Medical Record [id number] Number: Patient Account Number: [id number] 12-30-1922 (81 y.o. Treating RN: Baruch Gouty, RN, BSN, Velva Harman Date of Birth/Sex: Female) Other Clinician: Primary Care Physician: Einar Pheasant Treating Levonia Wolfley Referring Physician: Einar Pheasant Physician/Extender: Weeks in Treatment: 10 Active Problems ICD-10 Encounter Code Description Active Date Diagnosis I70.248 Atherosclerosis of native arteries of left leg with ulceration 09/07/2014 Yes of other part of lower left leg L97.222 Non-pressure chronic ulcer of left calf with fat layer 09/07/2014 Yes exposed L03.116 Cellulitis of left lower limb 09/07/2014 Yes Inactive Problems Resolved Problems Electronic Signature(s) Signed: 11/20/2014 11:31:43 AM By: Christin Fudge MD, FACS Previous Signature: 11/20/2014 11:30:18 AM Version By: Christin Fudge MD, FACS Entered By: Christin Fudge on 11/20/2014 11:31:43 [name] ([id number]) -------------------------------------------------------------------------------- Progress Note Details Patient Name: YANIA, BOGIE 11/20/2014 10:45 Date of Service: AM Medical Record [id number] Number: Patient Account Number: [id number] December 08, 1922 (81 y.o. Treating RN: Afful, RN, BSN, Velva Harman Date of Birth/Sex: Female) Other Clinician: Primary Care Physician: Einar Pheasant Treating Jacqueline Spofford Referring Physician: Einar Pheasant Physician/Extender: Weeks in Treatment: 10 Subjective Chief Complaint Information obtained from Patient Patient presents to the wound care center today with an open arterial ulcer. The patient has had left  lower extremity ulceration for about 1 month. History of Present Illness (HPI) The following HPI elements were documented for the patient's wound: Location: left lower extremity ulceration Quality: Patient reports experiencing burning to affected area(s). Severity: Patient states wound are getting worse. Duration: Patient has had the wound for < 4 weeks prior to presenting for treatment Timing: Pain in wound is constant (hurts all the time) Context: The wound appeared gradually over time Modifying Factors: Other treatment(s) tried include:has been on oral anti-buttocks since the beginning of the month and had 10 days of Augmentin and now is on doxycycline. Associated Signs and Symptoms: Patient reports having difficulty standing for long periods. 81 year old patient was admitted to [facility] between 08/13/2014 and 08/17/2014 with cellulitis of the left lower extremity.duplex study done then showed no DVT and the patient was on IV vancomycin and Rocephin and also received Unasyn later. The patient was sent home on 10 days of Augmentin. Her other comorbidities of atrial fibrillation, essential hypertension, diabetes mellitus type 2, coronary artery disease were managed with appropriate medications. on 09/04/2014 she was seen in the ER and was found to have a 10 cm ulceration on the left lower extremity  lateral and superior to the lateral malleolus. The granulation tissue was noted and she was referred to the wound center. the ER notes that her ABIs were within normal limits and she was sent home on consultative management. a duplex scan of the lower extremities was done by the nursing home and this showed that the right ABI was 0.96 with a multiphasic waveform throughout the right leg. There was a monophasic waveform of the left leg suggesting left external iliac artery stenosis with blunting distally and the ABI could not be calculated on the left side. Moderate to  severe ischemia was suspected of the left leg and a MR angiogram was recommended. 09/17/2014 -- Xray Left tibia and fibula --- unremarkable tibia and fibula Her wound culture is back and this has grown a heavy growth of staph aureus sensitive to ciprofloxacin and clindamycin and Bactrim. KAELYN, INNOCENT ([id number]) She has been on doxycycline since July 14 for 10 days and we will review her clinically before switching her antibiotics if need be. After reviewing her chart she is not going to be here till 09/17/2014 and hence I will call in Bactrim DS 1 twice a day for 14 days. She has seen Dr. Lucky Cowboy for an opinion and she is scheduled a procedure on this coming Thursday. other than that the pain and swelling continues and she has no other acute problems. 09/25/2014 -- she had a recent procedure done by Dr. Lucky Cowboy for diffuse SFA stenosis, popliteal occlusion and tibial disease. she had at angioplasty of the left peroneal artery and tibioperoneal trunk, left superficial femoral artery, left popliteal artery, distal SFA and popliteal artery. the patient is experiencing some swelling of the left lower extremity after the procedure. 10/09/2014 -- she has an appointment to see her vascular surgeon again this weekend for possibly a vascular Doppler. No other issues. 10/23/2014 -- The patient was recently seen by Dr. Lucky Cowboy on this past Friday. We do not have his reports but we do know that he was pleased with her progress and did not plan any further procedure in the near future. 10/30/2014 - we have received Dr. Bunnie Domino evaluation and he has mentioned that the duplex done showed a stenosis versus a lesion in the distal popliteal artery and tibioperoneal trunk in an area below her previous intervention and the overall flow was significantly better and the stent itself was patent. Her perfusion has improved but it was not optimized. Since her wound was improving he did not recommend any angiogram at this  point. However if her wound worsens or infection develops he would recommend an angiogram and further evaluation to improve her perfusion. He is going to see her back in 6-8 weeks. 11/20/2014 -- she was not here last week because of her illness and is here to see as after 2 weeks. Her daughter has checked with her insurance company and she will have no out-of-pocket expense for her skin substitute. Objective Constitutional Pulse regular. Respirations normal and unlabored. Afebrile. Vitals Time Taken: 10:52 AM, Height: 67 in, Weight: 159 lbs, BMI: 24.9, Temperature: 97.7 F, Pulse: 79 bpm, Respiratory Rate: 18 breaths/min, Blood Pressure: 123/72 mmHg. Eyes Nonicteric. Reactive to light. Ears, Nose, Mouth, and Throat Lips, teeth, and gums WNL.Marland Kitchen Moist mucosa without lesions . Neck supple and nontender. No palpable supraclavicular or cervical adenopathy. Normal sized without goiter. CARYS, MALINA ([id number]) Respiratory WNL. No retractions.. Cardiovascular Pedal Pulses WNL. No clubbing, cyanosis or edema. Lymphatic No adneopathy. No adenopathy. No adenopathy. Musculoskeletal Adexa without  tenderness or enlargement.. Digits and nails w/o clubbing, cyanosis, infection, petechiae, ischemia, or inflammatory conditions.Marland Kitchen Psychiatric Judgement and insight Intact.. No evidence of depression, anxiety, or agitation.. General Notes: Since she had not come for 2 weeks she had some eschar and exudate over the wound which was easily removed with saline and a forcep and the wound under this is fairly clean. Integumentary (Hair, Skin) No suspicious lesions. No crepitus or fluctuance. No peri-wound warmth or erythema. No masses.. Wound #1 status is Open. Original cause of wound was Gradually Appeared. The wound is located on the Left,Lateral Lower Leg. The wound measures 4.7cm length x 0.5cm width x 0.1cm depth; 1.846cm^2 area and 0.185cm^3 volume. Wound #3 status is Open. Original cause of wound  was Pressure Injury. The wound is located on the Left,Medial,Dorsal Foot. The wound measures 1cm length x 2cm width x 0.1cm depth; 1.571cm^2 area and 0.157cm^3 volume. The wound is limited to skin breakdown. There is no tunneling or undermining noted. There is a small amount of serosanguineous drainage noted. The wound margin is distinct with the outline attached to the wound base. There is no granulation within the wound bed. There is a large (67-100%) amount of necrotic tissue within the wound bed including Eschar. The periwound skin appearance exhibited: Dry/Scaly. The periwound skin appearance did not exhibit: Callus, Crepitus, Excoriation, Fluctuance, Friable, Induration, Localized Edema, Rash, Scarring, Maceration, Moist, Atrophie Blanche, Cyanosis, Ecchymosis, Hemosiderin Staining, Mottled, Pallor, Rubor, Erythema. Periwound temperature was noted as No Abnormality. The periwound has tenderness on palpation. Wound #4 status is Open. Original cause of wound was Shear/Friction. The wound is located on the Left,Lateral,Dorsal Foot. The wound measures 1.4cm length x 1cm width x 0.1cm depth; 1.1cm^2 area and 0.11cm^3 volume. Assessment Active Problems ICD-10 ASAIAH, HUNNICUTT ([id number]) I70.248 - Atherosclerosis of native arteries of left leg with ulceration of other part of lower left leg L97.222 - Non-pressure chronic ulcer of left calf with fat layer exposed L03.116 - Cellulitis of left lower limb I have recommended Prisma over the left lower extremity and silver alginate on the dorsum of her left foot. She is ready for a skin substitute and we will try and obtain grafix for her left lower extremity wound. She'll come back to see me next week. Plan I have recommended Prisma over the left lower extremity and silver alginate on the dorsum of her left foot. She is ready for a skin substitute and we will try and obtain grafix for her left lower extremity wound. She'll come back to see me  next week. Electronic Signature(s) Signed: 11/20/2014 11:36:01 AM By: Christin Fudge MD, FACS Entered By: Christin Fudge on 11/20/2014 11:35:55 [name] ([id number]) -------------------------------------------------------------------------------- SuperBill Details Patient Name: [name] Date of Service: 11/20/2014 Medical Record Patient Account Number: [id number] Number: Afful, RN, BSN, Treating RN: 17-Jun-1922 (81 y.o. Velva Harman Date of Birth/Sex: Female) Other Clinician: Primary Care Physician: Einar Pheasant Treating Ziyad Dyar Referring Physician: Einar Pheasant Physician/Extender: Suella [place] in Treatment: 10 Diagnosis Coding ICD-10 Codes Code Description I70.248 Atherosclerosis of native arteries of left leg with ulceration of other part of lower left leg L97.222 Non-pressure chronic ulcer of left calf with fat layer exposed L03.116 Cellulitis of left lower limb Physician Procedures CPT4: Description Modifier Quantity Code [id number] - WC PHYS LEVEL 3 - EST PT 1 ICD-10 Description Diagnosis I70.248 Atherosclerosis of native arteries of left leg with 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

## 2014-11-20 NOTE — Telephone Encounter (Signed)
Received & placed on your desk

## 2014-11-20 NOTE — Telephone Encounter (Signed)
Forms signed and placed in your box.  See note.  MAR attached.  I adjusted med doses.  They will need to clarify med doses with pts daughter.

## 2014-11-20 NOTE — Telephone Encounter (Signed)
Forms faxed

## 2014-11-20 NOTE — Telephone Encounter (Signed)
Need MAR to confirm medications.

## 2014-11-20 NOTE — Progress Notes (Addendum)
AMONIE, WISSER ([id number]) Visit Report for 11/20/2014 Arrival Information Details Patient Name: [name], [name]. Date of Service: 11/20/2014 10:45 AM Medical Record Number: [id number] Patient Account Number: [id number] Date of Birth/Sex: 13-Apr-1922 (81 y.o. Female) Treating RN: Afful, RN, BSN, Velva Harman Primary Care Physician: Einar Pheasant Other Clinician: Referring Physician: Einar Pheasant Treating Physician/Extender: Frann Rider in Treatment: 10 Visit Information History Since Last Visit Added or deleted any medications: No Patient Arrived: Wheel Chair Any new allergies or adverse reactions: No Arrival Time: 10:46 Had a fall or experienced change in No Accompanied By: dtr activities of daily [name] that may affect Transfer Assistance: Manual risk of falls: Patient Identification Verified: Yes Signs or symptoms of abuse/neglect since last No Secondary Verification Process Yes visito Completed: Hospitalized since last visit: No Patient Requires Transmission- No Has Dressing in Place as Prescribed: Yes Based Precautions: Has Compression in Place as Prescribed: Yes Patient Has Alerts: Yes Pain Present Now: No Patient Alerts: Patient on Blood Thinner Eliquis. R ABI:0.96 Electronic Signature(s) Signed: 11/20/2014 12:08:59 PM By: Regan Lemming BSN, RN Previous Signature: 11/20/2014 10:50:36 AM Version By: Regan Lemming BSN, RN Entered By: Regan Lemming on 11/20/2014 12:08:59 [name] ([id number]) -------------------------------------------------------------------------------- Encounter Discharge Information Details Patient Name: [name]. Date of Service: 11/20/2014 10:45 AM Medical Record Number: [id number] Patient Account Number: [id number] Date of Birth/Sex: 06-Sep-1922 (81 y.o. Female) Treating RN: Afful, RN, BSN, Velva Harman Primary Care Physician: Einar Pheasant Other Clinician: Referring Physician: Einar Pheasant Treating Physician/Extender: Frann Rider in Treatment: 10 Encounter Discharge Information Items Discharge Pain Level: 0 Discharge Condition: Stable Ambulatory Status: Wheelchair Discharge Destination: Home Transportation: Private Auto Accompanied By: dtr Schedule Follow-up Appointment: No Medication Reconciliation completed and provided to Patient/Care No Geoffery Aultman: Provided on Clinical Summary of Care: 11/20/2014 Form Type Recipient Paper Patient LS Electronic Signature(s) Signed: 11/20/2014 12:06:57 PM By: Regan Lemming BSN, RN Previous Signature: 11/20/2014 11:33:23 AM Version By: Ruthine Dose Entered By: Regan Lemming on 11/20/2014 12:06:55 [name] ([id number]) -------------------------------------------------------------------------------- Lower Extremity Assessment Details Patient Name: [name]. Date of Service: 11/20/2014 10:45 AM Medical Record Number: [id number] Patient Account Number: [id number] Date of Birth/Sex: Jul 14, 1922 (81 y.o. Female) Treating RN: Afful, RN, BSN, Velva Harman Primary Care Physician: Einar Pheasant Other Clinician: Referring Physician: Einar Pheasant Treating Physician/Extender: Frann Rider in Treatment: 10 Edema Assessment Assessed: [Left: No] [Right: No] E[Left: dema] [Right: :] Calf Left: Right: Point of Measurement: 37 cm From Medial Instep 37.2 cm cm Ankle Left: Right: Point of Measurement: 15 cm From Medial Instep 19.5 cm cm Vascular Assessment Pulses: Posterior Tibial Dorsalis Pedis Palpable: [Left:Yes] Extremity colors, hair growth, and conditions: Extremity Color: [Left:Mottled] Hair Growth on Extremity: [Left:No] Temperature of Extremity: [Left:Warm] Capillary Refill: [Left:< 3 seconds] Toe Nail Assessment Left: Right: Thick: Yes Discolored: Yes Deformed: Yes Improper Length and Hygiene: Yes Electronic Signature(s) Signed: 11/20/2014 10:59:53 AM By: Regan Lemming BSN, RN Entered By: Regan Lemming on 11/20/2014 10:59:51 [name] ([id number]) -------------------------------------------------------------------------------- Multi Wound Chart Details Patient Name: [name]. Date of Service: 11/20/2014 10:45 AM Medical Record Number: [id number] Patient Account Number: [id number] Date of Birth/Sex: [birth year] (81 y.o. Female) Treating RN: Baruch Gouty, RN, BSN, Velva Harman Primary Care Physician: Einar Pheasant Other Clinician: Referring Physician: Einar Pheasant Treating Physician/Extender: Frann Rider in Treatment: 10 Vital Signs Height(in): 67 Pulse(bpm): 79 Weight(lbs): 159 Blood Pressure 123/72 (mmHg): Body Mass Index(BMI): 25 Temperature(F): 97.7 Respiratory Rate 18 (breaths/min): Photos: [1:No Photos] [3:No Photos] [4:No Photos] Wound Location: [1:Left, Lateral Lower Leg] [3:Left  Foot - Dorsal, Medial] [4:Left, Lateral, Dorsal Foot] Wounding Event: [1:Gradually Appeared] [3:Pressure Injury] [4:Shear/Friction] Primary Etiology: [1:Arterial Insufficiency Ulcer Pressure Ulcer] [4:Pressure Ulcer] Secondary Etiology: [1:Cellulitis] [3:N/A] [4:N/A] Comorbid History: [1:N/A] [3:Cataracts, Arrhythmia, Hypertension, Peripheral Arterial Disease, Peripheral Venous Disease, Osteoarthritis, Confinement Anxiety] [4:N/A] Date Acquired: [1:08/17/2014] [3:11/12/2014] [4:11/12/2014] Weeks of Treatment: [1:10] [3:0] [4:0] Wound Status: [1:Open] [3:Open] [4:Open] Measurements L x W x D 4.7x0.5x0.1 [3:1x2x0.1] [4:1.4x1x0.1] (cm) Area (cm) : [1:1.846] [3:1.571] [4:1.1] Volume (cm) : [1:0.185] [3:0.157] [4:0.11] % Reduction in Area: [1:93.00%] [3:0.00%] [4:N/A] % Reduction in Volume: 96.50% [3:0.00%] [4:N/A] Classification: [1:Full Thickness Without Exposed Support Structures] [3:Unstageable/Unclassified] [4:N/A] Exudate Amount: [1:N/A] [3:Small] [4:N/A] Exudate Type: [1:N/A] [3:Serosanguineous] [4:N/A] Exudate Color: [1:N/A] [3:red, brown] [4:N/A] Wound Margin: [1:N/A] [3:Distinct, outline attached]  [4:N/A] Granulation Amount: [1:N/A] [3:None Present (0%)] [4:N/A] Necrotic Amount: [1:N/A] [3:Large (67-100%)] [4:N/A] Necrotic Tissue: [1:N/A] [3:Eschar] [4:N/A] Periwound Skin Texture: No Abnormalities Noted Edema: No No Abnormalities Noted Excoriation: No Induration: No Callus: No Crepitus: No Fluctuance: No Friable: No Rash: No Scarring: No Periwound Skin No Abnormalities Noted Dry/Scaly: Yes No Abnormalities Noted Moisture: Maceration: No Moist: No Periwound Skin Color: No Abnormalities Noted Atrophie Blanche: No No Abnormalities Noted Cyanosis: No Ecchymosis: No Erythema: No Hemosiderin Staining: No Mottled: No Pallor: No Rubor: No Temperature: N/A No Abnormality N/A Tenderness on No Yes No Palpation: Wound Preparation: N/A Ulcer Cleansing: N/A Rinsed/Irrigated with Saline Topical Anesthetic Applied: Other: lidocaine 4% Treatment Notes Electronic Signature(s) Signed: 11/20/2014 11:15:58 AM By: Regan Lemming BSN, RN Entered By: Regan Lemming on 11/20/2014 11:15:57 [name] ([id number]) -------------------------------------------------------------------------------- [place] Details Patient Name: [name] Date of Service: 11/20/2014 10:45 AM Medical Record Number: [id number] Patient Account Number: [id number] Date of Birth/Sex: Aug 10, 1922 (81 y.o. Female) Treating RN: Afful, RN, BSN, Velva Harman Primary Care Physician: Einar Pheasant Other Clinician: Referring Physician: Einar Pheasant Treating Physician/Extender: Frann Rider in Treatment: 10 Active Inactive Abuse / Safety / Falls / Self Care Management Nursing Diagnoses: Potential for falls Goals: Patient will remain injury free Date Initiated: 09/07/2014 Goal Status: Active Interventions: Assess fall risk on admission and as needed Notes: Nutrition Nursing Diagnoses: Potential for alteratiion in Nutrition/Potential for imbalanced  nutrition Goals: Patient/caregiver agrees to and verbalizes understanding of need to use nutritional supplements and/or vitamins as prescribed Date Initiated: 09/07/2014 Goal Status: Active Interventions: Assess patient nutrition upon admission and as needed per policy Notes: Orientation to the Wound Care Program Nursing Diagnoses: Knowledge deficit related to the wound healing center program Goals: Patient/caregiver will verbalize understanding of the [place], [place] ([id number]) Date Initiated: 09/07/2014 Goal Status: Active Interventions: Provide education on orientation to the wound center Notes: Wound/Skin Impairment Nursing Diagnoses: Impaired tissue integrity Goals: Patient/caregiver will verbalize understanding of skin care regimen Date Initiated: 09/07/2014 Goal Status: Active Ulcer/skin breakdown will heal within 14 weeks Date Initiated: 09/07/2014 Goal Status: Active Interventions: Assess patient/caregiver ability to obtain necessary supplies Assess ulceration(s) every visit Notes: Electronic Signature(s) Signed: 11/20/2014 11:14:27 AM By: Regan Lemming BSN, RN Entered By: Regan Lemming on 11/20/2014 11:14:27 [name] ([id number]) -------------------------------------------------------------------------------- Pain Assessment Details Patient Name: [name]. Date of Service: 11/20/2014 10:45 AM Medical Record Number: [id number] Patient Account Number: [id number] Date of Birth/Sex: November 03, 1922 (81 y.o. Female) Treating RN: Baruch Gouty, RN, BSN, Velva Harman Primary Care Physician: Einar Pheasant Other Clinician: Referring Physician: Einar Pheasant Treating Physician/Extender: Frann Rider in Treatment: 10 Active Problems Location of Pain Severity and Description of Pain Patient Has Paino No Site Locations Pain Management and  Medication Current Pain Management: Electronic Signature(s) Signed: 11/20/2014 10:51:23 AM By: Regan Lemming BSN, RN Entered By: Regan Lemming on 11/20/2014 10:51:23 [name] ([id number]) -------------------------------------------------------------------------------- Patient/Caregiver Education Details Patient Name: [name] Date of Service: 11/20/2014 10:45 AM Medical Record Number: [id number] Patient Account Number: [id number] Date of Birth/Gender: 1922/10/31 (81 y.o. Female) Treating RN: Afful, RN, BSN, Velva Harman Primary Care Physician: Einar Pheasant Other Clinician: Referring Physician: Einar Pheasant Treating Physician/Extender: Frann Rider in Treatment: 10 Education Assessment Education Provided To: Patient and Caregiver Education Topics Provided Welcome To [facility] [place]: Methods: Explain/Verbal Responses: State content correctly Electronic Signature(s) Signed: 11/20/2014 12:08:03 PM By: Regan Lemming BSN, RN Entered By: Regan Lemming on 11/20/2014 12:08:03 [name] ([id number]) -------------------------------------------------------------------------------- Wound Assessment Details Patient Name: [name]. Date of Service: 11/20/2014 10:45 AM Medical Record Number: [id number] Patient Account Number: [id number] Date of Birth/Sex: 11-Jan-1923 (81 y.o. Female) Treating RN: Afful, RN, BSN, [place] Primary Care Physician: Einar Pheasant Other Clinician: Referring Physician: Einar Pheasant Treating Physician/Extender: Frann Rider in Treatment: 10 Wound Status Wound Number: 1 Primary Etiology: Arterial Insufficiency Ulcer Wound Location: Left, Lateral Lower Leg Secondary Etiology: Cellulitis Wounding Event: Gradually Appeared Wound Status: Open Date Acquired: 08/17/2014 Weeks Of Treatment: 10 Clustered Wound: No Photos Photo Uploaded By: Regan Lemming on 11/20/2014 14:33:20 Wound Measurements Length: (cm) 4.7 Width: (cm) 0.5 Depth: (cm) 0.1 Area: (cm) 1.846 Volume: (cm) 0.185 % Reduction in Area: 93% % Reduction in Volume:  96.5% Wound Description Full Thickness Without Exposed Classification: Support Structures Periwound Skin Texture Texture Color No Abnormalities Noted: No No Abnormalities Noted: No Moisture No Abnormalities Noted: No Treatment Notes Wound #1 (Left, Lateral Lower Leg) [name], Kara L. ([id number]) 1. Cleansed with: Cleanse wound with antibacterial soap and water 3. Peri-wound Care: Moisturizing lotion 4. Dressing Applied: Aquacel Ag Prisma Ag 5. Secondary Dressing Applied ABD Pad 7. Secured with 3 Layer Compression System - Left Lower Extremity Notes AqAg to foot, prisma to leg. wrapped with profore lite. Cotton roll not used due to her excess dry skin. Electronic Signature(s) Signed: 11/20/2014 5:04:50 PM By: Regan Lemming BSN, RN Entered By: Regan Lemming on 11/20/2014 11:01:54 [name] ([id number]) -------------------------------------------------------------------------------- Wound Assessment Details Patient Name: [name]. Date of Service: 11/20/2014 10:45 AM Medical Record Number: [id number] Patient Account Number: [id number] Date of Birth/Sex: 1922-10-21 (81 y.o. Female) Treating RN: Afful, RN, BSN, [place] Primary Care Physician: Einar Pheasant Other Clinician: Referring Physician: Einar Pheasant Treating Physician/Extender: Frann Rider in Treatment: 10 Wound Status Wound Number: 3 Primary Pressure Ulcer Etiology: Wound Location: Left Foot - Dorsal, Medial Wound Open Wounding Event: Pressure Injury Status: Date Acquired: 11/12/2014 Comorbid Cataracts, Arrhythmia, Hypertension, Weeks Of Treatment: 0 History: Peripheral Arterial Disease, Peripheral Clustered Wound: No Venous Disease, Osteoarthritis, Confinement Anxiety Photos Photo Uploaded By: Regan Lemming on 11/20/2014 14:33:23 Wound Measurements Length: (cm) 1 Width: (cm) 2 Depth: (cm) 0.1 Area: (cm) 1.571 Volume: (cm) 0.157 % Reduction in Area: 0% % Reduction in Volume:  0% Tunneling: No Undermining: No Wound Description Classification: Unstageable/Unclassified Wound Margin: Distinct, outline attached Exudate Amount: Small Exudate Type: Serosanguineous Exudate Color: red, brown Foul Odor After Cleansing: No Wound Bed Granulation Amount: None Present (0%) Exposed Structure Necrotic Amount: Large (67-100%) Fascia Exposed: No Necrotic Quality: Eschar Fat Layer Exposed: No [name], Kara L. ([id number]) Tendon Exposed: No Muscle Exposed: No Joint Exposed: No Bone Exposed: No Limited to Skin Breakdown Periwound Skin Texture Texture Color No Abnormalities Noted: No No Abnormalities Noted: No Callus:  No Atrophie Blanche: No Crepitus: No Cyanosis: No Excoriation: No Ecchymosis: No Fluctuance: No Erythema: No Friable: No Hemosiderin Staining: No Induration: No Mottled: No Localized Edema: No Pallor: No Rash: No Rubor: No Scarring: No Temperature / Pain Moisture Temperature: No Abnormality No Abnormalities Noted: No Tenderness on Palpation: Yes Dry / Scaly: Yes Maceration: No Moist: No Wound Preparation Ulcer Cleansing: Rinsed/Irrigated with Saline Topical Anesthetic Applied: Other: lidocaine 4%, Treatment Notes Wound #3 (Left, Medial, Dorsal Foot) 1. Cleansed with: Cleanse wound with antibacterial soap and water 3. Peri-wound Care: Moisturizing lotion 4. Dressing Applied: Aquacel Ag Prisma Ag 5. Secondary Dressing Applied ABD Pad 7. Secured with 3 Layer Compression System - Left Lower Extremity Notes AqAg to foot, prisma to leg. wrapped with profore lite. Cotton roll not used due to her excess dry skin. Electronic Signature(s) Signed: 11/20/2014 11:12:02 AM By: Regan Lemming BSN, RN [name], [name] ([id number]) Entered By: Regan Lemming on 11/20/2014 11:12:01 [name] ([id number]) -------------------------------------------------------------------------------- Wound Assessment Details Patient Name: [name]. Date of Service: 11/20/2014 10:45 AM Medical Record Number: [id number] Patient Account Number: [id number] Date of Birth/Sex: Jul 09, 1922 (81 y.o. Female) Treating RN: Afful, RN, BSN, Velva Harman Primary Care Physician: Einar Pheasant Other Clinician: Referring Physician: Einar Pheasant Treating Physician/Extender: Frann Rider in Treatment: 10 Wound Status Wound Number: 4 Primary Etiology: Pressure Ulcer Wound Location: Left, Lateral, Dorsal Foot Wound Status: Open Wounding Event: Shear/Friction Date Acquired: 11/12/2014 Weeks Of Treatment: 0 Clustered Wound: No Photos Photo Uploaded By: Regan Lemming on 11/20/2014 14:33:51 Wound Measurements Length: (cm) Width: (cm) Depth: (cm) Area: (cm) Volume: (cm) 1.4 % Reduction in Area: 1 % Reduction in Volume: 0.1 1.1 0.11 Periwound Skin Texture Texture Color No Abnormalities Noted: No No Abnormalities Noted: No Moisture No Abnormalities Noted: No Treatment Notes Wound #4 (Left, Lateral, Dorsal Foot) 1. Cleansed with: Cleanse wound with antibacterial soap and water 3. Peri-wound Care: [name], [name] ([id number]) Moisturizing lotion 4. Dressing Applied: Aquacel Ag Prisma Ag 5. Secondary Dressing Applied ABD Pad 7. Secured with 3 Layer Compression System - Left Lower Extremity Notes AqAg to foot, prisma to leg. wrapped with profore lite. Cotton roll not used due to her excess dry skin. Electronic Signature(s) Signed: 11/20/2014 5:04:50 PM By: Regan Lemming BSN, RN Entered By: Regan Lemming on 11/20/2014 11:09:41 [name] ([id number]) -------------------------------------------------------------------------------- Vitals Details Patient Name: [name] Date of Service: 11/20/2014 10:45 AM Medical Record Number: [id number] Patient Account Number: [id number] Date of Birth/Sex: [birth year] (81 y.o. Female) Treating RN: Afful, RN, BSN, Velva Harman Primary Care Physician: Einar Pheasant Other  Clinician: Referring Physician: Einar Pheasant Treating Physician/Extender: Frann Rider in Treatment: 10 Vital Signs Time Taken: 10:52 Temperature (F): 97.7 Height (in): 67 Pulse (bpm): 79 Weight (lbs): 159 Respiratory Rate (breaths/min): 18 Body Mass Index (BMI): 24.9 Blood Pressure (mmHg): 123/72 Reference Range: 80 - 120 mg / dl Electronic Signature(s) Signed: 11/20/2014 10:53:47 AM By: Regan Lemming BSN, RN Entered By: Regan Lemming on 11/20/2014 10:53:47

## 2014-11-21 ENCOUNTER — Telehealth: Payer: Self-pay | Admitting: Internal Medicine

## 2014-11-21 NOTE — Telephone Encounter (Signed)
[name] [phone] called from [facility] regarding verbal order for 2w2. She says leave it on her voice mail. Thank You!

## 2014-11-21 NOTE — Telephone Encounter (Signed)
[name] faxed order today to them.

## 2014-11-22 ENCOUNTER — Telehealth: Payer: Self-pay | Admitting: Internal Medicine

## 2014-11-22 NOTE — Telephone Encounter (Signed)
Please advise 

## 2014-11-22 NOTE — Telephone Encounter (Signed)
Dr. Nicki Reaper call company & notified them again that the order has been faxed three times now.

## 2014-11-22 NOTE — Telephone Encounter (Signed)
[name] called [facility] stating to call [name] [phone] and get  verbal order for OT 2 times a week for 2 weeks. Thank You!

## 2014-11-27 ENCOUNTER — Encounter: Payer: Medicare Other | Admitting: Surgery

## 2014-11-27 DIAGNOSIS — L97222 Non-pressure chronic ulcer of left calf with fat layer exposed: Secondary | ICD-10-CM | POA: Diagnosis not present

## 2014-11-27 NOTE — Progress Notes (Signed)
[name], [name] ([id number]) Visit Report for 11/27/2014 Arrival Information Details Patient Name: [name], [name]. Date of Service: 11/27/2014 10:00 AM Medical Record Number: [id number] Patient Account Number: [id number] Date of Birth/Sex: [name][birth year] (81 y.o. Female) Treating RN: Afful, RN, BSN, Velva Harman Primary Care Physician: Einar Pheasant Other Clinician: Referring Physician: Einar Pheasant Treating Physician/Extender: Frann Rider in Treatment: 11 Visit Information History Since Last Visit Added or deleted any medications: No Patient Arrived: Wheel Chair Any new allergies or adverse reactions: No Arrival Time: 09:56 Had a fall or experienced change in No Accompanied By: dtr activities of daily [name] that Kara affect Transfer Assistance: None risk of falls: Patient Identification Verified: Yes Signs or symptoms of abuse/neglect since last No Secondary Verification Process Yes visito Completed: Hospitalized since last visit: No Patient Requires Transmission- No Has Dressing in Place as Prescribed: Yes Based Precautions: Pain Present Now: No Patient Has Alerts: Yes Patient Alerts: Patient on Blood Thinner Eliquis. R ABI:0.96 Electronic Signature(s) Signed: 11/27/2014 9:56:23 AM By: Regan Lemming BSN, RN Entered By: Regan Lemming on 11/27/2014 09:56:23 [name] ([id number]) -------------------------------------------------------------------------------- Encounter Discharge Information Details Patient Name: [name]. Date of Service: 11/27/2014 10:00 AM Medical Record Number: [id number] Patient Account Number: [id number] Date of Birth/Sex: [name][birth year] (81 y.o. Female) Treating RN: Afful, RN, BSN, Velva Harman Primary Care Physician: Einar Pheasant Other Clinician: Referring Physician: Einar Pheasant Treating Physician/Extender: Frann Rider in Treatment: 11 Encounter Discharge Information Items Discharge Pain Level: 0 Discharge Condition:  Stable Ambulatory Status: Wheelchair Discharge Destination: Home Transportation: Private Auto Accompanied By: dtr Schedule Follow-up Appointment: No Medication Reconciliation completed and provided to Patient/Care No [name]: Provided on Clinical Summary of Care: 11/27/2014 Form Type Recipient Paper Patient LS Electronic Signature(s) Signed: 11/27/2014 11:05:04 AM By: Regan Lemming BSN, RN Previous Signature: 11/27/2014 11:02:28 AM Version By: Ruthine Dose Entered By: Regan Lemming on 11/27/2014 11:05:04 [name] ([id number]) -------------------------------------------------------------------------------- Lower Extremity Assessment Details Patient Name: [name]. Date of Service: 11/27/2014 10:00 AM Medical Record Number: [id number] Patient Account Number: [id number] Date of Birth/Sex: [birth year][name] (81 y.o. Female) Treating RN: Afful, RN, BSN, Velva Harman Primary Care Physician: Einar Pheasant Other Clinician: Referring Physician: Einar Pheasant Treating Physician/Extender: Frann Rider in Treatment: 11 Edema Assessment Assessed: [Left: No] [Right: No] E[Left: dema] [Right: :] Calf Left: Right: Point of Measurement: 37 cm From Medial Instep 37.4 cm cm Ankle Left: Right: Point of Measurement: 15 cm From Medial Instep 19.5 cm cm Vascular Assessment Pulses: Posterior Tibial Dorsalis Pedis Palpable: [Left:Yes] Extremity colors, hair growth, and conditions: Extremity Color: [Left:Mottled] Hair Growth on Extremity: [Left:No] Temperature of Extremity: [Left:Warm] Capillary Refill: [Left:< 3 seconds] Electronic Signature(s) Signed: 11/27/2014 9:57:03 AM By: Regan Lemming BSN, RN Entered By: Regan Lemming on 11/27/2014 09:57:03 [name] ([id number]) -------------------------------------------------------------------------------- Multi Wound Chart Details Patient Name: [name]. Date of Service: 11/27/2014 10:00 AM Medical Record Number:  [id number] Patient Account Number: [id number] Date of Birth/Sex: [name], [name] (81 y.o. Female) Treating RN: Baruch Gouty, RN, BSN, Velva Harman Primary Care Physician: Einar Pheasant Other Clinician: Referring Physician: Einar Pheasant Treating Physician/Extender: Frann Rider in Treatment: 11 Vital Signs Height(in): 67 Pulse(bpm): 83 Weight(lbs): 159 Blood Pressure 110/64 (mmHg): Body Mass Index(BMI): 25 Temperature(F): 97.6 Respiratory Rate 18 (breaths/min): Photos: [1:No Photos] [3:No Photos] [4:No Photos] Wound Location: [1:Left, Lateral Lower Leg] [3:Left, Medial, Dorsal Foot] [4:Left, Lateral, Dorsal Foot] Wounding Event: [1:Gradually Appeared] [3:Pressure Injury] [4:Shear/Friction] Primary Etiology: [1:Arterial Insufficiency Ulcer Pressure Ulcer] [4:Pressure Ulcer] Secondary Etiology: [1:Cellulitis] [3:[name]] [4:[name]] Date Acquired: [1:08/17/2014] [3:11/12/2014] [4:11/12/2014] Weeks  of Treatment: [1:11] [3:1] [4:1] Wound Status: [1:Open] [3:Open] [4:Open] Measurements L x W x D 4x0.5x0.1 [3:0.9x1.7x0.1] [4:0.5x1.3x0.1] (cm) Area (cm) : [1:1.571] [3:1.202] [4:0.511] Volume (cm) : [1:0.157] [3:0.12] [4:0.051] % Reduction in Area: [1:94.00%] [3:23.50%] [4:53.50%] % Reduction in Volume: 97.00% [3:23.60%] [4:53.60%] Classification: [1:Full Thickness Without Exposed Support Structures] [3:Unstageable/Unclassified] [4:[name]] Debridement: [1:Open Wound/Selective ([id number]) - Selective] [3:[name]] [4:[name]] Time-Out Taken: [1:Yes] [3:[name]] [4:[name]] Pain Control: [1:Lidocaine 4% Topical Solution] [3:[name]] [4:[name]] Tissue Debrided: [1:Skin] [3:[name]] [4:[name]] Level: [1:Non-Viable Tissue] [3:[name]] [4:[name]] Debridement Area (sq [1:2] [3:[name]] [4:[name]] cm): Instrument: [1:Forceps] [3:[name]] [4:[name]] Bleeding: [1:Minimum] [3:[name]] [4:[name]] Hemostasis Achieved: Pressure [3:[name]] [4:[name]] Procedural Pain: [1:0] [3:[name]] [4:[name]] Post Procedural Pain: 0 [name] [name] Debridement Treatment Procedure was tolerated [name]  [name] Response: well Post Debridement 4x0.5x0.1 [name] [name] Measurements L x W x D (cm) Post Debridement 0.157 [name] [name] Volume: (cm) Periwound Skin Texture: No Abnormalities Noted No Abnormalities Noted No Abnormalities Noted Periwound Skin No Abnormalities Noted No Abnormalities Noted No Abnormalities Noted Moisture: Periwound Skin Color: No Abnormalities Noted No Abnormalities Noted No Abnormalities Noted Tenderness on No No No Palpation: Procedures Performed: Debridement [name] [name] Treatment Notes Electronic Signature(s) Signed: 11/27/2014 10:48:39 AM By: Regan Lemming BSN, RN Entered By: Regan Lemming on 11/27/2014 10:48:39 [name] ([id number]) -------------------------------------------------------------------------------- [place] Details Patient Name: [name]. Date of Service: 11/27/2014 10:00 AM Medical Record Number: [id number] Patient Account Number: [id number] Date of Birth/Sex: [birth year] [name] (81 y.o. Female) Treating RN: Afful, RN, BSN, Velva Harman Primary Care Physician: Einar Pheasant Other Clinician: Referring Physician: Einar Pheasant Treating Physician/Extender: Frann Rider in Treatment: 11 Active Inactive Abuse / Safety / Falls / Self Care Management Nursing Diagnoses: Potential for falls Goals: Patient will remain injury free Date Initiated: 09/07/2014 Goal Status: Active Interventions: Assess fall risk on admission and as needed Notes: Nutrition Nursing Diagnoses: Potential for alteratiion in Nutrition/Potential for imbalanced nutrition Goals: Patient/caregiver agrees to and verbalizes understanding of need to use nutritional supplements and/or vitamins as prescribed Date Initiated: 09/07/2014 Goal Status: Active Interventions: Assess patient nutrition upon admission and as needed per policy Notes: Orientation to the Wound Care Program Nursing Diagnoses: Knowledge deficit related to the wound healing center  program Goals: Patient/caregiver will verbalize understanding of the [place], [place] ([id number]) Date Initiated: 09/07/2014 Goal Status: Active Interventions: Provide education on orientation to the wound center Notes: Wound/Skin Impairment Nursing Diagnoses: Impaired tissue integrity Goals: Patient/caregiver will verbalize understanding of skin care regimen Date Initiated: 09/07/2014 Goal Status: Active Ulcer/skin breakdown will heal within 14 weeks Date Initiated: 09/07/2014 Goal Status: Active Interventions: Assess patient/caregiver ability to obtain necessary supplies Assess ulceration(s) every visit Notes: Electronic Signature(s) Signed: 11/27/2014 10:48:29 AM By: Regan Lemming BSN, RN Entered By: Regan Lemming on 11/27/2014 10:48:29 [name] ([id number]) -------------------------------------------------------------------------------- Pain Assessment Details Patient Name: [name]. Date of Service: 11/27/2014 10:00 AM Medical Record Number: [id number] Patient Account Number: [id number] Date of Birth/Sex: [name][birth year] (81 y.o. Female) Treating RN: Baruch Gouty, RN, BSN, Velva Harman Primary Care Physician: Einar Pheasant Other Clinician: Referring Physician: Einar Pheasant Treating Physician/Extender: Frann Rider in Treatment: 11 Active Problems Location of Pain Severity and Description of Pain Patient Has Paino No Site Locations Pain Management and Medication Current Pain Management: Electronic Signature(s) Signed: 11/27/2014 9:56:32 AM By: Regan Lemming BSN, RN Entered By: Regan Lemming on 11/27/2014 09:56:31 [name] ([id number]) -------------------------------------------------------------------------------- Patient/Caregiver Education Details Patient Name: [name] Date of Service: 11/27/2014 10:00 AM Medical Record Number: [id number] Patient Account Number: [id number] Date  of Birth/Gender: [name][birth year] (81 y.o.  Female) Treating RN: Afful, RN, BSN, Velva Harman Primary Care Physician: Einar Pheasant Other Clinician: Referring Physician: Einar Pheasant Treating Physician/Extender: Frann Rider in Treatment: 11 Education Assessment Education Provided To: Patient Education Topics Provided Basic Hygiene: Methods: Explain/Verbal Welcome To The [facility]: Methods: Explain/Verbal Responses: State content correctly Electronic Signature(s) Signed: 11/27/2014 11:05:18 AM By: Regan Lemming BSN, RN Entered By: Regan Lemming on 11/27/2014 11:05:18 [name] ([id number]) -------------------------------------------------------------------------------- Wound Assessment Details Patient Name: [name]. Date of Service: 11/27/2014 10:00 AM Medical Record Number: [id number] Patient Account Number: [id number] Date of Birth/Sex: [name][birth year] (81 y.o. Female) Treating RN: Afful, RN, BSN, Velva Harman Primary Care Physician: Einar Pheasant Other Clinician: Referring Physician: Einar Pheasant Treating Physician/Extender: Frann Rider in Treatment: 11 Wound Status Wound Number: 1 Primary Etiology: Arterial Insufficiency Ulcer Wound Location: Left, Lateral Lower Leg Secondary Etiology: Cellulitis Wounding Event: Gradually Appeared Wound Status: Open Date Acquired: 08/17/2014 Weeks Of Treatment: 11 Clustered Wound: No Wound Measurements Length: (cm) 4 Width: (cm) 0.5 Depth: (cm) 0.1 Area: (cm) 1.571 Volume: (cm) 0.157 % Reduction in Area: 94% % Reduction in Volume: 97% Wound Description Full Thickness Without Exposed Classification: Support Structures Periwound Skin Texture Texture Color No Abnormalities Noted: No No Abnormalities Noted: No Moisture No Abnormalities Noted: No Treatment Notes Wound #1 (Left, Lateral Lower Leg) 1. Cleansed with: Cleanse wound with antibacterial soap and water 3. Peri-wound Care: Moisturizing lotion 4. Dressing Applied: Aquacel Ag 5.  Secondary Dressing Applied ABD Pad 7. Secured with 3 Layer Compression System - Left Lower Extremity Notes [name], Kara L. ([id number]) AqAg to foot, Mepitel to leg. wrapped with profore lite. Electronic Signature(s) Unsigned Entered By: Regan Lemming on 11/27/2014 10:10:13 Signature(s): Date(s): [name] ([id number]) -------------------------------------------------------------------------------- Wound Assessment Details Patient Name: [name], THREATS. Date of Service: 11/27/2014 10:00 AM Medical Record Number: [id number] Patient Account Number: [id number] Date of Birth/Sex: [birth year] (81 y.o. Female) Treating RN: Afful, RN, BSN, Velva Harman Primary Care Physician: Einar Pheasant Other Clinician: Referring Physician: Einar Pheasant Treating Physician/Extender: Frann Rider in Treatment: 11 Wound Status Wound Number: 3 Primary Etiology: Pressure Ulcer Wound Location: Left, Medial, Dorsal Foot Wound Status: Open Wounding Event: Pressure Injury Date Acquired: 11/12/2014 Weeks Of Treatment: 1 Clustered Wound: No Wound Measurements Length: (cm) 0.9 Width: (cm) 1.7 Depth: (cm) 0.1 Area: (cm) 1.202 Volume: (cm) 0.12 % Reduction in Area: 23.5% % Reduction in Volume: 23.6% Wound Description Classification: Unstageable/Unclassified Periwound Skin Texture Texture Color No Abnormalities Noted: No No Abnormalities Noted: No Moisture No Abnormalities Noted: No Treatment Notes Wound #3 (Left, Medial, Dorsal Foot) 1. Cleansed with: Cleanse wound with antibacterial soap and water 3. Peri-wound Care: Moisturizing lotion 4. Dressing Applied: Aquacel Ag 5. Secondary Dressing Applied ABD Pad 7. Secured with 3 Layer Compression System - Left Lower Extremity Notes AqAg to foot, Mepitel to leg. wrapped with profore lite. [name], [name] ([id number]) Electronic Signature(s) Unsigned Entered By: Regan Lemming on 11/27/2014 10:10:13 Signature(s): Date(s): [name] ([id number]) -------------------------------------------------------------------------------- Wound Assessment Details Patient Name: [name], VANDERZANDEN. Date of Service: 11/27/2014 10:00 AM Medical Record Number: [id number] Patient Account Number: [id number] Date of Birth/Sex: [birth year][name] (81 y.o. Female) Treating RN: Afful, RN, BSN, Velva Harman Primary Care Physician: Einar Pheasant Other Clinician: Referring Physician: Einar Pheasant Treating Physician/Extender: Frann Rider in Treatment: 11 Wound Status Wound Number: 4 Primary Etiology: Pressure Ulcer Wound Location: Left, Lateral, Dorsal Foot Wound Status: Open Wounding Event: Shear/Friction Date Acquired: 11/12/2014 Weeks Of Treatment: 1 Clustered Wound: No  Wound Measurements Length: (cm) 0.5 Width: (cm) 1.3 Depth: (cm) 0.1 Area: (cm) 0.511 Volume: (cm) 0.051 % Reduction in Area: 53.5% % Reduction in Volume: 53.6% Periwound Skin Texture Texture Color No Abnormalities Noted: No No Abnormalities Noted: No Moisture No Abnormalities Noted: No Treatment Notes Wound #4 (Left, Lateral, Dorsal Foot) 1. Cleansed with: Cleanse wound with antibacterial soap and water 3. Peri-wound Care: Moisturizing lotion 4. Dressing Applied: Aquacel Ag 5. Secondary Dressing Applied ABD Pad 7. Secured with 3 Layer Compression System - Left Lower Extremity Notes AqAg to foot, Mepitel to leg. wrapped with profore lite. Electronic Signature(s) [name], [name] ([id number]) Entered By: Regan Lemming on 11/27/2014 10:10:13 Signature(s): Date(s): [name] ([id number]) -------------------------------------------------------------------------------- Vitals Details Patient Name: [name] Date of Service: 11/27/2014 10:00 AM Medical Record Number: [id number] Patient Account Number: [id number] Date of Birth/Sex: [birth year] (81 y.o. Female) Treating RN: Afful, RN, BSN, Velva Harman Primary Care Physician: Einar Pheasant Other Clinician: Referring Physician: Einar Pheasant Treating Physician/Extender: Frann Rider in Treatment: 11 Vital Signs Time Taken: 10:00 Temperature (F): 97.6 Height (in): 67 Pulse (bpm): 83 Weight (lbs): 159 Respiratory Rate (breaths/min): 18 Body Mass Index (BMI): 24.9 Blood Pressure (mmHg): 110/64 Reference Range: 80 - 120 mg / dl Electronic Signature(s) Signed: 11/27/2014 10:01:12 AM By: Regan Lemming BSN, RN Entered By: Regan Lemming on 11/27/2014 10:01:11

## 2014-11-28 NOTE — Progress Notes (Signed)
[name], [name] ([id number]) Visit Report for 11/27/2014 Chief Complaint Document Details Patient Name: [name], [name] 11/27/2014 10:00 Date of Service: AM Medical Record [id number] Number: Patient Account Number: [id number] 1922-09-14 (81 y.o. Treating RN: Afful, RN, BSN, Velva Harman Date of Birth/Sex: Female) Other Clinician: Primary Care Physician: Einar Pheasant Treating [name] Referring Physician: Einar Pheasant Physician/Extender: Weeks in Treatment: 11 Information Obtained from: Patient Chief Complaint Patient presents to the wound care center today with an open arterial ulcer. The patient has had left lower extremity ulceration for about 1 month. Electronic Signature(s) Signed: 11/27/2014 11:01:10 AM By: Christin Fudge MD, FACS Entered By: Christin Fudge on 11/27/2014 11:01:09 [name] ([id number]) -------------------------------------------------------------------------------- Debridement Details Patient Name: [name] 11/27/2014 10:00 Date of Service: AM Medical Record [id number] Number: Patient Account Number: [id number] 01-22-1923 (81 y.o. Treating RN: Baruch Gouty, RN, BSN, Velva Harman Date of Birth/Sex: Female) Other Clinician: Primary Care Physician: Einar Pheasant Treating [name] Referring Physician: Einar Pheasant Physician/Extender: Weeks in Treatment: 11 Debridement Performed for Wound #1 Left,Lateral Lower Leg Assessment: Performed By: Physician Christin Fudge, MD Debridement: Open Wound/Selective Debridement Selective Description: Pre-procedure Yes Verification/Time Out Taken: Start Time: 10:42 Pain Control: Lidocaine 4% Topical Solution Level: Non-Viable Tissue Total Area Debrided (L x 4 (cm) x 0.5 (cm) = 2 (cm) W): Tissue and other Non-Viable, Eschar, Exudate, Fibrin/Slough, Skin material debrided: Instrument: Forceps Bleeding: Minimum Hemostasis Achieved: Pressure End Time: 10:47 Procedural Pain: 0 Post Procedural  Pain: 0 Response to Treatment: Procedure was tolerated well Post Debridement Measurements of Total Wound Length: (cm) 4 Width: (cm) 0.5 Depth: (cm) 0.1 Volume: (cm) 0.157 Post Procedure Diagnosis Same as Pre-procedure Electronic Signature(s) Signed: 11/27/2014 11:01:01 AM By: Christin Fudge MD, FACS Signed: 11/27/2014 4:50:29 PM By: Regan Lemming BSN, RN Previous Signature: 11/27/2014 10:48:19 AM Version By: Regan Lemming BSN, RN [name], [name] ([id number]) Entered By: Christin Fudge on 11/27/2014 11:01:01 [name] ([id number]) -------------------------------------------------------------------------------- HPI Details Patient Name: [name], [name] 11/27/2014 10:00 Date of Service: AM Medical Record [id number] Number: Patient Account Number: [id number] 10/16/1922 (81 y.o. Treating RN: Afful, RN, BSN, Velva Harman Date of Birth/Sex: Female) Other Clinician: Primary Care Physician: Einar Pheasant Treating [name] Referring Physician: Einar Pheasant Physician/Extender: Weeks in Treatment: 11 History of Present Illness Location: left lower extremity ulceration Quality: Patient reports experiencing burning to affected area(s). Severity: Patient states wound are getting worse. Duration: Patient has had the wound for < 4 weeks prior to presenting for treatment Timing: Pain in wound is constant (hurts all the time) Context: The wound appeared gradually over time Modifying Factors: Other treatment(s) tried include:has been on oral anti-buttocks since the beginning of the month and had 10 days of Augmentin and now is on doxycycline. Associated Signs and Symptoms: Patient reports having difficulty standing for long periods. HPI Description: 81 year old patient was admitted to [facility] between 08/13/2014 and 08/17/2014 with cellulitis of the left lower extremity.duplex study done then showed no DVT and the patient was on IV vancomycin and Rocephin and  also received Unasyn later. The patient was sent home on 10 days of Augmentin. Her other comorbidities of atrial fibrillation, essential hypertension, diabetes mellitus type 2, coronary artery disease were managed with appropriate medications. on 09/04/2014 she was seen in the ER and was found to have a 10 cm ulceration on the left lower extremity lateral and superior to the lateral malleolus. The granulation tissue was noted and she was referred to the wound center. the ER notes that her ABIs were within normal  limits and she was sent home on consultative management. a duplex scan of the lower extremities was done by the nursing home and this showed that the right ABI was 0.96 with a multiphasic waveform throughout the right leg. There was a monophasic waveform of the left leg suggesting left external iliac artery stenosis with blunting distally and the ABI could not be calculated on the left side. Moderate to severe ischemia was suspected of the left leg and a MR angiogram was recommended. 09/17/2014 -- Xray Left tibia and fibula --- unremarkable tibia and fibula Her wound culture is back and this has grown a heavy growth of staph aureus sensitive to ciprofloxacin and clindamycin and Bactrim. She has been on doxycycline since July 14 for 10 days and we will review her clinically before switching her antibiotics if need be. After reviewing her chart she is not going to be here till 09/17/2014 and hence I will call in Bactrim DS 1 twice a day for 14 days. She has seen Dr. Lucky Cowboy for an opinion and she is scheduled a procedure on this coming Thursday. other than that the pain and swelling continues and she has no other acute problems. 09/25/2014 -- she had a recent procedure done by Dr. Lucky Cowboy for diffuse SFA stenosis, popliteal occlusion and tibial disease. she had at angioplasty of the left peroneal artery and tibioperoneal trunk, left superficial [name], Kara L. ([id number]) femoral artery, left  popliteal artery, distal SFA and popliteal artery. the patient is experiencing some swelling of the left lower extremity after the procedure. 10/09/2014 -- she has an appointment to see her vascular surgeon again this weekend for possibly a vascular Doppler. No other issues. 10/23/2014 -- The patient was recently seen by Dr. Lucky Cowboy on this past Friday. We do not have his reports but we do know that he was pleased with her progress and did not plan any further procedure in the near future. 10/30/2014 - we have received Dr. Bunnie Domino evaluation and he has mentioned that the duplex done showed a stenosis versus a lesion in the distal popliteal artery and tibioperoneal trunk in an area below her previous intervention and the overall flow was significantly better and the stent itself was patent. Her perfusion has improved but it was not optimized. Since her wound was improving he did not recommend any angiogram at this point. However if her wound worsens or infection develops he would recommend an angiogram and further evaluation to improve her perfusion. He is going to see her back in 6-8 weeks. 11/20/2014 -- she was not here last week because of her illness and is here to see as after 2 weeks. Her daughter has checked with her insurance company and she will have no out-of-pocket expense for her skin substitute. Electronic Signature(s) Signed: 11/27/2014 11:01:57 AM By: Christin Fudge MD, FACS Entered By: Christin Fudge on 11/27/2014 11:01:57 [name] ([id number]) -------------------------------------------------------------------------------- Physical Exam Details Patient Name: [name], [name] 11/27/2014 10:00 Date of Service: AM Medical Record [id number] Number: Patient Account Number: [id number] [birth year] (81 y.o. Treating RN: Baruch Gouty, RN, BSN, Velva Harman Date of Birth/Sex: Female) Other Clinician: Primary Care Physician: Einar Pheasant Treating Valina Maes Referring Physician: Einar Pheasant Physician/Extender: Weeks in Treatment: 11 Constitutional . Pulse regular. Respirations normal and unlabored. Afebrile. . Eyes Nonicteric. Reactive to light. Ears, Nose, Mouth, and Throat Lips, teeth, and gums WNL.Marland Kitchen Moist mucosa without lesions . Neck supple and nontender. No palpable supraclavicular or cervical adenopathy. Normal sized without goiter. Respiratory WNL. No retractions.. Cardiovascular  Pedal Pulses WNL. No clubbing, cyanosis or edema. Lymphatic No adneopathy. No adenopathy. No adenopathy. Musculoskeletal Adexa without tenderness or enlargement.. Digits and nails w/o clubbing, cyanosis, infection, petechiae, ischemia, or inflammatory conditions.. Integumentary (Hair, Skin) No suspicious lesions. No crepitus or fluctuance. No peri-wound warmth or erythema. No masses.Marland Kitchen Psychiatric Judgement and insight Intact.. No evidence of depression, anxiety, or agitation.. Notes The wound on the left lower extremity is looking much cleaner and there is good epithelialization. The one on the dorsum of the foot has some slough and this was sharply debrided. Electronic Signature(s) Signed: 11/27/2014 11:02:40 AM By: Christin Fudge MD, FACS Entered By: Christin Fudge on 11/27/2014 11:02:39 [name] ([id number]) -------------------------------------------------------------------------------- Physician Orders Details Patient Name: [name], [name] 11/27/2014 10:00 Date of Service: AM Medical Record [id number] Number: Patient Account Number: [id number] 04-Apr-1922 (81 y.o. Treating RN: Baruch Gouty, RN, BSN, Velva Harman Date of Birth/Sex: Female) Other Clinician: Primary Care Physician: Einar Pheasant Treating Lasean Gorniak Referring Physician: Einar Pheasant Physician/Extender: Weeks in Treatment: 11 Verbal / Phone Orders: Yes Clinician: Afful, RN, BSN, Rita Read Back and Verified: Yes Diagnosis Coding Wound Cleansing Wound #1 Left,Lateral Lower Leg o May Shower,  gently pat wound dry prior to applying new dressing. o May shower with protection. o No tub bath. Wound #3 Left,Medial,Dorsal Foot o May Shower, gently pat wound dry prior to applying new dressing. o May shower with protection. o No tub bath. Wound #4 Left,Lateral,Dorsal Foot o May Shower, gently pat wound dry prior to applying new dressing. o May shower with protection. o No tub bath. Anesthetic Wound #1 Left,Lateral Lower Leg o Topical Lidocaine 4% cream applied to wound bed prior to debridement Wound #3 Left,Medial,Dorsal Foot o Topical Lidocaine 4% cream applied to wound bed prior to debridement Wound #4 Left,Lateral,Dorsal Foot o Topical Lidocaine 4% cream applied to wound bed prior to debridement Skin Barriers/Peri-Wound Care Wound #1 Left,Lateral Lower Leg o Barrier cream Wound #3 Left,Medial,Dorsal Foot o Barrier cream Wound #4 Left,Lateral,Dorsal Foot o Barrier cream [name], Kara L. ([id number]) Primary Wound Dressing Wound #1 Left,Lateral Lower Leg o Mepitel One Wound #3 Left,Medial,Dorsal Foot o Aquacel Ag Wound #4 Left,Lateral,Dorsal Foot o Aquacel Ag Secondary Dressing Wound #1 Left,Lateral Lower Leg o ABD pad Wound #3 Left,Medial,Dorsal Foot o ABD pad Wound #4 Left,Lateral,Dorsal Foot o ABD pad Dressing Change Frequency Wound #1 Left,Lateral Lower Leg o Change dressing every week Follow-up Appointments Wound #1 Left,Lateral Lower Leg o Return Appointment in 1 week. Edema Control Wound #1 Left,Lateral Lower Leg o 3 Layer Compression System - Left Lower Extremity - Profore lite used. o Elevate legs to the level of the heart and pump ankles as often as possible o Other: - Monitor toes for discoloration;blue/purple Electronic Signature(s) Signed: 11/27/2014 10:49:19 AM By: Regan Lemming BSN, RN Signed: 11/27/2014 4:43:21 PM By: Christin Fudge MD, FACS Entered By: Regan Lemming on 11/27/2014 10:49:19 [name] ([id number]) -------------------------------------------------------------------------------- Problem List Details Patient Name: [name], [name] 11/27/2014 10:00 Date of Service: AM Medical Record [id number] Number: Patient Account Number: [id number] 11/16/1922 (81 y.o. Treating RN: Baruch Gouty, RN, BSN, Velva Harman Date of Birth/Sex: Female) Other Clinician: Primary Care Physician: Einar Pheasant Treating Anastasios Melander Referring Physician: Einar Pheasant Physician/Extender: Weeks in Treatment: 11 Active Problems ICD-10 Encounter Code Description Active Date Diagnosis I70.248 Atherosclerosis of native arteries of left leg with ulceration 09/07/2014 Yes of other part of lower left leg L97.222 Non-pressure chronic ulcer of left calf with fat layer 09/07/2014 Yes exposed L03.116 Cellulitis of left lower limb 09/07/2014  Yes Inactive Problems Resolved Problems Electronic Signature(s) Signed: 11/27/2014 10:59:59 AM By: Christin Fudge MD, FACS Entered By: Christin Fudge on 11/27/2014 10:59:58 [name] ([id number]) -------------------------------------------------------------------------------- Progress Note Details Patient Name: [name], [name] 11/27/2014 10:00 Date of Service: AM Medical Record [id number] Number: Patient Account Number: [id number] [birth year] (81 y.o. Treating RN: Afful, RN, BSN, Velva Harman Date of Birth/Sex: Female) Other Clinician: Primary Care Physician: Einar Pheasant Treating Dondi Aime Referring Physician: Einar Pheasant Physician/Extender: Weeks in Treatment: 11 Subjective Chief Complaint Information obtained from Patient Patient presents to the wound care center today with an open arterial ulcer. The patient has had left lower extremity ulceration for about 1 month. History of Present Illness (HPI) The following HPI elements were documented for the patient's wound: Location: left lower extremity ulceration Quality: Patient reports  experiencing burning to affected area(s). Severity: Patient states wound are getting worse. Duration: Patient has had the wound for < 4 weeks prior to presenting for treatment Timing: Pain in wound is constant (hurts all the time) Context: The wound appeared gradually over time Modifying Factors: Other treatment(s) tried include:has been on oral anti-buttocks since the beginning of the month and had 10 days of Augmentin and now is on doxycycline. Associated Signs and Symptoms: Patient reports having difficulty standing for long periods. 81 year old patient was admitted to [facility] between 08/13/2014 and 08/17/2014 with cellulitis of the left lower extremity.duplex study done then showed no DVT and the patient was on IV vancomycin and Rocephin and also received Unasyn later. The patient was sent home on 10 days of Augmentin. Her other comorbidities of atrial fibrillation, essential hypertension, diabetes mellitus type 2, coronary artery disease were managed with appropriate medications. on 09/04/2014 she was seen in the ER and was found to have a 10 cm ulceration on the left lower extremity lateral and superior to the lateral malleolus. The granulation tissue was noted and she was referred to the wound center. the ER notes that her ABIs were within normal limits and she was sent home on consultative management. a duplex scan of the lower extremities was done by the nursing home and this showed that the right ABI was 0.96 with a multiphasic waveform throughout the right leg. There was a monophasic waveform of the left leg suggesting left external iliac artery stenosis with blunting distally and the ABI could not be calculated on the left side. Moderate to severe ischemia was suspected of the left leg and a MR angiogram was recommended. 09/17/2014 -- Xray Left tibia and fibula --- unremarkable tibia and fibula Her wound culture is back and this has grown a heavy growth of  staph aureus sensitive to ciprofloxacin and clindamycin and Bactrim. [name], MONSIVAIS ([id number]) She has been on doxycycline since July 14 for 10 days and we will review her clinically before switching her antibiotics if need be. After reviewing her chart she is not going to be here till 09/17/2014 and hence I will call in Bactrim DS 1 twice a day for 14 days. She has seen Dr. Lucky Cowboy for an opinion and she is scheduled a procedure on this coming Thursday. other than that the pain and swelling continues and she has no other acute problems. 09/25/2014 -- she had a recent procedure done by Dr. Lucky Cowboy for diffuse SFA stenosis, popliteal occlusion and tibial disease. she had at angioplasty of the left peroneal artery and tibioperoneal trunk, left superficial femoral artery, left popliteal artery, distal SFA and popliteal artery. the patient is experiencing some swelling  of the left lower extremity after the procedure. 10/09/2014 -- she has an appointment to see her vascular surgeon again this weekend for possibly a vascular Doppler. No other issues. 10/23/2014 -- The patient was recently seen by Dr. Lucky Cowboy on this past Friday. We do not have his reports but we do know that he was pleased with her progress and did not plan any further procedure in the near future. 10/30/2014 - we have received Dr. Bunnie Domino evaluation and he has mentioned that the duplex done showed a stenosis versus a lesion in the distal popliteal artery and tibioperoneal trunk in an area below her previous intervention and the overall flow was significantly better and the stent itself was patent. Her perfusion has improved but it was not optimized. Since her wound was improving he did not recommend any angiogram at this point. However if her wound worsens or infection develops he would recommend an angiogram and further evaluation to improve her perfusion. He is going to see her back in 6-8 weeks. 11/20/2014 -- she was not here last week  because of her illness and is here to see as after 2 weeks. Her daughter has checked with her insurance company and she will have no out-of-pocket expense for her skin substitute. Objective Constitutional Pulse regular. Respirations normal and unlabored. Afebrile. Vitals Time Taken: 10:00 AM, Height: 67 in, Weight: 159 lbs, BMI: 24.9, Temperature: 97.6 F, Pulse: 83 bpm, Respiratory Rate: 18 breaths/min, Blood Pressure: 110/64 mmHg. Eyes Nonicteric. Reactive to light. Ears, Nose, Mouth, and Throat Lips, teeth, and gums WNL.Marland Kitchen Moist mucosa without lesions . Neck supple and nontender. No palpable supraclavicular or cervical adenopathy. Normal sized without goiter. [name], [name] ([id number]) Respiratory WNL. No retractions.. Cardiovascular Pedal Pulses WNL. No clubbing, cyanosis or edema. Lymphatic No adneopathy. No adenopathy. No adenopathy. Musculoskeletal Adexa without tenderness or enlargement.. Digits and nails w/o clubbing, cyanosis, infection, petechiae, ischemia, or inflammatory conditions.Marland Kitchen Psychiatric Judgement and insight Intact.. No evidence of depression, anxiety, or agitation.. General Notes: The wound on the left lower extremity is looking much cleaner and there is good epithelialization. The one on the dorsum of the foot has some slough and this was sharply debrided. Integumentary (Hair, Skin) No suspicious lesions. No crepitus or fluctuance. No peri-wound warmth or erythema. No masses.. Wound #1 status is Open. Original cause of wound was Gradually Appeared. The wound is located on the Left,Lateral Lower Leg. The wound measures 4cm length x 0.5cm width x 0.1cm depth; 1.571cm^2 area and 0.157cm^3 volume. Wound #3 status is Open. Original cause of wound was Pressure Injury. The wound is located on the Left,Medial,Dorsal Foot. The wound measures 0.9cm length x 1.7cm width x 0.1cm depth; 1.202cm^2 area and 0.12cm^3 volume. Wound #4 status is Open. Original cause of  wound was Shear/Friction. The wound is located on the Left,Lateral,Dorsal Foot. The wound measures 0.5cm length x 1.3cm width x 0.1cm depth; 0.511cm^2 area and 0.051cm^3 volume. Assessment Active Problems ICD-10 I70.248 - Atherosclerosis of native arteries of left leg with ulceration of other part of lower left leg L97.222 - Non-pressure chronic ulcer of left calf with fat layer exposed L03.116 - Cellulitis of left lower limb Mcfarren, Azia L. ([id number]) The wound on the lateral lower extremity which has good epithelialization will not need a skin substitute today. The one on the dorsum of her foot needs silver alginate. He was a light compression with Profore light and monitor her carefully. Procedures Wound #1 Wound #1 is an Arterial Insufficiency Ulcer located on the Left,Lateral  Lower Leg . There was a Non-Viable Tissue Open Wound/Selective [phone]) debridement with total area of 2 sq cm performed by Christin Fudge, MD. with the following instrument(s): Forceps to remove Non-Viable tissue/material including Exudate, Fibrin/Slough, Eschar, and Skin after achieving pain control using Lidocaine 4% Topical Solution. A time out was conducted prior to the start of the procedure. A Minimum amount of bleeding was controlled with Pressure. The procedure was tolerated well with a pain level of 0 throughout and a pain level of 0 following the procedure. Post Debridement Measurements: 4cm length x 0.5cm width x 0.1cm depth; 0.157cm^3 volume. Post procedure Diagnosis Wound #1: Same as Pre-Procedure Plan Wound Cleansing: Wound #1 Left,Lateral Lower Leg: May Shower, gently pat wound dry prior to applying new dressing. May shower with protection. No tub bath. Wound #3 Left,Medial,Dorsal Foot: May Shower, gently pat wound dry prior to applying new dressing. May shower with protection. No tub bath. Wound #4 Left,Lateral,Dorsal Foot: May Shower, gently pat wound dry prior to applying new  dressing. May shower with protection. No tub bath. Anesthetic: Wound #1 Left,Lateral Lower Leg: Topical Lidocaine 4% cream applied to wound bed prior to debridement Wound #3 Left,Medial,Dorsal Foot: Topical Lidocaine 4% cream applied to wound bed prior to debridement Wound #4 Left,Lateral,Dorsal Foot: SIBLE, STRALEY. ([id number]) Topical Lidocaine 4% cream applied to wound bed prior to debridement Skin Barriers/Peri-Wound Care: Wound #1 Left,Lateral Lower Leg: Barrier cream Wound #3 Left,Medial,Dorsal Foot: Barrier cream Wound #4 Left,Lateral,Dorsal Foot: Barrier cream Primary Wound Dressing: Wound #1 Left,Lateral Lower Leg: Mepitel One Wound #3 Left,Medial,Dorsal Foot: Aquacel Ag Wound #4 Left,Lateral,Dorsal Foot: Aquacel Ag Secondary Dressing: Wound #1 Left,Lateral Lower Leg: ABD pad Wound #3 Left,Medial,Dorsal Foot: ABD pad Wound #4 Left,Lateral,Dorsal Foot: ABD pad Dressing Change Frequency: Wound #1 Left,Lateral Lower Leg: Change dressing every week Follow-up Appointments: Wound #1 Left,Lateral Lower Leg: Return Appointment in 1 week. Edema Control: Wound #1 Left,Lateral Lower Leg: 3 Layer Compression System - Left Lower Extremity - Profore lite used. Elevate legs to the level of the heart and pump ankles as often as possible Other: - Monitor toes for discoloration;blue/purple The wound on the lateral lower extremity which has good epithelialization will not need a skin substitute today. The one on the dorsum of her foot needs silver alginate. He was a light compression with Profore light and monitor her carefully. Electronic Signature(s) Signed: 11/27/2014 11:03:47 AM By: Christin Fudge MD, FACS [place], [name] ([id number]) Entered By: Christin Fudge on 11/27/2014 11:03:47 [name] ([id number]) -------------------------------------------------------------------------------- SuperBill Details Patient Name: [name] Date of Service:  11/27/2014 Medical Record Patient Account Number: [id number] Number: Afful, RN, BSN, Treating RN: 1923/01/03 (81 y.o. Velva Harman Date of Birth/Sex: Female) Other Clinician: Primary Care Physician: Einar Pheasant Treating Imaya Duffy Referring Physician: Einar Pheasant Physician/Extender: Weeks in Treatment: 11 Diagnosis Coding ICD-10 Codes Code Description I70.248 Atherosclerosis of native arteries of left leg with ulceration of other part of lower left leg L97.222 Non-pressure chronic ulcer of left calf with fat layer exposed L03.116 Cellulitis of left lower limb Facility Procedures CPT4: Description Modifier Quantity Code [id number] - DEBRIDE WOUND 1ST 20 SQ CM OR < 1 ICD-10 Description Diagnosis I70.248 Atherosclerosis of native arteries of left leg with ulceration of other part of lower left leg L97.222 Non-pressure chronic  ulcer of left calf with fat layer exposed L03.116 Cellulitis of left lower limb Physician Procedures CPT4: Description Modifier Quantity Code [id number] - WC PHYS DEBR WO ANESTH 20 SQ CM 1 ICD-10 Description  Diagnosis I70.248 Atherosclerosis of native arteries of left leg with ulceration of other part of lower left leg L97.222 Non-pressure chronic  ulcer of left calf with fat layer exposed L03.116 Cellulitis of left lower limb Electronic Signature(s) Signed: 11/27/2014 11:03:56 AM By: Christin Fudge MD, FACS Entered By: Christin Fudge on 11/27/2014 11:03:56

## 2014-11-29 ENCOUNTER — Ambulatory Visit (INDEPENDENT_AMBULATORY_CARE_PROVIDER_SITE_OTHER): Payer: Medicare Other | Admitting: Family Medicine

## 2014-11-29 ENCOUNTER — Encounter: Payer: Self-pay | Admitting: Family Medicine

## 2014-11-29 VITALS — BP 108/66 | HR 86 | Temp 98.1°F | Ht 64.5 in | Wt 154.0 lb

## 2014-11-29 DIAGNOSIS — I10 Essential (primary) hypertension: Secondary | ICD-10-CM | POA: Diagnosis not present

## 2014-11-29 DIAGNOSIS — I482 Chronic atrial fibrillation, unspecified: Secondary | ICD-10-CM

## 2014-11-29 DIAGNOSIS — R7989 Other specified abnormal findings of blood chemistry: Secondary | ICD-10-CM

## 2014-11-29 DIAGNOSIS — D649 Anemia, unspecified: Secondary | ICD-10-CM

## 2014-11-29 DIAGNOSIS — I639 Cerebral infarction, unspecified: Secondary | ICD-10-CM

## 2014-11-29 DIAGNOSIS — R946 Abnormal results of thyroid function studies: Secondary | ICD-10-CM

## 2014-11-29 DIAGNOSIS — E871 Hypo-osmolality and hyponatremia: Secondary | ICD-10-CM | POA: Diagnosis not present

## 2014-11-29 DIAGNOSIS — R609 Edema, unspecified: Secondary | ICD-10-CM

## 2014-11-29 DIAGNOSIS — E7800 Pure hypercholesterolemia, unspecified: Secondary | ICD-10-CM | POA: Diagnosis not present

## 2014-11-29 DIAGNOSIS — I2510 Atherosclerotic heart disease of native coronary artery without angina pectoris: Secondary | ICD-10-CM

## 2014-11-29 DIAGNOSIS — I739 Peripheral vascular disease, unspecified: Secondary | ICD-10-CM | POA: Diagnosis not present

## 2014-11-29 DIAGNOSIS — E1159 Type 2 diabetes mellitus with other circulatory complications: Secondary | ICD-10-CM

## 2014-11-29 LAB — BAYER DCA HB A1C WAIVED: HB A1C (BAYER DCA - WAIVED): 6.7 % (ref <7.0)

## 2014-11-29 MED ORDER — LISINOPRIL 40 MG PO TABS: 40.0000 mg | ORAL_TABLET | ORAL | Status: DC

## 2014-11-29 MED ORDER — FUROSEMIDE 40 MG PO TABS: 40.0000 mg | ORAL_TABLET | Freq: Every day | ORAL | Status: AC

## 2014-11-29 MED ORDER — POTASSIUM CHLORIDE ER 10 MEQ PO CPCR: 10.0000 meq | ORAL_CAPSULE | Freq: Every day | ORAL | Status: AC

## 2014-11-29 MED ORDER — LIDOCAINE 5 % EX PTCH: 1.0000 | MEDICATED_PATCH | CUTANEOUS | Status: AC

## 2014-11-29 NOTE — Assessment & Plan Note (Signed)
On elaquis at this time. Rate controlled. Continue current regimen. Continue to monitor.

## 2014-11-29 NOTE — Assessment & Plan Note (Signed)
Rechecking labs today. Await results.  

## 2014-11-29 NOTE — Assessment & Plan Note (Signed)
Continue to follow with Dr. Lucky Cowboy. Stable. Continue current regimen.

## 2014-11-29 NOTE — Assessment & Plan Note (Signed)
Checking labs today. Await results.  

## 2014-11-29 NOTE — Progress Notes (Signed)
BP 108/66 mmHg  Pulse 86  Temp(Src) 98.1 F (36.7 C)  Ht 5' 4.5" (1.638 m)  Wt 154 lb (69.854 kg)  BMI 26.04 kg/m2  SpO2 99%   Subjective:    Patient ID: [name], female    DOB: 07-19-1922, 81 y.o.   MRN: [id number]  HPI: [name] is a 81 y.o. female who presents today to establish care. Her daughter states that they were looking for another physician closer to home. She has just gotten out of SNF after a 2 month long hospitalization for cellulitis and PAD with ulceration of LLE, calf ulceration. Has been out of the SNF almost a month now.    Chief Complaint  Patient presents with  . Hypertension   Working with [employer]- PT, home health, and nursing coming out to check on her. Her daughter comes daily. Son lives 5 hours away, but calls daily. Very happy to be home. Very upset when she has to leave. Doing well, but getting used to being home. Very unhappy in the SNF, felt like she couldn't move around and was in bed all day. Not a big Educational psychologist. Started on oxycodone and xanax at the SNF- feels like they make her feel funny and not sleep well. Did fine with tylenol before.   Her son is concern about a bit of dementia/depression. He notes that she has been confused. Not acting like herself. Daughter has noticed this as well, but thinks it due to meds and depression. [name] notes that she has been feeling more down since she went to the hospital the first time, but she is not sure if she is feeling better since coming home or not yet.    Pain- has a lot of pain in her hips from sitting a lot, especially since going into the wheel chair. Takes the oxycodone, which helps, but seems to keep her awake at night and make her sleep during the day. Did well with tylenol in the past, doesn't really want to take that medicine because of how it makes her feel.   Seeing the wound care doctor- has been helping to heal the wounds on her legs. Seeing Dr. Lucky Cowboy for vascular. Does not have a  cardiologist.   Notes that she has diabetes, but has never needed any medicine for her and has been controlled with diet. No problems with that. No visual issues. No chest pains. No SOB. Not checking her sugars. Not really watching her diet.   HYPERTENSION Hypertension status: controlled  Satisfied with current treatment? yes Duration of hypertension: chronic BP monitoring frequency:  rarely BP medication side effects:  no Medication compliance: excellent compliance Aspirin: yes Recurrent headaches: no Visual changes: no Palpitations: no Dyspnea: no Chest pain: no Lower extremity edema: yes Dizzy/lightheaded: no  Relevant past medical, surgical, family and social history reviewed and updated as indicated. Interim medical history since our last visit reviewed. Allergies and medications reviewed and updated.  Review of Systems  Constitutional: Negative.   Respiratory: Negative.   Cardiovascular: Negative.   Gastrointestinal: Negative.   Musculoskeletal: Positive for myalgias, back pain, arthralgias and gait problem. Negative for joint swelling, neck pain and neck stiffness.  Neurological: Negative.   Psychiatric/Behavioral: Positive for sleep disturbance. Negative for suicidal ideas, hallucinations, behavioral problems, confusion, self-injury, dysphoric mood and decreased concentration. The patient is nervous/anxious. The patient is not hyperactive.    Per HPI unless specifically indicated above     Objective:    BP 108/66 mmHg  Pulse 86  Temp(Src) 98.1 F (36.7 C)  Ht 5' 4.5" (1.638 m)  Wt 154 lb (69.854 kg)  BMI 26.04 kg/m2  SpO2 99%  Wt Readings from Last 3 Encounters:  11/29/14 154 lb (69.854 kg)  09/20/14 147 lb (66.679 kg)  09/04/14 145 lb (65.772 kg)    Physical Exam  Constitutional: She is oriented to person, place, and time. She appears well-developed and well-nourished. No distress.  HENT:  Head: Normocephalic and atraumatic.  Right Ear: Hearing and  external ear normal.  Left Ear: Hearing and external ear normal.  Nose: Nose normal.  Mouth/Throat: Oropharynx is clear and moist. No oropharyngeal exudate.  Eyes: Conjunctivae and lids are normal. Pupils are equal, round, and reactive to light. Right eye exhibits no discharge. Left eye exhibits no discharge. No scleral icterus.  Cardiovascular: Normal rate and regular rhythm.  Exam reveals no gallop and no friction rub.   Murmur heard. Pulmonary/Chest: Effort normal. No respiratory distress. She has no wheezes. She has no rales. She exhibits no tenderness.  Musculoskeletal: Normal range of motion.  Neurological: She is alert and oriented to person, place, and time.  Skin: Skin is warm, dry and intact. No rash noted. No erythema. No pallor.  Psychiatric: She has a normal mood and affect. Her speech is normal and behavior is normal. Judgment and thought content normal. Cognition and memory are normal.  Nursing note and vitals reviewed.   Results for orders placed or performed in visit on 11/29/14  Bayer DCA Hb A1c Waived  Result Value Ref Range   Bayer DCA Hb A1c Waived 6.7 <7.0 %      Assessment & Plan:   Problem List Items Addressed This Visit      Cardiovascular and Mediastinum   Atrial fibrillation ([place])    On elaquis at this time. Rate controlled. Continue current regimen. Continue to monitor.       Relevant Medications   furosemide (LASIX) 40 MG tablet   lisinopril (PRINIVIL,ZESTRIL) 40 MG tablet   Other Relevant Orders   Comprehensive metabolic panel   UA/M w/rflx Culture, Routine   Essential hypertension, benign    Under good control at this time. Will continue current regimen. Checking labs today, await results.       Relevant Medications   furosemide (LASIX) 40 MG tablet   lisinopril (PRINIVIL,ZESTRIL) 40 MG tablet   Other Relevant Orders   Comprehensive metabolic panel   Microalbumin, Urine Waived   UA/M w/rflx Culture, Routine   CAD (coronary artery disease)  - Primary    No cardiologist at this time. Checking labs and lipids. Continue to monitor.       Relevant Medications   furosemide (LASIX) 40 MG tablet   lisinopril (PRINIVIL,ZESTRIL) 40 MG tablet   Other Relevant Orders   Comprehensive metabolic panel   UA/M w/rflx Culture, Routine   Peripheral vascular disease ([facility])    Continue to follow with Dr. Lucky Cowboy. Stable. Continue current regimen.       Relevant Medications   furosemide (LASIX) 40 MG tablet   lisinopril (PRINIVIL,ZESTRIL) 40 MG tablet     Endocrine   Diabetes ([place])    Diet controlled with A1c today of 6.7. Check again in 3 months. No need for medication at this time.       Relevant Medications   lisinopril (PRINIVIL,ZESTRIL) 40 MG tablet     Other   Hypercholesterolemia    Checking labs today. Await results.       Relevant Medications  furosemide (LASIX) 40 MG tablet   lisinopril (PRINIVIL,ZESTRIL) 40 MG tablet   Other Relevant Orders   Comprehensive metabolic panel   Lipid Panel w/o Chol/HDL Ratio   UA/M w/rflx Culture, Routine   Hyponatremia    Rechecking labs today. Await results.       Relevant Orders   Comprehensive metabolic panel   UA/M w/rflx Culture, Routine   Edema   Relevant Orders   Comprehensive metabolic panel   UA/M w/rflx Culture, Routine    Other Visit Diagnoses    Type 2 diabetes mellitus with other circulatory complications ([place])        Relevant Medications    lisinopril (PRINIVIL,ZESTRIL) 40 MG tablet    Other Relevant Orders    Bayer DCA Hb A1c Waived (Completed)    Comprehensive metabolic panel    Microalbumin, Urine Waived    UA/M w/rflx Culture, Routine    Elevated TSH        Rechecking TSH today. Await results.     Relevant Orders    Comprehensive metabolic panel    TSH    UA/M w/rflx Culture, Routine    Anemia, unspecified anemia type        Rechecking CBC today. Await results.     Relevant Orders    CBC with Differential/Platelet    Comprehensive metabolic panel     UA/M w/rflx Culture, Routine        Follow up plan: Return in about 4 weeks (around 12/27/2014).

## 2014-11-29 NOTE — Assessment & Plan Note (Signed)
Diet controlled with A1c today of 6.7. Check again in 3 months. No need for medication at this time.

## 2014-11-29 NOTE — Assessment & Plan Note (Signed)
Under good control at this time. Will continue current regimen. Checking labs today, await results.

## 2014-11-29 NOTE — Assessment & Plan Note (Signed)
No cardiologist at this time. Checking labs and lipids. Continue to monitor.

## 2014-11-30 ENCOUNTER — Encounter: Payer: Self-pay | Admitting: Family Medicine

## 2014-11-30 DIAGNOSIS — D696 Thrombocytopenia, unspecified: Secondary | ICD-10-CM | POA: Insufficient documentation

## 2014-11-30 LAB — CBC WITH DIFFERENTIAL/PLATELET
Basophils Absolute: 0 10*3/uL (ref 0.0–0.2)
Basos: 0 %
EOS (ABSOLUTE): 0.1 10*3/uL (ref 0.0–0.4)
Eos: 2 %
HEMOGLOBIN: 11.9 g/dL (ref 11.1–15.9)
Hematocrit: 34.9 % (ref 34.0–46.6)
IMMATURE GRANULOCYTES: 1 %
Immature Grans (Abs): 0 10*3/uL (ref 0.0–0.1)
LYMPHS ABS: 1.4 10*3/uL (ref 0.7–3.1)
Lymphs: 23 %
MCH: 29.1 pg (ref 26.6–33.0)
MCHC: 34.1 g/dL (ref 31.5–35.7)
MCV: 85 fL (ref 79–97)
MONOCYTES: 8 %
Monocytes Absolute: 0.7 10*3/uL (ref 0.1–0.9)
NEUTROS PCT: 66 %
Neutrophils Absolute: 4.6 10*3/uL (ref 1.4–7.0)
Platelets: 76 10*3/uL — CL (ref 150–379)
RBC: 4.09 x10E6/uL (ref 3.77–5.28)
RDW: 15.4 % (ref 12.3–15.4)
WBC: 6.9 10*3/uL (ref 3.4–10.8)

## 2014-11-30 LAB — COMPREHENSIVE METABOLIC PANEL
ALBUMIN: 4.1 g/dL (ref 3.2–4.6)
ALK PHOS: 92 IU/L (ref 39–117)
ALT: 18 IU/L (ref 0–32)
AST: 25 IU/L (ref 0–40)
Albumin/Globulin Ratio: 1.7 (ref 1.1–2.5)
BUN/Creatinine Ratio: 24 (ref 11–26)
BUN: 24 mg/dL (ref 10–36)
Bilirubin Total: 1 mg/dL (ref 0.0–1.2)
CO2: 24 mmol/L (ref 18–29)
CREATININE: 1 mg/dL (ref 0.57–1.00)
Calcium: 9.7 mg/dL (ref 8.7–10.3)
Chloride: 95 mmol/L — ABNORMAL LOW (ref 97–108)
GFR calc Af Amer: 57 mL/min/{1.73_m2} — ABNORMAL LOW (ref >59)
GFR calc non Af Amer: 50 mL/min/{1.73_m2} — ABNORMAL LOW (ref >59)
GLUCOSE: 151 mg/dL — AB (ref 65–99)
Globulin, Total: 2.4 g/dL (ref 1.5–4.5)
Potassium: 4.1 mmol/L (ref 3.5–5.2)
Sodium: 136 mmol/L (ref 134–144)
Total Protein: 6.5 g/dL (ref 6.0–8.5)

## 2014-11-30 LAB — LIPID PANEL W/O CHOL/HDL RATIO
CHOLESTEROL TOTAL: 150 mg/dL (ref 100–199)
HDL: 57 mg/dL (ref >39)
LDL CALC: 75 mg/dL (ref 0–99)
TRIGLYCERIDES: 90 mg/dL (ref 0–149)
VLDL CHOLESTEROL CAL: 18 mg/dL (ref 5–40)

## 2014-11-30 LAB — TSH: TSH: 8.04 u[IU]/mL — ABNORMAL HIGH (ref 0.450–4.500)

## 2014-12-03 ENCOUNTER — Telehealth: Payer: Self-pay | Admitting: Family Medicine

## 2014-12-03 DIAGNOSIS — R7989 Other specified abnormal findings of blood chemistry: Secondary | ICD-10-CM | POA: Insufficient documentation

## 2014-12-03 DIAGNOSIS — E039 Hypothyroidism, unspecified: Secondary | ICD-10-CM

## 2014-12-03 DIAGNOSIS — D696 Thrombocytopenia, unspecified: Secondary | ICD-10-CM

## 2014-12-03 NOTE — Telephone Encounter (Signed)
Dr.Johnson spoke with patients daughter about this while giving her the lab results.

## 2014-12-03 NOTE — Telephone Encounter (Signed)
Kara BradfordPatient daughter called stating that her potassium chloride pills are too big for her to swallo and if Dr. Wynetta Emery can prescribe her something smaller. Her patches if she can not get them if she can give her something else that insurance will cover. Kara Foxx- Patient needs prior authorization for Lidocaine.

## 2014-12-03 NOTE — Telephone Encounter (Signed)
Called to let patient and her daughter know that her thyroid was up and her platelets were down. This may just be because of recent hospitalization and the stress, I'd like her to come back in in 2 weeks, if still low platelets, then we'll send her to talk to the blood doctor. Will recheck thyroid in 1 month. Advised patient's daughter to talk to the pharmacist about smaller potassium pills.

## 2014-12-04 ENCOUNTER — Encounter: Payer: Medicare Other | Admitting: Surgery

## 2014-12-04 DIAGNOSIS — L97222 Non-pressure chronic ulcer of left calf with fat layer exposed: Secondary | ICD-10-CM | POA: Diagnosis not present

## 2014-12-05 ENCOUNTER — Ambulatory Visit: Payer: Self-pay | Admitting: Unknown Physician Specialty

## 2014-12-05 NOTE — Progress Notes (Signed)
[name], [name] ([id number]) Visit Report for 12/04/2014 Chief Complaint Document Details Patient Name: [name] 12/04/2014 10:15 Date of Service: AM Medical Record [id number] Number: Patient Account Number: [id number] [birth year] (81 y.o. Treating RN: Montey Hora Date of Birth/Sex: Female) Other Clinician: Primary Care Physician: Wynetta Emery, MEGAN Treating Aidah Forquer Referring Physician: Einar Pheasant Physician/Extender: Suella Grove in Treatment: 12 Information Obtained from: Patient Chief Complaint Patient presents to the wound care center today with an open arterial ulcer. The patient has had left lower extremity ulceration for about 1 month. Electronic Signature(s) Signed: 12/04/2014 11:00:37 AM By: Christin Fudge MD, FACS Entered By: Christin Fudge on 12/04/2014 11:00:37 [name] ([id number]) -------------------------------------------------------------------------------- Debridement Details Patient Name: [name], [name] 12/04/2014 10:15 Date of Service: AM Medical Record [id number] Number: Patient Account Number: [id number] 23-Nov-1922 (80 y.o. Treating RN: Montey Hora Date of Birth/Sex: Female) Other Clinician: Primary Care Physician: Wynetta Emery, MEGAN Treating Prentis Langdon Referring Physician: Einar Pheasant Physician/Extender: Suella Grove in Treatment: 12 Debridement Performed for Wound #1 Left,Lateral Lower Leg Assessment: Performed By: Physician Christin Fudge, MD Debridement: Debridement Pre-procedure Yes Verification/Time Out Taken: Start Time: 10:56 Pain Control: Lidocaine 4% Topical Solution Level: Skin/Subcutaneous Tissue Total Area Debrided (L x 3.9 (cm) x 1.3 (cm) = 5.07 (cm) W): Tissue and other Viable, Non-Viable, Eschar, Fibrin/Slough, Subcutaneous material debrided: Instrument: Forceps Bleeding: Minimum Hemostasis Achieved: Pressure End Time: 10:57 Procedural Pain: 0 Post Procedural Pain: 0 Response to Treatment: Procedure was  tolerated well Post Debridement Measurements of Total Wound Length: (cm) 3.9 Width: (cm) 1.3 Depth: (cm) 0.1 Volume: (cm) 0.398 Post Procedure Diagnosis Same as Pre-procedure Electronic Signature(s) Signed: 12/04/2014 11:00:23 AM By: Christin Fudge MD, FACS Signed: 12/04/2014 5:20:05 PM By: Montey Hora Entered By: Christin Fudge on 12/04/2014 11:00:23 [name] ([id number]) -------------------------------------------------------------------------------- Debridement Details Patient Name: [name], [name] 12/04/2014 10:15 Date of Service: AM Medical Record [id number] Number: Patient Account Number: [id number] Mar 21, 1922 (81 y.o. Treating RN: Montey Hora Date of Birth/Sex: Female) Other Clinician: Primary Care Physician: Wynetta Emery, MEGAN Treating Adien Kimmel Referring Physician: Einar Pheasant Physician/Extender: Suella Grove in Treatment: 12 Debridement Performed for Wound #3 Left,Medial,Dorsal Foot Assessment: Performed By: Physician Christin Fudge, MD Debridement: Debridement Pre-procedure Yes Verification/Time Out Taken: Start Time: 10:57 Pain Control: Lidocaine 4% Topical Solution Level: Skin/Subcutaneous Tissue Total Area Debrided (L x 0.7 (cm) x 1.7 (cm) = 1.19 (cm) W): Tissue and other Viable, Non-Viable, Eschar, Fibrin/Slough, Subcutaneous material debrided: Instrument: Forceps Bleeding: Minimum Hemostasis Achieved: Pressure End Time: 10:57 Procedural Pain: 0 Post Procedural Pain: 0 Response to Treatment: Procedure was tolerated well Post Debridement Measurements of Total Wound Length: (cm) 0.7 Stage: Category/Stage III Width: (cm) 1.7 Depth: (cm) 0.2 Volume: (cm) 0.187 Post Procedure Diagnosis Same as Pre-procedure Electronic Signature(s) Signed: 12/04/2014 11:00:31 AM By: Christin Fudge MD, FACS Signed: 12/04/2014 5:20:05 PM By: Montey Hora Entered By: Christin Fudge on 12/04/2014 11:00:30 [name]  ([id number]) -------------------------------------------------------------------------------- HPI Details Patient Name: [name], [name] 12/04/2014 10:15 Date of Service: AM Medical Record [id number] Number: Patient Account Number: [id number] [birth year] (81 y.o. Treating RN: Montey Hora Date of Birth/Sex: Female) Other Clinician: Primary Care Physician: Wynetta Emery, MEGAN Treating Merle Cirelli Referring Physician: Einar Pheasant Physician/Extender: Weeks in Treatment: 12 History of Present Illness Location: left lower extremity ulceration Quality: Patient reports experiencing burning to affected area(s). Severity: Patient states wound are getting worse. Duration: Patient has had the wound for < 4 weeks prior to presenting for treatment Timing: Pain in wound is constant (hurts all the time) Context: The wound appeared gradually over time  Modifying Factors: Other treatment(s) tried include:has been on oral anti-buttocks since the beginning of the month and had 10 days of Augmentin and now is on doxycycline. Associated Signs and Symptoms: Patient reports having difficulty standing for long periods. HPI Description: 81 year old patient was admitted to [facility] between 08/13/2014 and 08/17/2014 with cellulitis of the left lower extremity.duplex study done then showed no DVT and the patient was on IV vancomycin and Rocephin and also received Unasyn later. The patient was sent home on 10 days of Augmentin. Her other comorbidities of atrial fibrillation, essential hypertension, diabetes mellitus type 2, coronary artery disease were managed with appropriate medications. on 09/04/2014 she was seen in the ER and was found to have a 10 cm ulceration on the left lower extremity lateral and superior to the lateral malleolus. The granulation tissue was noted and she was referred to the wound center. the ER notes that her ABIs were within normal limits and she was sent home  on consultative management. a duplex scan of the lower extremities was done by the nursing home and this showed that the right ABI was 0.96 with a multiphasic waveform throughout the right leg. There was a monophasic waveform of the left leg suggesting left external iliac artery stenosis with blunting distally and the ABI could not be calculated on the left side. Moderate to severe ischemia was suspected of the left leg and a MR angiogram was recommended. 09/17/2014 -- Xray Left tibia and fibula --- unremarkable tibia and fibula Her wound culture is back and this has grown a heavy growth of staph aureus sensitive to ciprofloxacin and clindamycin and Bactrim. She has been on doxycycline since July 14 for 10 days and we will review her clinically before switching her antibiotics if need be. After reviewing her chart she is not going to be here till 09/17/2014 and hence I will call in Bactrim DS 1 twice a day for 14 days. She has seen Dr. Lucky Cowboy for an opinion and she is scheduled a procedure on this coming Thursday. other than that the pain and swelling continues and she has no other acute problems. 09/25/2014 -- she had a recent procedure done by Dr. Lucky Cowboy for diffuse SFA stenosis, popliteal occlusion and tibial disease. she had at angioplasty of the left peroneal artery and tibioperoneal trunk, left superficial Mattos, Kelilah L. ([id number]) femoral artery, left popliteal artery, distal SFA and popliteal artery. the patient is experiencing some swelling of the left lower extremity after the procedure. 10/09/2014 -- she has an appointment to see her vascular surgeon again this weekend for possibly a vascular Doppler. No other issues. 10/23/2014 -- The patient was recently seen by Dr. Lucky Cowboy on this past Friday. We do not have his reports but we do know that he was pleased with her progress and did not plan any further procedure in the near future. 10/30/2014 - we have received Dr. Bunnie Domino evaluation and  he has mentioned that the duplex done showed a stenosis versus a lesion in the distal popliteal artery and tibioperoneal trunk in an area below her previous intervention and the overall flow was significantly better and the stent itself was patent. Her perfusion has improved but it was not optimized. Since her wound was improving he did not recommend any angiogram at this point. However if her wound worsens or infection develops he would recommend an angiogram and further evaluation to improve her perfusion. He is going to see her back in 6-8 weeks. 11/20/2014 -- she was  not here last week because of her illness and is here to see as after 2 weeks. Her daughter has checked with her insurance company and she will have no out-of-pocket expense for her skin substitute. 12/04/2014 -- she has her skin substitute available but due to the nature of her wounds she will not use it today. Electronic Signature(s) Signed: 12/04/2014 11:01:24 AM By: Christin Fudge MD, FACS Entered By: Christin Fudge on 12/04/2014 11:01:24 [name] ([id number]) -------------------------------------------------------------------------------- Physical Exam Details Patient Name: [name], [name] 12/04/2014 10:15 Date of Service: AM Medical Record [id number] Number: Patient Account Number: [id number] [birth year] (81 y.o. Treating RN: Montey Hora Date of Birth/Sex: Female) Other Clinician: Primary Care Physician: Wynetta Emery, MEGAN Treating Derron Pipkins Referring Physician: Einar Pheasant Physician/Extender: Weeks in Treatment: 12 Notes The superior wound is looking very good and will not need a skin substitute today. The inferior wound on the dorsum of the foot still has slough and would need sharp debridement. Electronic Signature(s) Signed: 12/04/2014 11:01:44 AM By: Christin Fudge MD, FACS Entered By: Christin Fudge on 12/04/2014 11:01:44 [name]  ([id number]) -------------------------------------------------------------------------------- Physician Orders Details Patient Name: [name], [name] 12/04/2014 10:15 Date of Service: AM Medical Record [id number] Number: Patient Account Number: [id number] 12/30/1922 (81 y.o. Treating RN: Montey Hora Date of Birth/Sex: Female) Other Clinician: Primary Care Physician: Wynetta Emery, MEGAN Treating Deena Shaub Referring Physician: Einar Pheasant Physician/Extender: Suella Grove in Treatment: 12 Verbal / Phone Orders: Yes Clinician: Montey Hora Read Back and Verified: Yes Diagnosis Coding Wound Cleansing Wound #1 Left,Lateral Lower Leg o May Shower, gently pat wound dry prior to applying new dressing. o May shower with protection. o No tub bath. Wound #3 Left,Medial,Dorsal Foot o May Shower, gently pat wound dry prior to applying new dressing. o May shower with protection. o No tub bath. Wound #4 Left,Lateral,Dorsal Foot o May Shower, gently pat wound dry prior to applying new dressing. o May shower with protection. o No tub bath. Anesthetic Wound #1 Left,Lateral Lower Leg o Topical Lidocaine 4% cream applied to wound bed prior to debridement Wound #3 Left,Medial,Dorsal Foot o Topical Lidocaine 4% cream applied to wound bed prior to debridement Wound #4 Left,Lateral,Dorsal Foot o Topical Lidocaine 4% cream applied to wound bed prior to debridement Skin Barriers/Peri-Wound Care Wound #1 Left,Lateral Lower Leg o Barrier cream Wound #3 Left,Medial,Dorsal Foot o Barrier cream Wound #4 Left,Lateral,Dorsal Foot o Barrier cream [name], Kara L. ([id number]) Primary Wound Dressing Wound #1 Left,Lateral Lower Leg o Promogran o Mepitel One Wound #3 Left,Medial,Dorsal Foot o Aquacel Ag Wound #4 Left,Lateral,Dorsal Foot o Aquacel Ag Secondary Dressing Wound #1 Left,Lateral Lower Leg o ABD pad Wound #3 Left,Medial,Dorsal Foot o ABD  pad Wound #4 Left,Lateral,Dorsal Foot o ABD pad Dressing Change Frequency Wound #1 Left,Lateral Lower Leg o Change dressing every week Wound #3 Left,Medial,Dorsal Foot o Change dressing every week Wound #4 Left,Lateral,Dorsal Foot o Change dressing every week Edema Control Wound #1 Left,Lateral Lower Leg o 3 Layer Compression System - Left Lower Extremity - Profore lite used. o Elevate legs to the level of the heart and pump ankles as often as possible o Other: - Monitor toes for discoloration;blue/purple Wound #3 Left,Medial,Dorsal Foot o 3 Layer Compression System - Left Lower Extremity - Profore lite used. o Elevate legs to the level of the heart and pump ankles as often as possible o Other: - Monitor toes for discoloration;blue/purple Wound #4 Left,Lateral,Dorsal Foot o 3 Layer Compression System - Left Lower Extremity - Profore lite used. o Elevate legs  to the level of the heart and pump ankles as often as possible o Other: - Monitor toes for discoloration;blue/purple [name], [name] ([id number]) Electronic Signature(s) Signed: 12/04/2014 4:16:00 PM By: Christin Fudge MD, FACS Signed: 12/04/2014 5:20:05 PM By: Montey Hora Entered By: Montey Hora on 12/04/2014 10:59:31 [name] ([id number]) -------------------------------------------------------------------------------- Problem List Details Patient Name: [name], [name] 12/04/2014 10:15 Date of Service: AM Medical Record [id number] Number: Patient Account Number: [id number] Nov 22, 1922 (81 y.o. Treating RN: Montey Hora Date of Birth/Sex: Female) Other Clinician: Primary Care Physician: Park Liter Treating Jynesis Nakamura Referring Physician: Einar Pheasant Physician/Extender: Suella Grove in Treatment: 12 Active Problems ICD-10 Encounter Code Description Active Date Diagnosis I70.248 Atherosclerosis of native arteries of left leg with ulceration 09/07/2014 Yes of other part  of lower left leg L97.222 Non-pressure chronic ulcer of left calf with fat layer 09/07/2014 Yes exposed L03.116 Cellulitis of left lower limb 09/07/2014 Yes Inactive Problems Resolved Problems Electronic Signature(s) Signed: 12/04/2014 11:00:13 AM By: Christin Fudge MD, FACS Entered By: Christin Fudge on 12/04/2014 11:00:12 [name] ([id number]) -------------------------------------------------------------------------------- Progress Note Details Patient Name: [name] 12/04/2014 10:15 Date of Service: AM Medical Record [id number] Number: Patient Account Number: [id number] 1922/03/31 (81 y.o. Treating RN: Montey Hora Date of Birth/Sex: Female) Other Clinician: Primary Care Physician: Wynetta Emery, MEGAN Treating Brandice Busser Referring Physician: Einar Pheasant Physician/Extender: Suella Grove in Treatment: 12 Subjective Chief Complaint Information obtained from Patient Patient presents to the wound care center today with an open arterial ulcer. The patient has had left lower extremity ulceration for about 1 month. History of Present Illness (HPI) The following HPI elements were documented for the patient's wound: Location: left lower extremity ulceration Quality: Patient reports experiencing burning to affected area(s). Severity: Patient states wound are getting worse. Duration: Patient has had the wound for < 4 weeks prior to presenting for treatment Timing: Pain in wound is constant (hurts all the time) Context: The wound appeared gradually over time Modifying Factors: Other treatment(s) tried include:has been on oral anti-buttocks since the beginning of the month and had 10 days of Augmentin and now is on doxycycline. Associated Signs and Symptoms: Patient reports having difficulty standing for long periods. 81 year old patient was admitted to [facility] between 08/13/2014 and 08/17/2014 with cellulitis of the left lower extremity.duplex study  done then showed no DVT and the patient was on IV vancomycin and Rocephin and also received Unasyn later. The patient was sent home on 10 days of Augmentin. Her other comorbidities of atrial fibrillation, essential hypertension, diabetes mellitus type 2, coronary artery disease were managed with appropriate medications. on 09/04/2014 she was seen in the ER and was found to have a 10 cm ulceration on the left lower extremity lateral and superior to the lateral malleolus. The granulation tissue was noted and she was referred to the wound center. the ER notes that her ABIs were within normal limits and she was sent home on consultative management. a duplex scan of the lower extremities was done by the nursing home and this showed that the right ABI was 0.96 with a multiphasic waveform throughout the right leg. There was a monophasic waveform of the left leg suggesting left external iliac artery stenosis with blunting distally and the ABI could not be calculated on the left side. Moderate to severe ischemia was suspected of the left leg and a MR angiogram was recommended. 09/17/2014 -- Xray Left tibia and fibula --- unremarkable tibia and fibula Her wound culture is back and this has grown  a heavy growth of staph aureus sensitive to ciprofloxacin and clindamycin and Bactrim. [name], [name] ([id number]) She has been on doxycycline since July 14 for 10 days and we will review her clinically before switching her antibiotics if need be. After reviewing her chart she is not going to be here till 09/17/2014 and hence I will call in Bactrim DS 1 twice a day for 14 days. She has seen Dr. Lucky Cowboy for an opinion and she is scheduled a procedure on this coming Thursday. other than that the pain and swelling continues and she has no other acute problems. 09/25/2014 -- she had a recent procedure done by Dr. Lucky Cowboy for diffuse SFA stenosis, popliteal occlusion and tibial disease. she had at angioplasty of the left  peroneal artery and tibioperoneal trunk, left superficial femoral artery, left popliteal artery, distal SFA and popliteal artery. the patient is experiencing some swelling of the left lower extremity after the procedure. 10/09/2014 -- she has an appointment to see her vascular surgeon again this weekend for possibly a vascular Doppler. No other issues. 10/23/2014 -- The patient was recently seen by Dr. Lucky Cowboy on this past Friday. We do not have his reports but we do know that he was pleased with her progress and did not plan any further procedure in the near future. 10/30/2014 - we have received Dr. Bunnie Domino evaluation and he has mentioned that the duplex done showed a stenosis versus a lesion in the distal popliteal artery and tibioperoneal trunk in an area below her previous intervention and the overall flow was significantly better and the stent itself was patent. Her perfusion has improved but it was not optimized. Since her wound was improving he did not recommend any angiogram at this point. However if her wound worsens or infection develops he would recommend an angiogram and further evaluation to improve her perfusion. He is going to see her back in 6-8 weeks. 11/20/2014 -- she was not here last week because of her illness and is here to see as after 2 weeks. Her daughter has checked with her insurance company and she will have no out-of-pocket expense for her skin substitute. 12/04/2014 -- she has her skin substitute available but due to the nature of her wounds she will not use it today. Objective Constitutional Vitals Time Taken: 10:30 AM, Height: 67 in, Weight: 159 lbs, BMI: 24.9, Temperature: 97.4 F, Pulse: 76 bpm, Respiratory Rate: 20 breaths/min, Blood Pressure: 130/70 mmHg. Integumentary (Hair, Skin) Wound #1 status is Open. Original cause of wound was Gradually Appeared. The wound is located on the Left,Lateral Lower Leg. The wound measures 3.9cm length x 1.3cm width x 0.1cm  depth; 3.982cm^2 area and 0.398cm^3 volume. The wound is limited to skin breakdown. There is no tunneling or undermining noted. There is a medium amount of serous drainage noted. The wound margin is flat and intact. There is large (67-100%) red granulation within the wound bed. There is a small (1-33%) amount of necrotic tissue within the wound bed including Eschar and Adherent Slough. The periwound skin appearance did not exhibit: Callus, Crepitus, Excoriation, Fluctuance, Friable, Induration, Localized Edema, Rash, Scarring, Dry/Scaly, Maceration, Moist, Atrophie Blanche, Cyanosis, Ecchymosis, Hemosiderin Staining, Mottled, Panzer, Nevin L. ([id number]) Pallor, Rubor, Erythema. Wound #3 status is Open. Original cause of wound was Pressure Injury. The wound is located on the Left,Medial,Dorsal Foot. The wound measures 0.7cm length x 1.7cm width x 0.2cm depth; 0.935cm^2 area and 0.187cm^3 volume. The wound is limited to skin breakdown. There is no tunneling or  undermining noted. There is a large amount of serous drainage noted. The wound margin is flat and intact. There is medium (34-66%) pink granulation within the wound bed. There is a medium (34-66%) amount of necrotic tissue within the wound bed including Eschar and Adherent Slough. The periwound skin appearance did not exhibit: Callus, Crepitus, Excoriation, Fluctuance, Friable, Induration, Localized Edema, Rash, Scarring, Dry/Scaly, Maceration, Moist, Atrophie Blanche, Cyanosis, Ecchymosis, Hemosiderin Staining, Mottled, Pallor, Rubor, Erythema. Wound #4 status is Open. Original cause of wound was Shear/Friction. The wound is located on the Left,Lateral,Dorsal Foot. The wound measures 0.3cm length x 0.7cm width x 0.2cm depth; 0.165cm^2 area and 0.033cm^3 volume. The wound is limited to skin breakdown. There is no tunneling or undermining noted. There is a medium amount of serous drainage noted. The wound margin is flat and intact. There  is small (1-33%) pink granulation within the wound bed. There is a large (67-100%) amount of necrotic tissue within the wound bed including Eschar and Adherent Slough. The periwound skin appearance did not exhibit: Callus, Crepitus, Excoriation, Fluctuance, Friable, Induration, Localized Edema, Rash, Scarring, Dry/Scaly, Maceration, Moist, Atrophie Blanche, Cyanosis, Ecchymosis, Hemosiderin Staining, Mottled, Pallor, Rubor, Erythema. Periwound temperature was noted as No Abnormality. Assessment Active Problems ICD-10 I70.248 - Atherosclerosis of native arteries of left leg with ulceration of other part of lower left leg L97.222 - Non-pressure chronic ulcer of left calf with fat layer exposed L03.116 - Cellulitis of left lower limb We will not use the skin substitute and we will use collagen on the superior wound and silver alginate on the wound on the dorsum of the foot. She will use a Profore light compression. Procedures Wound #1 Wound #1 is an Arterial Insufficiency Ulcer located on the Left,Lateral Lower Leg . There was a Skin/Subcutaneous Tissue Debridement ([id number]) debridement with total area of 5.07 sq cm performed by Christin Fudge, MD. with the following instrument(s): Forceps to remove Viable and Non-Viable tissue/material including Fibrin/Slough, Eschar, and Subcutaneous after achieving pain control using [name], Kara L. ([id number]) Lidocaine 4% Topical Solution. A time out was conducted prior to the start of the procedure. A Minimum amount of bleeding was controlled with Pressure. The procedure was tolerated well with a pain level of 0 throughout and a pain level of 0 following the procedure. Post Debridement Measurements: 3.9cm length x 1.3cm width x 0.1cm depth; 0.398cm^3 volume. Post procedure Diagnosis Wound #1: Same as Pre-Procedure Wound #3 Wound #3 is a Pressure Ulcer located on the Left,Medial,Dorsal Foot . There was a Skin/Subcutaneous Tissue Debridement  ([id number]) debridement with total area of 1.19 sq cm performed by Christin Fudge, MD. with the following instrument(s): Forceps to remove Viable and Non-Viable tissue/material including Fibrin/Slough, Eschar, and Subcutaneous after achieving pain control using Lidocaine 4% Topical Solution. A time out was conducted prior to the start of the procedure. A Minimum amount of bleeding was controlled with Pressure. The procedure was tolerated well with a pain level of 0 throughout and a pain level of 0 following the procedure. Post Debridement Measurements: 0.7cm length x 1.7cm width x 0.2cm depth; 0.187cm^3 volume. Post debridement Stage noted as Category/Stage III. Post procedure Diagnosis Wound #3: Same as Pre-Procedure Plan Wound Cleansing: Wound #1 Left,Lateral Lower Leg: May Shower, gently pat wound dry prior to applying new dressing. May shower with protection. No tub bath. Wound #3 Left,Medial,Dorsal Foot: May Shower, gently pat wound dry prior to applying new dressing. May shower with protection. No tub bath. Wound #4 Left,Lateral,Dorsal Foot: May Shower, gently pat wound dry  prior to applying new dressing. May shower with protection. No tub bath. Anesthetic: Wound #1 Left,Lateral Lower Leg: Topical Lidocaine 4% cream applied to wound bed prior to debridement Wound #3 Left,Medial,Dorsal Foot: Topical Lidocaine 4% cream applied to wound bed prior to debridement Wound #4 Left,Lateral,Dorsal Foot: Topical Lidocaine 4% cream applied to wound bed prior to debridement Skin Barriers/Peri-Wound Care: Wound #1 Left,Lateral Lower Leg: Barrier cream Wound #3 Left,Medial,Dorsal Foot: Barrier cream ROGENA, DEUPREE. ([id number]) Wound #4 Left,Lateral,Dorsal Foot: Barrier cream Primary Wound Dressing: Wound #1 Left,Lateral Lower Leg: Promogran Mepitel One Wound #3 Left,Medial,Dorsal Foot: Aquacel Ag Wound #4 Left,Lateral,Dorsal Foot: Aquacel Ag Secondary Dressing: Wound #1  Left,Lateral Lower Leg: ABD pad Wound #3 Left,Medial,Dorsal Foot: ABD pad Wound #4 Left,Lateral,Dorsal Foot: ABD pad Dressing Change Frequency: Wound #1 Left,Lateral Lower Leg: Change dressing every week Wound #3 Left,Medial,Dorsal Foot: Change dressing every week Wound #4 Left,Lateral,Dorsal Foot: Change dressing every week Edema Control: Wound #1 Left,Lateral Lower Leg: 3 Layer Compression System - Left Lower Extremity - Profore lite used. Elevate legs to the level of the heart and pump ankles as often as possible Other: - Monitor toes for discoloration;blue/purple Wound #3 Left,Medial,Dorsal Foot: 3 Layer Compression System - Left Lower Extremity - Profore lite used. Elevate legs to the level of the heart and pump ankles as often as possible Other: - Monitor toes for discoloration;blue/purple Wound #4 Left,Lateral,Dorsal Foot: 3 Layer Compression System - Left Lower Extremity - Profore lite used. Elevate legs to the level of the heart and pump ankles as often as possible Other: - Monitor toes for discoloration;blue/purple We will not use the skin substitute and we will use collagen on the superior wound and silver alginate on the wound on the dorsum of the foot. She will use a Profore light compression. Electronic Signature(s) Signed: 12/04/2014 11:02:20 AM By: Christin Fudge MD, FACS ASAY, Dolly Rias ([id number]) Entered By: Christin Fudge on 12/04/2014 11:02:20 [name] ([id number]) -------------------------------------------------------------------------------- SuperBill Details Patient Name: [name] Date of Service: 12/04/2014 Medical Record Number: [id number] Patient Account Number: [id number] Date of Birth/Sex: 01-03-1923 (81 y.o. Female) Treating RN: Montey Hora Primary Care Physician: Park Liter Other Clinician: Referring Physician: Einar Pheasant Treating Physician/Extender: Frann Rider in Treatment: 12 Diagnosis Coding ICD-10  Codes Code Description I70.248 Atherosclerosis of native arteries of left leg with ulceration of other part of lower left leg L97.222 Non-pressure chronic ulcer of left calf with fat layer exposed L03.116 Cellulitis of left lower limb Facility Procedures CPT4: Description Modifier Quantity Code 34196222 11042 - DEB SUBQ TISSUE 20 SQ CM/< 1 ICD-10 Description Diagnosis I70.248 Atherosclerosis of native arteries of left leg with ulceration of other part of lower left leg L97.222 Non-pressure chronic ulcer  of left calf with fat layer exposed L03.116 Cellulitis of left lower limb Physician Procedures CPT4: Description Modifier Quantity Code 9798921 11042 - WC PHYS SUBQ TISS 20 SQ CM 1 ICD-10 Description Diagnosis I70.248 Atherosclerosis of native arteries of left leg with ulceration of other part of lower left leg L97.222 Non-pressure chronic ulcer  of left calf with fat layer exposed L03.116 Cellulitis of left lower limb Electronic Signature(s) Signed: 12/04/2014 11:02:35 AM By: Christin Fudge MD, FACS Entered By: Christin Fudge on 12/04/2014 11:02:34

## 2014-12-05 NOTE — Progress Notes (Signed)
TIWANNA, TUCH ([id number]) Visit Report for 12/04/2014 Arrival Information Details Patient Name: [name], [name]. Date of Service: 12/04/2014 10:15 AM Medical Record Number: [id number] Patient Account Number: [id number] Date of Birth/Sex: 05-09-1922 (81 y.o. Female) Treating RN: Montey Hora Primary Care Physician: Park Liter Other Clinician: Referring Physician: Einar Pheasant Treating Physician/Extender: Frann Rider in Treatment: 12 Visit Information History Since Last Visit Added or deleted any medications: No Patient Arrived: Wheel Chair Any new allergies or adverse reactions: No Arrival Time: 10:26 Had a fall or experienced change in No Accompanied By: dtr activities of daily [name] that may affect Transfer Assistance: Manual risk of falls: Patient Identification Verified: Yes Signs or symptoms of abuse/neglect since last No Secondary Verification Process Yes visito Completed: Hospitalized since last visit: No Patient Requires Transmission- No Pain Present Now: No Based Precautions: Patient Has Alerts: Yes Patient Alerts: Patient on Blood Thinner Eliquis. R ABI:0.96 Electronic Signature(s) Signed: 12/04/2014 5:20:05 PM By: Montey Hora Entered By: Montey Hora on 12/04/2014 10:30:27 [name] ([id number]) -------------------------------------------------------------------------------- Encounter Discharge Information Details Patient Name: [name]. Date of Service: 12/04/2014 10:15 AM Medical Record Number: [id number] Patient Account Number: [id number] Date of Birth/Sex: [birth year] (81 y.o. Female) Treating RN: Montey Hora Primary Care Physician: Park Liter Other Clinician: Referring Physician: Einar Pheasant Treating Physician/Extender: Frann Rider in Treatment: 12 Encounter Discharge Information Items Schedule Follow-up Appointment: No Medication Reconciliation completed and provided to Patient/Care  No Alphonza Tramell: Provided on Clinical Summary of Care: 12/04/2014 Form Type Recipient Paper Patient LS Electronic Signature(s) Signed: 12/04/2014 11:18:09 AM By: Ruthine Dose Entered By: Ruthine Dose on 12/04/2014 11:18:09 [name] ([id number]) -------------------------------------------------------------------------------- Lower Extremity Assessment Details Patient Name: [name]. Date of Service: 12/04/2014 10:15 AM Medical Record Number: [id number] Patient Account Number: [id number] Date of Birth/Sex: [birth year] (81 y.o. Female) Treating RN: Montey Hora Primary Care Physician: Park Liter Other Clinician: Referring Physician: Einar Pheasant Treating Physician/Extender: Frann Rider in Treatment: 12 Edema Assessment Assessed: [Left: No] [Right: No] Edema: [Left: Ye] [Right: s] Calf Left: Right: Point of Measurement: 37 cm From Medial Instep 34.9 cm cm Ankle Left: Right: Point of Measurement: 15 cm From Medial Instep 23.3 cm cm Vascular Assessment Pulses: Posterior Tibial Dorsalis Pedis Palpable: [Left:Yes] Extremity colors, hair growth, and conditions: Extremity Color: [Left:Normal] Hair Growth on Extremity: [Left:No] Temperature of Extremity: [Left:Warm] Capillary Refill: [Left:< 3 seconds] Toe Nail Assessment Left: Right: Thick: Yes Discolored: Yes Deformed: Yes Improper Length and Hygiene: Yes Electronic Signature(s) Signed: 12/04/2014 5:20:05 PM By: Montey Hora Entered By: Montey Hora on 12/04/2014 10:39:21 [name] ([id number]) -------------------------------------------------------------------------------- Multi Wound Chart Details Patient Name: [name]. Date of Service: 12/04/2014 10:15 AM Medical Record Number: [id number] Patient Account Number: [id number] Date of Birth/Sex: 14-Jul-1922 (81 y.o. Female) Treating RN: Montey Hora Primary Care Physician: Park Liter Other Clinician: Referring  Physician: Einar Pheasant Treating Physician/Extender: Frann Rider in Treatment: 12 Vital Signs Height(in): 67 Pulse(bpm): 76 Weight(lbs): 159 Blood Pressure 130/70 (mmHg): Body Mass Index(BMI): 25 Temperature(F): 97.4 Respiratory Rate 20 (breaths/min): Photos: [1:No Photos] [3:No Photos] [4:No Photos] Wound Location: [1:Left Lower Leg - Lateral Left Foot - Dorsal, Medial] [4:Left Foot - Dorsal, Lateral] Wounding Event: [1:Gradually Appeared] [3:Pressure Injury] [4:Shear/Friction] Primary Etiology: [1:Arterial Insufficiency Ulcer Pressure Ulcer] [4:Pressure Ulcer] Secondary Etiology: [1:Cellulitis] [3:N/A] [4:N/A] Comorbid History: [1:Cataracts, Arrhythmia, Hypertension, Peripheral Hypertension, Peripheral Arterial Disease, Peripheral Venous Disease, Osteoarthritis, Disease, Osteoarthritis, Confinement Anxiety] [3:Cataracts, Arrhythmia, Arterial Disease,  Peripheral Venous Confinement Anxiety] [4:Cataracts, Arrhythmia, Hypertension, Peripheral Arterial Disease, Peripheral Venous  Disease, Osteoarthritis, Confinement Anxiety] Date Acquired: [1:08/17/2014] [3:11/12/2014] [4:11/12/2014] Weeks of Treatment: [1:12] [3:2] [4:2] Wound Status: [1:Open] [3:Open] [4:Open] Measurements L x W x D 3.9x1.3x0.1 [3:0.7x1.7x0.2] [4:0.3x0.7x0.2] (cm) Area (cm) : [1:3.982] [3:0.935] [4:0.165] Volume (cm) : [1:0.398] [3:0.187] [4:0.033] % Reduction in Area: [1:84.90%] [3:40.50%] [4:85.00%] % Reduction in Volume: 92.50% [3:-19.10%] [4:70.00%] Classification: [1:Full Thickness Without Exposed Support Structures] [3:Category/Stage III] [4:Category/Stage III] Exudate Amount: [1:Medium] [3:Large] [4:Medium] Exudate Type: [1:Serous] [3:Serous] [4:Serous] Exudate Color: [1:amber] [3:amber] [4:amber] Wound Margin: [1:Flat and Intact] [3:Flat and Intact] [4:Flat and Intact] Granulation Amount: [1:Large (67-100%)] [3:Medium (34-66%)] [4:Small (1-33%)] Granulation Quality: [1:Red] [3:Pink]  [4:Pink] Necrotic Amount: [1:Small (1-33%)] [3:Medium (34-66%)] [4:Large (67-100%)] Necrotic Tissue: Eschar, Adherent Slough Eschar, Adherent [facility], Adherent Slough Exposed Structures: Fascia: No Fascia: No Fascia: No Fat: No Fat: No Fat: No Tendon: No Tendon: No Tendon: No Muscle: No Muscle: No Muscle: No Joint: No Joint: No Joint: No Bone: No Bone: No Bone: No Limited to Skin Limited to Skin Limited to Skin Breakdown Breakdown Breakdown Epithelialization: Medium (34-66%) None None Periwound Skin Texture: Edema: No Edema: No Edema: No Excoriation: No Excoriation: No Excoriation: No Induration: No Induration: No Induration: No Callus: No Callus: No Callus: No Crepitus: No Crepitus: No Crepitus: No Fluctuance: No Fluctuance: No Fluctuance: No Friable: No Friable: No Friable: No Rash: No Rash: No Rash: No Scarring: No Scarring: No Scarring: No Periwound Skin Maceration: No Maceration: No Maceration: No Moisture: Moist: No Moist: No Moist: No Dry/Scaly: No Dry/Scaly: No Dry/Scaly: No Periwound Skin Color: Atrophie Blanche: No Atrophie Blanche: No Atrophie Blanche: No Cyanosis: No Cyanosis: No Cyanosis: No Ecchymosis: No Ecchymosis: No Ecchymosis: No Erythema: No Erythema: No Erythema: No Hemosiderin Staining: No Hemosiderin Staining: No Hemosiderin Staining: No Mottled: No Mottled: No Mottled: No Pallor: No Pallor: No Pallor: No Rubor: No Rubor: No Rubor: No Temperature: N/A N/A No Abnormality Tenderness on No No No Palpation: Wound Preparation: Ulcer Cleansing: Other: Ulcer Cleansing: Other: Ulcer Cleansing: Other: soap and water soap and water soap and water Topical Anesthetic Topical Anesthetic Topical Anesthetic Applied: Other: lidocaine Applied: Other: lidocaine Applied: Other: lidocaine 4% 4% 4% Treatment Notes Electronic Signature(s) Signed: 12/04/2014 5:20:05 PM By: Montey Hora Entered By: Montey Hora on  12/04/2014 10:48:20 [name] ([id number]) -------------------------------------------------------------------------------- [place] Details Patient Name: [name] Date of Service: 12/04/2014 10:15 AM Medical Record Number: [id number] Patient Account Number: [id number] Date of Birth/Sex: [birth year] (81 y.o. Female) Treating RN: Montey Hora Primary Care Physician: Park Liter Other Clinician: Referring Physician: Einar Pheasant Treating Physician/Extender: Frann Rider in Treatment: 12 Active Inactive Abuse / Safety / Falls / Self Care Management Nursing Diagnoses: Potential for falls Goals: Patient will remain injury free Date Initiated: 09/07/2014 Goal Status: Active Interventions: Assess fall risk on admission and as needed Notes: Nutrition Nursing Diagnoses: Potential for alteratiion in Nutrition/Potential for imbalanced nutrition Goals: Patient/caregiver agrees to and verbalizes understanding of need to use nutritional supplements and/or vitamins as prescribed Date Initiated: 09/07/2014 Goal Status: Active Interventions: Assess patient nutrition upon admission and as needed per policy Notes: Orientation to the Wound Care Program Nursing Diagnoses: Knowledge deficit related to the wound healing center program Goals: Patient/caregiver will verbalize understanding of the [place], Shakayla L. ([id number]) Date Initiated: 09/07/2014 Goal Status: Active Interventions: Provide education on orientation to the wound center Notes: Wound/Skin Impairment Nursing Diagnoses: Impaired tissue integrity Goals: Patient/caregiver will verbalize understanding of skin care regimen Date Initiated: 09/07/2014 Goal Status: Active Ulcer/skin breakdown will heal  within 14 weeks Date Initiated: 09/07/2014 Goal Status: Active Interventions: Assess patient/caregiver ability to obtain necessary supplies Assess  ulceration(s) every visit Notes: Electronic Signature(s) Signed: 12/04/2014 5:20:05 PM By: Montey Hora Entered By: Montey Hora on 12/04/2014 10:48:13 [name] ([id number]) -------------------------------------------------------------------------------- Patient/Caregiver Education Details Patient Name: [name]. Date of Service: 12/04/2014 10:15 AM Medical Record Number: [id number] Patient Account Number: [id number] Date of Birth/Gender: 02/04/1923 (81 y.o. Female) Treating RN: Montey Hora Primary Care Physician: Park Liter Other Clinician: Referring Physician: Einar Pheasant Treating Physician/Extender: Frann Rider in Treatment: 12 Education Assessment Education Provided To: Patient Education Topics Provided Venous: Handouts: Other: need for compliance with compression hose to right leg Methods: Explain/Verbal Responses: State content correctly Electronic Signature(s) Signed: 12/04/2014 5:20:05 PM By: Montey Hora Entered By: Montey Hora on 12/04/2014 10:54:55 [name] ([id number]) -------------------------------------------------------------------------------- Wound Assessment Details Patient Name: [name]. Date of Service: 12/04/2014 10:15 AM Medical Record Number: [id number] Patient Account Number: [id number] Date of Birth/Sex: Sep 05, 1922 (81 y.o. Female) Treating RN: Montey Hora Primary Care Physician: Park Liter Other Clinician: Referring Physician: Einar Pheasant Treating Physician/Extender: Frann Rider in Treatment: 12 Wound Status Wound Number: 1 Primary Arterial Insufficiency Ulcer Etiology: Wound Location: Left Lower Leg - Lateral Secondary Cellulitis Wounding Event: Gradually Appeared Etiology: Date Acquired: 08/17/2014 Wound Open Weeks Of Treatment: 12 Status: Clustered Wound: No Comorbid Cataracts, Arrhythmia, Hypertension, History: Peripheral Arterial Disease, Peripheral Venous  Disease, Osteoarthritis, Confinement Anxiety Photos Photo Uploaded By: Montey Hora on 12/04/2014 12:05:42 Wound Measurements Length: (cm) 3.9 Width: (cm) 1.3 Depth: (cm) 0.1 Area: (cm) 3.982 Volume: (cm) 0.398 % Reduction in Area: 84.9% % Reduction in Volume: 92.5% Epithelialization: Medium (34-66%) Tunneling: No Undermining: No Wound Description Full Thickness Without Exposed Classification: Support Structures Wound Margin: Flat and Intact Exudate Medium Amount: Exudate Type: Serous Exudate Color: amber AYZIA, DAY ([id number]) Foul Odor After Cleansing: No Wound Bed Granulation Amount: Large (67-100%) Exposed Structure Granulation Quality: Red Fascia Exposed: No Necrotic Amount: Small (1-33%) Fat Layer Exposed: No Necrotic Quality: Eschar, Adherent Slough Tendon Exposed: No Muscle Exposed: No Joint Exposed: No Bone Exposed: No Limited to Skin Breakdown Periwound Skin Texture Texture Color No Abnormalities Noted: No No Abnormalities Noted: No Callus: No Atrophie Blanche: No Crepitus: No Cyanosis: No Excoriation: No Ecchymosis: No Fluctuance: No Erythema: No Friable: No Hemosiderin Staining: No Induration: No Mottled: No Localized Edema: No Pallor: No Rash: No Rubor: No Scarring: No Moisture No Abnormalities Noted: No Dry / Scaly: No Maceration: No Moist: No Wound Preparation Ulcer Cleansing: Other: soap and water, Topical Anesthetic Applied: Other: lidocaine 4%, Treatment Notes Wound #1 (Left, Lateral Lower Leg) 1. Cleansed with: Cleanse wound with antibacterial soap and water 2. Anesthetic Topical Lidocaine 4% cream to wound bed prior to debridement 4. Dressing Applied: Promogran 5. Secondary Dressing Applied ABD Pad 7. Secured with 3 Layer Compression System - Left Lower Extremity Electronic Signature(s) YOVANNA, COGAN ([id number]) Signed: 12/04/2014 5:20:05 PM By: Montey Hora Entered By: Montey Hora on  12/04/2014 10:46:53 [name] ([id number]) -------------------------------------------------------------------------------- Wound Assessment Details Patient Name: [name]. Date of Service: 12/04/2014 10:15 AM Medical Record Number: [id number] Patient Account Number: [id number] Date of Birth/Sex: 1922/06/17 (81 y.o. Female) Treating RN: Montey Hora Primary Care Physician: Park Liter Other Clinician: Referring Physician: Einar Pheasant Treating Physician/Extender: Frann Rider in Treatment: 12 Wound Status Wound Number: 3 Primary Pressure Ulcer Etiology: Wound Location: Left Foot - Dorsal, Medial Wound Open Wounding Event: Pressure Injury Status: Date Acquired: 11/12/2014 Comorbid Cataracts,  Arrhythmia, Hypertension, Weeks Of Treatment: 2 History: Peripheral Arterial Disease, Peripheral Clustered Wound: No Venous Disease, Osteoarthritis, Confinement Anxiety Photos Photo Uploaded By: Montey Hora on 12/04/2014 12:05:44 Wound Measurements Length: (cm) 0.7 Width: (cm) 1.7 Depth: (cm) 0.2 Area: (cm) 0.935 Volume: (cm) 0.187 % Reduction in Area: 40.5% % Reduction in Volume: -19.1% Epithelialization: None Tunneling: No Undermining: No Wound Description Classification: Category/Stage III Wound Margin: Flat and Intact Exudate Amount: Large Exudate Type: Serous Exudate Color: amber Foul Odor After Cleansing: No Wound Bed Granulation Amount: Medium (34-66%) Exposed Structure Granulation Quality: Pink Fascia Exposed: No Necrotic Amount: Medium (34-66%) Fat Layer Exposed: No Breck, Leighton L. ([id number]) Necrotic Quality: Eschar, Adherent Slough Tendon Exposed: No Muscle Exposed: No Joint Exposed: No Bone Exposed: No Limited to Skin Breakdown Periwound Skin Texture Texture Color No Abnormalities Noted: No No Abnormalities Noted: No Callus: No Atrophie Blanche: No Crepitus: No Cyanosis: No Excoriation: No Ecchymosis:  No Fluctuance: No Erythema: No Friable: No Hemosiderin Staining: No Induration: No Mottled: No Localized Edema: No Pallor: No Rash: No Rubor: No Scarring: No Moisture No Abnormalities Noted: No Dry / Scaly: No Maceration: No Moist: No Wound Preparation Ulcer Cleansing: Other: soap and water, Topical Anesthetic Applied: Other: lidocaine 4%, Treatment Notes Wound #3 (Left, Medial, Dorsal Foot) 1. Cleansed with: Cleanse wound with antibacterial soap and water 2. Anesthetic Topical Lidocaine 4% cream to wound bed prior to debridement 4. Dressing Applied: Aquacel Ag 5. Secondary Dressing Applied ABD Pad 7. Secured with 3 Layer Compression System - Left Lower Extremity Electronic Signature(s) Signed: 12/04/2014 5:20:05 PM By: Montey Hora Entered By: Montey Hora on 12/04/2014 10:47:30 [name] ([id number]) -------------------------------------------------------------------------------- Wound Assessment Details Patient Name: [name]. Date of Service: 12/04/2014 10:15 AM Medical Record Number: [id number] Patient Account Number: [id number] Date of Birth/Sex: 08-Mar-1922 (81 y.o. Female) Treating RN: Montey Hora Primary Care Physician: Park Liter Other Clinician: Referring Physician: Einar Pheasant Treating Physician/Extender: Frann Rider in Treatment: 12 Wound Status Wound Number: 4 Primary Pressure Ulcer Etiology: Wound Location: Left Foot - Dorsal, Lateral Wound Open Wounding Event: Shear/Friction Status: Date Acquired: 11/12/2014 Comorbid Cataracts, Arrhythmia, Hypertension, Weeks Of Treatment: 2 History: Peripheral Arterial Disease, Peripheral Clustered Wound: No Venous Disease, Osteoarthritis, Confinement Anxiety Photos Photo Uploaded By: Montey Hora on 12/04/2014 12:06:03 Wound Measurements Length: (cm) 0.3 Width: (cm) 0.7 Depth: (cm) 0.2 Area: (cm) 0.165 Volume: (cm) 0.033 % Reduction in Area: 85% %  Reduction in Volume: 70% Epithelialization: None Tunneling: No Undermining: No Wound Description Classification: Category/Stage III Wound Margin: Flat and Intact Exudate Amount: Medium Exudate Type: Serous Exudate Color: amber Foul Odor After Cleansing: No Wound Bed Granulation Amount: Small (1-33%) Exposed Structure Granulation Quality: Pink Fascia Exposed: No Necrotic Amount: Large (67-100%) Fat Layer Exposed: No Friis, Renesmee L. ([id number]) Necrotic Quality: Eschar, Adherent Slough Tendon Exposed: No Muscle Exposed: No Joint Exposed: No Bone Exposed: No Limited to Skin Breakdown Periwound Skin Texture Texture Color No Abnormalities Noted: No No Abnormalities Noted: No Callus: No Atrophie Blanche: No Crepitus: No Cyanosis: No Excoriation: No Ecchymosis: No Fluctuance: No Erythema: No Friable: No Hemosiderin Staining: No Induration: No Mottled: No Localized Edema: No Pallor: No Rash: No Rubor: No Scarring: No Temperature / Pain Moisture Temperature: No Abnormality No Abnormalities Noted: No Dry / Scaly: No Maceration: No Moist: No Wound Preparation Ulcer Cleansing: Other: soap and water, Topical Anesthetic Applied: Other: lidocaine 4%, Treatment Notes Wound #4 (Left, Lateral, Dorsal Foot) 1. Cleansed with: Cleanse wound with antibacterial soap and water 2. Anesthetic Topical Lidocaine  4% cream to wound bed prior to debridement 4. Dressing Applied: Aquacel Ag 5. Secondary Dressing Applied ABD Pad 7. Secured with 3 Layer Compression System - Left Lower Extremity Electronic Signature(s) Signed: 12/04/2014 5:20:05 PM By: Montey Hora Entered By: Montey Hora on 12/04/2014 10:48:04 [name] ([id number]) -------------------------------------------------------------------------------- [place] Details Patient Name: [name] Date of Service: 12/04/2014 10:15 AM Medical Record Number: [id number] Patient Account Number:  [id number] Date of Birth/Sex: 14-Jun-1922 (81 y.o. Female) Treating RN: Montey Hora Primary Care Physician: Park Liter Other Clinician: Referring Physician: Einar Pheasant Treating Physician/Extender: Frann Rider in Treatment: 12 Vital Signs Time Taken: 10:30 Temperature (F): 97.4 Height (in): 67 Pulse (bpm): 76 Weight (lbs): 159 Respiratory Rate (breaths/min): 20 Body Mass Index (BMI): 24.9 Blood Pressure (mmHg): 130/70 Reference Range: 80 - 120 mg / dl Electronic Signature(s) Signed: 12/04/2014 5:20:05 PM By: Montey Hora Entered By: Montey Hora on 12/04/2014 10:33:31

## 2014-12-06 ENCOUNTER — Telehealth: Payer: Self-pay | Admitting: Family Medicine

## 2014-12-06 NOTE — Telephone Encounter (Signed)
Noted  

## 2014-12-06 NOTE — Telephone Encounter (Signed)
Home health aid called to let us know that the pt is refusing services.

## 2014-12-07 ENCOUNTER — Encounter: Payer: Self-pay | Admitting: Emergency Medicine

## 2014-12-07 ENCOUNTER — Emergency Department
Admission: EM | Admit: 2014-12-07 | Discharge: 2014-12-07 | Disposition: A | Discharge status: Home / Self Care | Payer: Medicare Other | Attending: Emergency Medicine | Admitting: Emergency Medicine

## 2014-12-07 ENCOUNTER — Emergency Department: Payer: Medicare Other

## 2014-12-07 ENCOUNTER — Other Ambulatory Visit: Payer: Self-pay

## 2014-12-07 DIAGNOSIS — M6250 Muscle wasting and atrophy, not elsewhere classified, unspecified site: Secondary | ICD-10-CM | POA: Diagnosis not present

## 2014-12-07 DIAGNOSIS — M6281 Muscle weakness (generalized): Secondary | ICD-10-CM | POA: Insufficient documentation

## 2014-12-07 DIAGNOSIS — Z79899 Other long term (current) drug therapy: Secondary | ICD-10-CM | POA: Diagnosis not present

## 2014-12-07 DIAGNOSIS — E119 Type 2 diabetes mellitus without complications: Secondary | ICD-10-CM | POA: Insufficient documentation

## 2014-12-07 DIAGNOSIS — R531 Weakness: Secondary | ICD-10-CM

## 2014-12-07 DIAGNOSIS — F0390 Unspecified dementia without behavioral disturbance: Secondary | ICD-10-CM | POA: Insufficient documentation

## 2014-12-07 DIAGNOSIS — I499 Cardiac arrhythmia, unspecified: Secondary | ICD-10-CM | POA: Insufficient documentation

## 2014-12-07 DIAGNOSIS — R5381 Other malaise: Secondary | ICD-10-CM

## 2014-12-07 DIAGNOSIS — R4182 Altered mental status, unspecified: Secondary | ICD-10-CM | POA: Diagnosis present

## 2014-12-07 DIAGNOSIS — I10 Essential (primary) hypertension: Secondary | ICD-10-CM | POA: Insufficient documentation

## 2014-12-07 LAB — COMPREHENSIVE METABOLIC PANEL
ALK PHOS: 90 U/L (ref 38–126)
ALT: 28 U/L (ref 14–54)
AST: 36 U/L (ref 15–41)
Albumin: 4.1 g/dL (ref 3.5–5.0)
Anion gap: 11 (ref 5–15)
BILIRUBIN TOTAL: 1.2 mg/dL (ref 0.3–1.2)
BUN: 28 mg/dL — AB (ref 6–20)
CALCIUM: 9.6 mg/dL (ref 8.9–10.3)
CO2: 26 mmol/L (ref 22–32)
CREATININE: 1.01 mg/dL — AB (ref 0.44–1.00)
Chloride: 100 mmol/L — ABNORMAL LOW (ref 101–111)
GFR calc Af Amer: 55 mL/min — ABNORMAL LOW (ref >60)
GFR calc non Af Amer: 48 mL/min — ABNORMAL LOW (ref >60)
Glucose, Bld: 164 mg/dL — ABNORMAL HIGH (ref 65–99)
Potassium: 4.2 mmol/L (ref 3.5–5.1)
Sodium: 137 mmol/L (ref 135–145)
Total Protein: 6.7 g/dL (ref 6.5–8.1)

## 2014-12-07 LAB — CBC
HCT: 40.1 % (ref 35.0–47.0)
Hemoglobin: 12.7 g/dL (ref 12.0–16.0)
MCH: 25.8 pg — ABNORMAL LOW (ref 26.0–34.0)
MCHC: 31.7 g/dL — ABNORMAL LOW (ref 32.0–36.0)
MCV: 81.3 fL (ref 80.0–100.0)
PLATELETS: 226 10*3/uL (ref 150–440)
RBC: 4.93 MIL/uL (ref 3.80–5.20)
RDW: 15.9 % — ABNORMAL HIGH (ref 11.5–14.5)
WBC: 9.3 10*3/uL (ref 3.6–11.0)

## 2014-12-07 LAB — URINALYSIS COMPLETE WITH MICROSCOPIC (ARMC ONLY)
Bilirubin Urine: NEGATIVE
Glucose, UA: NEGATIVE mg/dL
KETONES UR: NEGATIVE mg/dL
LEUKOCYTES UA: NEGATIVE
Nitrite: NEGATIVE
Protein, ur: 30 mg/dL — AB
Specific Gravity, Urine: 1.012 (ref 1.005–1.030)
pH: 5 (ref 5.0–8.0)

## 2014-12-07 MED ORDER — SODIUM CHLORIDE 0.9 % IV BOLUS (SEPSIS)
500.0000 mL | Freq: Once | INTRAVENOUS | Status: AC
  Administered 2014-12-07: 500 mL via INTRAVENOUS

## 2014-12-07 NOTE — ED Notes (Signed)
Waiting on ems transport.  °

## 2014-12-07 NOTE — ED Notes (Signed)
LLE with dressing that per pt the wound center is following. Sacrum red, several small areas partial skin thickness. No s/s of infection

## 2014-12-07 NOTE — Discharge Instructions (Signed)
Please follow-up with your doctor and home health team. Return to the emergency room if he develop a fever, severe pain, new numbness or weakness in arm or leg, trouble breathing, chest pain or other new concerns arise.  Weakness Weakness is a lack of strength. It may be felt all over the body (generalized) or in one specific part of the body (focal). Some causes of weakness can be serious. You may need further medical evaluation, especially if you are elderly or you have a history of immunosuppression (such as chemotherapy or HIV), kidney disease, heart disease, or diabetes. CAUSES  Weakness can be caused by many different things, including:  Infection.  Physical exhaustion.  Internal bleeding or other blood loss that results in a lack of red blood cells (anemia).  Dehydration. This cause is more common in elderly people.  Side effects or electrolyte abnormalities from medicines, such as pain medicines or sedatives.  Emotional distress, anxiety, or depression.  Circulation problems, especially severe peripheral arterial disease.  Heart disease, such as rapid atrial fibrillation, bradycardia, or heart failure.  Nervous system disorders, such as Guillain-Barr syndrome, multiple sclerosis, or stroke. DIAGNOSIS  To find the cause of your weakness, your caregiver will take your history and perform a physical exam. Lab tests or X-rays may also be ordered, if needed. TREATMENT  Treatment of weakness depends on the cause of your symptoms and can vary greatly. HOME CARE INSTRUCTIONS   Rest as needed.  Eat a well-balanced diet.  Try to get some exercise every day.  Only take over-the-counter or prescription medicines as directed by your caregiver. SEEK MEDICAL CARE IF:   Your weakness seems to be getting worse or spreads to other parts of your body.  You develop new aches or pains. SEEK IMMEDIATE MEDICAL CARE IF:   You cannot perform your normal daily activities, such as getting  dressed and feeding yourself.  You cannot walk up and down stairs, or you feel exhausted when you do so.  You have shortness of breath or chest pain.  You have difficulty moving parts of your body.  You have weakness in only one area of the body or on only one side of the body.  You have a fever.  You have trouble speaking or swallowing.  You cannot control your bladder or bowel movements.  You have black or bloody vomit or stools. MAKE SURE YOU:  Understand these instructions.  Will watch your condition.  Will get help right away if you are not doing well or get worse.   This information is not intended to replace advice given to you by your health care provider. Make sure you discuss any questions you have with your health care provider.   Document Released: 02/02/2005 Document Revised: 08/04/2011 Document Reviewed: 04/03/2011 Elsevier Interactive Patient Education [employer].

## 2014-12-07 NOTE — ED Provider Notes (Signed)
[facility] Emergency Department Provider Note REMINDER - THIS NOTE IS NOT A FINAL MEDICAL RECORD UNTIL IT IS SIGNED. UNTIL THEN, THE CONTENT BELOW MAY REFLECT INFORMATION FROM A DOCUMENTATION TEMPLATE, NOT THE ACTUAL PATIENT VISIT. ____________________________________________  Time seen: Approximately 12:28 PM  I have reviewed the triage vital signs and the nursing notes.   HISTORY  Chief Complaint Altered Mental Status    HPI [name] is a 81 y.o. female was recently released from a nursing care about one month ago. Since that time she has been following with family medicine in the wound clinic for lower extremity swelling and weakness. Her daughter brought her in today as the patient has had about 2-3 weeks of being very tired, at times seemingly not wanting to wake up from bed. She has, however awoke and well today is eating well, staying hydrated, and does walk with assistance of a walker at home. She denies being in pain except for along her left back for which she has chronic pain in the Lidoderm patch. No fevers or chills, no nausea or vomiting. No headache, family has not noticed any new weakness aside from generally feeling fatigued.  Patient is seen weekly the wound care center continue to follow her left leg wounds.  Past Medical History  Diagnosis Date  . Type II or unspecified type diabetes mellitus without mention of complication, not stated as uncontrolled   . Essential hypertension, benign   . Anemia, unspecified   . Atrial fibrillation ([place])   . Hypercholesterolemia   . Osteoarthritis   . History of TIAs   . CVA (cerebral infarction)   . Atrial fibrillation ([place])   . Uterine cancer ([facility])     s/p TAH/BSO  . CAD (coronary artery disease)     s/p CABG 10/99 with LIMA to the LAD, SVG to OM1, SVG to distal RCA  . Cellulitis     Patient Active Problem List   Diagnosis Date Noted  . Hypothyroidism 12/03/2014  . Elevated TSH 12/03/2014   . Thrombocytopenia ([place]) 11/30/2014  . Cellulitis 08/14/2014  . Cellulitis of left leg 08/13/2014  . Rash 07/02/2014  . Edema 01/28/2014  . Toenail fungus 06/25/2013  . CVA (cerebral vascular accident) ([place]) 04/02/2013  . ICH (intracerebral hemorrhage) on anticoagulation, after recent LPCA infarct 01/26/2013  . Neuropathy ([place]) 12/18/2012  . Hyponatremia 06/19/2012  . Atrial fibrillation ([place]) 06/17/2012  . Essential hypertension, benign 06/17/2012  . CAD (coronary artery disease) 06/17/2012  . Peripheral vascular disease ([place]) 06/17/2012  . Diabetes ([place]) 06/17/2012  . Hypercholesterolemia 06/17/2012  . Depression 06/17/2012    Past Surgical History  Procedure Laterality Date  . Total abdominal hysterectomy w/ bilateral salpingoophorectomy  1969  . Coronary artery bypass graft  1999    x4  . Cataract extraction      Right eye  . Peripheral vascular catheterization N/A 09/20/2014    Procedure: Lower Extremity Angiography;  Surgeon: Algernon Huxley, MD;  Location: [place] [facility];  Service: Cardiovascular;  Laterality: N/A;  . Peripheral vascular catheterization  09/20/2014    Procedure: Lower Extremity Intervention;  Surgeon: Algernon Huxley, MD;  Location: [place] [facility];  Service: Cardiovascular;;    Current Outpatient Rx  Name  Route  Sig  Dispense  Refill  . acidophilus (RISAQUAD) CAPS capsule   Oral   Take 1 capsule by mouth daily. For 14 days         . ALPRAZolam (XANAX) 0.25 MG tablet  Oral   Take 0.25 mg by mouth 2 (two) times daily as needed for anxiety.         . citalopram (CELEXA) 10 MG tablet               . docusate sodium (COLACE) 100 MG capsule   Oral   Take 100 mg by mouth at bedtime as needed for mild constipation.         Marland Kitchen doxycycline (VIBRAMYCIN) 100 MG capsule   Oral   Take 100 mg by mouth 2 (two) times daily. For 10 days         . ELIQUIS 2.5 MG TABS tablet      TAKE 1 TABLET BY MOUTH TWICE A DAY   60 tablet   5   .  furosemide (LASIX) 40 MG tablet   Oral   Take 1 tablet (40 mg total) by mouth daily.   30 tablet   3   . HYDROcodone-acetaminophen (NORCO/VICODIN) 5-325 MG per tablet   Oral   Take 1 tablet by mouth every 4 (four) hours as needed for moderate pain.         Marland Kitchen lidocaine (LIDODERM) 5 %   Transdermal   Place 1 patch onto the skin daily. Remove & Discard patch within 12 hours or as directed by MD   90 patch   3   . lisinopril (PRINIVIL,ZESTRIL) 40 MG tablet   Oral   Take 1 tablet (40 mg total) by mouth every morning.   90 tablet   1   . metoprolol tartrate (LOPRESSOR) 25 MG tablet   Oral   Take 1 tablet (25 mg total) by mouth 2 (two) times daily. Patient taking differently: Take 37.5 mg by mouth 2 (two) times daily.    180 tablet   3   . mineral oil-hydrophilic petrolatum (AQUAPHOR) ointment   Topical   Apply 1 application topically daily. Apply to feet/lower legs         . nystatin cream (MYCOSTATIN)   Topical   Apply 1 application topically 2 (two) times daily. Patient taking differently: Apply 1 application topically 2 (two) times daily. Apply under breasts for 14 days   30 g   0   . potassium chloride (MICRO-K) 10 MEQ CR capsule   Oral   Take 1 capsule (10 mEq total) by mouth daily.   30 capsule   3   . senna-docusate (SENOKOT-S) 8.6-50 MG per tablet   Oral   Take 1 tablet by mouth 2 (two) times daily. Patient taking differently: Take 1 tablet by mouth at bedtime as needed for mild constipation.    60 tablet   3   . sodium chloride 0.9 % infusion   Intravenous   Inject 75 mLs into the vein once. Patient not taking: Reported on 09/20/2014   75 mL   0     Allergies Erythromycin ethylsuccinate and Mycinette  Family History  Problem Relation Age of Onset  . Heart disease Mother 80    Died of heart attack  . Stroke Father 93    died of cva  . CAD Brother     multiple brothers    Social History Social History  Substance Use Topics  . Smoking  status: Never Smoker   . Smokeless tobacco: Never Used  . Alcohol Use: No    Review of Systems Constitutional: No fever/chills. Patient is generally tired though. Eyes: No visual changes. ENT: No sore throat. Cardiovascular: Denies chest pain.  Respiratory: Denies shortness of breath. Gastrointestinal: No abdominal pain.  No nausea, no vomiting.  No diarrhea.  No constipation. Genitourinary: Negative for dysuria. Musculoskeletal: Negative for back pain. Skin: Negative for rash. Neurological: Negative for headaches, focal weakness or numbness.  10-point ROS otherwise negative.  ____________________________________________   PHYSICAL EXAM:  VITAL SIGNS: ED Triage Vitals  Enc Vitals Group     BP 12/07/14 1147 148/78 mmHg     Pulse Rate 12/07/14 1147 84     Resp 12/07/14 1200 23     Temp 12/07/14 1147 97.8 F (36.6 C)     Temp Source 12/07/14 1147 Oral     SpO2 12/07/14 1147 97 %     Weight 12/07/14 1147 155 lb (70.308 kg)     Height 12/07/14 1147 5\' 5"  (1.651 m)     Head Cir --      Peak Flow --      Pain Score --      Pain Loc --      Pain Edu? --      Excl. in [place]? --    Constitutional: Alert and oriented. Somewhat frail and chronically ill appearing and in no acute distress. She is oriented to self and daughter, but not to date. Daughter states this is normal and they had diagnosed her with some mild dementia. Daughter also reports that she is behaving normally at this time, but at times seems very tired. Eyes: Conjunctivae are normal. PERRL. EOMI. Head: Atraumatic. Nose: No congestion/rhinnorhea. Mouth/Throat: Mucous membranes are very slightly dry.  Oropharynx non-erythematous. Neck: No stridor.   Cardiovascular: Irregular rate, irregular rhythm. Grossly normal heart sounds.  Good peripheral circulation. Respiratory: Normal respiratory effort.  No retractions. Lungs CTAB. Gastrointestinal: Soft and nontender. No distention. No abdominal bruits. No CVA  tenderness. Musculoskeletal: No lower extremity tenderness nor edema.  No joint effusions. Neurologic:  Normal speech and language. No gross focal neurologic deficits are appreciated. Patient has about 4-5 strength in all extremities. Cranial nerves are normal. Normal sensory exam without any obvious focal deficits. Skin:  Skin is warm, dry and intact. No rash noted. Left lower extremity has an Haematologist in place. The patient does have 2+ lower extremity edema bilaterally, there is some very minimal erythema over the toes of the right foot with normal capillary refill. The skin is not shiny raised, or suggestive of acute cellulitis. The patient does have some very minimal erythema over the sacrum, but no open ulcer wound. Psychiatric: Mood and affect are called. Speech and behavior are normal.  ____________________________________________   LABS (all labs ordered are listed, but only abnormal results are displayed)  Labs Reviewed  COMPREHENSIVE METABOLIC PANEL - Abnormal; Notable for the following:    Chloride 100 (*)    Glucose, Bld 164 (*)    BUN 28 (*)    Creatinine, Ser 1.01 (*)    GFR calc non Af Amer 48 (*)    GFR calc Af Amer 55 (*)    All other components within normal limits  CBC - Abnormal; Notable for the following:    MCH 25.8 (*)    MCHC 31.7 (*)    RDW 15.9 (*)    All other components within normal limits  URINALYSIS COMPLETEWITH MICROSCOPIC ([facility] ONLY) - Abnormal; Notable for the following:    Color, Urine YELLOW (*)    APPearance CLEAR (*)    Hgb urine dipstick 3+ (*)    Protein, ur 30 (*)    Bacteria, UA RARE (*)  Squamous Epithelial / LPF 0-5 (*)    All other components within normal limits   ____________________________________________  EKG  Reviewed and interpreted by me EKG time 1410 Atrial fibrillation with ventricular rate 90 Evidence of probable old anterior infarct, minimal T-wave in her [place] seen in inferolateral leads  Intervals normal  compared with the patient's previous EKG from 01/03/2014, patient is still in atrial fibrillation, overall T-wave abnormality appear slightly improved ____________________________________________  RADIOLOGY  IMPRESSION: Old left frontal and occipital infarcts with encephalomalacia. No acute intracranial abnormality. ____________________________________________   PROCEDURES  Procedure(s) performed: None  Critical Care performed: No  ____________________________________________   INITIAL IMPRESSION / ASSESSMENT AND PLAN / ED COURSE  Pertinent labs & imaging results that were available during my care of the patient were reviewed by me and considered in my medical decision making (see chart for details).  Patient presents with increasing generalized weakness for the last 3 weeks. Now she has been seen by the wound care clinic, primary care, and has in home health and physical therapy but continues to have generalized fatigue and weakness. She has no focal abnormality on exam and nothing to suggest acute bacterial infection. She is hemodynamically stable, no evidence of acute neurologic abnormality though she does have a history of previous stroke and as there is no obvious cause I will obtain CT head to rule out new infarction as a possible cause of weakness. No evidence of acute cellulitis, she has appropriate wound care, no cardiopulmonary symptoms. Her urinalysis does not demonstrate acute infection. I will give her light hydration, a CT head is negative I plan to discharge the patient and have discussed return precautions and close follow-up care for which the patient and family are well tied in. They're agreeable with this plan.  ----------------------------------------- 2:41 PM on 12/07/2014 -----------------------------------------  Patient and daughter report no concerns. Patient is rest burning comfortably, no distress. Currently saturating 98% with respiratory rate of 20, no  distress. Clear lungs. Reviewed CT and labs. We'll discharge him, patient's daughter and patient request to be returned by ambulance as they do not have a vehicle which can accommodate wheelchair.  Return precautions and close follow-up advised. ____________________________________________   FINAL CLINICAL IMPRESSION(S) / ED DIAGNOSES  Final diagnoses:  Generalized weakness  Physical deconditioning      Delman Kitten, MD 12/07/14 1444

## 2014-12-07 NOTE — ED Notes (Signed)
Ems from home. Per ems pts daughter says that pt has become increasingly confused last two weeks. Also, +2 edema RLE for 2 weeks. Being treated at wound center for LLE wound.

## 2014-12-11 ENCOUNTER — Encounter: Payer: Medicare Other | Admitting: Surgery

## 2014-12-11 DIAGNOSIS — L97222 Non-pressure chronic ulcer of left calf with fat layer exposed: Secondary | ICD-10-CM | POA: Diagnosis not present

## 2014-12-12 NOTE — Progress Notes (Signed)
Kara, [name] ([id number]) Visit Report for 12/11/2014 Arrival Information Details Patient Name: [name], [name]. Date of Service: 12/11/2014 12:45 PM Medical Record Number: [id number] Patient Account Number: [id number] Date of Birth/Sex: 1922/11/12 (81 y.o. Female) Treating RN: Montey Hora Primary Care Physician: Park Liter Other Clinician: Referring Physician: Park Liter Treating Physician/Extender: Frann Rider in Treatment: 75 Visit Information History Since Last Visit Added or deleted any medications: No Patient Arrived: Wheel Chair Any new allergies or adverse reactions: No Arrival Time: 12:50 Had a fall or experienced change in No Accompanied By: dtr activities of daily [name] that may affect Transfer Assistance: Manual risk of falls: Patient Identification Verified: Yes Signs or symptoms of abuse/neglect since last No Secondary Verification Process Yes visito Completed: Hospitalized since last visit: No Patient Requires Transmission- No Pain Present Now: No Based Precautions: Patient Has Alerts: Yes Patient Alerts: Patient on Blood Thinner Eliquis. R ABI:0.96 Electronic Signature(s) Signed: 12/11/2014 5:44:39 PM By: Montey Hora Entered By: Montey Hora on 12/11/2014 13:00:13 [name] ([id number]) -------------------------------------------------------------------------------- Encounter Discharge Information Details Patient Name: [name] Date of Service: 12/11/2014 12:45 PM Medical Record Number: [id number] Patient Account Number: [id number] Date of Birth/Sex: [birth year] (81 y.o. Female) Treating RN: Montey Hora Primary Care Physician: Park Liter Other Clinician: Referring Physician: Park Liter Treating Physician/Extender: Frann Rider in Treatment: 13 Encounter Discharge Information Items Schedule Follow-up Appointment: No Medication Reconciliation completed and provided to Patient/Care  No Bunnie Rehberg: Provided on Clinical Summary of Care: 12/11/2014 Form Type Recipient Paper Patient LS Electronic Signature(s) Signed: 12/11/2014 1:44:38 PM By: Ruthine Dose Entered By: Ruthine Dose on 12/11/2014 13:44:38 Dubinsky, Dolly Rias ([id number]) -------------------------------------------------------------------------------- Lower Extremity Assessment Details Patient Name: [name]. Date of Service: 12/11/2014 12:45 PM Medical Record Number: [id number] Patient Account Number: [id number] Date of Birth/Sex: 26-Aug-1922 (81 y.o. Female) Treating RN: Montey Hora Primary Care Physician: Park Liter Other Clinician: Referring Physician: Park Liter Treating Physician/Extender: Frann Rider in Treatment: 13 Edema Assessment Assessed: [Left: No] [Right: No] Edema: [Left: Ye] [Right: s] Calf Left: Right: Point of Measurement: 37 cm From Medial Instep 31.5 cm cm Ankle Left: Right: Point of Measurement: 15 cm From Medial Instep 22.2 cm cm Vascular Assessment Pulses: Posterior Tibial Dorsalis Pedis Palpable: [Left:Yes] Extremity colors, hair growth, and conditions: Extremity Color: [Left:Red] Hair Growth on Extremity: [Left:No] Temperature of Extremity: [Left:Warm] Capillary Refill: [Left:< 3 seconds] Toe Nail Assessment Left: Right: Thick: Yes Discolored: Yes Deformed: No Improper Length and Hygiene: Yes Electronic Signature(s) Signed: 12/11/2014 5:44:39 PM By: Montey Hora Entered By: Montey Hora on 12/11/2014 13:05:50 [name] ([id number]) -------------------------------------------------------------------------------- Multi Wound Chart Details Patient Name: [name]. Date of Service: 12/11/2014 12:45 PM Medical Record Number: [id number] Patient Account Number: [id number] Date of Birth/Sex: [birth year] (81 y.o. Female) Treating RN: Montey Hora Primary Care Physician: Park Liter Other Clinician: Referring Physician:  Park Liter Treating Physician/Extender: Frann Rider in Treatment: 13 Vital Signs Height(in): 67 Pulse(bpm): 90 Weight(lbs): 159 Blood Pressure 135/72 (mmHg): Body Mass Index(BMI): 25 Temperature(F): 97.8 Respiratory Rate 18 (breaths/min): Photos: [1:No Photos] [3:No Photos] [4:No Photos] Wound Location: [1:Left, Lateral Lower Leg] [3:Left Foot - Dorsal, Medial] [4:Left Foot - Dorsal, Lateral] Wounding Event: [1:Gradually Appeared] [3:Pressure Injury] [4:Shear/Friction] Primary Etiology: [1:Arterial Insufficiency Ulcer Pressure Ulcer] [4:Pressure Ulcer] Secondary Etiology: [1:Cellulitis] [3:N/A] [4:N/A] Comorbid History: [1:N/A] [3:Cataracts, Arrhythmia, Hypertension, Peripheral Arterial Disease, Peripheral Venous Disease, Osteoarthritis, Confinement Anxiety] [4:Cataracts, Arrhythmia, Hypertension, Peripheral Arterial Disease, Peripheral Venous Disease,  Osteoarthritis, Confinement Anxiety] Date Acquired: [1:08/17/2014] [3:11/12/2014] [4:11/12/2014] Weeks of Treatment: [  1:13] [3:3] [4:3] Wound Status: [1:Open] [3:Open] [4:Open] Measurements L x W x D 0x0x0 [3:0.9x1.5x0.2] [4:0.3x0.4x0.2] (cm) Area (cm) : [1:0] [3:1.06] [4:0.094] Volume (cm) : [1:0] [3:0.212] [4:0.019] % Reduction in Area: [1:100.00%] [3:32.50%] [4:91.50%] % Reduction in Volume: 100.00% [3:-35.00%] [4:82.70%] Classification: [1:Full Thickness Without Exposed Support Structures] [3:Category/Stage III] [4:Category/Stage III] Exudate Amount: [1:N/A] [3:Large] [4:Medium] Exudate Type: [1:N/A] [3:Serous] [4:Serous] Exudate Color: [1:N/A] [3:amber] [4:amber] Wound Margin: [1:N/A] [3:Flat and Intact] [4:Flat and Intact] Granulation Amount: [1:N/A] [3:Large (67-100%)] [4:Small (1-33%)] Granulation Quality: [1:N/A] [3:Pink] [4:Pink] Necrotic Amount: [1:N/A] [3:Small (1-33%)] [4:Large (67-100%)] Necrotic Tissue: N/A Eschar, Adherent Slough Eschar, Adherent Slough Epithelialization: N/A None None Periwound  Skin Texture: No Abnormalities Noted Edema: No Edema: No Excoriation: No Excoriation: No Induration: No Induration: No Callus: No Callus: No Crepitus: No Crepitus: No Fluctuance: No Fluctuance: No Friable: No Friable: No Rash: No Rash: No Scarring: No Scarring: No Periwound Skin No Abnormalities Noted Maceration: No Maceration: No Moisture: Moist: No Moist: No Dry/Scaly: No Dry/Scaly: No Periwound Skin Color: No Abnormalities Noted Atrophie Blanche: No Atrophie Blanche: No Cyanosis: No Cyanosis: No Ecchymosis: No Ecchymosis: No Erythema: No Erythema: No Hemosiderin Staining: No Hemosiderin Staining: No Mottled: No Mottled: No Pallor: No Pallor: No Rubor: No Rubor: No Temperature: N/A N/A No Abnormality Tenderness on No No No Palpation: Wound Preparation: N/A Ulcer Cleansing: Other: Ulcer Cleansing: Other: soap and water soap and water Topical Anesthetic Topical Anesthetic Applied: Other: lidocaine Applied: Other: lidocaine 4% 4% Treatment Notes Electronic Signature(s) Signed: 12/11/2014 5:44:39 PM By: Montey Hora Entered By: Montey Hora on 12/11/2014 13:25:44 [name] ([id number]) -------------------------------------------------------------------------------- [place] Details Patient Name: [name] Date of Service: 12/11/2014 12:45 PM Medical Record Number: [id number] Patient Account Number: [id number] Date of Birth/Sex: 1922-08-16 (81 y.o. Female) Treating RN: Montey Hora Primary Care Physician: Park Liter Other Clinician: Referring Physician: Park Liter Treating Physician/Extender: Frann Rider in Treatment: 42 Active Inactive Abuse / Safety / Falls / Self Care Management Nursing Diagnoses: Potential for falls Goals: Patient will remain injury free Date Initiated: 09/07/2014 Goal Status: Active Interventions: Assess fall risk on admission and as needed Notes: Nutrition Nursing  Diagnoses: Potential for alteratiion in Nutrition/Potential for imbalanced nutrition Goals: Patient/caregiver agrees to and verbalizes understanding of need to use nutritional supplements and/or vitamins as prescribed Date Initiated: 09/07/2014 Goal Status: Active Interventions: Assess patient nutrition upon admission and as needed per policy Notes: Orientation to the Wound Care Program Nursing Diagnoses: Knowledge deficit related to the wound healing center program Goals: Patient/caregiver will verbalize understanding of the [place], [place] ([id number]) Date Initiated: 09/07/2014 Goal Status: Active Interventions: Provide education on orientation to the wound center Notes: Wound/Skin Impairment Nursing Diagnoses: Impaired tissue integrity Goals: Patient/caregiver will verbalize understanding of skin care regimen Date Initiated: 09/07/2014 Goal Status: Active Ulcer/skin breakdown will heal within 14 weeks Date Initiated: 09/07/2014 Goal Status: Active Interventions: Assess patient/caregiver ability to obtain necessary supplies Assess ulceration(s) every visit Notes: Electronic Signature(s) Signed: 12/11/2014 5:44:39 PM By: Montey Hora Entered By: Montey Hora on 12/11/2014 13:25:18 [name] ([id number]) -------------------------------------------------------------------------------- Patient/Caregiver Education Details Patient Name: [name] Date of Service: 12/11/2014 12:45 PM Medical Record Number: [id number] Patient Account Number: [id number] Date of Birth/Gender: 06-10-1922 (81 y.o. Female) Treating RN: Montey Hora Primary Care Physician: Park Liter Other Clinician: Referring Physician: Park Liter Treating Physician/Extender: Frann Rider in Treatment: 13 Education Assessment Education Provided To: Patient and Caregiver Education Topics Provided Venous: Handouts: Other: continue compression  hose  daily Methods: Explain/Verbal Responses: State content correctly Electronic Signature(s) Signed: 12/11/2014 5:44:39 PM By: Montey Hora Entered By: Montey Hora on 12/11/2014 13:26:19 [name] ([id number]) -------------------------------------------------------------------------------- Wound Assessment Details Patient Name: [name]. Date of Service: 12/11/2014 12:45 PM Medical Record Number: [id number] Patient Account Number: [id number] Date of Birth/Sex: 1922-12-20 (81 y.o. Female) Treating RN: Montey Hora Primary Care Physician: Park Liter Other Clinician: Referring Physician: Park Liter Treating Physician/Extender: Frann Rider in Treatment: 13 Wound Status Wound Number: 1 Primary Etiology: Arterial Insufficiency Ulcer Wound Location: Left, Lateral Lower Leg Secondary Etiology: Cellulitis Wounding Event: Gradually Appeared Wound Status: Open Date Acquired: 08/17/2014 Weeks Of Treatment: 13 Clustered Wound: No Photos Photo Uploaded By: Montey Hora on 12/11/2014 16:56:53 Wound Measurements Length: (cm) 0 % Reducti Width: (cm) 0 % Reducti Depth: (cm) 0 Area: (cm) 0 Volume: (cm) 0 on in Area: 100% on in Volume: 100% Wound Description Full Thickness Without Exposed Classification: Support Structures Periwound Skin Texture Texture Color No Abnormalities Noted: No No Abnormalities Noted: No Moisture No Abnormalities Noted: No Treatment Notes Wound #1 (Left, Lateral Lower Leg) Grob, Brandan L. ([id number]) 1. Cleansed with: Cleanse wound with antibacterial soap and water 2. Anesthetic Topical Lidocaine 4% cream to wound bed prior to debridement 4. Dressing Applied: Mepitel 7. Secured with 3 Layer Compression System - Left Lower Extremity Electronic Signature(s) Signed: 12/11/2014 5:44:39 PM By: Montey Hora Entered By: Montey Hora on 12/11/2014 13:13:58 [name]  ([id number]) -------------------------------------------------------------------------------- Wound Assessment Details Patient Name: [name]. Date of Service: 12/11/2014 12:45 PM Medical Record Number: [id number] Patient Account Number: [id number] Date of Birth/Sex: 05-Apr-1922 (81 y.o. Female) Treating RN: Montey Hora Primary Care Physician: Park Liter Other Clinician: Referring Physician: Park Liter Treating Physician/Extender: Frann Rider in Treatment: 13 Wound Status Wound Number: 3 Primary Pressure Ulcer Etiology: Wound Location: Left Foot - Dorsal, Medial Wound Open Wounding Event: Pressure Injury Status: Date Acquired: 11/12/2014 Comorbid Cataracts, Arrhythmia, Hypertension, Weeks Of Treatment: 3 History: Peripheral Arterial Disease, Peripheral Clustered Wound: No Venous Disease, Osteoarthritis, Confinement Anxiety Photos Photo Uploaded By: Montey Hora on 12/11/2014 16:56:53 Wound Measurements Length: (cm) 0.9 Width: (cm) 1.5 Depth: (cm) 0.2 Area: (cm) 1.06 Volume: (cm) 0.212 % Reduction in Area: 32.5% % Reduction in Volume: -35% Epithelialization: None Tunneling: No Undermining: No Wound Description Classification: Category/Stage III Wound Margin: Flat and Intact Exudate Amount: Large Exudate Type: Serous Exudate Color: amber Foul Odor After Cleansing: No Wound Bed Granulation Amount: Large (67-100%) Exposed Structure Granulation Quality: Pink Fascia Exposed: No Necrotic Amount: Small (1-33%) Fat Layer Exposed: No Sanders, Hawley L. ([id number]) Necrotic Quality: Eschar, Adherent Slough Tendon Exposed: No Muscle Exposed: No Joint Exposed: No Bone Exposed: No Limited to Skin Breakdown Periwound Skin Texture Texture Color No Abnormalities Noted: No No Abnormalities Noted: No Callus: No Atrophie Blanche: No Crepitus: No Cyanosis: No Excoriation: No Ecchymosis: No Fluctuance: No Erythema: No Friable:  No Hemosiderin Staining: No Induration: No Mottled: No Localized Edema: No Pallor: No Rash: No Rubor: No Scarring: No Moisture No Abnormalities Noted: No Dry / Scaly: No Maceration: No Moist: No Wound Preparation Ulcer Cleansing: Other: soap and water, Topical Anesthetic Applied: Other: lidocaine 4%, Treatment Notes Wound #3 (Left, Medial, Dorsal Foot) 1. Cleansed with: Cleanse wound with antibacterial soap and water 2. Anesthetic Topical Lidocaine 4% cream to wound bed prior to debridement 4. Dressing Applied: Aquacel Ag 5. Secondary Dressing Applied ABD Pad 7. Secured with 3 Layer Compression System - Left Lower Extremity Electronic Signature(s) Signed: 12/11/2014 5:44:39 PM  By: Montey Hora Entered By: Montey Hora on 12/11/2014 13:24:50 [name] ([id number]) -------------------------------------------------------------------------------- Wound Assessment Details Patient Name: [name]. Date of Service: 12/11/2014 12:45 PM Medical Record Number: [id number] Patient Account Number: [id number] Date of Birth/Sex: June 05, 1922 (81 y.o. Female) Treating RN: Montey Hora Primary Care Physician: Park Liter Other Clinician: Referring Physician: Park Liter Treating Physician/Extender: Frann Rider in Treatment: 13 Wound Status Wound Number: 4 Primary Pressure Ulcer Etiology: Wound Location: Left Foot - Dorsal, Lateral Wound Open Wounding Event: Shear/Friction Status: Date Acquired: 11/12/2014 Comorbid Cataracts, Arrhythmia, Hypertension, Weeks Of Treatment: 3 History: Peripheral Arterial Disease, Peripheral Clustered Wound: No Venous Disease, Osteoarthritis, Confinement Anxiety Photos Photo Uploaded By: Montey Hora on 12/11/2014 16:57:12 Wound Measurements Length: (cm) 0.3 Width: (cm) 0.4 Depth: (cm) 0.2 Area: (cm) 0.094 Volume: (cm) 0.019 % Reduction in Area: 91.5% % Reduction in Volume: 82.7% Epithelialization:  None Tunneling: No Undermining: No Wound Description Classification: Category/Stage III Wound Margin: Flat and Intact Exudate Amount: Medium Exudate Type: Serous Exudate Color: amber Foul Odor After Cleansing: No Wound Bed Granulation Amount: Small (1-33%) Exposed Structure Granulation Quality: Pink Fascia Exposed: No Necrotic Amount: Large (67-100%) Fat Layer Exposed: No Catala, Merrick L. ([id number]) Necrotic Quality: Eschar, Adherent Slough Tendon Exposed: No Muscle Exposed: No Joint Exposed: No Bone Exposed: No Limited to Skin Breakdown Periwound Skin Texture Texture Color No Abnormalities Noted: No No Abnormalities Noted: No Callus: No Atrophie Blanche: No Crepitus: No Cyanosis: No Excoriation: No Ecchymosis: No Fluctuance: No Erythema: No Friable: No Hemosiderin Staining: No Induration: No Mottled: No Localized Edema: No Pallor: No Rash: No Rubor: No Scarring: No Temperature / Pain Moisture Temperature: No Abnormality No Abnormalities Noted: No Dry / Scaly: No Maceration: No Moist: No Wound Preparation Ulcer Cleansing: Other: soap and water, Topical Anesthetic Applied: Other: lidocaine 4%, Treatment Notes Wound #4 (Left, Lateral, Dorsal Foot) 1. Cleansed with: Cleanse wound with antibacterial soap and water 2. Anesthetic Topical Lidocaine 4% cream to wound bed prior to debridement 4. Dressing Applied: Aquacel Ag 5. Secondary Dressing Applied ABD Pad 7. Secured with 3 Layer Compression System - Left Lower Extremity Electronic Signature(s) Signed: 12/11/2014 5:44:39 PM By: Montey Hora Entered By: Montey Hora on 12/11/2014 13:25:05 [name] ([id number]) -------------------------------------------------------------------------------- Vitals Details Patient Name: [name] Date of Service: 12/11/2014 12:45 PM Medical Record Number: [id number] Patient Account Number: [id number] Date of Birth/Sex: Nov 24, 1922 (81 y.o.  Female) Treating RN: Montey Hora Primary Care Physician: Park Liter Other Clinician: Referring Physician: Park Liter Treating Physician/Extender: Frann Rider in Treatment: 13 Vital Signs Time Taken: 13:00 Temperature (F): 97.8 Height (in): 67 Pulse (bpm): 90 Weight (lbs): 159 Respiratory Rate (breaths/min): 18 Body Mass Index (BMI): 24.9 Blood Pressure (mmHg): 135/72 Reference Range: 80 - 120 mg / dl Electronic Signature(s) Signed: 12/11/2014 5:44:39 PM By: Montey Hora Entered By: Montey Hora on 12/11/2014 13:01:05

## 2014-12-12 NOTE — Progress Notes (Signed)
[name], [name] ([id number]) Visit Report for 12/11/2014 Chief Complaint Document Details Patient Name: [name], [name] 12/11/2014 12:45 Date of Service: PM Medical Record [id number] Number: Patient Account Number: [id number] 05-23-1922 (81 y.o. Treating RN: [name] Date of Birth/Sex: Female) Other Clinician: Primary Care Physician: [name] Treating [name] Referring Physician: Park [name] Physician/Extender: [name] in Treatment: 13 Information Obtained from: Patient Chief Complaint Patient presents to the wound care center today with an open arterial ulcer. The patient has had left lower extremity ulceration for about 1 month. Electronic Signature(s) Signed: 12/11/2014 1:31:02 PM By: Kara Fudge MD, FACS Entered By: [name] on 12/11/2014 13:31:02 [name] ([id number]) -------------------------------------------------------------------------------- HPI Details Patient Name: [name], [name] 12/11/2014 12:45 Date of Service: PM Medical Record [id number] Number: Patient Account Number: [id number] 1922/03/14 (81 y.o. Treating RN: [name] Date of Birth/Sex: Female) Other Clinician: Primary Care Physician: [name] Treating [name] Referring Physician: Park [name] Physician/Extender: [name] in Treatment: 13 History of Present Illness Location: left lower extremity ulceration Quality: Patient reports experiencing burning to affected area(s). Severity: Patient states wound are getting worse. Duration: Patient has had the wound for < 4 weeks prior to presenting for treatment Timing: Pain in wound is constant (hurts all the time) Context: The wound appeared gradually over time Modifying Factors: Other treatment(s) tried include:has been on oral anti-buttocks since the beginning of the month and had 10 days of Augmentin and now is on doxycycline. Associated Signs and Symptoms: Patient reports having difficulty standing  for long periods. HPI Description: 81 year old patient was admitted to [name] between 08/13/2014 and 08/17/2014 with cellulitis of the left lower extremity.duplex study done then showed no DVT and the patient was on IV vancomycin and Rocephin and also received Unasyn later. The patient was sent home on 10 days of Augmentin. Her other comorbidities of atrial fibrillation, essential hypertension, diabetes mellitus type 2, coronary artery disease were managed with appropriate medications. on 09/04/2014 she was seen in the ER and was found to have a 10 cm ulceration on the left lower extremity lateral and superior to the lateral malleolus. The granulation tissue was noted and she was referred to the wound center. the ER notes that her ABIs were within normal limits and she was sent home on consultative management. a duplex scan of the lower extremities was done by the nursing home and this showed that the right ABI was 0.96 with a multiphasic waveform throughout the right leg. There was a monophasic waveform of the left leg suggesting left external iliac artery stenosis with blunting distally and the ABI could not be calculated on the left side. Moderate to severe ischemia was suspected of the left leg and a MR angiogram was recommended. 09/17/2014 -- Xray Left tibia and fibula --- unremarkable tibia and fibula Her wound culture is back and this has grown a heavy growth of staph aureus sensitive to ciprofloxacin and clindamycin and Bactrim. She has been on doxycycline since July 14 for 10 days and we will review her clinically before switching her antibiotics if need be. After reviewing her chart she is not going to be here till 09/17/2014 and hence I will call in Bactrim DS 1 twice a day for 14 days. She has seen [name] for an opinion and she is scheduled a procedure on this coming Thursday. other than that the pain and swelling continues and she has no other acute  problems. 09/25/2014 -- she had a recent procedure done by [name]  for diffuse SFA stenosis, popliteal occlusion and tibial disease. she had at angioplasty of the left peroneal artery and tibioperoneal trunk, left superficial Salvatierra, Kara L. ([id number]) femoral artery, left popliteal artery, distal SFA and popliteal artery. the patient is experiencing some swelling of the left lower extremity after the procedure. 10/09/2014 -- she has an appointment to see her vascular surgeon again this weekend for possibly a vascular Doppler. No other issues. 10/23/2014 -- The patient was recently seen by [name] on this past Friday. We do not have his reports but we do know that he was pleased with her progress and did not plan any further procedure in the near future. 10/30/2014 - we have received [name] evaluation and he has mentioned that the duplex done showed a stenosis versus a lesion in the distal popliteal artery and tibioperoneal trunk in an area below her previous intervention and the overall flow was significantly better and the stent itself was patent. Her perfusion has improved but it was not optimized. Since her wound was improving he did not recommend any angiogram at this point. However if her wound worsens or infection develops he would recommend an angiogram and further evaluation to improve her perfusion. He is going to see her back in 6-8 weeks. 11/20/2014 -- she was not here last week because of her illness and is here to see as after 2 weeks. Her daughter has checked with her insurance company and she will have no out-of-pocket expense for her skin substitute. 12/04/2014 -- she has her skin substitute available but due to the nature of her wounds she will not use it today. Electronic Signature(s) Signed: 12/11/2014 1:31:13 PM By: Kara Fudge MD, FACS Entered By: [name] on 12/11/2014 13:31:12 [name]  ([id number]) -------------------------------------------------------------------------------- Physical Exam Details Patient Name: [name], [name] 12/11/2014 12:45 Date of Service: PM Medical Record [id number] Number: Patient Account Number: [id number] 10-Jul-1922 (81 y.o. Treating RN: [name] Date of Birth/Sex: Female) Other Clinician: Primary Care Physician: [name] Treating [name] Referring Physician: Park [name] Physician/Extender: Weeks in Treatment: 13 Constitutional . Pulse regular. Respirations normal and unlabored. Afebrile. . Eyes Nonicteric. Reactive to light. Ears, Nose, Mouth, and Throat Lips, teeth, and gums WNL.Marland Kitchen Moist mucosa without lesions . Neck supple and nontender. No palpable supraclavicular or cervical adenopathy. Normal sized without goiter. Respiratory WNL. No retractions.. Cardiovascular Pedal Pulses WNL. No clubbing, cyanosis or edema. Lymphatic No adneopathy. No adenopathy. No adenopathy. Musculoskeletal Adexa without tenderness or enlargement.. Digits and nails w/o clubbing, cyanosis, infection, petechiae, ischemia, or inflammatory conditions.. Integumentary (Hair, Skin) No suspicious lesions. No crepitus or fluctuance. No peri-wound warmth or erythema. No masses.Marland Kitchen Psychiatric Judgement and insight Intact.. No evidence of depression, anxiety, or agitation.. Notes the wound on the lower extremity has almost completely healed. The wound on the dorsum of the foot has minimal slough and we will continue local care. Electronic Signature(s) Signed: 12/11/2014 1:31:54 PM By: Kara Fudge MD, FACS Entered By: [name] on 12/11/2014 13:31:54 [name] ([id number]) -------------------------------------------------------------------------------- Physician Orders Details Patient Name: [name], [name] 12/11/2014 12:45 Date of Service: PM Medical Record [id number] Number: Patient Account Number: [id number] Sep 20, 1922  (81 y.o. Treating RN: [name] Date of Birth/Sex: Female) Other Clinician: Primary Care Physician: [name] Treating Kieffer Blatz Referring Physician: Park [name] Physician/Extender: [name] in Treatment: 59 Verbal / Phone Orders: Yes Clinician: Montey [name] Read Back and Verified: Yes Diagnosis Coding Wound Cleansing Wound #1 Left,Lateral Lower Leg o May Shower, gently pat wound dry  prior to applying new dressing. o May shower with protection. o No tub bath. Wound #3 Left,Medial,Dorsal Foot o May Shower, gently pat wound dry prior to applying new dressing. o May shower with protection. o No tub bath. Wound #4 Left,Lateral,Dorsal Foot o May Shower, gently pat wound dry prior to applying new dressing. o May shower with protection. o No tub bath. Anesthetic Wound #1 Left,Lateral Lower Leg o Topical Lidocaine 4% cream applied to wound bed prior to debridement Wound #3 Left,Medial,Dorsal Foot o Topical Lidocaine 4% cream applied to wound bed prior to debridement Wound #4 Left,Lateral,Dorsal Foot o Topical Lidocaine 4% cream applied to wound bed prior to debridement Primary Wound Dressing Wound #1 Left,Lateral Lower Leg o Mepitel One Wound #3 Left,Medial,Dorsal Foot o Aquacel Ag Wound #4 Left,Lateral,Dorsal Foot o Aquacel Ag [name], [name]. ([id number]) Secondary Dressing Wound #3 Left,Medial,Dorsal Foot o ABD pad Wound #4 Left,Lateral,Dorsal Foot o ABD pad Dressing Change Frequency Wound #1 Left,Lateral Lower Leg o Change dressing every week Wound #3 Left,Medial,Dorsal Foot o Change dressing every week Wound #4 Left,Lateral,Dorsal Foot o Change dressing every week Edema Control Wound #1 Left,Lateral Lower Leg o 3 Layer Compression System - Left Lower Extremity - Profore lite used. o Elevate legs to the level of the heart and pump ankles as often as possible o Other: - Monitor toes for  discoloration;blue/purple Wound #3 Left,Medial,Dorsal Foot o 3 Layer Compression System - Left Lower Extremity - Profore lite used. o Elevate legs to the level of the heart and pump ankles as often as possible o Other: - Monitor toes for discoloration;blue/purple Wound #4 Left,Lateral,Dorsal Foot o 3 Layer Compression System - Left Lower Extremity - Profore lite used. o Elevate legs to the level of the heart and pump ankles as often as possible o Other: - Monitor toes for discoloration;blue/purple Electronic Signature(s) Signed: 12/11/2014 1:38:48 PM By: Kara Fudge MD, FACS Signed: 12/11/2014 5:44:39 PM By: [name] Entered By: [name] on 12/11/2014 13:27:34 [name] ([id number]) -------------------------------------------------------------------------------- Problem List Details Patient Name: [name], [name] 12/11/2014 12:45 Date of Service: PM Medical Record [id number] Number: Patient Account Number: [id number] 02-19-1922 (81 y.o. Treating RN: [name] Date of Birth/Sex: Female) Other Clinician: Primary Care Physician: [name] Treating Kyia Rhude Referring Physician: Park [name] Physician/Extender: [name] in Treatment: 13 Active Problems ICD-10 Encounter Code Description Active Date Diagnosis I70.248 Atherosclerosis of native arteries of left leg with ulceration 09/07/2014 Yes of other part of lower left leg L97.222 Non-pressure chronic ulcer of left calf with fat layer 09/07/2014 Yes exposed L03.116 Cellulitis of left lower limb 09/07/2014 Yes Inactive Problems Resolved Problems Electronic Signature(s) Signed: 12/11/2014 1:30:53 PM By: Kara Fudge MD, FACS Entered By: [name] on 12/11/2014 13:30:52 [name] ([id number]) -------------------------------------------------------------------------------- Progress Note Details Patient Name: [name] 12/11/2014 12:45 Date of Service: PM Medical  Record [id number] Number: Patient Account Number: [id number] December 29, 1922 (81 y.o. Treating RN: [name] Date of Birth/Sex: Female) Other Clinician: Primary Care Physician: [name] Treating Dotty Gonzalo Referring Physician: Park [name] Physician/Extender: [name] in Treatment: 13 Subjective Chief Complaint Information obtained from Patient Patient presents to the wound care center today with an open arterial ulcer. The patient has had left lower extremity ulceration for about 1 month. History of Present Illness (HPI) The following HPI elements were documented for the patient's wound: Location: left lower extremity ulceration Quality: Patient reports experiencing burning to affected area(s). Severity: Patient states wound are getting worse. Duration: Patient has had the wound for <  4 weeks prior to presenting for treatment Timing: Pain in wound is constant (hurts all the time) Context: The wound appeared gradually over time Modifying Factors: Other treatment(s) tried include:has been on oral anti-buttocks since the beginning of the month and had 10 days of Augmentin and now is on doxycycline. Associated Signs and Symptoms: Patient reports having difficulty standing for long periods. 80 year old patient was admitted to [facility] between 08/13/2014 and 08/17/2014 with cellulitis of the left lower extremity.duplex study done then showed no DVT and the patient was on IV vancomycin and Rocephin and also received Unasyn later. The patient was sent home on 10 days of Augmentin. Her other comorbidities of atrial fibrillation, essential hypertension, diabetes mellitus type 2, coronary artery disease were managed with appropriate medications. on 09/04/2014 she was seen in the ER and was found to have a 10 cm ulceration on the left lower extremity lateral and superior to the lateral malleolus. The granulation tissue was noted and she was referred to the wound  center. the ER notes that her ABIs were within normal limits and she was sent home on consultative management. a duplex scan of the lower extremities was done by the nursing home and this showed that the right ABI was 0.96 with a multiphasic waveform throughout the right leg. There was a monophasic waveform of the left leg suggesting left external iliac artery stenosis with blunting distally and the ABI could not be calculated on the left side. Moderate to severe ischemia was suspected of the left leg and a MR angiogram was recommended. 09/17/2014 -- Xray Left tibia and fibula --- unremarkable tibia and fibula Her wound culture is back and this has grown a heavy growth of staph aureus sensitive to ciprofloxacin and clindamycin and Bactrim. [name], [name] ([id number]) She has been on doxycycline since July 14 for 10 days and we will review her clinically before switching her antibiotics if need be. After reviewing her chart she is not going to be here till 09/17/2014 and hence I will call in Bactrim DS 1 twice a day for 14 days. She has seen [name] for an opinion and she is scheduled a procedure on this coming Thursday. other than that the pain and swelling continues and she has no other acute problems. 09/25/2014 -- she had a recent procedure done by [name] for diffuse SFA stenosis, popliteal occlusion and tibial disease. she had at angioplasty of the left peroneal artery and tibioperoneal trunk, left superficial femoral artery, left popliteal artery, distal SFA and popliteal artery. the patient is experiencing some swelling of the left lower extremity after the procedure. 10/09/2014 -- she has an appointment to see her vascular surgeon again this weekend for possibly a vascular Doppler. No other issues. 10/23/2014 -- The patient was recently seen by [name] on this past Friday. We do not have his reports but we do know that he was pleased with her progress and did not plan any further  procedure in the near future. 10/30/2014 - we have received [name] evaluation and he has mentioned that the duplex done showed a stenosis versus a lesion in the distal popliteal artery and tibioperoneal trunk in an area below her previous intervention and the overall flow was significantly better and the stent itself was patent. Her perfusion has improved but it was not optimized. Since her wound was improving he did not recommend any angiogram at this point. However if her wound worsens or infection develops he would recommend an  angiogram and further evaluation to improve her perfusion. He is going to see her back in 6-8 weeks. 11/20/2014 -- she was not here last week because of her illness and is here to see as after 2 weeks. Her daughter has checked with her insurance company and she will have no out-of-pocket expense for her skin substitute. 12/04/2014 -- she has her skin substitute available but due to the nature of her wounds she will not use it today. Objective Constitutional Pulse regular. Respirations normal and unlabored. Afebrile. Vitals Time Taken: 1:00 PM, Height: 67 in, Weight: 159 lbs, BMI: 24.9, Temperature: 97.8 F, Pulse: 90 bpm, Respiratory Rate: 18 breaths/min, Blood Pressure: 135/72 mmHg. Eyes Nonicteric. Reactive to light. Ears, Nose, Mouth, and Throat Lips, teeth, and gums WNL.Marland Kitchen Moist mucosa without lesions . Neck [name], Kara L. ([id number]) supple and nontender. No palpable supraclavicular or cervical adenopathy. Normal sized without goiter. Respiratory WNL. No retractions.. Cardiovascular Pedal Pulses WNL. No clubbing, cyanosis or edema. Lymphatic No adneopathy. No adenopathy. No adenopathy. Musculoskeletal Adexa without tenderness or enlargement.. Digits and nails w/o clubbing, cyanosis, infection, petechiae, ischemia, or inflammatory conditions.Marland Kitchen Psychiatric Judgement and insight Intact.. No evidence of depression, anxiety, or agitation.. General  Notes: the wound on the lower extremity has almost completely healed. The wound on the dorsum of the foot has minimal slough and we will continue local care. Integumentary (Hair, Skin) No suspicious lesions. No crepitus or fluctuance. No peri-wound warmth or erythema. No masses.. Wound #1 status is Open. Original cause of wound was Gradually Appeared. The wound is located on the Left,Lateral Lower Leg. The wound measures 0cm length x 0cm width x 0cm depth; 0cm^2 area and 0cm^3 volume. Wound #3 status is Open. Original cause of wound was Pressure Injury. The wound is located on the Left,Medial,Dorsal Foot. The wound measures 0.9cm length x 1.5cm width x 0.2cm depth; 1.06cm^2 area and 0.212cm^3 volume. The wound is limited to skin breakdown. There is no tunneling or undermining noted. There is a large amount of serous drainage noted. The wound margin is flat and intact. There is large (67-100%) pink granulation within the wound bed. There is a small (1-33%) amount of necrotic tissue within the wound bed including Eschar and Adherent Slough. The periwound skin appearance did not exhibit: Callus, Crepitus, Excoriation, Fluctuance, Friable, Induration, Localized Edema, Rash, Scarring, Dry/Scaly, Maceration, Moist, Atrophie Blanche, Cyanosis, Ecchymosis, Hemosiderin Staining, Mottled, Pallor, Rubor, Erythema. Wound #4 status is Open. Original cause of wound was Shear/Friction. The wound is located on the Left,Lateral,Dorsal Foot. The wound measures 0.3cm length x 0.4cm width x 0.2cm depth; 0.094cm^2 area and 0.019cm^3 volume. The wound is limited to skin breakdown. There is no tunneling or undermining noted. There is a medium amount of serous drainage noted. The wound margin is flat and intact. There is small (1-33%) pink granulation within the wound bed. There is a large (67-100%) amount of necrotic tissue within the wound bed including Eschar and Adherent Slough. The periwound skin appearance did  not exhibit: Callus, Crepitus, Excoriation, Fluctuance, Friable, Induration, Localized Edema, Rash, Scarring, Dry/Scaly, Maceration, Moist, Atrophie Blanche, Cyanosis, Ecchymosis, Hemosiderin Staining, Mottled, Pallor, Rubor, Erythema. Periwound temperature was noted as No Abnormality. [name], [name] ([id number]) Assessment Active Problems ICD-10 I70.248 - Atherosclerosis of native arteries of left leg with ulceration of other part of lower left leg L97.222 - Non-pressure chronic ulcer of left calf with fat layer exposed L03.116 - Cellulitis of left lower limb Plan Wound Cleansing: Wound #1 Left,Lateral Lower Leg: May Shower, gently pat  wound dry prior to applying new dressing. May shower with protection. No tub bath. Wound #3 Left,Medial,Dorsal Foot: May Shower, gently pat wound dry prior to applying new dressing. May shower with protection. No tub bath. Wound #4 Left,Lateral,Dorsal Foot: May Shower, gently pat wound dry prior to applying new dressing. May shower with protection. No tub bath. Anesthetic: Wound #1 Left,Lateral Lower Leg: Topical Lidocaine 4% cream applied to wound bed prior to debridement Wound #3 Left,Medial,Dorsal Foot: Topical Lidocaine 4% cream applied to wound bed prior to debridement Wound #4 Left,Lateral,Dorsal Foot: Topical Lidocaine 4% cream applied to wound bed prior to debridement Primary Wound Dressing: Wound #1 Left,Lateral Lower Leg: Mepitel One Wound #3 Left,Medial,Dorsal Foot: Aquacel Ag Wound #4 Left,Lateral,Dorsal Foot: Aquacel Ag Secondary Dressing: Wound #3 Left,Medial,Dorsal Foot: ABD pad Wound #4 Left,Lateral,Dorsal Foot: ABD pad [name], Kara L. ([id number]) Dressing Change Frequency: Wound #1 Left,Lateral Lower Leg: Change dressing every week Wound #3 Left,Medial,Dorsal Foot: Change dressing every week Wound #4 Left,Lateral,Dorsal Foot: Change dressing every week Edema Control: Wound #1 Left,Lateral Lower Leg: 3 Layer  Compression System - Left Lower Extremity - Profore lite used. Elevate legs to the level of the heart and pump ankles as often as possible Other: - Monitor toes for discoloration;blue/purple Wound #3 Left,Medial,Dorsal Foot: 3 Layer Compression System - Left Lower Extremity - Profore lite used. Elevate legs to the level of the heart and pump ankles as often as possible Other: - Monitor toes for discoloration;blue/purple Wound #4 Left,Lateral,Dorsal Foot: 3 Layer Compression System - Left Lower Extremity - Profore lite used. Elevate legs to the level of the heart and pump ankles as often as possible Other: - Monitor toes for discoloration;blue/purple We will use Mepitel on the superior wound and silver alginate on the wound on the dorsum of the foot. She will use a Profore light compression. She will come back and see as next week Electronic Signature(s) Signed: 12/11/2014 1:33:47 PM By: Kara Fudge MD, FACS Entered By: [name] on 12/11/2014 13:33:47 [name] ([id number]) -------------------------------------------------------------------------------- SuperBill Details Patient Name: [name] Date of Service: 12/11/2014 Medical Record Number: [id number] Patient Account Number: [id number] Date of Birth/Sex: August 26, 1922 (81 y.o. Female) Treating RN: [name] Primary Care Physician: [name] Other Clinician: Referring Physician: Park [name] Treating Physician/Extender: Frann Rider in Treatment: 13 Diagnosis Coding ICD-10 Codes Code Description I70.248 Atherosclerosis of native arteries of left leg with ulceration of other part of lower left leg L97.222 Non-pressure chronic ulcer of left calf with fat layer exposed L03.116 Cellulitis of left lower limb Facility Procedures CPT4: Description Modifier Quantity Code [id number] (Facility Use Only) [id number] - [facility] COMPRS LWR LT 1 LEG Physician Procedures CPT4: Description Modifier Quantity  Code [id number] 60156 - WC PHYS LEVEL 3 - EST PT 1 ICD-10 Description Diagnosis I70.248 Atherosclerosis of native arteries of left leg with ulceration of other part of lower left leg L97.222 Non-pressure chronic ulcer of  left calf with fat layer exposed L03.116 Cellulitis of left lower limb Electronic Signature(s) Signed: 12/11/2014 1:34:08 PM By: Kara Fudge MD, FACS Entered By: [name] on 12/11/2014 13:34:08

## 2014-12-17 ENCOUNTER — Telehealth: Payer: Self-pay | Admitting: Family Medicine

## 2014-12-17 MED ORDER — METOPROLOL TARTRATE 25 MG PO TABS: 37.5000 mg | ORAL_TABLET | Freq: Two times a day (BID) | ORAL | Status: DC

## 2014-12-17 MED ORDER — APIXABAN 2.5 MG PO TABS: 2.5000 mg | ORAL_TABLET | Freq: Two times a day (BID) | ORAL | Status: AC

## 2014-12-17 NOTE — Telephone Encounter (Signed)
Just refilled her klor-con and furosemide (micro-K ordered for the small pills). Not due. Other 2 sent to her pharmacy.

## 2014-12-17 NOTE — Telephone Encounter (Signed)
Patient daughter called stating that her mother needs refill on her Eliquis, metoprolol, furosemide and klor-con white pills due to can not swallow big pills.

## 2014-12-17 NOTE — Telephone Encounter (Signed)
Forward to provider

## 2014-12-17 NOTE — Telephone Encounter (Signed)
Patient's daughter notified.

## 2014-12-20 ENCOUNTER — Encounter: Payer: Medicare Other | Attending: Surgery | Admitting: Surgery

## 2014-12-20 DIAGNOSIS — L97222 Non-pressure chronic ulcer of left calf with fat layer exposed: Secondary | ICD-10-CM | POA: Diagnosis not present

## 2014-12-20 DIAGNOSIS — I70248 Atherosclerosis of native arteries of left leg with ulceration of other part of lower left leg: Secondary | ICD-10-CM | POA: Diagnosis present

## 2014-12-20 DIAGNOSIS — I4891 Unspecified atrial fibrillation: Secondary | ICD-10-CM | POA: Diagnosis not present

## 2014-12-20 DIAGNOSIS — L03116 Cellulitis of left lower limb: Secondary | ICD-10-CM | POA: Insufficient documentation

## 2014-12-20 DIAGNOSIS — I10 Essential (primary) hypertension: Secondary | ICD-10-CM | POA: Diagnosis not present

## 2014-12-20 DIAGNOSIS — I2510 Atherosclerotic heart disease of native coronary artery without angina pectoris: Secondary | ICD-10-CM | POA: Insufficient documentation

## 2014-12-20 DIAGNOSIS — E119 Type 2 diabetes mellitus without complications: Secondary | ICD-10-CM | POA: Insufficient documentation

## 2014-12-21 NOTE — Progress Notes (Signed)
EDLA, PARA ([id number]) Visit Report for 12/20/2014 Arrival Information Details Patient Name: [name], [name]. Date of Service: 12/20/2014 9:30 AM Medical Record Number: [id number] Patient Account Number: [id number] Date of Birth/Sex: Sep 04, 1922 (81 y.o. Female) Treating RN: Montey Hora Primary Care Physician: Park Liter Other Clinician: Referring Physician: Park Liter Treating Physician/Extender: Frann Rider in Treatment: 14 Visit Information History Since Last Visit Added or deleted any medications: No Patient Arrived: Wheel Chair Any new allergies or adverse reactions: No Arrival Time: 10:08 Had a fall or experienced change in No Accompanied By: dtr activities of daily [name] that may affect Transfer Assistance: Manual risk of falls: Patient Identification Verified: Yes Signs or symptoms of abuse/neglect since last No Secondary Verification Process Yes visito Completed: Hospitalized since last visit: No Patient Requires Transmission- No Pain Present Now: No Based Precautions: Patient Has Alerts: Yes Patient Alerts: Patient on Blood Thinner Eliquis. R ABI:0.96 Electronic Signature(s) Signed: 12/20/2014 5:32:28 PM By: Montey Hora Entered By: Montey Hora on 12/20/2014 10:10:48 [name] ([id number]) -------------------------------------------------------------------------------- Encounter Discharge Information Details Patient Name: [name]. Date of Service: 12/20/2014 9:30 AM Medical Record Number: [id number] Patient Account Number: [id number] Date of Birth/Sex: 1922-10-28 (81 y.o. Female) Treating RN: Montey Hora Primary Care Physician: Park Liter Other Clinician: Referring Physician: Park Liter Treating Physician/Extender: Frann Rider in Treatment: 49 Encounter Discharge Information Items Discharge Pain Level: 0 Discharge Condition: Stable Ambulatory Status: Wheelchair Discharge Destination:  Home Transportation: Private Auto Accompanied By: dtr Schedule Follow-up Appointment: Yes Medication Reconciliation completed and provided to Patient/Care No Midge Momon: Provided on Clinical Summary of Care: 12/20/2014 Form Type Recipient Paper Patient LS Electronic Signature(s) Signed: 12/20/2014 10:53:23 AM By: Ruthine Dose Entered By: Ruthine Dose on 12/20/2014 10:53:23 [name] ([id number]) -------------------------------------------------------------------------------- Lower Extremity Assessment Details Patient Name: [name] Date of Service: 12/20/2014 9:30 AM Medical Record Number: [id number] Patient Account Number: [id number] Date of Birth/Sex: [birth year] (81 y.o. Female) Treating RN: Montey Hora Primary Care Physician: Park Liter Other Clinician: Referring Physician: Park Liter Treating Physician/Extender: Frann Rider in Treatment: 14 Edema Assessment Assessed: [Left: No] [Right: No] Edema: [Left: Ye] [Right: s] Calf Left: Right: Point of Measurement: 37 cm From Medial Instep 31.5 cm cm Ankle Left: Right: Point of Measurement: 15 cm From Medial Instep 20.2 cm cm Vascular Assessment Pulses: Posterior Tibial Dorsalis Pedis Palpable: [Left:Yes] Extremity colors, hair growth, and conditions: Extremity Color: [Left:Hyperpigmented] Hair Growth on Extremity: [Left:Yes] Temperature of Extremity: [Left:Warm] Capillary Refill: [Left:< 3 seconds] Toe Nail Assessment Left: Right: Thick: Yes Discolored: Yes Deformed: No Improper Length and Hygiene: Yes Electronic Signature(s) Signed: 12/20/2014 5:32:28 PM By: Montey Hora Entered By: Montey Hora on 12/20/2014 10:16:46 [name] ([id number]) -------------------------------------------------------------------------------- Multi Wound Chart Details Patient Name: [name]. Date of Service: 12/20/2014 9:30 AM Medical Record Number: [id number] Patient Account Number:  [id number] Date of Birth/Sex: 10-27-1922 (81 y.o. Female) Treating RN: Montey Hora Primary Care Physician: Park Liter Other Clinician: Referring Physician: Park Liter Treating Physician/Extender: Frann Rider in Treatment: 14 Vital Signs Height(in): 67 Pulse(bpm): 65 Weight(lbs): 159 Blood Pressure 131/54 (mmHg): Body Mass Index(BMI): 25 Temperature(F): 97.4 Respiratory Rate 16 (breaths/min): Photos: [1:No Photos] [3:No Photos] [4:No Photos] Wound Location: [1:Left, Lateral Lower Leg] [3:Left Foot - Dorsal, Medial] [4:Left Foot - Dorsal, Lateral] Wounding Event: [1:Gradually Appeared] [3:Pressure Injury] [4:Shear/Friction] Primary Etiology: [1:Arterial Insufficiency Ulcer Pressure Ulcer] [4:Pressure Ulcer] Secondary Etiology: [1:Cellulitis] [3:N/A] [4:N/A] Comorbid History: [1:N/A] [3:Cataracts, Arrhythmia, Hypertension, Peripheral Arterial Disease, Peripheral Venous Disease, Osteoarthritis, Confinement Anxiety] [4:Cataracts, Arrhythmia,  Hypertension, Peripheral Arterial Disease, Peripheral Venous Disease,  Osteoarthritis, Confinement Anxiety] Date Acquired: [1:08/17/2014] [3:11/12/2014] [4:11/12/2014] Weeks of Treatment: [1:14] [3:4] [4:4] Wound Status: [1:Healed - Epithelialized] [3:Open] [4:Open] Measurements L x W x D 0x0x0 [3:0.4x0.8x0.2] [4:0.2x0.3x0.2] (cm) Area (cm) : [1:0] [3:0.251] [4:0.047] Volume (cm) : [1:0] [3:0.05] [4:0.009] % Reduction in Area: [1:100.00%] [3:84.00%] [4:95.70%] % Reduction in Volume: 100.00% [3:68.20%] [4:91.80%] Classification: [1:Full Thickness Without Exposed Support Structures] [3:Category/Stage III] [4:Category/Stage III] Exudate Amount: [1:N/A] [3:Large] [4:Medium] Exudate Type: [1:N/A] [3:Serous] [4:Serous] Exudate Color: [1:N/A] [3:amber] [4:amber] Wound Margin: [1:N/A] [3:Flat and Intact] [4:Flat and Intact] Granulation Amount: [1:N/A] [3:Medium (34-66%)] [4:Medium (34-66%)] Granulation Quality: [1:N/A] [3:Pink]  [4:Pink] Necrotic Amount: [1:N/A] [3:Medium (34-66%)] [4:Medium (34-66%)] Necrotic Tissue: N/A Eschar, Adherent Slough Eschar, Adherent Slough Epithelialization: N/A None None Periwound Skin Texture: No Abnormalities Noted Edema: No Edema: No Excoriation: No Excoriation: No Induration: No Induration: No Callus: No Callus: No Crepitus: No Crepitus: No Fluctuance: No Fluctuance: No Friable: No Friable: No Rash: No Rash: No Scarring: No Scarring: No Periwound Skin No Abnormalities Noted Maceration: No Maceration: No Moisture: Moist: No Moist: No Dry/Scaly: No Dry/Scaly: No Periwound Skin Color: No Abnormalities Noted Atrophie Blanche: No Atrophie Blanche: No Cyanosis: No Cyanosis: No Ecchymosis: No Ecchymosis: No Erythema: No Erythema: No Hemosiderin Staining: No Hemosiderin Staining: No Mottled: No Mottled: No Pallor: No Pallor: No Rubor: No Rubor: No Temperature: N/A N/A No Abnormality Tenderness on No No No Palpation: Wound Preparation: N/A Ulcer Cleansing: Other: Ulcer Cleansing: Other: soap and water soap and water Topical Anesthetic Topical Anesthetic Applied: Other: lidocaine Applied: Other: lidocaine 4% 4% Treatment Notes Electronic Signature(s) Signed: 12/20/2014 5:32:28 PM By: Montey Hora Entered By: Montey Hora on 12/20/2014 10:29:30 [name] ([id number]) -------------------------------------------------------------------------------- [place] Details Patient Name: [name] Date of Service: 12/20/2014 9:30 AM Medical Record Number: [id number] Patient Account Number: [id number] Date of Birth/Sex: [birth year] (81 y.o. Female) Treating RN: Montey Hora Primary Care Physician: Park Liter Other Clinician: Referring Physician: Park Liter Treating Physician/Extender: Frann Rider in Treatment: 101 Active Inactive Abuse / Safety / Falls / Self Care Management Nursing Diagnoses: Potential  for falls Goals: Patient will remain injury free Date Initiated: 09/07/2014 Goal Status: Active Interventions: Assess fall risk on admission and as needed Notes: Nutrition Nursing Diagnoses: Potential for alteratiion in Nutrition/Potential for imbalanced nutrition Goals: Patient/caregiver agrees to and verbalizes understanding of need to use nutritional supplements and/or vitamins as prescribed Date Initiated: 09/07/2014 Goal Status: Active Interventions: Assess patient nutrition upon admission and as needed per policy Notes: Orientation to the Wound Care Program Nursing Diagnoses: Knowledge deficit related to the wound healing center program Goals: Patient/caregiver will verbalize understanding of the [place], [place] ([id number]) Date Initiated: 09/07/2014 Goal Status: Active Interventions: Provide education on orientation to the wound center Notes: Wound/Skin Impairment Nursing Diagnoses: Impaired tissue integrity Goals: Patient/caregiver will verbalize understanding of skin care regimen Date Initiated: 09/07/2014 Goal Status: Active Ulcer/skin breakdown will heal within 14 weeks Date Initiated: 09/07/2014 Goal Status: Active Interventions: Assess patient/caregiver ability to obtain necessary supplies Assess ulceration(s) every visit Notes: Electronic Signature(s) Signed: 12/20/2014 5:32:28 PM By: Montey Hora Entered By: Montey Hora on 12/20/2014 10:29:22 [name] ([id number]) -------------------------------------------------------------------------------- Patient/Caregiver Education Details Patient Name: [name] Date of Service: 12/20/2014 9:30 AM Medical Record Number: [id number] Patient Account Number: [id number] Date of Birth/Gender: Jul 06, 1922 (81 y.o. Female) Treating RN: Montey Hora Primary Care Physician: Park Liter Other Clinician: Referring Physician: Park Liter Treating Physician/Extender:  Christin Fudge  Weeks in Treatment: 14 Education Assessment Education Provided To: Patient and Caregiver Education Topics Provided Venous: Handouts: Controlling Swelling with Compression Stockings Methods: Explain/Verbal Responses: State content correctly Electronic Signature(s) Signed: 12/20/2014 5:32:28 PM By: Montey Hora Entered By: Montey Hora on 12/20/2014 10:53:08 [name] ([id number]) -------------------------------------------------------------------------------- Wound Assessment Details Patient Name: [name]. Date of Service: 12/20/2014 9:30 AM Medical Record Number: [id number] Patient Account Number: [id number] Date of Birth/Sex: 29-Jul-1922 (81 y.o. Female) Treating RN: Montey Hora Primary Care Physician: Park Liter Other Clinician: Referring Physician: Park Liter Treating Physician/Extender: Frann Rider in Treatment: 14 Wound Status Wound Number: 1 Primary Etiology: Arterial Insufficiency Ulcer Wound Location: Left, Lateral Lower Leg Secondary Etiology: Cellulitis Wounding Event: Gradually Appeared Wound Status: Healed - Epithelialized Date Acquired: 08/17/2014 Weeks Of Treatment: 14 Clustered Wound: No Photos Photo Uploaded By: Montey Hora on 12/20/2014 14:41:18 Wound Measurements Length: (cm) 0 % Reducti Width: (cm) 0 % Reducti Depth: (cm) 0 Area: (cm) 0 Volume: (cm) 0 on in Area: 100% on in Volume: 100% Wound Description Full Thickness Without Exposed Classification: Support Structures Periwound Skin Texture Texture Color No Abnormalities Noted: No No Abnormalities Noted: No Moisture No Abnormalities Noted: No Electronic Signature(s) Signed: 12/20/2014 5:32:28 PM By: Oletta Cohn, Dolly Rias ([id number]) Entered By: Montey Hora on 12/20/2014 10:22:08 [name] ([id number]) -------------------------------------------------------------------------------- Wound Assessment Details Patient  Name: [name]. Date of Service: 12/20/2014 9:30 AM Medical Record Number: [id number] Patient Account Number: [id number] Date of Birth/Sex: 08/05/1922 (81 y.o. Female) Treating RN: Montey Hora Primary Care Physician: Park Liter Other Clinician: Referring Physician: Park Liter Treating Physician/Extender: Frann Rider in Treatment: 14 Wound Status Wound Number: 3 Primary Pressure Ulcer Etiology: Wound Location: Left Foot - Dorsal, Medial Wound Open Wounding Event: Pressure Injury Status: Date Acquired: 11/12/2014 Comorbid Cataracts, Arrhythmia, Hypertension, Weeks Of Treatment: 4 History: Peripheral Arterial Disease, Peripheral Clustered Wound: No Venous Disease, Osteoarthritis, Confinement Anxiety Photos Photo Uploaded By: Montey Hora on 12/20/2014 14:41:18 Wound Measurements Length: (cm) 0.4 Width: (cm) 0.8 Depth: (cm) 0.2 Area: (cm) 0.251 Volume: (cm) 0.05 % Reduction in Area: 84% % Reduction in Volume: 68.2% Epithelialization: None Tunneling: No Undermining: No Wound Description Classification: Category/Stage III Wound Margin: Flat and Intact Exudate Amount: Large Exudate Type: Serous Exudate Color: amber Foul Odor After Cleansing: No Wound Bed Granulation Amount: Medium (34-66%) Exposed Structure Granulation Quality: Pink Fascia Exposed: No Necrotic Amount: Medium (34-66%) Fat Layer Exposed: No Simmonds, Lyrika L. ([id number]) Necrotic Quality: Eschar, Adherent Slough Tendon Exposed: No Muscle Exposed: No Joint Exposed: No Bone Exposed: No Limited to Skin Breakdown Periwound Skin Texture Texture Color No Abnormalities Noted: No No Abnormalities Noted: No Callus: No Atrophie Blanche: No Crepitus: No Cyanosis: No Excoriation: No Ecchymosis: No Fluctuance: No Erythema: No Friable: No Hemosiderin Staining: No Induration: No Mottled: No Localized Edema: No Pallor: No Rash: No Rubor: No Scarring: No Moisture No  Abnormalities Noted: No Dry / Scaly: No Maceration: No Moist: No Wound Preparation Ulcer Cleansing: Other: soap and water, Topical Anesthetic Applied: Other: lidocaine 4%, Treatment Notes Wound #3 (Left, Medial, Dorsal Foot) 1. Cleansed with: Cleanse wound with antibacterial soap and water 2. Anesthetic Topical Lidocaine 4% cream to wound bed prior to debridement 4. Dressing Applied: Aquacel Ag 5. Secondary Dressing Applied ABD Pad 7. Secured with 3 Layer Compression System - Left Lower Extremity Electronic Signature(s) Signed: 12/20/2014 5:32:28 PM By: Montey Hora Entered By: Montey Hora on 12/20/2014 10:24:56 [name] ([id number]) -------------------------------------------------------------------------------- Wound Assessment Details Patient Name: Levander Campion  L. Date of Service: 12/20/2014 9:30 AM Medical Record Number: [id number] Patient Account Number: [id number] Date of Birth/Sex: January 27, 1923 (81 y.o. Female) Treating RN: Montey Hora Primary Care Physician: Park Liter Other Clinician: Referring Physician: Park Liter Treating Physician/Extender: Frann Rider in Treatment: 14 Wound Status Wound Number: 4 Primary Pressure Ulcer Etiology: Wound Location: Left Foot - Dorsal, Lateral Wound Open Wounding Event: Shear/Friction Status: Date Acquired: 11/12/2014 Comorbid Cataracts, Arrhythmia, Hypertension, Weeks Of Treatment: 4 History: Peripheral Arterial Disease, Peripheral Clustered Wound: No Venous Disease, Osteoarthritis, Confinement Anxiety Photos Photo Uploaded By: Montey Hora on 12/20/2014 14:41:35 Wound Measurements Length: (cm) 0.2 Width: (cm) 0.3 Depth: (cm) 0.2 Area: (cm) 0.047 Volume: (cm) 0.009 % Reduction in Area: 95.7% % Reduction in Volume: 91.8% Epithelialization: None Tunneling: No Undermining: No Wound Description Classification: Category/Stage III Wound Margin: Flat and Intact Exudate Amount:  Medium Exudate Type: Serous Exudate Color: amber Foul Odor After Cleansing: No Wound Bed Granulation Amount: Medium (34-66%) Exposed Structure Granulation Quality: Pink Fascia Exposed: No Necrotic Amount: Medium (34-66%) Fat Layer Exposed: No Dewing, Demika L. ([id number]) Necrotic Quality: Eschar, Adherent Slough Tendon Exposed: No Muscle Exposed: No Joint Exposed: No Bone Exposed: No Limited to Skin Breakdown Periwound Skin Texture Texture Color No Abnormalities Noted: No No Abnormalities Noted: No Callus: No Atrophie Blanche: No Crepitus: No Cyanosis: No Excoriation: No Ecchymosis: No Fluctuance: No Erythema: No Friable: No Hemosiderin Staining: No Induration: No Mottled: No Localized Edema: No Pallor: No Rash: No Rubor: No Scarring: No Temperature / Pain Moisture Temperature: No Abnormality No Abnormalities Noted: No Dry / Scaly: No Maceration: No Moist: No Wound Preparation Ulcer Cleansing: Other: soap and water, Topical Anesthetic Applied: Other: lidocaine 4%, Treatment Notes Wound #4 (Left, Lateral, Dorsal Foot) 1. Cleansed with: Cleanse wound with antibacterial soap and water 2. Anesthetic Topical Lidocaine 4% cream to wound bed prior to debridement 4. Dressing Applied: Aquacel Ag 5. Secondary Dressing Applied ABD Pad 7. Secured with 3 Layer Compression System - Left Lower Extremity Electronic Signature(s) Signed: 12/20/2014 5:32:28 PM By: Montey Hora Entered By: Montey Hora on 12/20/2014 10:25:17 [name] ([id number]) -------------------------------------------------------------------------------- Vitals Details Patient Name: [name] Date of Service: 12/20/2014 9:30 AM Medical Record Number: [id number] Patient Account Number: [id number] Date of Birth/Sex: 19-May-1922 (81 y.o. Female) Treating RN: Montey Hora Primary Care Physician: Park Liter Other Clinician: Referring Physician: Park Liter Treating  Physician/Extender: Frann Rider in Treatment: 14 Vital Signs Time Taken: 10:27 Temperature (F): 97.4 Height (in): 67 Pulse (bpm): 65 Weight (lbs): 159 Respiratory Rate (breaths/min): 16 Body Mass Index (BMI): 24.9 Blood Pressure (mmHg): 131/54 Reference Range: 80 - 120 mg / dl Electronic Signature(s) Signed: 12/20/2014 5:32:28 PM By: Montey Hora Entered By: Montey Hora on 12/20/2014 10:28:56

## 2014-12-21 NOTE — Progress Notes (Signed)
[name], [name] ([id number]) Visit Report for 12/20/2014 Chief Complaint Document Details Patient Name: [name]. Date of Service: 12/20/2014 9:30 AM Medical Record Number: [id number] Patient Account Number: [id number] Date of Birth/Sex: Oct 17, 1922 (81 y.o. Female) Treating RN: Montey Hora Primary Care Physician: Park Liter Other Clinician: Referring Physician: Park Liter Treating Physician/Extender: Frann Rider in Treatment: 14 Information Obtained from: Patient Chief Complaint Patient presents to the wound care center today with an open arterial ulcer. The patient has had left lower extremity ulceration for about 1 month. Electronic Signature(s) Signed: 12/20/2014 10:41:29 AM By: Christin Fudge MD, FACS Entered By: Christin Fudge on 12/20/2014 10:41:29 [name] ([id number]) -------------------------------------------------------------------------------- HPI Details Patient Name: [name] Date of Service: 12/20/2014 9:30 AM Medical Record Number: [id number] Patient Account Number: [id number] Date of Birth/Sex: 23-Dec-1922 (81 y.o. Female) Treating RN: Montey Hora Primary Care Physician: Park Liter Other Clinician: Referring Physician: Park Liter Treating Physician/Extender: Frann Rider in Treatment: 14 History of Present Illness Location: left lower extremity ulceration Quality: Patient reports experiencing burning to affected area(s). Severity: Patient states wound are getting worse. Duration: Patient has had the wound for < 4 weeks prior to presenting for treatment Timing: Pain in wound is constant (hurts all the time) Context: The wound appeared gradually over time Modifying Factors: Other treatment(s) tried include:has been on oral anti-buttocks since the beginning of the month and had 10 days of Augmentin and now is on doxycycline. Associated Signs and Symptoms: Patient reports having difficulty standing for long  periods. HPI Description: 81 year old patient was admitted to [facility] between 08/13/2014 and 08/17/2014 with cellulitis of the left lower extremity.duplex study done then showed no DVT and the patient was on IV vancomycin and Rocephin and also received Unasyn later. The patient was sent home on 10 days of Augmentin. Her other comorbidities of atrial fibrillation, essential hypertension, diabetes mellitus type 2, coronary artery disease were managed with appropriate medications. on 09/04/2014 she was seen in the ER and was found to have a 10 cm ulceration on the left lower extremity lateral and superior to the lateral malleolus. The granulation tissue was noted and she was referred to the wound center. the ER notes that her ABIs were within normal limits and she was sent home on consultative management. a duplex scan of the lower extremities was done by the nursing home and this showed that the right ABI was 0.96 with a multiphasic waveform throughout the right leg. There was a monophasic waveform of the left leg suggesting left external iliac artery stenosis with blunting distally and the ABI could not be calculated on the left side. Moderate to severe ischemia was suspected of the left leg and a MR angiogram was recommended. 09/17/2014 -- Xray Left tibia and fibula --- unremarkable tibia and fibula Her wound culture is back and this has grown a heavy growth of staph aureus sensitive to ciprofloxacin and clindamycin and Bactrim. She has been on doxycycline since July 14 for 10 days and we will review her clinically before switching her antibiotics if need be. After reviewing her chart she is not going to be here till 09/17/2014 and hence I will call in Bactrim DS 1 twice a day for 14 days. She has seen Dr. Lucky Cowboy for an opinion and she is scheduled a procedure on this coming Thursday. other than that the pain and swelling continues and she has no other acute  problems. 09/25/2014 -- she had a recent procedure done by Dr. Lucky Cowboy  for diffuse SFA stenosis, popliteal occlusion and tibial disease. she had at angioplasty of the left peroneal artery and tibioperoneal trunk, left superficial femoral artery, left popliteal artery, distal SFA and popliteal artery. the patient is experiencing some swelling of the left lower extremity after the procedure. [name], [name] ([id number]) 10/09/2014 -- she has an appointment to see her vascular surgeon again this weekend for possibly a vascular Doppler. No other issues. 10/23/2014 -- The patient was recently seen by Dr. Lucky Cowboy on this past Friday. We do not have his reports but we do know that he was pleased with her progress and did not plan any further procedure in the near future. 10/30/2014 - we have received Dr. Bunnie Domino evaluation and he has mentioned that the duplex done showed a stenosis versus a lesion in the distal popliteal artery and tibioperoneal trunk in an area below her previous intervention and the overall flow was significantly better and the stent itself was patent. Her perfusion has improved but it was not optimized. Since her wound was improving he did not recommend any angiogram at this point. However if her wound worsens or infection develops he would recommend an angiogram and further evaluation to improve her perfusion. He is going to see her back in 6-8 weeks. 11/20/2014 -- she was not here last week because of her illness and is here to see as after 2 weeks. Her daughter has checked with her insurance company and she will have no out-of-pocket expense for her skin substitute. 12/04/2014 -- she has her skin substitute available but due to the nature of her wounds she will not use it today. 12/20/2014 -- she was at the vascular office yesterday and I believe a workup was done and the family member tells a that no surgical procedure was recommended. We'll await the official reports. Electronic  Signature(s) Signed: 12/20/2014 10:42:03 AM By: Christin Fudge MD, FACS Entered By: Christin Fudge on 12/20/2014 10:42:02 [name] ([id number]) -------------------------------------------------------------------------------- Physical Exam Details Patient Name: [name]. Date of Service: 12/20/2014 9:30 AM Medical Record Number: [id number] Patient Account Number: [id number] Date of Birth/Sex: [birth year] (81 y.o. Female) Treating RN: Montey Hora Primary Care Physician: Park Liter Other Clinician: Referring Physician: Park Liter Treating Physician/Extender: Frann Rider in Treatment: 14 Constitutional . Pulse regular. Respirations normal and unlabored. Afebrile. . Eyes Nonicteric. Reactive to light. Ears, Nose, Mouth, and Throat Lips, teeth, and gums WNL.Marland Kitchen Moist mucosa without lesions . Neck supple and nontender. No palpable supraclavicular or cervical adenopathy. Normal sized without goiter. Respiratory WNL. No retractions.. Cardiovascular Pedal Pulses WNL. is significant edema of the right lower extremity. The left lower extremity which was wrapped is looking good.. Lymphatic No adneopathy. No adenopathy. No adenopathy. Musculoskeletal Adexa without tenderness or enlargement.. Digits and nails w/o clubbing, cyanosis, infection, petechiae, ischemia, or inflammatory conditions.. Integumentary (Hair, Skin) No suspicious lesions. No crepitus or fluctuance. No peri-wound warmth or erythema. No masses.Marland Kitchen Psychiatric Judgement and insight Intact.. No evidence of depression, anxiety, or agitation.. Notes the wound on the dorsum of the left foot has minimal slough and will continue local care. The most superior wound on the lower leg on the left has completely healed. Electronic Signature(s) Signed: 12/20/2014 10:42:52 AM By: Christin Fudge MD, FACS Entered By: Christin Fudge on 12/20/2014 10:42:51 [name]  ([id number]) -------------------------------------------------------------------------------- Physician Orders Details Patient Name: [name] Date of Service: 12/20/2014 9:30 AM Medical Record Number: [id number] Patient Account Number: [id number] Date of Birth/Sex: 1923-01-03 (81 y.o. Female) Treating RN:  Montey Hora Primary Care Physician: Park Liter Other Clinician: Referring Physician: Park Liter Treating Physician/Extender: Frann Rider in Treatment: 42 Verbal / Phone Orders: Yes Clinician: Montey Hora Read Back and Verified: Yes Diagnosis Coding Wound Cleansing Wound #3 Left,Medial,Dorsal Foot o May Shower, gently pat wound dry prior to applying new dressing. o May shower with protection. o No tub bath. Wound #4 Left,Lateral,Dorsal Foot o May Shower, gently pat wound dry prior to applying new dressing. o May shower with protection. o No tub bath. Anesthetic Wound #3 Left,Medial,Dorsal Foot o Topical Lidocaine 4% cream applied to wound bed prior to debridement Wound #4 Left,Lateral,Dorsal Foot o Topical Lidocaine 4% cream applied to wound bed prior to debridement Primary Wound Dressing Wound #3 Left,Medial,Dorsal Foot o Aquacel Ag Wound #4 Left,Lateral,Dorsal Foot o Aquacel Ag Secondary Dressing Wound #3 Left,Medial,Dorsal Foot o ABD pad Wound #4 Left,Lateral,Dorsal Foot o ABD pad Dressing Change Frequency Wound #3 Left,Medial,Dorsal Foot o Change dressing every week [name], Kara L. ([id number]) Wound #4 Left,Lateral,Dorsal Foot o Change dressing every week Edema Control Wound #3 Left,Medial,Dorsal Foot o 3 Layer Compression System - Left Lower Extremity - Profore lite used. o Elevate legs to the level of the heart and pump ankles as often as possible o Other: - Monitor toes for discoloration;blue/purple Wound #4 Left,Lateral,Dorsal Foot o 3 Layer Compression System - Left Lower Extremity - Profore  lite used. o Elevate legs to the level of the heart and pump ankles as often as possible o Other: - Monitor toes for discoloration;blue/purple Notes order Insurance claims handler) Signed: 12/20/2014 3:36:42 PM By: Christin Fudge MD, FACS Signed: 12/20/2014 5:32:28 PM By: Montey Hora Entered By: Montey Hora on 12/20/2014 10:38:03 [name] ([id number]) -------------------------------------------------------------------------------- Problem List Details Patient Name: [name]. Date of Service: 12/20/2014 9:30 AM Medical Record Number: [id number] Patient Account Number: [id number] Date of Birth/Sex: [birth year] (81 y.o. Female) Treating RN: Montey Hora Primary Care Physician: Park Liter Other Clinician: Referring Physician: Park Liter Treating Physician/Extender: Frann Rider in Treatment: 14 Active Problems ICD-10 Encounter Code Description Active Date Diagnosis I70.248 Atherosclerosis of native arteries of left leg with ulceration 09/07/2014 Yes of other part of lower left leg L97.222 Non-pressure chronic ulcer of left calf with fat layer 09/07/2014 Yes exposed L03.116 Cellulitis of left lower limb 09/07/2014 Yes Inactive Problems Resolved Problems Electronic Signature(s) Signed: 12/20/2014 10:41:23 AM By: Christin Fudge MD, FACS Entered By: Christin Fudge on 12/20/2014 10:41:23 [name] ([id number]) -------------------------------------------------------------------------------- Progress Note Details Patient Name: [name]. Date of Service: 12/20/2014 9:30 AM Medical Record Number: [id number] Patient Account Number: [id number] Date of Birth/Sex: 10/15/1922 (81 y.o. Female) Treating RN: Montey Hora Primary Care Physician: Park Liter Other Clinician: Referring Physician: Park Liter Treating Physician/Extender: Frann Rider in Treatment: 14 Subjective Chief Complaint Information obtained from  Patient Patient presents to the wound care center today with an open arterial ulcer. The patient has had left lower extremity ulceration for about 1 month. History of Present Illness (HPI) The following HPI elements were documented for the patient's wound: Location: left lower extremity ulceration Quality: Patient reports experiencing burning to affected area(s). Severity: Patient states wound are getting worse. Duration: Patient has had the wound for < 4 weeks prior to presenting for treatment Timing: Pain in wound is constant (hurts all the time) Context: The wound appeared gradually over time Modifying Factors: Other treatment(s) tried include:has been on oral anti-buttocks since the beginning of the month and had 10 days of Augmentin and now is  on doxycycline. Associated Signs and Symptoms: Patient reports having difficulty standing for long periods. 81 year old patient was admitted to [facility] between 08/13/2014 and 08/17/2014 with cellulitis of the left lower extremity.duplex study done then showed no DVT and the patient was on IV vancomycin and Rocephin and also received Unasyn later. The patient was sent home on 10 days of Augmentin. Her other comorbidities of atrial fibrillation, essential hypertension, diabetes mellitus type 2, coronary artery disease were managed with appropriate medications. on 09/04/2014 she was seen in the ER and was found to have a 10 cm ulceration on the left lower extremity lateral and superior to the lateral malleolus. The granulation tissue was noted and she was referred to the wound center. the ER notes that her ABIs were within normal limits and she was sent home on consultative management. a duplex scan of the lower extremities was done by the nursing home and this showed that the right ABI was 0.96 with a multiphasic waveform throughout the right leg. There was a monophasic waveform of the left leg suggesting left external iliac  artery stenosis with blunting distally and the ABI could not be calculated on the left side. Moderate to severe ischemia was suspected of the left leg and a MR angiogram was recommended. 09/17/2014 -- Xray Left tibia and fibula --- unremarkable tibia and fibula Her wound culture is back and this has grown a heavy growth of staph aureus sensitive to ciprofloxacin and clindamycin and Bactrim. She has been on doxycycline since July 14 for 10 days and we will review her clinically before switching her antibiotics if need be. After reviewing her chart she is not going to be here till 09/17/2014 and hence I will [name], Kara L. ([id number]) call in Bactrim DS 1 twice a day for 14 days. She has seen Dr. Lucky Cowboy for an opinion and she is scheduled a procedure on this coming Thursday. other than that the pain and swelling continues and she has no other acute problems. 09/25/2014 -- she had a recent procedure done by Dr. Lucky Cowboy for diffuse SFA stenosis, popliteal occlusion and tibial disease. she had at angioplasty of the left peroneal artery and tibioperoneal trunk, left superficial femoral artery, left popliteal artery, distal SFA and popliteal artery. the patient is experiencing some swelling of the left lower extremity after the procedure. 10/09/2014 -- she has an appointment to see her vascular surgeon again this weekend for possibly a vascular Doppler. No other issues. 10/23/2014 -- The patient was recently seen by Dr. Lucky Cowboy on this past Friday. We do not have his reports but we do know that he was pleased with her progress and did not plan any further procedure in the near future. 10/30/2014 - we have received Dr. Bunnie Domino evaluation and he has mentioned that the duplex done showed a stenosis versus a lesion in the distal popliteal artery and tibioperoneal trunk in an area below her previous intervention and the overall flow was significantly better and the stent itself was patent. Her perfusion  has improved but it was not optimized. Since her wound was improving he did not recommend any angiogram at this point. However if her wound worsens or infection develops he would recommend an angiogram and further evaluation to improve her perfusion. He is going to see her back in 6-8 weeks. 11/20/2014 -- she was not here last week because of her illness and is here to see as after 2 weeks. Her daughter has checked with her insurance company and she  will have no out-of-pocket expense for her skin substitute. 12/04/2014 -- she has her skin substitute available but due to the nature of her wounds she will not use it today. 12/20/2014 -- she was at the vascular office yesterday and I believe a workup was done and the family member tells a that no surgical procedure was recommended. We'll await the official reports. Objective Constitutional Pulse regular. Respirations normal and unlabored. Afebrile. Vitals Time Taken: 10:27 AM, Height: 67 in, Weight: 159 lbs, BMI: 24.9, Temperature: 97.4 F, Pulse: 65 bpm, Respiratory Rate: 16 breaths/min, Blood Pressure: 131/54 mmHg. Eyes Nonicteric. Reactive to light. Ears, Nose, Mouth, and Throat Lips, teeth, and gums WNL.Marland Kitchen Moist mucosa without lesions . Neck [name], Kara L. ([id number]) supple and nontender. No palpable supraclavicular or cervical adenopathy. Normal sized without goiter. Respiratory WNL. No retractions.. Cardiovascular Pedal Pulses WNL. is significant edema of the right lower extremity. The left lower extremity which was wrapped is looking good.. Lymphatic No adneopathy. No adenopathy. No adenopathy. Musculoskeletal Adexa without tenderness or enlargement.. Digits and nails w/o clubbing, cyanosis, infection, petechiae, ischemia, or inflammatory conditions.Marland Kitchen Psychiatric Judgement and insight Intact.. No evidence of depression, anxiety, or agitation.. General Notes: the wound on the dorsum of the left foot has minimal slough and  will continue local care. The most superior wound on the lower leg on the left has completely healed. Integumentary (Hair, Skin) No suspicious lesions. No crepitus or fluctuance. No peri-wound warmth or erythema. No masses.. Wound #1 status is Healed - Epithelialized. Original cause of wound was Gradually Appeared. The wound is located on the Left,Lateral Lower Leg. The wound measures 0cm length x 0cm width x 0cm depth; 0cm^2 area and 0cm^3 volume. Wound #3 status is Open. Original cause of wound was Pressure Injury. The wound is located on the Left,Medial,Dorsal Foot. The wound measures 0.4cm length x 0.8cm width x 0.2cm depth; 0.251cm^2 area and 0.05cm^3 volume. The wound is limited to skin breakdown. There is no tunneling or undermining noted. There is a large amount of serous drainage noted. The wound margin is flat and intact. There is medium (34-66%) pink granulation within the wound bed. There is a medium (34-66%) amount of necrotic tissue within the wound bed including Eschar and Adherent Slough. The periwound skin appearance did not exhibit: Callus, Crepitus, Excoriation, Fluctuance, Friable, Induration, Localized Edema, Rash, Scarring, Dry/Scaly, Maceration, Moist, Atrophie Blanche, Cyanosis, Ecchymosis, Hemosiderin Staining, Mottled, Pallor, Rubor, Erythema. Wound #4 status is Open. Original cause of wound was Shear/Friction. The wound is located on the Left,Lateral,Dorsal Foot. The wound measures 0.2cm length x 0.3cm width x 0.2cm depth; 0.047cm^2 area and 0.009cm^3 volume. The wound is limited to skin breakdown. There is no tunneling or undermining noted. There is a medium amount of serous drainage noted. The wound margin is flat and intact. There is medium (34-66%) pink granulation within the wound bed. There is a medium (34-66%) amount of necrotic tissue within the wound bed including Eschar and Adherent Slough. The periwound skin appearance did not exhibit: Callus, Crepitus,  Excoriation, Fluctuance, Friable, Induration, Localized Edema, Rash, Scarring, Dry/Scaly, Maceration, Moist, Atrophie Blanche, Cyanosis, Ecchymosis, Hemosiderin Staining, Mottled, Pallor, Rubor, Erythema. Periwound temperature was noted as No Abnormality. [name], [name] ([id number]) Assessment Active Problems ICD-10 I70.248 - Atherosclerosis of native arteries of left leg with ulceration of other part of lower left leg L97.222 - Non-pressure chronic ulcer of left calf with fat layer exposed L03.116 - Cellulitis of left lower limb She finds it difficult to use her compression stocking  on her right leg and we will see if we can order her some juice Juzo stockings. She will need these for both legs. I have recommended calcium alginate with silver to the left foot with a 3 layer compression wrap. I have encouraged her to elevate her limbs and use her compression stockings regularly. See her back next week. Plan Wound Cleansing: Wound #3 Left,Medial,Dorsal Foot: May Shower, gently pat wound dry prior to applying new dressing. May shower with protection. No tub bath. Wound #4 Left,Lateral,Dorsal Foot: May Shower, gently pat wound dry prior to applying new dressing. May shower with protection. No tub bath. Anesthetic: Wound #3 Left,Medial,Dorsal Foot: Topical Lidocaine 4% cream applied to wound bed prior to debridement Wound #4 Left,Lateral,Dorsal Foot: Topical Lidocaine 4% cream applied to wound bed prior to debridement Primary Wound Dressing: Wound #3 Left,Medial,Dorsal Foot: Aquacel Ag Wound #4 Left,Lateral,Dorsal Foot: Aquacel Ag Secondary Dressing: Wound #3 Left,Medial,Dorsal Foot: ABD pad Wound #4 Left,Lateral,Dorsal Foot: [name], Kara L. ([id number]) ABD pad Dressing Change Frequency: Wound #3 Left,Medial,Dorsal Foot: Change dressing every week Wound #4 Left,Lateral,Dorsal Foot: Change dressing every week Edema Control: Wound #3 Left,Medial,Dorsal Foot: 3 Layer  Compression System - Left Lower Extremity - Profore lite used. Elevate legs to the level of the heart and pump ankles as often as possible Other: - Monitor toes for discoloration;blue/purple Wound #4 Left,Lateral,Dorsal Foot: 3 Layer Compression System - Left Lower Extremity - Profore lite used. Elevate legs to the level of the heart and pump ankles as often as possible Other: - Monitor toes for discoloration;blue/purple General Notes: order Juzo She finds it difficult to use her compression stocking on her right leg and we will see if we can order her some juice Juzo stockings. She will need these for both legs. I have recommended calcium alginate with silver to the left foot with a 3 layer compression wrap. I have encouraged her to elevate her limbs and use her compression stockings regularly. See her back next week. Electronic Signature(s) Signed: 12/20/2014 10:44:22 AM By: Christin Fudge MD, FACS Entered By: Christin Fudge on 12/20/2014 10:44:22 [name] ([id number]) -------------------------------------------------------------------------------- SuperBill Details Patient Name: [name] Date of Service: 12/20/2014 Medical Record Number: [id number] Patient Account Number: [id number] Date of Birth/Sex: 02-19-1922 (81 y.o. Female) Treating RN: Montey Hora Primary Care Physician: Park Liter Other Clinician: Referring Physician: Park Liter Treating Physician/Extender: Frann Rider in Treatment: 14 Diagnosis Coding ICD-10 Codes Code Description I70.248 Atherosclerosis of native arteries of left leg with ulceration of other part of lower left leg L97.222 Non-pressure chronic ulcer of left calf with fat layer exposed L03.116 Cellulitis of left lower limb Facility Procedures CPT4: Description Modifier Quantity Code [id number] (Facility Use Only) [phone] - [facility] COMPRS LWR LT 1 LEG Physician Procedures CPT4: Description Modifier Quantity Code [id number]  [id number] - WC PHYS LEVEL 3 - EST PT 1 ICD-10 Description Diagnosis I70.248 Atherosclerosis of native arteries of left leg with ulceration of other part of lower left leg L97.222 Non-pressure chronic ulcer of  left calf with fat layer exposed L03.116 Cellulitis of left lower limb Electronic Signature(s) Signed: 12/20/2014 10:44:36 AM By: Christin Fudge MD, FACS Entered By: Christin Fudge on 12/20/2014 10:44:36

## 2014-12-26 ENCOUNTER — Ambulatory Visit: Payer: Medicare Other | Admitting: Surgery

## 2015-01-02 ENCOUNTER — Encounter: Payer: Self-pay | Admitting: Family Medicine

## 2015-01-02 ENCOUNTER — Ambulatory Visit (INDEPENDENT_AMBULATORY_CARE_PROVIDER_SITE_OTHER): Payer: Medicare Other | Admitting: Family Medicine

## 2015-01-02 VITALS — BP 111/72 | HR 89 | Temp 98.4°F

## 2015-01-02 DIAGNOSIS — H01003 Unspecified blepharitis right eye, unspecified eyelid: Secondary | ICD-10-CM

## 2015-01-02 DIAGNOSIS — R946 Abnormal results of thyroid function studies: Secondary | ICD-10-CM

## 2015-01-02 DIAGNOSIS — R7989 Other specified abnormal findings of blood chemistry: Secondary | ICD-10-CM

## 2015-01-02 DIAGNOSIS — I10 Essential (primary) hypertension: Secondary | ICD-10-CM | POA: Diagnosis not present

## 2015-01-02 DIAGNOSIS — E039 Hypothyroidism, unspecified: Secondary | ICD-10-CM

## 2015-01-02 DIAGNOSIS — R609 Edema, unspecified: Secondary | ICD-10-CM | POA: Diagnosis not present

## 2015-01-02 DIAGNOSIS — F329 Major depressive disorder, single episode, unspecified: Secondary | ICD-10-CM | POA: Diagnosis not present

## 2015-01-02 DIAGNOSIS — D696 Thrombocytopenia, unspecified: Secondary | ICD-10-CM

## 2015-01-02 DIAGNOSIS — I639 Cerebral infarction, unspecified: Secondary | ICD-10-CM

## 2015-01-02 DIAGNOSIS — R410 Disorientation, unspecified: Secondary | ICD-10-CM

## 2015-01-02 DIAGNOSIS — F32A Depression, unspecified: Secondary | ICD-10-CM

## 2015-01-02 LAB — CBC WITH DIFFERENTIAL/PLATELET
HEMATOCRIT: 41.5 % (ref 34.0–46.6)
Hemoglobin: 12.8 g/dL (ref 11.1–15.9)
LYMPHS ABS: 1.7 10*3/uL (ref 0.7–3.1)
LYMPHS: 17 %
MCH: 26.1 pg — ABNORMAL LOW (ref 26.6–33.0)
MCHC: 30.8 g/dL — ABNORMAL LOW (ref 31.5–35.7)
MCV: 85 fL (ref 79–97)
MID (ABSOLUTE): 0.8 10*3/uL (ref 0.1–1.6)
MID: 8 %
Neutrophils Absolute: 7.5 10*3/uL — ABNORMAL HIGH (ref 1.4–7.0)
Neutrophils: 74 %
Platelets: 250 10*3/uL (ref 150–379)
RBC: 4.9 x10E6/uL (ref 3.77–5.28)
RDW: 16.7 % — AB (ref 12.3–15.4)
WBC: 10 10*3/uL (ref 3.4–10.8)

## 2015-01-02 MED ORDER — LISINOPRIL 20 MG PO TABS: 20.0000 mg | ORAL_TABLET | ORAL | Status: DC

## 2015-01-02 MED ORDER — CITALOPRAM HYDROBROMIDE 20 MG PO TABS: 20.0000 mg | ORAL_TABLET | Freq: Every day | ORAL | Status: DC

## 2015-01-02 MED ORDER — BACITRACIN-POLYMYXIN B 500-10000 UNIT/GM OP OINT: 1.0000 "application " | TOPICAL_OINTMENT | Freq: Two times a day (BID) | OPHTHALMIC | Status: AC

## 2015-01-02 NOTE — Assessment & Plan Note (Signed)
At last check. Possibly due to recent hospitalization. Rechecking today- will treat as needed pending results.

## 2015-01-02 NOTE — Progress Notes (Signed)
BP 111/72 mmHg  Pulse 89  Temp(Src) 98.4 F (36.9 C)  Ht   Wt   SpO2 100%   Subjective:    Patient ID: [name], female    DOB: 02/09/1923, 81 y.o.   MRN: [id number]  HPI: [name] is a 81 y.o. female  Chief Complaint  Patient presents with  . Fatigue  . Altered Mental Status  . Eye Pain   Has been getting confused at night. Has been saying that her legs and her feet hurt at night. Doesn't sleep well. Has been taking xanax at night. Wakes up in the middle of the night and can't remember if she went to the bathroom or not. Can't remember if she's been asleep. Notes that the tylenol is helping with her pain. Still feeling very fatigued and not like herself. Happy to be home from the hospital and SNF, but doesn't feel like she has been bouncing back. Her son is concerned, as is her daughter.   Has been having PT come in 2x a week Having an aide 2x a week Nurse came a couple of times, but isn't now.  She likes all the providers that are coming in.   BP has been lower than it usually is. Feeling very fatigued. Falling asleep in her chair. Has been feeling dizzy as well. BP medicine was changed at the SNF. She was usually running in the 140s for years and had been doing well with that.   Has been seeing the wound center- leg looking worse and oozing in the last week, R leg is acting up where as they have been focusing mainly on the L. She doesn't take the stocking off at night. Doesn't think that there is any skin break down, but was wondering what to do. They are going to see the wound center tomorrow.   Still Seeing Dr. Lucky Cowboy- doing well. Doesn't need to see him again for several months.   Started with redness and crusting of the R eye a couple of days ago. Not sure if she rubbed it. Very itchy, slighty burning. Nothing makes it better, nothing makes it worse. No pain in the eye itself, more of irritation. No fevers. No colds. Otherwise feeling OK except the fatigue.    Relevant past medical, surgical, family and social history reviewed and updated as indicated. Interim medical history since our last visit reviewed. Allergies and medications reviewed and updated.  Review of Systems  Constitutional: Negative.   HENT: Negative.   Eyes: Negative.   Respiratory: Negative.   Cardiovascular: Negative.   Psychiatric/Behavioral: Negative.     Per HPI unless specifically indicated above     Objective:    BP 111/72 mmHg  Pulse 89  Temp(Src) 98.4 F (36.9 C)  Ht   Wt   SpO2 100%  Wt Readings from Last 3 Encounters:  12/07/14 155 lb (70.308 kg)  11/29/14 154 lb (69.854 kg)  09/20/14 147 lb (66.679 kg)    Physical Exam  Constitutional: She is oriented to person, place, and time. She appears well-developed and well-nourished. No distress.  HENT:  Head: Normocephalic and atraumatic.  Right Ear: Hearing normal.  Left Ear: Hearing normal.  Nose: Nose normal.  Eyes: Conjunctivae and lids are normal. Right eye exhibits chemosis and exudate. Right eye exhibits no discharge and no hordeolum. No foreign body present in the right eye. Left eye exhibits no discharge. Right conjunctiva is not injected. Right conjunctiva has no hemorrhage. Left conjunctiva is not injected.  Left conjunctiva has no hemorrhage. No scleral icterus. Right eye exhibits normal extraocular motion and no nystagmus. Left eye exhibits normal extraocular motion and no nystagmus. Right pupil is round and reactive. Left pupil is round and reactive. Pupils are equal.  Irritation of the eye lid with crusting on the R side  Cardiovascular: Normal rate, regular rhythm and intact distal pulses.  Exam reveals no gallop and no friction rub.   Murmur heard. Pulmonary/Chest: Effort normal and breath sounds normal. No respiratory distress. She has no wheezes. She has no rales. She exhibits no tenderness.  Musculoskeletal: Normal range of motion.  Neurological: She is alert and oriented to person,  place, and time.  Skin: Skin is warm, dry and intact. No rash noted. No erythema. No pallor.  Oozing clear fluid from R leg onto the ted. Mild skin break down that appears to be healing about 2 inches below the knee on the R side and at the ankle midline  Psychiatric: She has a normal mood and affect. Her speech is normal and behavior is normal. Judgment and thought content normal. Cognition and memory are normal.    Results for orders placed or performed in visit on 01/02/15  CBC With Differential/Platelet  Result Value Ref Range   WBC 10.0 3.4 - 10.8 x10E3/uL   RBC 4.90 3.77 - 5.28 x10E6/uL   Hemoglobin 12.8 11.1 - 15.9 g/dL   Hematocrit 41.5 34.0 - 46.6 %   MCV 85 79 - 97 fL   MCH 26.1 (L) 26.6 - 33.0 pg   MCHC 30.8 (L) 31.5 - 35.7 g/dL   RDW 16.7 (H) 12.3 - 15.4 %   Platelets 250 150 - 379 x10E3/uL   Neutrophils 74 %   Lymphs 17 %   MID 8 %   Neutrophils Absolute 7.5 (H) 1.4 - 7.0 x10E3/uL   Lymphocytes Absolute 1.7 0.7 - 3.1 x10E3/uL   MID (Absolute) 0.8 0.1 - 1.6 X10E3/uL      Assessment & Plan:   Problem List Items Addressed This Visit      Cardiovascular and Mediastinum   Essential hypertension, benign - Primary    BP too low for age. Likely causing the fatigue. Will cut her lisinopril in 1/2 and recheck in 1 month.       Relevant Medications   lisinopril (PRINIVIL,ZESTRIL) 20 MG tablet     Endocrine   Hypothyroidism    At last check. Possibly due to recent hospitalization. Rechecking today- will treat as needed pending results.         Other   Depression    Not well controlled on her current regimen. Will increase her citalopram to 20mg  daily and recheck in 1 month. Await Thyroid labs as well as see how she does off her xanax and vicodin which are likely leading to fatigue and confusion.       Relevant Medications   citalopram (CELEXA) 20 MG tablet   Edema    To see wound center tomorrow. Advised them to discuss further options for the R leg as well        Thrombocytopenia ([place])    Resolved on recheck today.       Elevated TSH    At last check. Possibly due to recent hospitalization. Rechecking today- will treat as needed pending results.        Other Visit Diagnoses    Confusion        Likely due to medication. Will have them stop the xanax and see  how she does. Advised them to also stop the vicodin.     Blepharitis, right        Information given. Treat with bacitracin given erythromycin allergy. Continue to monitor.         Follow up plan: Return in about 4 weeks (around 01/30/2015) for Follow up BP and depression.

## 2015-01-02 NOTE — Patient Instructions (Addendum)
Citalopram- take 2 for a total of 20mg  at night Cut the lisinopril in 1/2- for 20 mg instead of 40 mg Try to avoid the alprazolam as it will make her confused when she wakes up Try to avoid the vicodin as it will make her confused when she wakes up Take the bacitracin on the eyelid to treat. Call if not getting better.   Blepharitis Blepharitis means swollen eyelids. HOME CARE Pay attention to any changes in how you look or feel. Follow these instructions to help with your condition: Keeping Clean  Wash your hands often.  Wash your eyelids with warm water, or wash them with warm water that is mixed with little bit of baby shampoo. Do this 2 or more times per day.  Wash your face and eyebrows at least once a day.  Use a clean towel each time you dry your eyelids. Do not use the towel to clean or dry other areas of your body. Do not share your towel with anyone. General Instructions  Avoid wearing makeup until you get better. Do not share makeup with anyone.  Avoid rubbing your eyes.  Put a warm compress on your eyes 2 times per day for 10 minutes at a time or as told by your doctor.  If you were told to use an medicated cream or eye drops, use the medicine as told by your doctor. Do not stop using the medicine even if you feel better.  Keep all follow-up visits as told by your doctor. This is important. GET HELP IF:  Your eyelids feel hot.  You have blisters on your eyelids.  You have a rash on your eyelids.  The swelling does not go away in 2-4 days.  The swelling gets worse. GET HELP RIGHT AWAY IF:  You have pain that gets worse.  You have pain that spreads to other parts of your face.  You have redness that gets worse.  You have redness that spreads to other parts of your face.  Your vision changes.  You have pain when you look at lights or things that move.  You have a fever.   This information is not intended to replace advice given to you by your health  care provider. Make sure you discuss any questions you have with your health care provider.   Document Released: 11/12/2007 Document Revised: 10/24/2014 Document Reviewed: 05/28/2014 Elsevier Interactive Patient Education [employer].

## 2015-01-02 NOTE — Assessment & Plan Note (Signed)
Not well controlled on her current regimen. Will increase her citalopram to 20mg  daily and recheck in 1 month. Await Thyroid labs as well as see how she does off her xanax and vicodin which are likely leading to fatigue and confusion.

## 2015-01-02 NOTE — Assessment & Plan Note (Signed)
BP too low for age. Likely causing the fatigue. Will cut her lisinopril in 1/2 and recheck in 1 month.

## 2015-01-02 NOTE — Assessment & Plan Note (Signed)
To see wound center tomorrow. Advised them to discuss further options for the R leg as well

## 2015-01-02 NOTE — Assessment & Plan Note (Signed)
Resolved on recheck today 

## 2015-01-03 ENCOUNTER — Encounter: Payer: Medicare Other | Admitting: Surgery

## 2015-01-03 ENCOUNTER — Telehealth: Payer: Self-pay | Admitting: Family Medicine

## 2015-01-03 DIAGNOSIS — I70248 Atherosclerosis of native arteries of left leg with ulceration of other part of lower left leg: Secondary | ICD-10-CM | POA: Diagnosis not present

## 2015-01-03 LAB — T3: T3, Total: 86 ng/dL (ref 71–180)

## 2015-01-03 LAB — TSH: TSH: 10.65 u[IU]/mL — AB (ref 0.450–4.500)

## 2015-01-03 LAB — T4: T4, Total: 5.8 ug/dL (ref 4.5–12.0)

## 2015-01-03 MED ORDER — LEVOTHYROXINE SODIUM 25 MCG PO TABS: 25.0000 ug | ORAL_TABLET | Freq: Every day | ORAL | Status: AC

## 2015-01-03 NOTE — Telephone Encounter (Signed)
Called to give daughter results. Will start thyroid medicine. Will reschedule next appointment out 2 more weeks so that we can recheck thyroid at that time to adjust dose.

## 2015-01-04 NOTE — Progress Notes (Addendum)
SHANYCE, KORSMO ([id number]) Visit Report for 01/03/2015 Arrival Information Details Patient Name: [name], [name]. Date of Service: 01/03/2015 10:00 AM Medical Record Number: [id number] Patient Account Number: [id number] Date of Birth/Sex: 1923/02/11 (81 y.o. Female) Treating RN: Afful, RN, BSN, Velva Harman Primary Care Physician: Park Liter Other Clinician: Referring Physician: Park Liter Treating Physician/Extender: BURNS III, Charlean Sanfilippo in Treatment: 16 Visit Information History Since Last Visit Any new allergies or adverse reactions: No Patient Arrived: Wheel Chair Had a fall or experienced change in No Arrival Time: 10:08 activities of daily [name] that may affect Accompanied By: dtr risk of falls: Transfer Assistance: None Signs or symptoms of abuse/neglect since last No Patient Identification Verified: Yes visito Secondary Verification Process Yes Hospitalized since last visit: No Completed: Has Dressing in Place as Prescribed: Yes Patient Requires Transmission- No Pain Present Now: No Based Precautions: Patient Has Alerts: Yes Patient Alerts: Patient on Blood Thinner Eliquis. R ABI:0.96 Electronic Signature(s) Signed: 01/03/2015 10:08:29 AM By: Regan Lemming BSN, RN Entered By: Regan Lemming on 01/03/2015 [id number] [name] ([id number]) -------------------------------------------------------------------------------- Encounter Discharge Information Details Patient Name: [name]. Date of Service: 01/03/2015 10:00 AM Medical Record Number: [id number] Patient Account Number: [id number] Date of Birth/Sex: 02-01-1923 (81 y.o. Female) Treating RN: Baruch Gouty, RN, BSN, Velva Harman Primary Care Physician: Park Liter Other Clinician: Referring Physician: Park Liter Treating Physician/Extender: BURNS III, Charlean Sanfilippo in Treatment: 16 Encounter Discharge Information Items Discharge Pain Level: 0 Discharge Condition: Stable Ambulatory Status:  Wheelchair Discharge Destination: Home Transportation: Other Accompanied By: dtr Schedule Follow-up Appointment: No Medication Reconciliation completed and provided to Patient/Care No Jleigh Striplin: Provided on Clinical Summary of Care: 01/03/2015 Form Type Recipient Paper Patient LS Electronic Signature(s) Signed: 01/03/2015 10:47:37 AM By: Ruthine Dose Previous Signature: 01/03/2015 10:47:05 AM Version By: Regan Lemming BSN, RN Entered By: Ruthine Dose on 01/03/2015 10:47:36 [name] ([id number]) -------------------------------------------------------------------------------- Lower Extremity Assessment Details Patient Name: [name]. Date of Service: 01/03/2015 10:00 AM Medical Record Number: [id number] Patient Account Number: [id number] Date of Birth/Sex: 1922-11-12 (81 y.o. Female) Treating RN: Afful, RN, BSN, Velva Harman Primary Care Physician: Park Liter Other Clinician: Referring Physician: Park Liter Treating Physician/Extender: BURNS III, Charlean Sanfilippo in Treatment: 16 Edema Assessment Assessed: [Left: No] [Right: No] E[Left: dema] [Right: :] Calf Left: Right: Point of Measurement: 37 cm From Medial Instep 30.4 cm cm Ankle Left: Right: Point of Measurement: 15 cm From Medial Instep 20.3 cm cm Vascular Assessment Claudication: Claudication Assessment [Left:None] Pulses: Posterior Tibial Dorsalis Pedis Palpable: [Left:Yes] Extremity colors, hair growth, and conditions: Extremity Color: [Left:Mottled] Hair Growth on Extremity: [Left:No] Temperature of Extremity: [Left:Warm] Capillary Refill: [Left:< 3 seconds] Electronic Signature(s) Signed: 01/03/2015 10:09:51 AM By: Regan Lemming BSN, RN Entered By: Regan Lemming on 01/03/2015 10:09:51 [name] ([id number]) -------------------------------------------------------------------------------- Multi Wound Chart Details Patient Name: [name]. Date of Service: 01/03/2015 10:00 AM Medical  Record Number: [id number] Patient Account Number: [id number] Date of Birth/Sex: [birth year] (81 y.o. Female) Treating RN: Baruch Gouty, RN, BSN, Velva Harman Primary Care Physician: Park Liter Other Clinician: Referring Physician: Park Liter Treating Physician/Extender: BURNS III, WALTER Weeks in Treatment: 16 Vital Signs Height(in): 67 Pulse(bpm): 68 Weight(lbs): 159 Blood Pressure 118/61 (mmHg): Body Mass Index(BMI): 25 Temperature(F): 97.8 Respiratory Rate 16 (breaths/min): Photos: [3:No Photos] [4:No Photos] [N/A:N/A] Wound Location: [3:Left Foot - Dorsal, Medial] [4:Left Foot - Dorsal, Lateral] [N/A:N/A] Wounding Event: [3:Pressure Injury] [4:Shear/Friction] [N/A:N/A] Primary Etiology: [3:Pressure Ulcer] [4:Pressure Ulcer] [N/A:N/A] Comorbid History: [3:Cataracts, Arrhythmia, Hypertension, Peripheral Arterial Disease, Peripheral Venous Disease, Osteoarthritis, Confinement Anxiety] [4:Cataracts,  Arrhythmia, Hypertension, Peripheral Arterial Disease, Peripheral Venous Disease,  Osteoarthritis, Confinement Anxiety] [N/A:N/A] Date Acquired: [3:11/12/2014] [4:11/12/2014] [N/A:N/A] Weeks of Treatment: [3:6] [4:6] [N/A:N/A] Wound Status: [3:Open] [4:Open] [N/A:N/A] Measurements L x W x D 0.4x0.7x0.2 [4:0.8x0.5x0.2] [N/A:N/A] (cm) Area (cm) : [3:0.22] [4:0.314] [N/A:N/A] Volume (cm) : [3:0.044] [4:0.063] [N/A:N/A] % Reduction in Area: [3:86.00%] [4:71.50%] [N/A:N/A] % Reduction in Volume: 72.00% [4:42.70%] [N/A:N/A] Classification: [3:Category/Stage III] [4:Category/Stage III] [N/A:N/A] Exudate Amount: [3:Medium] [4:Medium] [N/A:N/A] Exudate Type: [3:Serous] [4:Serous] [N/A:N/A] Exudate Color: [3:amber] [4:amber] [N/A:N/A] Wound Margin: [3:Flat and Intact] [4:Flat and Intact] [N/A:N/A] Granulation Amount: [3:Medium (34-66%)] [4:Medium (34-66%)] [N/A:N/A] Granulation Quality: [3:Pink] [4:Pink] [N/A:N/A] Necrotic Amount: [3:Medium (34-66%)] [4:Medium (34-66%)] [N/A:N/A] Necrotic  Tissue: [3:Eschar, Adherent Slough] [4:Adherent Slough] [N/A:N/A] Exposed Structures: [3:Fascia: No Fat: No] [4:Fascia: No Fat: No] [N/A:N/A] Tendon: No Tendon: No Muscle: No Muscle: No Joint: No Joint: No Bone: No Bone: No Limited to Skin Limited to Skin Breakdown Breakdown Epithelialization: None None N/A Periwound Skin Texture: Edema: No Edema: Yes N/A Excoriation: No Excoriation: No Induration: No Induration: No Callus: No Callus: No Crepitus: No Crepitus: No Fluctuance: No Fluctuance: No Friable: No Friable: No Rash: No Rash: No Scarring: No Scarring: No Periwound Skin Moist: Yes Moist: Yes N/A Moisture: Maceration: No Maceration: No Dry/Scaly: No Dry/Scaly: No Periwound Skin Color: Atrophie Blanche: No Atrophie Blanche: No N/A Cyanosis: No Cyanosis: No Ecchymosis: No Ecchymosis: No Erythema: No Erythema: No Hemosiderin Staining: No Hemosiderin Staining: No Mottled: No Mottled: No Pallor: No Pallor: No Rubor: No Rubor: No Temperature: No Abnormality No Abnormality N/A Tenderness on No No N/A Palpation: Wound Preparation: Ulcer Cleansing: Other: Ulcer Cleansing: Other: N/A soap and water soap and water Topical Anesthetic Topical Anesthetic Applied: Other: lidocaine Applied: Other: lidocaine 4% 4% Treatment Notes Electronic Signature(s) Signed: 01/03/2015 10:26:21 AM By: Regan Lemming BSN, RN Entered By: Regan Lemming on 01/03/2015 10:26:21 [name] ([id number]) -------------------------------------------------------------------------------- [place] Details Patient Name: [name] Date of Service: 01/03/2015 10:00 AM Medical Record Number: [id number] Patient Account Number: [id number] Date of Birth/Sex: [birth year] (81 y.o. Female) Treating RN: Afful, RN, BSN, Velva Harman Primary Care Physician: Park Liter Other Clinician: Referring Physician: Park Liter Treating Physician/Extender: BURNS III, Charlean Sanfilippo  in Treatment: 16 Active Inactive Abuse / Safety / Falls / Self Care Management Nursing Diagnoses: Potential for falls Goals: Patient will remain injury free Date Initiated: 09/07/2014 Goal Status: Active Interventions: Assess fall risk on admission and as needed Notes: Nutrition Nursing Diagnoses: Potential for alteratiion in Nutrition/Potential for imbalanced nutrition Goals: Patient/caregiver agrees to and verbalizes understanding of need to use nutritional supplements and/or vitamins as prescribed Date Initiated: 09/07/2014 Goal Status: Active Interventions: Assess patient nutrition upon admission and as needed per policy Notes: Orientation to the Wound Care Program Nursing Diagnoses: Knowledge deficit related to the wound healing center program Goals: Patient/caregiver will verbalize understanding of the [place] ([id number]) Date Initiated: 09/07/2014 Goal Status: Active Interventions: Provide education on orientation to the wound center Notes: Wound/Skin Impairment Nursing Diagnoses: Impaired tissue integrity Goals: Patient/caregiver will verbalize understanding of skin care regimen Date Initiated: 09/07/2014 Goal Status: Active Ulcer/skin breakdown will heal within 14 weeks Date Initiated: 09/07/2014 Goal Status: Active Interventions: Assess patient/caregiver ability to obtain necessary supplies Assess ulceration(s) every visit Notes: Electronic Signature(s) Signed: 01/03/2015 10:25:40 AM By: Regan Lemming BSN, RN Entered By: Regan Lemming on 01/03/2015 10:25:40 [name] ([id number]) -------------------------------------------------------------------------------- Pain Assessment Details Patient Name: [name]. Date of Service: 01/03/2015 10:00 AM Medical Record Number: [id number]  Patient Account Number: [id number] Date of Birth/Sex: Jul 16, 1922 (81 y.o. Female) Treating RN: Baruch Gouty, RN, BSN, Velva Harman Primary Care  Physician: Park Liter Other Clinician: Referring Physician: Park Liter Treating Physician/Extender: BURNS III, Charlean Sanfilippo in Treatment: 16 Active Problems Location of Pain Severity and Description of Pain Patient Has Paino No Site Locations Pain Management and Medication Current Pain Management: Electronic Signature(s) Signed: 01/03/2015 10:08:34 AM By: Regan Lemming BSN, RN Entered By: Regan Lemming on 01/03/2015 10:08:34 [name] ([id number]) -------------------------------------------------------------------------------- Patient/Caregiver Education Details Patient Name: [name]. Date of Service: 01/03/2015 10:00 AM Medical Record Number: [id number] Patient Account Number: [id number] Date of Birth/Gender: 03/06/1922 (81 y.o. Female) Treating RN: Afful, RN, BSN, Velva Harman Primary Care Physician: Park Liter Other Clinician: Referring Physician: Park Liter Treating Physician/Extender: BURNS III, Charlean Sanfilippo in Treatment: 16 Education Assessment Education Provided To: Patient Education Topics Provided Welcome To [place]: Methods: Explain/Verbal Responses: State content correctly Electronic Signature(s) Signed: 01/03/2015 10:47:43 AM By: Regan Lemming BSN, RN Entered By: Regan Lemming on 01/03/2015 10:47:43 [name] ([id number]) -------------------------------------------------------------------------------- Wound Assessment Details Patient Name: [name]. Date of Service: 01/03/2015 10:00 AM Medical Record Number: [id number] Patient Account Number: [id number] Date of Birth/Sex: 1923/01/01 (81 y.o. Female) Treating RN: Afful, RN, BSN, Reader Primary Care Physician: Park Liter Other Clinician: Referring Physician: Park Liter Treating Physician/Extender: BURNS III, WALTER Weeks in Treatment: 16 Wound Status Wound Number: 3 Primary Pressure Ulcer Etiology: Wound Location: Left Foot - Dorsal, Medial Wound  Open Wounding Event: Pressure Injury Status: Date Acquired: 11/12/2014 Comorbid Cataracts, Arrhythmia, Hypertension, Weeks Of Treatment: 6 History: Peripheral Arterial Disease, Peripheral Clustered Wound: No Venous Disease, Osteoarthritis, Confinement Anxiety Photos Photo Uploaded By: Regan Lemming on 01/03/2015 16:49:21 Wound Measurements Length: (cm) 0.4 Width: (cm) 0.7 Depth: (cm) 0.2 Area: (cm) 0.22 Volume: (cm) 0.044 % Reduction in Area: 86% % Reduction in Volume: 72% Epithelialization: None Tunneling: No Undermining: No Wound Description Classification: Category/Stage III Wound Margin: Flat and Intact Exudate Amount: Medium Exudate Type: Serous Exudate Color: amber Foul Odor After Cleansing: No Wound Bed Granulation Amount: Medium (34-66%) Exposed Structure Granulation Quality: Pink Fascia Exposed: No Necrotic Amount: Medium (34-66%) Fat Layer Exposed: No [place], Shanie L. ([id number]) Necrotic Quality: Eschar, Adherent Slough Tendon Exposed: No Muscle Exposed: No Joint Exposed: No Bone Exposed: No Limited to Skin Breakdown Periwound Skin Texture Texture Color No Abnormalities Noted: No No Abnormalities Noted: No Callus: No Atrophie Blanche: No Crepitus: No Cyanosis: No Excoriation: No Ecchymosis: No Fluctuance: No Erythema: No Friable: No Hemosiderin Staining: No Induration: No Mottled: No Localized Edema: No Pallor: No Rash: No Rubor: No Scarring: No Temperature / Pain Moisture Temperature: No Abnormality No Abnormalities Noted: No Dry / Scaly: No Maceration: No Moist: Yes Wound Preparation Ulcer Cleansing: Other: soap and water, Topical Anesthetic Applied: Other: lidocaine 4%, Treatment Notes Wound #3 (Left, Medial, Dorsal Foot) 1. Cleansed with: Cleanse wound with antibacterial soap and water 2. Anesthetic Topical Lidocaine 4% cream to wound bed prior to debridement 4. Dressing Applied: Aquacel Ag 5. Secondary Dressing  Applied ABD Pad 7. Secured with 3 Layer Compression System - Bilateral Electronic Signature(s) Signed: 01/03/2015 10:23:05 AM By: Regan Lemming BSN, RN Entered By: Regan Lemming on 01/03/2015 10:23:05 [name] ([id number]) -------------------------------------------------------------------------------- Wound Assessment Details Patient Name: [name]. Date of Service: 01/03/2015 10:00 AM Medical Record Number: [id number] Patient Account Number: [id number] Date of Birth/Sex: 04/04/1922 (81 y.o. Female) Treating RN: Afful, RN, BSN, Velva Harman Primary Care Physician: Park Liter Other Clinician: Referring Physician:  Wynetta Emery, MEGAN Treating Physician/Extender: BURNS III, WALTER Weeks in Treatment: 16 Wound Status Wound Number: 4 Primary Pressure Ulcer Etiology: Wound Location: Left Foot - Dorsal, Lateral Wound Open Wounding Event: Shear/Friction Status: Date Acquired: 11/12/2014 Comorbid Cataracts, Arrhythmia, Hypertension, Weeks Of Treatment: 6 History: Peripheral Arterial Disease, Peripheral Clustered Wound: No Venous Disease, Osteoarthritis, Confinement Anxiety Photos Photo Uploaded By: Regan Lemming on 01/03/2015 16:49:21 Wound Measurements Length: (cm) 0.8 Width: (cm) 0.5 Depth: (cm) 0.2 Area: (cm) 0.314 Volume: (cm) 0.063 % Reduction in Area: 71.5% % Reduction in Volume: 42.7% Epithelialization: None Tunneling: No Undermining: No Wound Description Classification: Category/Stage III Wound Margin: Flat and Intact Exudate Amount: Medium Exudate Type: Serous Exudate Color: amber Foul Odor After Cleansing: No Wound Bed Granulation Amount: Medium (34-66%) Exposed Structure Granulation Quality: Pink Fascia Exposed: No Necrotic Amount: Medium (34-66%) Fat Layer Exposed: No Brosnahan, Dacia L. ([id number]) Necrotic Quality: Adherent Slough Tendon Exposed: No Muscle Exposed: No Joint Exposed: No Bone Exposed: No Limited to Skin Breakdown Periwound Skin  Texture Texture Color No Abnormalities Noted: No No Abnormalities Noted: No Callus: No Atrophie Blanche: No Crepitus: No Cyanosis: No Excoriation: No Ecchymosis: No Fluctuance: No Erythema: No Friable: No Hemosiderin Staining: No Induration: No Mottled: No Localized Edema: Yes Pallor: No Rash: No Rubor: No Scarring: No Temperature / Pain Moisture Temperature: No Abnormality No Abnormalities Noted: No Dry / Scaly: No Maceration: No Moist: Yes Wound Preparation Ulcer Cleansing: Other: soap and water, Topical Anesthetic Applied: Other: lidocaine 4%, Treatment Notes Wound #4 (Left, Lateral, Dorsal Foot) 1. Cleansed with: Cleanse wound with antibacterial soap and water 2. Anesthetic Topical Lidocaine 4% cream to wound bed prior to debridement 4. Dressing Applied: Aquacel Ag 5. Secondary Dressing Applied ABD Pad 7. Secured with 3 Layer Compression System - Bilateral Electronic Signature(s) Signed: 01/03/2015 10:23:35 AM By: Regan Lemming BSN, RN Entered By: Regan Lemming on 01/03/2015 10:23:35 [name] ([id number]) -------------------------------------------------------------------------------- Vitals Details Patient Name: [name] Date of Service: 01/03/2015 10:00 AM Medical Record Number: [id number] Patient Account Number: [id number] Date of Birth/Sex: [birth year] (81 y.o. Female) Treating RN: Afful, RN, BSN, Grant Primary Care Physician: Park Liter Other Clinician: Referring Physician: Park Liter Treating Physician/Extender: BURNS III, WALTER Weeks in Treatment: 16 Vital Signs Time Taken: 10:08 Temperature (F): 97.8 Height (in): 67 Pulse (bpm): 68 Weight (lbs): 159 Respiratory Rate (breaths/min): 16 Body Mass Index (BMI): 24.9 Blood Pressure (mmHg): 118/61 Reference Range: 80 - 120 mg / dl Electronic Signature(s) Signed: 01/03/2015 10:17:21 AM By: Regan Lemming BSN, RN Previous Signature: 01/03/2015 10:09:05 AM Version By: Regan Lemming BSN, RN Entered By: Regan Lemming on 01/03/2015 10:17:21

## 2015-01-08 ENCOUNTER — Telehealth: Payer: Self-pay | Admitting: Family Medicine

## 2015-01-08 NOTE — Telephone Encounter (Signed)
We stopped her oxycodone. Please let me know if she's having more pain. She told me she wasn't taking it.

## 2015-01-08 NOTE — Telephone Encounter (Signed)
Left voicemail to notify patient that this medication was stopped, told them to let us know if this is still needed.

## 2015-01-08 NOTE — Telephone Encounter (Signed)
Pt needs a refill on oxycodone.

## 2015-01-08 NOTE — Telephone Encounter (Signed)
Forward to provider

## 2015-01-09 ENCOUNTER — Other Ambulatory Visit: Payer: Self-pay | Admitting: Family Medicine

## 2015-01-09 MED ORDER — HYDROCODONE-ACETAMINOPHEN 5-325 MG PO TABS: 1.0000 | ORAL_TABLET | Freq: Four times a day (QID) | ORAL | Status: DC | PRN

## 2015-01-09 NOTE — Telephone Encounter (Signed)
Patient of Dr. Durenda Age.

## 2015-01-09 NOTE — Telephone Encounter (Signed)
Pt daughter says she was trying to only take tylenol but she's in too much pain. From he waist down she is in pain all the time since amedysis has started to wrap her leg on a weekly basis.

## 2015-01-09 NOTE — Telephone Encounter (Signed)
rx printed

## 2015-01-10 NOTE — Progress Notes (Signed)
RAJENE, QUINTER ([id number]) Visit Report for 01/03/2015 Chief Complaint Document Details Patient Name: [name], [name] 01/03/2015 10:00 Date of Service: AM Medical Record [id number] Number: Patient Account Number: [id number] [birth year] (81 y.o. Treating RN: Baruch Gouty, RN, BSN, Velva Harman Date of Birth/Sex: Female) Other Clinician: Primary Care Physician: Wynetta Emery, MEGAN Treating Aubery Douthat Referring Physician: Park Liter Physician/Extender: Suella Grove in Treatment: 16 Information Obtained from: Patient Chief Complaint Patient presents to the wound care center today with an open arterial ulcer. The patient has had left lower extremity ulceration for about 1 month. Electronic Signature(s) Signed: 01/04/2015 9:05:41 AM By: Christin Fudge MD, FACS Entered By: Christin Fudge on 01/04/2015 09:05:41 [name] ([id number]) -------------------------------------------------------------------------------- HPI Details Patient Name: [name], [name] 01/03/2015 10:00 Date of Service: AM Medical Record [id number] Number: Patient Account Number: [id number] [birth year] (81 y.o. Treating RN: Baruch Gouty, RN, BSN, Velva Harman Date of Birth/Sex: Female) Other Clinician: Primary Care Physician: Wynetta Emery, MEGAN Treating Jt Brabec Referring Physician: Park Liter Physician/Extender: Weeks in Treatment: 16 History of Present Illness Location: left lower extremity ulceration Quality: Patient reports experiencing burning to affected area(s). Severity: Patient states wound are getting worse. Duration: Patient has had the wound for < 4 weeks prior to presenting for treatment Timing: Pain in wound is constant (hurts all the time) Context: The wound appeared gradually over time Modifying Factors: Other treatment(s) tried include:has been on oral anti-buttocks since the beginning of the month and had 10 days of Augmentin and now is on doxycycline. Associated Signs and Symptoms: Patient reports having  difficulty standing for long periods. HPI Description: 81 year old patient was admitted to [facility] between 08/13/2014 and 08/17/2014 with cellulitis of the left lower extremity.duplex study done then showed no DVT and the patient was on IV vancomycin and Rocephin and also received Unasyn later. The patient was sent home on 10 days of Augmentin. Her other comorbidities of atrial fibrillation, essential hypertension, diabetes mellitus type 2, coronary artery disease were managed with appropriate medications. on 09/04/2014 she was seen in the ER and was found to have a 10 cm ulceration on the left lower extremity lateral and superior to the lateral malleolus. The granulation tissue was noted and she was referred to the wound center. the ER notes that her ABIs were within normal limits and she was sent home on consultative management. a duplex scan of the lower extremities was done by the nursing home and this showed that the right ABI was 0.96 with a multiphasic waveform throughout the right leg. There was a monophasic waveform of the left leg suggesting left external iliac artery stenosis with blunting distally and the ABI could not be calculated on the left side. Moderate to severe ischemia was suspected of the left leg and a MR angiogram was recommended. 09/17/2014 -- Xray Left tibia and fibula --- unremarkable tibia and fibula Her wound culture is back and this has grown a heavy growth of staph aureus sensitive to ciprofloxacin and clindamycin and Bactrim. She has been on doxycycline since July 14 for 10 days and we will review her clinically before switching her antibiotics if need be. After reviewing her chart she is not going to be here till 09/17/2014 and hence I will call in Bactrim DS 1 twice a day for 14 days. She has seen Dr. Lucky Cowboy for an opinion and she is scheduled a procedure on this coming Thursday. other than that the pain and swelling continues and she has  no other acute problems. 09/25/2014 -- she had a recent procedure  done by Dr. Lucky Cowboy for diffuse SFA stenosis, popliteal occlusion and tibial disease. she had at angioplasty of the left peroneal artery and tibioperoneal trunk, left superficial Fiorentino, Bryla L. ([id number]) femoral artery, left popliteal artery, distal SFA and popliteal artery. the patient is experiencing some swelling of the left lower extremity after the procedure. 10/09/2014 -- she has an appointment to see her vascular surgeon again this weekend for possibly a vascular Doppler. No other issues. 10/23/2014 -- The patient was recently seen by Dr. Lucky Cowboy on this past Friday. We do not have his reports but we do know that he was pleased with her progress and did not plan any further procedure in the near future. 10/30/2014 - we have received Dr. Bunnie Domino evaluation and he has mentioned that the duplex done showed a stenosis versus a lesion in the distal popliteal artery and tibioperoneal trunk in an area below her previous intervention and the overall flow was significantly better and the stent itself was patent. Her perfusion has improved but it was not optimized. Since her wound was improving he did not recommend any angiogram at this point. However if her wound worsens or infection develops he would recommend an angiogram and further evaluation to improve her perfusion. He is going to see her back in 6-8 weeks. 11/20/2014 -- she was not here last week because of her illness and is here to see as after 2 weeks. Her daughter has checked with her insurance company and she will have no out-of-pocket expense for her skin substitute. 12/04/2014 -- she has her skin substitute available but due to the nature of her wounds she will not use it today. 12/20/2014 -- she was at the vascular office yesterday and I believe a workup was done and the family member tells a that no surgical procedure was recommended. We'll await the official  reports. 11/17/ 2016 -- they have not been able to apply the compression stockings on the right lower extremity but the left one is looking very good. No fresh issues. Electronic Signature(s) Signed: 01/04/2015 10:18:59 AM By: Christin Fudge MD, FACS Previous Signature: 01/04/2015 9:06:25 AM Version By: Christin Fudge MD, FACS Entered By: Christin Fudge on 01/04/2015 10:18:59 [name] ([id number]) -------------------------------------------------------------------------------- Physical Exam Details Patient Name: [name], [name] 01/03/2015 10:00 Date of Service: AM Medical Record [id number] Number: Patient Account Number: [id number] [birth year] (81 y.o. Treating RN: Baruch Gouty, RN, BSN, Velva Harman Date of Birth/Sex: Female) Other Clinician: Primary Care Physician: Wynetta Emery, MEGAN Treating Ivy Puryear Referring Physician: Park Liter Physician/Extender: Weeks in Treatment: 16 Constitutional . Pulse regular. Respirations normal and unlabored. Afebrile. . Eyes Nonicteric. Reactive to light. Ears, Nose, Mouth, and Throat Lips, teeth, and gums WNL.Marland Kitchen Moist mucosa without lesions . Neck supple and nontender. No palpable supraclavicular or cervical adenopathy. Normal sized without goiter. Respiratory WNL. No retractions.. Cardiovascular Pedal Pulses WNL. No clubbing, cyanosis or edema. Lymphatic No adneopathy. No adenopathy. No adenopathy. Musculoskeletal Adexa without tenderness or enlargement.. Digits and nails w/o clubbing, cyanosis, infection, petechiae, ischemia, or inflammatory conditions.. Integumentary (Hair, Skin) No suspicious lesions. No crepitus or fluctuance. No peri-wound warmth or erythema. No masses.Marland Kitchen Psychiatric Judgement and insight Intact.. No evidence of depression, anxiety, or agitation.. Notes the edema on the left lower extremity is really down and the wound on the dorsum of the left foot has minimal slough and we will continue with local care. Electronic  Signature(s) Signed: 01/04/2015 10:19:33 AM By: Christin Fudge MD, FACS Entered By: Christin Fudge on 01/04/2015 10:19:32 [name] ([id number]) --------------------------------------------------------------------------------  Physician Orders Details Patient Name: ARLEN, BAKARE 01/03/2015 10:00 Date of Service: AM Medical Record [id number] Number: Patient Account Number: [id number] 05/19/1922 (81 y.o. Treating RN: Baruch Gouty, RN, BSN, Velva Harman Date of Birth/Sex: Female) Other Clinician: Primary Care Physician: Wynetta Emery, MEGAN Treating BURNS III, WALTER Referring Physician: Park Liter Physician/Extender: Suella Grove in Treatment: 31 Verbal / Phone Orders: Yes Clinician: Afful, RN, BSN, Rita Read Back and Verified: Yes Diagnosis Coding Wound Cleansing Wound #3 Left,Medial,Dorsal Foot o May Shower, gently pat wound dry prior to applying new dressing. o May shower with protection. o No tub bath. Wound #4 Left,Lateral,Dorsal Foot o May Shower, gently pat wound dry prior to applying new dressing. o May shower with protection. o No tub bath. Anesthetic Wound #3 Left,Medial,Dorsal Foot o Topical Lidocaine 4% cream applied to wound bed prior to debridement Wound #4 Left,Lateral,Dorsal Foot o Topical Lidocaine 4% cream applied to wound bed prior to debridement Primary Wound Dressing Wound #3 Left,Medial,Dorsal Foot o Aquacel Ag Wound #4 Left,Lateral,Dorsal Foot o Aquacel Ag Secondary Dressing Wound #3 Left,Medial,Dorsal Foot o ABD pad Wound #4 Left,Lateral,Dorsal Foot o ABD pad Dressing Change Frequency Wound #3 Left,Medial,Dorsal Foot Giaimo, Evoleth L. ([id number]) o Change dressing every week - Home health to change wrap Wound #4 Left,Lateral,Dorsal Foot o Change dressing every week - Home Heath to change wrap Follow-up Appointments Wound #3 Left,Medial,Dorsal Foot o Return Appointment in 2 weeks. Wound #4 Left,Lateral,Dorsal Foot o Return  Appointment in 2 weeks. Edema Control Wound #3 Left,Medial,Dorsal Foot o 3 Layer Compression System - Bilateral - Profore lite used. o Elevate legs to the level of the heart and pump ankles as often as possible o Other: - Monitor toes for discoloration;blue/purple Wound #4 Left,Lateral,Dorsal Foot o 3 Layer Compression System - Bilateral - Profore lite used. o Elevate legs to the level of the heart and pump ankles as often as possible o Other: - Monitor toes for discoloration;blue/purple Home Health Wound #3 [place] Visits - [place] Nurse may visit PRN to address patientos wound care needs. o FACE TO FACE ENCOUNTER: MEDICARE and MEDICAID PATIENTS: I certify that this patient is under my care and that I had a face-to-face encounter that meets the physician face-to-face encounter requirements with this patient on this date. The encounter with the patient was in whole or in part for the following MEDICAL CONDITION: (primary reason for [place]) MEDICAL NECESSITY: I certify, that based on my findings, NURSING services are a medically necessary home health service. HOME BOUND STATUS: I certify that my clinical findings support that this patient is homebound (i.e., Due to illness or injury, pt requires aid of supportive devices such as crutches, cane, wheelchairs, walkers, the use of special transportation or the assistance of another person to leave their place of residence. There is a normal inability to leave the home and doing so requires considerable and taxing effort. Other absences are for medical reasons / religious services and are infrequent or of short duration when for other reasons). o If current dressing causes regression in wound condition, may D/C ordered dressing product/s and apply Normal Saline Moist Dressing daily until next [place] / Other MD appointment. [place] of regression in wound condition at [phone]. o Please direct any NON-WOUND related issues/requests for orders to patient's Primary Care Physician Wound #4 [place] Visits - [place] Nurse may visit PRN to address patientos wound care needs. Levander Campion  L. ([id number]) o FACE TO FACE ENCOUNTER: MEDICARE and MEDICAID PATIENTS: I certify that this patient is under my care and that I had a face-to-face encounter that meets the physician face-to-face encounter requirements with this patient on this date. The encounter with the patient was in whole or in part for the following MEDICAL CONDITION: (primary reason for [place]) MEDICAL NECESSITY: I certify, that based on my findings, NURSING services are a medically necessary home health service. HOME BOUND STATUS: I certify that my clinical findings support that this patient is homebound (i.e., Due to illness or injury, pt requires aid of supportive devices such as crutches, cane, wheelchairs, walkers, the use of special transportation or the assistance of another person to leave their place of residence. There is a normal inability to leave the home and doing so requires considerable and taxing effort. Other absences are for medical reasons / religious services and are infrequent or of short duration when for other reasons). o If current dressing causes regression in wound condition, may D/C ordered dressing product/s and apply Normal Saline Moist Dressing daily until next [place] / Other MD appointment. [place] of regression in wound condition at [phone]. o Please direct any NON-WOUND related issues/requests for orders to patient's Primary Care Physician Electronic Signature(s) Signed: 01/03/2015 10:31:12 AM By: Regan Lemming BSN, RN Signed: 01/09/2015 3:24:44 PM By: Loletha Grayer MD Entered By: Regan Lemming on 01/03/2015  10:31:12 [name] ([id number]) -------------------------------------------------------------------------------- Problem List Details Patient Name: [name], [name] 01/03/2015 10:00 Date of Service: AM Medical Record [id number] Number: Patient Account Number: [id number] 04-Jan-1923 (81 y.o. Treating RN: Baruch Gouty, RN, BSN, Velva Harman Date of Birth/Sex: Female) Other Clinician: Primary Care Physician: Park Liter Treating Jsiah Menta Referring Physician: Park Liter Physician/Extender: Suella Grove in Treatment: 16 Active Problems ICD-10 Encounter Code Description Active Date Diagnosis I70.248 Atherosclerosis of native arteries of left leg with ulceration 09/07/2014 Yes of other part of lower left leg L97.222 Non-pressure chronic ulcer of left calf with fat layer 09/07/2014 Yes exposed L03.116 Cellulitis of left lower limb 09/07/2014 Yes Inactive Problems Resolved Problems Electronic Signature(s) Signed: 01/04/2015 9:05:35 AM By: Christin Fudge MD, FACS Entered By: Christin Fudge on 01/04/2015 09:05:35 [name] ([id number]) -------------------------------------------------------------------------------- Progress Note Details Patient Name: [name] 01/03/2015 10:00 Date of Service: AM Medical Record [id number] Number: Patient Account Number: [id number] [birth year] (81 y.o. Treating RN: Baruch Gouty, RN, BSN, Velva Harman Date of Birth/Sex: Female) Other Clinician: Primary Care Physician: Wynetta Emery, MEGAN Treating Judithe Keetch Referring Physician: Park Liter Physician/Extender: Suella Grove in Treatment: 16 Subjective Chief Complaint Information obtained from Patient Patient presents to the wound care center today with an open arterial ulcer. The patient has had left lower extremity ulceration for about 1 month. History of Present Illness (HPI) The following HPI elements were documented for the patient's wound: Location: left lower extremity ulceration Quality: Patient  reports experiencing burning to affected area(s). Severity: Patient states wound are getting worse. Duration: Patient has had the wound for < 4 weeks prior to presenting for treatment Timing: Pain in wound is constant (hurts all the time) Context: The wound appeared gradually over time Modifying Factors: Other treatment(s) tried include:has been on oral anti-buttocks since the beginning of the month and had 10 days of Augmentin and now is on doxycycline. Associated Signs and Symptoms: Patient reports having difficulty standing for long periods. 81 year old patient was admitted to [facility] between 08/13/2014 and 08/17/2014 with cellulitis of the left lower extremity.duplex study done then  showed no DVT and the patient was on IV vancomycin and Rocephin and also received Unasyn later. The patient was sent home on 10 days of Augmentin. Her other comorbidities of atrial fibrillation, essential hypertension, diabetes mellitus type 2, coronary artery disease were managed with appropriate medications. on 09/04/2014 she was seen in the ER and was found to have a 10 cm ulceration on the left lower extremity lateral and superior to the lateral malleolus. The granulation tissue was noted and she was referred to the wound center. the ER notes that her ABIs were within normal limits and she was sent home on consultative management. a duplex scan of the lower extremities was done by the nursing home and this showed that the right ABI was 0.96 with a multiphasic waveform throughout the right leg. There was a monophasic waveform of the left leg suggesting left external iliac artery stenosis with blunting distally and the ABI could not be calculated on the left side. Moderate to severe ischemia was suspected of the left leg and a MR angiogram was recommended. 09/17/2014 -- Xray Left tibia and fibula --- unremarkable tibia and fibula Her wound culture is back and this has grown a heavy  growth of staph aureus sensitive to ciprofloxacin and clindamycin and Bactrim. WYNTER, PRESCHER ([id number]) She has been on doxycycline since July 14 for 10 days and we will review her clinically before switching her antibiotics if need be. After reviewing her chart she is not going to be here till 09/17/2014 and hence I will call in Bactrim DS 1 twice a day for 14 days. She has seen Dr. Lucky Cowboy for an opinion and she is scheduled a procedure on this coming Thursday. other than that the pain and swelling continues and she has no other acute problems. 09/25/2014 -- she had a recent procedure done by Dr. Lucky Cowboy for diffuse SFA stenosis, popliteal occlusion and tibial disease. she had at angioplasty of the left peroneal artery and tibioperoneal trunk, left superficial femoral artery, left popliteal artery, distal SFA and popliteal artery. the patient is experiencing some swelling of the left lower extremity after the procedure. 10/09/2014 -- she has an appointment to see her vascular surgeon again this weekend for possibly a vascular Doppler. No other issues. 10/23/2014 -- The patient was recently seen by Dr. Lucky Cowboy on this past Friday. We do not have his reports but we do know that he was pleased with her progress and did not plan any further procedure in the near future. 10/30/2014 - we have received Dr. Bunnie Domino evaluation and he has mentioned that the duplex done showed a stenosis versus a lesion in the distal popliteal artery and tibioperoneal trunk in an area below her previous intervention and the overall flow was significantly better and the stent itself was patent. Her perfusion has improved but it was not optimized. Since her wound was improving he did not recommend any angiogram at this point. However if her wound worsens or infection develops he would recommend an angiogram and further evaluation to improve her perfusion. He is going to see her back in 6-8 weeks. 11/20/2014 -- she was not here  last week because of her illness and is here to see as after 2 weeks. Her daughter has checked with her insurance company and she will have no out-of-pocket expense for her skin substitute. 12/04/2014 -- she has her skin substitute available but due to the nature of her wounds she will not use it today. 12/20/2014 -- she was at the  vascular office yesterday and I believe a workup was done and the family member tells a that no surgical procedure was recommended. We'll await the official reports. 11/17/ 2016 -- they have not been able to apply the compression stockings on the right lower extremity but the left one is looking very good. No fresh issues. Objective Constitutional Pulse regular. Respirations normal and unlabored. Afebrile. Vitals Time Taken: 10:08 AM, Height: 67 in, Weight: 159 lbs, BMI: 24.9, Temperature: 97.8 F, Pulse: 68 bpm, Respiratory Rate: 16 breaths/min, Blood Pressure: 118/61 mmHg. Eyes Nonicteric. Reactive to light. [name], Kara L. ([id number]) Ears, Nose, Mouth, and Throat Lips, teeth, and gums WNL.Marland Kitchen Moist mucosa without lesions . Neck supple and nontender. No palpable supraclavicular or cervical adenopathy. Normal sized without goiter. Respiratory WNL. No retractions.. Cardiovascular Pedal Pulses WNL. No clubbing, cyanosis or edema. Lymphatic No adneopathy. No adenopathy. No adenopathy. Musculoskeletal Adexa without tenderness or enlargement.. Digits and nails w/o clubbing, cyanosis, infection, petechiae, ischemia, or inflammatory conditions.Marland Kitchen Psychiatric Judgement and insight Intact.. No evidence of depression, anxiety, or agitation.. General Notes: the edema on the left lower extremity is really down and the wound on the dorsum of the left foot has minimal slough and we will continue with local care. Integumentary (Hair, Skin) No suspicious lesions. No crepitus or fluctuance. No peri-wound warmth or erythema. No masses.. Wound #3 status is Open.  Original cause of wound was Pressure Injury. The wound is located on the Left,Medial,Dorsal Foot. The wound measures 0.4cm length x 0.7cm width x 0.2cm depth; 0.22cm^2 area and 0.044cm^3 volume. The wound is limited to skin breakdown. There is no tunneling or undermining noted. There is a medium amount of serous drainage noted. The wound margin is flat and intact. There is medium (34-66%) pink granulation within the wound bed. There is a medium (34-66%) amount of necrotic tissue within the wound bed including Eschar and Adherent Slough. The periwound skin appearance exhibited: Moist. The periwound skin appearance did not exhibit: Callus, Crepitus, Excoriation, Fluctuance, Friable, Induration, Localized Edema, Rash, Scarring, Dry/Scaly, Maceration, Atrophie Blanche, Cyanosis, Ecchymosis, Hemosiderin Staining, Mottled, Pallor, Rubor, Erythema. Periwound temperature was noted as No Abnormality. Wound #4 status is Open. Original cause of wound was Shear/Friction. The wound is located on the Left,Lateral,Dorsal Foot. The wound measures 0.8cm length x 0.5cm width x 0.2cm depth; 0.314cm^2 area and 0.063cm^3 volume. The wound is limited to skin breakdown. There is no tunneling or undermining noted. There is a medium amount of serous drainage noted. The wound margin is flat and intact. There is medium (34-66%) pink granulation within the wound bed. There is a medium (34-66%) amount of necrotic tissue within the wound bed including Adherent Slough. The periwound skin appearance exhibited: Localized Edema, Moist. The periwound skin appearance did not exhibit: Callus, Crepitus, Excoriation, Fluctuance, Friable, Induration, Rash, Scarring, Dry/Scaly, Maceration, Atrophie Blanche, Cyanosis, Ecchymosis, Hemosiderin Staining, Mottled, Pallor, Rubor, Erythema. Periwound temperature was noted as Kok, Reka L. ([id number]) No Abnormality. Assessment Active Problems ICD-10 I70.248 - Atherosclerosis of native  arteries of left leg with ulceration of other part of lower left leg L97.222 - Non-pressure chronic ulcer of left calf with fat layer exposed L03.116 - Cellulitis of left lower limb Plan Wound Cleansing: Wound #3 Left,Medial,Dorsal Foot: May Shower, gently pat wound dry prior to applying new dressing. May shower with protection. No tub bath. Wound #4 Left,Lateral,Dorsal Foot: May Shower, gently pat wound dry prior to applying new dressing. May shower with protection. No tub bath. Anesthetic: Wound #3 Left,Medial,Dorsal Foot: Topical Lidocaine 4%  cream applied to wound bed prior to debridement Wound #4 Left,Lateral,Dorsal Foot: Topical Lidocaine 4% cream applied to wound bed prior to debridement Primary Wound Dressing: Wound #3 Left,Medial,Dorsal Foot: Aquacel Ag Wound #4 Left,Lateral,Dorsal Foot: Aquacel Ag Secondary Dressing: Wound #3 Left,Medial,Dorsal Foot: ABD pad Wound #4 Left,Lateral,Dorsal Foot: ABD pad Dressing Change Frequency: Wound #3 Left,Medial,Dorsal Foot: Change dressing every week - Home health to change wrap Wound #4 Left,Lateral,Dorsal Foot: Change dressing every week - Home Heath to change wrap [place], [place] ([id number]) Follow-up Appointments: Wound #3 Left,Medial,Dorsal Foot: Return Appointment in 2 weeks. Wound #4 Left,Lateral,Dorsal Foot: Return Appointment in 2 weeks. Edema Control: Wound #3 Left,Medial,Dorsal Foot: 3 Layer Compression System - Bilateral - Profore lite used. Elevate legs to the level of the heart and pump ankles as often as possible Other: - Monitor toes for discoloration;blue/purple Wound #4 Left,Lateral,Dorsal Foot: 3 Layer Compression System - Bilateral - Profore lite used. Elevate legs to the level of the heart and pump ankles as often as possible Other: - Monitor toes for discoloration;blue/purple Home Health: Wound #3 Left,Medial,Dorsal Foot: Continue Home Health Visits - [place] Nurse may visit PRN to  address patient s wound care needs. FACE TO FACE ENCOUNTER: MEDICARE and MEDICAID PATIENTS: I certify that this patient is under my care and that I had a face-to-face encounter that meets the physician face-to-face encounter requirements with this patient on this date. The encounter with the patient was in whole or in part for the following MEDICAL CONDITION: (primary reason for [place]) MEDICAL NECESSITY: I certify, that based on my findings, NURSING services are a medically necessary home health service. HOME BOUND STATUS: I certify that my clinical findings support that this patient is homebound (i.e., Due to illness or injury, pt requires aid of supportive devices such as crutches, cane, wheelchairs, walkers, the use of special transportation or the assistance of another person to leave their place of residence. There is a normal inability to leave the home and doing so requires considerable and taxing effort. Other absences are for medical reasons / religious services and are infrequent or of short duration when for other reasons). If current dressing causes regression in wound condition, may D/C ordered dressing product/s and apply Normal Saline Moist Dressing daily until next [place] / Other MD appointment. [place] of regression in wound condition at [phone]. Please direct any NON-WOUND related issues/requests for orders to patient's Primary Care Physician Wound #4 Left,Lateral,Dorsal Foot: [place] Visits - [place] Nurse may visit PRN to address patient s wound care needs. FACE TO FACE ENCOUNTER: MEDICARE and MEDICAID PATIENTS: I certify that this patient is under my care and that I had a face-to-face encounter that meets the physician face-to-face encounter requirements with this patient on this date. The encounter with the patient was in whole or in part for the following MEDICAL CONDITION: (primary reason for [place]) MEDICAL NECESSITY: I certify, that based on my findings, NURSING services are a medically necessary home health service. HOME BOUND STATUS: I certify that my clinical findings support that this patient is homebound (i.e., Due to illness or injury, pt requires aid of supportive devices such as crutches, cane, wheelchairs, walkers, the use of special transportation or the assistance of another person to leave their place of residence. There is a normal inability to leave the home and doing so requires considerable and taxing effort. Other absences are for medical reasons / religious services and are infrequent  or of short duration when for other reasons). If current dressing causes regression in wound condition, may D/C ordered dressing product/s and apply Normal Saline Moist Dressing daily until next [place] / Other MD appointment. [place] of regression in wound condition at [phone]. Please direct any NON-WOUND related issues/requests for orders to patient's Primary Care Physician [name], [name] ([id number]) I have recommended calcium alginate with silver to the left foot with a 3 layer compression wrap. I have encouraged her to elevate her limbs and use her compression stockings regularly. See her back next week. Electronic Signature(s) Signed: 01/04/2015 10:21:05 AM By: Christin Fudge MD, FACS Entered By: Christin Fudge on 01/04/2015 10:21:05 [name] ([id number]) -------------------------------------------------------------------------------- SuperBill Details Patient Name: [name] Date of Service: 01/03/2015 Medical Record Patient Account Number: [id number] Number: [place], RN, BSN, Treating RN: [birth year] (81 y.o. Velva Harman Date of Birth/Sex: Female) Other Clinician: Primary Care Physician: Wynetta Emery, MEGAN Treating Evone Arseneau Referring Physician: [place] Physician/Extender: [place] in Treatment:  16 Diagnosis Coding ICD-10 Codes Code Description I70.248 Atherosclerosis of native arteries of left leg with ulceration of other part of lower left leg L97.222 Non-pressure chronic ulcer of left calf with fat layer exposed L03.116 Cellulitis of left lower limb Facility Procedures CPT4: Description Modifier Quantity Code [id number] BILATERAL: Application of multi-layer venous compression 1 system; leg (below knee), including ankle and foot. Physician Procedures CPT4: Description Modifier Quantity Code [id number] - WC PHYS LEVEL 3 - EST PT 1 ICD-10 Description Diagnosis I70.248 Atherosclerosis of native arteries of left leg with ulceration of other part of lower left leg L97.222 Non-pressure chronic ulcer of  left calf with fat layer exposed L03.116 Cellulitis of left lower limb Electronic Signature(s) Signed: 01/09/2015 4:07:58 PM By: Christin Fudge MD, FACS Previous Signature: 01/04/2015 10:21:42 AM Version By: Christin Fudge MD, FACS Previous Signature: 01/03/2015 10:46:12 AM Version By: Regan Lemming BSN, RN Entered By: Kristine Royal on 01/09/2015 11:03:37

## 2015-01-17 ENCOUNTER — Ambulatory Visit: Payer: Medicare Other | Admitting: Surgery

## 2015-01-23 ENCOUNTER — Telehealth: Payer: Self-pay

## 2015-01-23 NOTE — Telephone Encounter (Signed)
Patient's daughter "Jacqlyn Larsen" called and would like to discuss her concerns regarding her mother not going to PT since 12/24/14. Please call to obtain all of the details.  Jacqlyn Larsen can be reached at [phone].

## 2015-01-24 ENCOUNTER — Ambulatory Visit: Payer: Medicare Other | Admitting: Surgery

## 2015-01-28 NOTE — Telephone Encounter (Signed)
If her oxygen level was 71, she needs to be seen to make sure she doesn't have pneumonia. If no one in our office can see her, she should go to the ER. She said last visit that the pain medicine didn't help her, and with her having trouble staying awake, that's not a good medicine for her. She can keep using the lidoderm and they tylenol unless something has changed.

## 2015-01-28 NOTE — Telephone Encounter (Signed)
Patients daughter states that the pain is in her legs now, possible from where they are being wrapped. She is unsure how the patch will help her since its on her not her hip. She is also concerned that she has a lot of fluid at the top of her legs.

## 2015-01-28 NOTE — Telephone Encounter (Signed)
Can we please get her in to see [place]? I think she needs an appointment.

## 2015-01-28 NOTE — Telephone Encounter (Signed)
Called patients daughter, she states that the patients oxygen level was at 71. The nurse from [place] is there now, it is up to 91% now.  She is concerned that she cant stay awake, please advise. She also needs a refill on her pain medication.

## 2015-01-28 NOTE — Telephone Encounter (Addendum)
pts daughter would like a call back as soon as possible regarding her moms wound care and oxygen. Oxygen level was 71 Saturday.

## 2015-01-29 NOTE — Telephone Encounter (Signed)
Will you please get this patient scheduled.

## 2015-01-30 ENCOUNTER — Ambulatory Visit: Payer: Medicare Other | Admitting: Family Medicine

## 2015-01-31 ENCOUNTER — Telehealth: Payer: Self-pay | Admitting: Family Medicine

## 2015-01-31 ENCOUNTER — Encounter: Payer: Medicare Other | Attending: Surgery | Admitting: Surgery

## 2015-01-31 DIAGNOSIS — I4891 Unspecified atrial fibrillation: Secondary | ICD-10-CM | POA: Insufficient documentation

## 2015-01-31 DIAGNOSIS — I10 Essential (primary) hypertension: Secondary | ICD-10-CM | POA: Insufficient documentation

## 2015-01-31 DIAGNOSIS — L03116 Cellulitis of left lower limb: Secondary | ICD-10-CM | POA: Insufficient documentation

## 2015-01-31 DIAGNOSIS — L97222 Non-pressure chronic ulcer of left calf with fat layer exposed: Secondary | ICD-10-CM | POA: Insufficient documentation

## 2015-01-31 DIAGNOSIS — I2510 Atherosclerotic heart disease of native coronary artery without angina pectoris: Secondary | ICD-10-CM | POA: Insufficient documentation

## 2015-01-31 DIAGNOSIS — E119 Type 2 diabetes mellitus without complications: Secondary | ICD-10-CM | POA: Insufficient documentation

## 2015-01-31 DIAGNOSIS — I70248 Atherosclerosis of native arteries of left leg with ulceration of other part of lower left leg: Secondary | ICD-10-CM | POA: Insufficient documentation

## 2015-01-31 NOTE — Telephone Encounter (Signed)
Daughter concerned about fluid build up and extreme fatigue.  Would like Dr Wynetta Emery to call her to advise.

## 2015-01-31 NOTE — Telephone Encounter (Signed)
Saw the wound doctor today. They state that she has been swelling so much and having severe fatigue. Wound doctor states that she has edema and that she would need to see primary. Discussed that I think it's really important that we see Mom, I don't want to just prescribe medicine over the phone to her because she has medical problems and we don't know what's going on. I strongly advised that she go to the urgent care or the ER if she cannot get her in to see [place]. She was transferred up front to try to get in for an appointment.

## 2015-01-31 NOTE — Progress Notes (Addendum)
MISTIQUE, AUTEN ([id number]) Visit Report for 01/31/2015 Chief Complaint Document Details Patient Name: [name], [name] 01/31/2015 10:45 Date of Service: AM Medical Record [id number] Number: Patient Account Number: [id number] 1922/10/24 (81 y.o. Treating RN: Ahmed Prima Date of Birth/Sex: Female) Other Clinician: Primary Care Physician: Wynetta Emery, MEGAN Treating Christin Fudge Referring Physician: Park Liter Physician/Extender: Suella Grove in Treatment: 20 Information Obtained from: Patient Chief Complaint Patient presents to the wound care center today with an open arterial ulcer. The patient has had left lower extremity ulceration for about 1 month. Electronic Signature(s) Signed: 01/31/2015 11:41:27 AM By: Christin Fudge MD, FACS Entered By: Christin Fudge on 01/31/2015 11:41:27 [name] ([id number]) -------------------------------------------------------------------------------- HPI Details Patient Name: [name], [name] 01/31/2015 10:45 Date of Service: AM Medical Record [id number] Number: Patient Account Number: [id number] 11/10/1922 (81 y.o. Treating RN: Ahmed Prima Date of Birth/Sex: Female) Other Clinician: Primary Care Physician: Wynetta Emery, MEGAN Treating Christin Fudge Referring Physician: Park Liter Physician/Extender: Suella Grove in Treatment: 20 History of Present Illness Location: left lower extremity ulceration Quality: Patient reports experiencing burning to affected area(s). Severity: Patient states wound are getting worse. Duration: Patient has had the wound for < 4 weeks prior to presenting for treatment Timing: Pain in wound is constant (hurts all the time) Context: The wound appeared gradually over time Modifying Factors: Other treatment(s) tried include:has been on oral anti-buttocks since the beginning of the month and had 10 days of Augmentin and now is on doxycycline. Associated Signs and Symptoms: Patient reports having difficulty  standing for long periods. HPI Description: 81 year old patient was admitted to [facility] between 08/13/2014 and 08/17/2014 with cellulitis of the left lower extremity.duplex study done then showed no DVT and the patient was on IV vancomycin and Rocephin and also received Unasyn later. The patient was sent home on 10 days of Augmentin. Her other comorbidities of atrial fibrillation, essential hypertension, diabetes mellitus type 2, coronary artery disease were managed with appropriate medications. on 09/04/2014 she was seen in the ER and was found to have a 10 cm ulceration on the left lower extremity lateral and superior to the lateral malleolus. The granulation tissue was noted and she was referred to the wound center. the ER notes that her ABIs were within normal limits and she was sent home on consultative management. a duplex scan of the lower extremities was done by the nursing home and this showed that the right ABI was 0.96 with a multiphasic waveform throughout the right leg. There was a monophasic waveform of the left leg suggesting left external iliac artery stenosis with blunting distally and the ABI could not be calculated on the left side. Moderate to severe ischemia was suspected of the left leg and a MR angiogram was recommended. 09/17/2014 -- Xray Left tibia and fibula --- unremarkable tibia and fibula Her wound culture is back and this has grown a heavy growth of staph aureus sensitive to ciprofloxacin and clindamycin and Bactrim. She has been on doxycycline since July 14 for 10 days and we will review her clinically before switching her antibiotics if need be. After reviewing her chart she is not going to be here till 09/17/2014 and hence I will call in Bactrim DS 1 twice a day for 14 days. She has seen Dr. Lucky Cowboy for an opinion and she is scheduled a procedure on this coming Thursday. other than that the pain and swelling continues and she has no other  acute problems. 09/25/2014 -- she had a recent procedure done by Dr. Lucky Cowboy  for diffuse SFA stenosis, popliteal occlusion and tibial disease. she had at angioplasty of the left peroneal artery and tibioperoneal trunk, left superficial H[place], Empress L. ([id number]) femoral artery, left popliteal artery, distal SFA and popliteal artery. the patient is experiencing some swelling of the left lower extremity after the procedure. 10/09/2014 -- she has an appointment to see her vascular surgeon again this weekend for possibly a vascular Doppler. No other issues. 10/23/2014 -- The patient was recently seen by Dr. Lucky Cowboy on this past Friday. We do not have his reports but we do know that he was pleased with her progress and did not plan any further procedure in the near future. 10/30/2014 - we have received Dr. Bunnie Domino evaluation and he has mentioned that the duplex done showed a stenosis versus a lesion in the distal popliteal artery and tibioperoneal trunk in an area below her previous intervention and the overall flow was significantly better and the stent itself was patent. Her perfusion has improved but it was not optimized. Since her wound was improving he did not recommend any angiogram at this point. However if her wound worsens or infection develops he would recommend an angiogram and further evaluation to improve her perfusion. He is going to see her back in 6-8 weeks. 11/20/2014 -- she was not here last week because of her illness and is here to see as after 2 weeks. Her daughter has checked with her insurance company and she will have no out-of-pocket expense for her skin substitute. 12/04/2014 -- she has her skin substitute available but due to the nature of her wounds she will not use it today. 12/20/2014 -- she was at the vascular office yesterday and I believe a workup was done and the family member tells a that no surgical procedure was recommended. We'll await the official reports. 11/17/  2016 -- they have not been able to apply the compression stockings on the right lower extremity but the left one is looking very good. No fresh issues. 01/31/2015 -- we have not seen her back in about a month's time. the daughter who is her caregiver says it's extremely difficult to get out of the house and they find it impossible to transport her here on a regular basis. So far we have not received any report from the vascular office regarding her left lower extremity vascularity and whether any further workup needs to be done and we have requested this again. They have also noticed some drainage from the areas between her fourth and fifth toe on the left foot. Addendum: we have received some notes from 12/19/2014 and she had a post procedure lower extremity arterial duplex exam of the left lower extremity which showed more than 50% stenosis in the left superficial femoral artery, patent left distal superficial popliteal stent without evidence of stenosis, high-grade stenosis versus occlusion in the left distal popliteal and tibioperoneal trunk and left posterior tibial artery occlusion.. On that day she was seen by Miss [name] noted that the ABI was 1.01 on the right and 0.74 on the left. An angiogram was suggested but the patient was not interested in further intervention and said she would follow up in 3 months. Electronic Signature(s) Signed: 01/31/2015 1:29:28 PM By: Christin Fudge MD, FACS Previous Signature: 01/31/2015 12:00:57 PM Version By: Christin Fudge MD, FACS Previous Signature: 01/31/2015 11:44:57 AM Version By: Christin Fudge MD, FACS Previous Signature: 01/31/2015 11:44:48 AM Version By: Christin Fudge MD, FACS Entered By: Christin Fudge on 01/31/2015 13:29:27 Steinmetz, Ivonna L. (  [id number]) -------------------------------------------------------------------------------- Physical Exam Details Patient Name: [name], [name] 01/31/2015 10:45 Date of Service: AM Medical  Record [id number] Number: Patient Account Number: [id number] 23-Jul-1922 (81 y.o. Treating RN: Ahmed Prima Date of Birth/Sex: Female) Other Clinician: Primary Care Physician: Wynetta Emery, MEGAN Treating Christin Fudge Referring Physician: Park Liter Physician/Extender: Suella Grove in Treatment: 20 Constitutional . Pulse regular. Respirations normal and unlabored. Afebrile. . Eyes Nonicteric. Reactive to light. Ears, Nose, Mouth, and Throat Lips, teeth, and gums WNL.Marland Kitchen Moist mucosa without lesions . Neck supple and nontender. No palpable supraclavicular or cervical adenopathy. Normal sized without goiter. Respiratory WNL. No retractions.. Cardiovascular Pedal Pulses WNL. she has got bilateral +2 pitting edema both lower extremities.. Chest Breasts symmetical and no nipple discharge.. Breast tissue WNL, no masses, lumps, or tenderness.. Lymphatic No adneopathy. No adenopathy. No adenopathy. Musculoskeletal Adexa without tenderness or enlargement.. Digits and nails w/o clubbing, cyanosis, infection, petechiae, ischemia, or inflammatory conditions.. Integumentary (Hair, Skin) No suspicious lesions. No crepitus or fluctuance. No peri-wound warmth or erythema. No masses.Marland Kitchen Psychiatric Judgement and insight Intact.. No evidence of depression, anxiety, or agitation.. Notes The edema on both lower extremity is really increased and the wound on the dorsum of the left foot has minimal slough and we will continue with local care. she has gotten new area on the lateral part of her fourth toe and there is some redness there but there is no definite ulceration Electronic Signature(s) Signed: 01/31/2015 12:02:01 PM By: Christin Fudge MD, FACS Previous Signature: 01/31/2015 11:45:43 AM Version By: Christin Fudge MD, FACS Entered By: Christin Fudge on 01/31/2015 12:02:01 [name] ([id number]) DEBORRA, COSSEY  ([id number]) -------------------------------------------------------------------------------- Physician Orders Details Patient Name: MONIFAH, NARDIELLO 01/31/2015 10:45 Date of Service: AM Medical Record [id number] Number: Patient Account Number: [id number] 07-23-1922 (81 y.o. Treating RN: Ahmed Prima Date of Birth/Sex: Female) Other Clinician: Primary Care Physician: Wynetta Emery, MEGAN Treating Myia Bergh Referring Physician: Park Liter Physician/Extender: Suella Grove in Treatment: 20 Verbal / Phone Orders: Yes Clinician: Carolyne Fiscal, Debi Read Back and Verified: Yes Diagnosis Coding ICD-10 Coding Code Description I70.248 Atherosclerosis of native arteries of left leg with ulceration of other part of lower left leg L97.222 Non-pressure chronic ulcer of left calf with fat layer exposed L03.116 Cellulitis of left lower limb Wound Cleansing Wound #3 Left,Medial,Dorsal Foot o Clean wound with Normal Saline. Wound #4 Left,Lateral,Dorsal Foot o Clean wound with Normal Saline. Wound #5 Left,Medial Toe - Web between 4th and 5th o Clean wound with Normal Saline. Skin Barriers/Peri-Wound Care Wound #3 Left,Medial,Dorsal Foot o Skin Prep Wound #4 Left,Lateral,Dorsal Foot o Skin Prep Wound #5 Left,Medial Toe - Web between 4th and 5th o Skin Prep Primary Wound Dressing Wound #3 Left,Medial,Dorsal Foot o Aquacel Ag Wound #4 Left,Lateral,Dorsal Foot o Aquacel Ag Wound #5 Left,Medial Toe - Web between 4th and 5th o Aquacel Ag GERREN, DEROOS ([id number]) Secondary Dressing Wound #3 Left,Medial,Dorsal Foot o Foam Wound #4 Left,Lateral,Dorsal Foot o Foam Dressing Change Frequency Wound #3 Left,Medial,Dorsal Foot o Change dressing every week - and as needed Wound #4 Left,Lateral,Dorsal Foot o Change dressing every week - and as needed Wound #5 Left,Medial Toe - Web between 4th and 5th o Change dressing every week - and as needed Follow-up  Appointments Wound #3 Left,Medial,Dorsal Foot o Return Appointment in 2 weeks. Wound #4 Left,Lateral,Dorsal Foot o Return Appointment in 2 weeks. Wound #5 Left,Medial Toe - Web between 4th and 5th o Return Appointment in 2 weeks. Edema Control Wound #3 Left,Medial,Dorsal Foot o 3 Layer Compression System -  Bilateral o Elevate legs to the level of the heart and pump ankles as often as possible Wound #4 Left,Lateral,Dorsal Foot o 3 Layer Compression System - Bilateral o Elevate legs to the level of the heart and pump ankles as often as possible Wound #5 Left,Medial Toe - Web between 4th and 5th o 3 Layer Compression System - Bilateral o Elevate legs to the level of the heart and pump ankles as often as possible Home Health Wound #3 [place] Visits - Home Health to order Bilateral Juzo Compression wraps for patient measurements as follows- Left Ankle- 20.2cm Right Ankle- 22.5cm Torrens, Angeleah L. ([id number]) Left Calf- 37cm Right Calf- 38cm Bilateral Heel to Knee is 45cm o Home Health Nurse may visit PRN to address patientos wound care needs. o FACE TO FACE ENCOUNTER: MEDICARE and MEDICAID PATIENTS: I certify that this patient is under my care and that I had a face-to-face encounter that meets the physician face-to-face encounter requirements with this patient on this date. The encounter with the patient was in whole or in part for the following MEDICAL CONDITION: (primary reason for [place]) MEDICAL NECESSITY: I certify, that based on my findings, NURSING services are a medically necessary home health service. HOME BOUND STATUS: I certify that my clinical findings support that this patient is homebound (i.e., Due to illness or injury, pt requires aid of supportive devices such as crutches, cane, wheelchairs, walkers, the use of special transportation or the assistance of another person to leave their place of  residence. There is a normal inability to leave the home and doing so requires considerable and taxing effort. Other absences are for medical reasons / religious services and are infrequent or of short duration when for other reasons). o If current dressing causes regression in wound condition, may D/C ordered dressing product/s and apply Normal Saline Moist Dressing daily until next [place] / Other MD appointment. [place] of regression in wound condition at [phone]. o Please direct any NON-WOUND related issues/requests for orders to patient's Primary Care Physician Wound #4 Kara [place] Visits o Home Health Nurse may visit PRN to address patientos wound care needs. o FACE TO FACE ENCOUNTER: MEDICARE and MEDICAID PATIENTS: I certify that this patient is under my care and that I had a face-to-face encounter that meets the physician face-to-face encounter requirements with this patient on this date. The encounter with the patient was in whole or in part for the following MEDICAL CONDITION: (primary reason for [place]) MEDICAL NECESSITY: I certify, that based on my findings, NURSING services are a medically necessary home health service. HOME BOUND STATUS: I certify that my clinical findings support that this patient is homebound (i.e., Due to illness or injury, pt requires aid of supportive devices such as crutches, cane, wheelchairs, walkers, the use of special transportation or the assistance of another person to leave their place of residence. There is a normal inability to leave the home and doing so requires considerable and taxing effort. Other absences are for medical reasons / religious services and are infrequent or of short duration when for other reasons). o If current dressing causes regression in wound condition, may D/C ordered dressing product/s and apply Normal Saline Moist Dressing  daily until next [place] / Other MD appointment. [place] of regression in wound condition at [phone]. o Please direct any NON-WOUND related issues/requests for orders to patient's Primary Care Physician Wound #  5 Left,Medial Toe - Web between 4th and [place] Nurse may visit PRN to address patientos wound care needs. o FACE TO FACE ENCOUNTER: MEDICARE and MEDICAID PATIENTS: I certify that this patient is under my care and that I had a face-to-face encounter that meets the physician face-to-face encounter requirements with this patient on this date. The encounter with the patient was in whole or in part for the following MEDICAL CONDITION: (primary reason for [place]) MEDICAL NECESSITY: I certify, that based on my findings, NURSING services are a medically [name], [name]. ([id number]) necessary home health service. HOME BOUND STATUS: I certify that my clinical findings support that this patient is homebound (i.e., Due to illness or injury, pt requires aid of supportive devices such as crutches, cane, wheelchairs, walkers, the use of special transportation or the assistance of another person to leave their place of residence. There is a normal inability to leave the home and doing so requires considerable and taxing effort. Other absences are for medical reasons / religious services and are infrequent or of short duration when for other reasons). o If current dressing causes regression in wound condition, may D/C ordered dressing product/s and apply Normal Saline Moist Dressing daily until next [place] / Other MD appointment. [place] of regression in wound condition at [phone]. o Please direct any NON-WOUND related issues/requests for orders to patient's Primary Care Physician Electronic Signature(s) Signed: 01/31/2015 4:24:56 PM By: Christin Fudge MD,  FACS Signed: 01/31/2015 5:40:37 PM By: Alric Quan Entered By: Alric Quan on 01/31/2015 12:10:53 [name] ([id number]) -------------------------------------------------------------------------------- Problem List Details Patient Name: [name], [name] 01/31/2015 10:45 Date of Service: AM Medical Record [id number] Number: Patient Account Number: [id number] [birth year] (81 y.o. Treating RN: Ahmed Prima Date of Birth/Sex: Female) Other Clinician: Primary Care Physician: Park Liter Treating Taniaya Rudder Referring Physician: Park Liter Physician/Extender: Suella Grove in Treatment: 20 Active Problems ICD-10 Encounter Code Description Active Date Diagnosis I70.248 Atherosclerosis of native arteries of left leg with ulceration 09/07/2014 Yes of other part of lower left leg L97.222 Non-pressure chronic ulcer of left calf with fat layer 09/07/2014 Yes exposed L03.116 Cellulitis of left lower limb 09/07/2014 Yes Inactive Problems Resolved Problems Electronic Signature(s) Signed: 01/31/2015 11:41:21 AM By: Christin Fudge MD, FACS Entered By: Christin Fudge on 01/31/2015 11:41:21 [name] ([id number]) -------------------------------------------------------------------------------- Progress Note Details Patient Name: [name] 01/31/2015 10:45 Date of Service: AM Medical Record [id number] Number: Patient Account Number: [id number] 1922/08/04 (81 y.o. Treating RN: Ahmed Prima Date of Birth/Sex: Female) Other Clinician: Primary Care Physician: Wynetta Emery, MEGAN Treating Christin Fudge Referring Physician: Park Liter Physician/Extender: Suella Grove in Treatment: 20 Subjective Chief Complaint Information obtained from Patient Patient presents to the wound care center today with an open arterial ulcer. The patient has had left lower extremity ulceration for about 1 month. History of Present Illness (HPI) The following HPI elements were documented  for the patient's wound: Location: left lower extremity ulceration Quality: Patient reports experiencing burning to affected area(s). Severity: Patient states wound are getting worse. Duration: Patient has had the wound for < 4 weeks prior to presenting for treatment Timing: Pain in wound is constant (hurts all the time) Context: The wound appeared gradually over time Modifying Factors: Other treatment(s) tried include:has been on oral anti-buttocks since the beginning of the month and had 10 days of Augmentin and now is on doxycycline. Associated Signs and Symptoms: Patient reports having difficulty standing for long periods.  81 year old patient was admitted to [facility] between 08/13/2014 and 08/17/2014 with cellulitis of the left lower extremity.duplex study done then showed no DVT and the patient was on IV vancomycin and Rocephin and also received Unasyn later. The patient was sent home on 10 days of Augmentin. Her other comorbidities of atrial fibrillation, essential hypertension, diabetes mellitus type 2, coronary artery disease were managed with appropriate medications. on 09/04/2014 she was seen in the ER and was found to have a 10 cm ulceration on the left lower extremity lateral and superior to the lateral malleolus. The granulation tissue was noted and she was referred to the wound center. the ER notes that her ABIs were within normal limits and she was sent home on consultative management. a duplex scan of the lower extremities was done by the nursing home and this showed that the right ABI was 0.96 with a multiphasic waveform throughout the right leg. There was a monophasic waveform of the left leg suggesting left external iliac artery stenosis with blunting distally and the ABI could not be calculated on the left side. Moderate to severe ischemia was suspected of the left leg and a MR angiogram was recommended. 09/17/2014 -- Xray Left tibia and fibula ---  unremarkable tibia and fibula Her wound culture is back and this has grown a heavy growth of staph aureus sensitive to ciprofloxacin and clindamycin and Bactrim. [name], [name] ([id number]) She has been on doxycycline since July 14 for 10 days and we will review her clinically before switching her antibiotics if need be. After reviewing her chart she is not going to be here till 09/17/2014 and hence I will call in Bactrim DS 1 twice a day for 14 days. She has seen Dr. Lucky Cowboy for an opinion and she is scheduled a procedure on this coming Thursday. other than that the pain and swelling continues and she has no other acute problems. 09/25/2014 -- she had a recent procedure done by Dr. Lucky Cowboy for diffuse SFA stenosis, popliteal occlusion and tibial disease. she had at angioplasty of the left peroneal artery and tibioperoneal trunk, left superficial femoral artery, left popliteal artery, distal SFA and popliteal artery. the patient is experiencing some swelling of the left lower extremity after the procedure. 10/09/2014 -- she has an appointment to see her vascular surgeon again this weekend for possibly a vascular Doppler. No other issues. 10/23/2014 -- The patient was recently seen by Dr. Lucky Cowboy on this past Friday. We do not have his reports but we do know that he was pleased with her progress and did not plan any further procedure in the near future. 10/30/2014 - we have received Dr. Bunnie Domino evaluation and he has mentioned that the duplex done showed a stenosis versus a lesion in the distal popliteal artery and tibioperoneal trunk in an area below her previous intervention and the overall flow was significantly better and the stent itself was patent. Her perfusion has improved but it was not optimized. Since her wound was improving he did not recommend any angiogram at this point. However if her wound worsens or infection develops he would recommend an angiogram and further evaluation to improve her  perfusion. He is going to see her back in 6-8 weeks. 11/20/2014 -- she was not here last week because of her illness and is here to see as after 2 weeks. Her daughter has checked with her insurance company and she will have no out-of-pocket expense for her skin substitute. 12/04/2014 -- she has her  skin substitute available but due to the nature of her wounds she will not use it today. 12/20/2014 -- she was at the vascular office yesterday and I believe a workup was done and the family member tells a that no surgical procedure was recommended. We'll await the official reports. 11/17/ 2016 -- they have not been able to apply the compression stockings on the right lower extremity but the left one is looking very good. No fresh issues. 01/31/2015 -- we have not seen her back in about a month's time. the daughter who is her caregiver says it's extremely difficult to get out of the house and they find it impossible to transport her here on a regular basis. So far we have not received any report from the vascular office regarding her left lower extremity vascularity and whether any further workup needs to be done and we have requested this again. They have also noticed some drainage from the areas between her fourth and fifth toe on the left foot. Addendum: we have received some notes from 12/19/2014 and she had a post procedure lower extremity arterial duplex exam of the left lower extremity which showed more than 50% stenosis in the left superficial femoral artery, patent left distal superficial popliteal stent without evidence of stenosis, high-grade stenosis versus occlusion in the left distal popliteal and tibioperoneal trunk and left posterior tibial artery occlusion.. On that day she was seen by Miss [name] noted that the ABI was 1.01 on the right and 0.74 on the left. An angiogram was suggested but the patient was not interested in further intervention and said she would follow up in 3  months. KHELSEA, BEDSWORTH L. ([id number]) Objective Constitutional Pulse regular. Respirations normal and unlabored. Afebrile. Vitals Time Taken: 11:25 AM, Height: 67 in, Weight: 159 lbs, BMI: 24.9, Pulse: 77 bpm, Respiratory Rate: 16 breaths/min, Blood Pressure: 101/55 mmHg. Eyes Nonicteric. Reactive to light. Ears, Nose, Mouth, and Throat Lips, teeth, and gums WNL.Marland Kitchen Moist mucosa without lesions . Neck supple and nontender. No palpable supraclavicular or cervical adenopathy. Normal sized without goiter. Respiratory WNL. No retractions.. Cardiovascular Pedal Pulses WNL. she has got bilateral +2 pitting edema both lower extremities.. Chest Breasts symmetical and no nipple discharge.. Breast tissue WNL, no masses, lumps, or tenderness.. Lymphatic No adneopathy. No adenopathy. No adenopathy. Musculoskeletal Adexa without tenderness or enlargement.. Digits and nails w/o clubbing, cyanosis, infection, petechiae, ischemia, or inflammatory conditions.Marland Kitchen Psychiatric Judgement and insight Intact.. No evidence of depression, anxiety, or agitation.. General Notes: The edema on both lower extremity is really increased and the wound on the dorsum of the left foot has minimal slough and we will continue with local care. she has gotten new area on the lateral part of her fourth toe and there is some redness there but there is no definite ulceration Integumentary (Hair, Skin) No suspicious lesions. No crepitus or fluctuance. No peri-wound warmth or erythema. No masses.. Wound #3 status is Open. Original cause of wound was Pressure Injury. The wound is located on the Left,Medial,Dorsal Foot. The wound measures 0.4cm length x 0.8cm width x 0.2cm depth; 0.251cm^2 area and 0.05cm^3 volume. The wound is limited to skin breakdown. There is no tunneling or undermining noted. There is a medium amount of serosanguineous drainage noted. The wound margin is flat and intact. There Broussard, [name] ([id number]) is  no granulation within the wound bed. There is a large (67-100%) amount of necrotic tissue within the wound bed including Adherent Slough. The periwound skin appearance exhibited: Moist. The periwound  skin appearance did not exhibit: Callus, Crepitus, Excoriation, Fluctuance, Friable, Induration, Localized Edema, Rash, Scarring, Dry/Scaly, Maceration, Atrophie Blanche, Cyanosis, Ecchymosis, Hemosiderin Staining, Mottled, Pallor, Rubor, Erythema. Periwound temperature was noted as No Abnormality. Wound #4 status is Open. Original cause of wound was Shear/Friction. The wound is located on the Left,Lateral,Dorsal Foot. The wound measures 0.6cm length x 0.8cm width x 0.2cm depth; 0.377cm^2 area and 0.075cm^3 volume. The wound is limited to skin breakdown. There is no tunneling or undermining noted. There is a medium amount of serous drainage noted. The wound margin is flat and intact. There is no granulation within the wound bed. There is a large (67-100%) amount of necrotic tissue within the wound bed including Adherent Slough. The periwound skin appearance exhibited: Localized Edema, Moist. The periwound skin appearance did not exhibit: Callus, Crepitus, Excoriation, Fluctuance, Friable, Induration, Rash, Scarring, Dry/Scaly, Maceration, Atrophie Blanche, Cyanosis, Ecchymosis, Hemosiderin Staining, Mottled, Pallor, Rubor, Erythema. Periwound temperature was noted as No Abnormality. The periwound has tenderness on palpation. Wound #5 status is Open. Original cause of wound was Gradually Appeared. The wound is located on the Left,Medial Toe - Web between 4th and 5th. The wound measures 0.3cm length x 0.3cm width x 0.2cm depth; 0.071cm^2 area and 0.014cm^3 volume. The wound is limited to skin breakdown. There is no tunneling or undermining noted. There is a medium amount of serosanguineous drainage noted. The wound margin is flat and intact. There is small (1-33%) red granulation within the wound bed.  There is a large (67- 100%) amount of necrotic tissue within the wound bed including Adherent Slough. The periwound skin appearance exhibited: Localized Edema, Moist. Assessment Active Problems ICD-10 I70.248 - Atherosclerosis of native arteries of left leg with ulceration of other part of lower left leg L97.222 - Non-pressure chronic ulcer of left calf with fat layer exposed L03.116 - Cellulitis of left lower limb I have recommended calcium alginate with silver to the left foot with a 3 layer compression wrap. We will also put some Aquacel Ag between her toes and the webspace on the left fourth and fifth toe. I have encouraged her to elevate her limbs and use her compression stockings regularly. Her daughter says that they had not received the ones ordered and they will reorder them. See her back next week or whenever they can bring her around. [name], [name] ([id number]) Plan Wound Cleansing: Wound #3 Left,Medial,Dorsal Foot: Clean wound with Normal Saline. Wound #4 Left,Lateral,Dorsal Foot: Clean wound with Normal Saline. Wound #5 Left,Medial Toe - Web between 4th and 5th: Clean wound with Normal Saline. Skin Barriers/Peri-Wound Care: Wound #3 Left,Medial,Dorsal Foot: Skin Prep Wound #4 Left,Lateral,Dorsal Foot: Skin Prep Wound #5 Left,Medial Toe - Web between 4th and 5th: Skin Prep Primary Wound Dressing: Wound #3 Left,Medial,Dorsal Foot: Aquacel Ag Wound #4 Left,Lateral,Dorsal Foot: Aquacel Ag Wound #5 Left,Medial Toe - Web between 4th and 5th: Aquacel Ag Secondary Dressing: Wound #3 Left,Medial,Dorsal Foot: Foam Wound #4 Left,Lateral,Dorsal Foot: Foam Dressing Change Frequency: Wound #3 Left,Medial,Dorsal Foot: Change dressing every week - and as needed Wound #4 Left,Lateral,Dorsal Foot: Change dressing every week - and as needed Wound #5 Left,Medial Toe - Web between 4th and 5th: Change dressing every week - and as needed Follow-up Appointments: Wound #3  Left,Medial,Dorsal Foot: Return Appointment in 2 weeks. Wound #4 Left,Lateral,Dorsal Foot: Return Appointment in 2 weeks. Wound #5 Left,Medial Toe - Web between 4th and 5th: Return Appointment in 2 weeks. Edema Control: Wound #3 Left,Medial,Dorsal Foot: 3 Layer Compression System - Bilateral Elevate legs  to the level of the heart and pump ankles as often as possible Wound #4 Left,Lateral,Dorsal Foot: 3 Layer Compression System - Bilateral Mennen, Gayanne L. ([id number]) Elevate legs to the level of the heart and pump ankles as often as possible Wound #5 Left,Medial Toe - Web between 4th and 5th: 3 Layer Compression System - Bilateral Elevate legs to the level of the heart and pump ankles as often as possible Home Health: Wound #3 Left,Medial,Dorsal Foot: [place] to order Bilateral Juzo Compression wraps for patient measurements as follows- Left Ankle- 20.2cm Right Ankle- 22.5cm Left Calf- 37cm Right Calf- 38cm Bilateral Heel to Knee is 45cm Home Health Nurse may visit PRN to address patient s wound care needs. FACE TO FACE ENCOUNTER: MEDICARE and MEDICAID PATIENTS: I certify that this patient is under my care and that I had a face-to-face encounter that meets the physician face-to-face encounter requirements with this patient on this date. The encounter with the patient was in whole or in part for the following MEDICAL CONDITION: (primary reason for [place]) MEDICAL NECESSITY: I certify, that based on my findings, NURSING services are a medically necessary home health service. HOME BOUND STATUS: I certify that my clinical findings support that this patient is homebound (i.e., Due to illness or injury, pt requires aid of supportive devices such as crutches, cane, wheelchairs, walkers, the use of special transportation or the assistance of another person to leave their place of residence. There is a normal inability to leave the home and doing so  requires considerable and taxing effort. Other absences are for medical reasons / religious services and are infrequent or of short duration when for other reasons). If current dressing causes regression in wound condition, may D/C ordered dressing product/s and apply Normal Saline Moist Dressing daily until next [place] / Other MD appointment. [place] of regression in wound condition at [phone]. Please direct any NON-WOUND related issues/requests for orders to patient's Primary Care Physician Wound #4 Left,Lateral,Dorsal Foot: [place] Nurse may visit PRN to address patient s wound care needs. FACE TO FACE ENCOUNTER: MEDICARE and MEDICAID PATIENTS: I certify that this patient is under my care and that I had a face-to-face encounter that meets the physician face-to-face encounter requirements with this patient on this date. The encounter with the patient was in whole or in part for the following MEDICAL CONDITION: (primary reason for [place]) MEDICAL NECESSITY: I certify, that based on my findings, NURSING services are a medically necessary home health service. HOME BOUND STATUS: I certify that my clinical findings support that this patient is homebound (i.e., Due to illness or injury, pt requires aid of supportive devices such as crutches, cane, wheelchairs, walkers, the use of special transportation or the assistance of another person to leave their place of residence. There is a normal inability to leave the home and doing so requires considerable and taxing effort. Other absences are for medical reasons / religious services and are infrequent or of short duration when for other reasons). If current dressing causes regression in wound condition, may D/C ordered dressing product/s and apply Normal Saline Moist Dressing daily until next [place] / Other MD appointment. [name] of  regression in wound condition at [phone]. Please direct any NON-WOUND related issues/requests for orders to patient's Primary Care Physician Wound #5 Left,Medial Toe - Web between 4th and 5th: [name] Nurse  may visit PRN to address patient s wound care needs. FACE TO FACE ENCOUNTER: MEDICARE and MEDICAID PATIENTS: I certify that this patient is under my care and that I had a face-to-face encounter that meets the physician face-to-face encounter requirements with this patient on this date. The encounter with the patient was in whole or in part for the following MEDICAL CONDITION: (primary reason for [place]) MEDICAL NECESSITY: I certify, that based on my findings, NURSING services are a medically necessary home health service. HOME BOUND STATUS: I certify that my clinical findings support that this patient is homebound (i.e., Due to illness or injury, pt requires aid of supportive devices such as crutches, cane, wheelchairs, walkers, the use Boccio, [name] L. ([id number]) of special transportation or the assistance of another person to leave their place of residence. There is a normal inability to leave the home and doing so requires considerable and taxing effort. Other absences are for medical reasons / religious services and are infrequent or of short duration when for other reasons). If current dressing causes regression in wound condition, may D/C ordered dressing product/s and apply Normal Saline Moist Dressing daily until next [place] / Other MD appointment. [place] of regression in wound condition at [phone]. Please direct any NON-WOUND related issues/requests for orders to patient's Primary Care Physician I have recommended calcium alginate with silver to the left foot with a 3 layer compression wrap. We will also put some Aquacel Ag between her toes and the webspace on the left fourth and fifth toe. I  have encouraged her to elevate her limbs and use her compression stockings regularly. Her daughter says that they had not received the ones ordered and they will reorder them. See her back next week or whenever they can bring her around. Electronic Signature(s) Signed: 01/31/2015 1:29:41 PM By: Christin Fudge MD, FACS Previous Signature: 01/31/2015 12:03:30 PM Version By: Christin Fudge MD, FACS Entered By: Christin Fudge on 01/31/2015 13:29:40 [name] ([id number]) -------------------------------------------------------------------------------- SuperBill Details Patient Name: [name] Date of Service: 01/31/2015 Medical Record Number: [id number] Patient Account Number: [id number] Date of Birth/Sex: 01-22-1923 (81 y.o. Female) Treating RN: Carolyne Fiscal, Debi Primary Care Physician: Park Liter Other Clinician: Referring Physician: Park Liter Treating Physician/Extender: Frann Rider in Treatment: 20 Diagnosis Coding ICD-10 Codes Code Description I70.248 Atherosclerosis of native arteries of left leg with ulceration of other part of lower left leg L97.222 Non-pressure chronic ulcer of left calf with fat layer exposed L03.116 Cellulitis of left lower limb Facility Procedures CPT4: Description Modifier Quantity Code [id number] BILATERAL: Application of multi-layer venous compression 1 system; leg (below knee), including ankle and foot. Physician Procedures CPT4: Description Modifier Quantity Code [id number] - WC PHYS LEVEL 3 - EST PT 1 ICD-10 Description Diagnosis I70.248 Atherosclerosis of native arteries of left leg with ulceration of other part of lower left leg L97.222 Non-pressure chronic ulcer of  left calf with fat layer exposed L03.116 Cellulitis of left lower limb Electronic Signature(s) Signed: 01/31/2015 5:40:37 PM By: Alric Quan Previous Signature: 01/31/2015 12:03:55 PM Version By: Christin Fudge MD, FACS Entered By: Alric Quan on  01/31/2015 16:30:18

## 2015-02-01 ENCOUNTER — Emergency Department: Payer: Medicare Other

## 2015-02-01 ENCOUNTER — Ambulatory Visit (INDEPENDENT_AMBULATORY_CARE_PROVIDER_SITE_OTHER): Payer: Medicare Other | Admitting: Family Medicine

## 2015-02-01 ENCOUNTER — Encounter: Payer: Self-pay | Admitting: Family Medicine

## 2015-02-01 ENCOUNTER — Inpatient Hospital Stay
Admit: 2015-02-01 | Discharge: 2015-02-01 | Disposition: A | Discharge status: Home / Self Care | Payer: Medicare Other | Attending: Internal Medicine | Admitting: Internal Medicine

## 2015-02-01 ENCOUNTER — Other Ambulatory Visit: Payer: Self-pay

## 2015-02-01 ENCOUNTER — Inpatient Hospital Stay
Admission: EM | Admit: 2015-02-01 | Discharge: 2015-02-05 | DRG: 292 | Disposition: A | Discharge status: Skilled Nursing Facility | Payer: Medicare Other | Attending: Internal Medicine | Admitting: Internal Medicine

## 2015-02-01 ENCOUNTER — Encounter: Payer: Self-pay | Admitting: Emergency Medicine

## 2015-02-01 VITALS — BP 106/70 | HR 84 | Temp 97.7°F

## 2015-02-01 DIAGNOSIS — Z23 Encounter for immunization: Secondary | ICD-10-CM

## 2015-02-01 DIAGNOSIS — Z823 Family history of stroke: Secondary | ICD-10-CM | POA: Diagnosis not present

## 2015-02-01 DIAGNOSIS — R609 Edema, unspecified: Secondary | ICD-10-CM | POA: Diagnosis not present

## 2015-02-01 DIAGNOSIS — I5023 Acute on chronic systolic (congestive) heart failure: Principal | ICD-10-CM | POA: Diagnosis present

## 2015-02-01 DIAGNOSIS — I255 Ischemic cardiomyopathy: Secondary | ICD-10-CM | POA: Diagnosis present

## 2015-02-01 DIAGNOSIS — Z8249 Family history of ischemic heart disease and other diseases of the circulatory system: Secondary | ICD-10-CM | POA: Diagnosis not present

## 2015-02-01 DIAGNOSIS — I10 Essential (primary) hypertension: Secondary | ICD-10-CM | POA: Diagnosis present

## 2015-02-01 DIAGNOSIS — I509 Heart failure, unspecified: Secondary | ICD-10-CM | POA: Diagnosis not present

## 2015-02-01 DIAGNOSIS — I4891 Unspecified atrial fibrillation: Secondary | ICD-10-CM | POA: Diagnosis present

## 2015-02-01 DIAGNOSIS — Z8673 Personal history of transient ischemic attack (TIA), and cerebral infarction without residual deficits: Secondary | ICD-10-CM | POA: Diagnosis not present

## 2015-02-01 DIAGNOSIS — I2510 Atherosclerotic heart disease of native coronary artery without angina pectoris: Secondary | ICD-10-CM | POA: Diagnosis present

## 2015-02-01 DIAGNOSIS — L8990 Pressure ulcer of unspecified site, unspecified stage: Secondary | ICD-10-CM | POA: Insufficient documentation

## 2015-02-01 DIAGNOSIS — R778 Other specified abnormalities of plasma proteins: Secondary | ICD-10-CM

## 2015-02-01 DIAGNOSIS — Z9841 Cataract extraction status, right eye: Secondary | ICD-10-CM | POA: Diagnosis not present

## 2015-02-01 DIAGNOSIS — Z90722 Acquired absence of ovaries, bilateral: Secondary | ICD-10-CM

## 2015-02-01 DIAGNOSIS — M1990 Unspecified osteoarthritis, unspecified site: Secondary | ICD-10-CM | POA: Diagnosis present

## 2015-02-01 DIAGNOSIS — Z8542 Personal history of malignant neoplasm of other parts of uterus: Secondary | ICD-10-CM

## 2015-02-01 DIAGNOSIS — I081 Rheumatic disorders of both mitral and tricuspid valves: Secondary | ICD-10-CM | POA: Diagnosis present

## 2015-02-01 DIAGNOSIS — Z951 Presence of aortocoronary bypass graft: Secondary | ICD-10-CM | POA: Diagnosis not present

## 2015-02-01 DIAGNOSIS — I248 Other forms of acute ischemic heart disease: Secondary | ICD-10-CM | POA: Diagnosis present

## 2015-02-01 DIAGNOSIS — E119 Type 2 diabetes mellitus without complications: Secondary | ICD-10-CM | POA: Diagnosis present

## 2015-02-01 DIAGNOSIS — N390 Urinary tract infection, site not specified: Secondary | ICD-10-CM | POA: Diagnosis present

## 2015-02-01 DIAGNOSIS — R7989 Other specified abnormal findings of blood chemistry: Secondary | ICD-10-CM

## 2015-02-01 DIAGNOSIS — Z79899 Other long term (current) drug therapy: Secondary | ICD-10-CM | POA: Diagnosis not present

## 2015-02-01 DIAGNOSIS — I639 Cerebral infarction, unspecified: Secondary | ICD-10-CM

## 2015-02-01 DIAGNOSIS — Z888 Allergy status to other drugs, medicaments and biological substances status: Secondary | ICD-10-CM

## 2015-02-01 DIAGNOSIS — Z90710 Acquired absence of both cervix and uterus: Secondary | ICD-10-CM

## 2015-02-01 DIAGNOSIS — I959 Hypotension, unspecified: Secondary | ICD-10-CM | POA: Diagnosis present

## 2015-02-01 DIAGNOSIS — I872 Venous insufficiency (chronic) (peripheral): Secondary | ICD-10-CM | POA: Diagnosis present

## 2015-02-01 HISTORY — DX: Lymphedema, not elsewhere classified: I89.0

## 2015-02-01 HISTORY — DX: Venous insufficiency (chronic) (peripheral): I87.2

## 2015-02-01 LAB — BRAIN NATRIURETIC PEPTIDE: B NATRIURETIC PEPTIDE 5: 3310 pg/mL — AB (ref 0.0–100.0)

## 2015-02-01 LAB — URINALYSIS COMPLETE WITH MICROSCOPIC (ARMC ONLY)
Bilirubin Urine: NEGATIVE
Glucose, UA: NEGATIVE mg/dL
Ketones, ur: NEGATIVE mg/dL
Leukocytes, UA: NEGATIVE
Nitrite: NEGATIVE
PROTEIN: NEGATIVE mg/dL
Specific Gravity, Urine: 1.01 (ref 1.005–1.030)
pH: 5 (ref 5.0–8.0)

## 2015-02-01 LAB — CBC WITH DIFFERENTIAL/PLATELET
Basophils Absolute: 0 10*3/uL (ref 0–0.1)
Basophils Relative: 0 %
EOS ABS: 0 10*3/uL (ref 0–0.7)
HCT: 42.4 % (ref 35.0–47.0)
Hemoglobin: 13 g/dL (ref 12.0–16.0)
LYMPHS ABS: 1.3 10*3/uL (ref 1.0–3.6)
Lymphocytes Relative: 13 %
MCH: 24.6 pg — AB (ref 26.0–34.0)
MCHC: 30.6 g/dL — AB (ref 32.0–36.0)
MCV: 80.4 fL (ref 80.0–100.0)
Monocytes Absolute: 1.1 10*3/uL — ABNORMAL HIGH (ref 0.2–0.9)
Monocytes Relative: 11 %
Neutro Abs: 7.5 10*3/uL — ABNORMAL HIGH (ref 1.4–6.5)
Neutrophils Relative %: 76 %
PLATELETS: 247 10*3/uL (ref 150–440)
RBC: 5.27 MIL/uL — AB (ref 3.80–5.20)
RDW: 17.1 % — ABNORMAL HIGH (ref 11.5–14.5)
WBC: 10 10*3/uL (ref 3.6–11.0)

## 2015-02-01 LAB — BASIC METABOLIC PANEL
Anion gap: 10 (ref 5–15)
BUN: 50 mg/dL — AB (ref 6–20)
CO2: 27 mmol/L (ref 22–32)
CREATININE: 1.34 mg/dL — AB (ref 0.44–1.00)
Calcium: 10 mg/dL (ref 8.9–10.3)
Chloride: 102 mmol/L (ref 101–111)
GFR calc Af Amer: 39 mL/min — ABNORMAL LOW (ref >60)
GFR, EST NON AFRICAN AMERICAN: 34 mL/min — AB (ref >60)
Glucose, Bld: 215 mg/dL — ABNORMAL HIGH (ref 65–99)
POTASSIUM: 4.9 mmol/L (ref 3.5–5.1)
Sodium: 139 mmol/L (ref 135–145)

## 2015-02-01 LAB — TROPONIN I
TROPONIN I: 0.16 ng/mL — AB (ref <0.031)
Troponin I: 0.15 ng/mL — ABNORMAL HIGH (ref <0.031)
Troponin I: 0.15 ng/mL — ABNORMAL HIGH (ref <0.031)

## 2015-02-01 MED ORDER — METOPROLOL TARTRATE 25 MG PO TABS
25.0000 mg | ORAL_TABLET | Freq: Two times a day (BID) | ORAL | Status: DC
  Administered 2015-02-01 – 2015-02-03 (×4): 25 mg via ORAL
  Filled 2015-02-01 (×5): qty 1

## 2015-02-01 MED ORDER — BACITRACIN-POLYMYXIN B 500-10000 UNIT/GM OP OINT
1.0000 "application " | TOPICAL_OINTMENT | Freq: Two times a day (BID) | OPHTHALMIC | Status: DC
  Administered 2015-02-01 – 2015-02-05 (×6): 1 via OPHTHALMIC
  Filled 2015-02-01 (×2): qty 3.5

## 2015-02-01 MED ORDER — LISINOPRIL 20 MG PO TABS
20.0000 mg | ORAL_TABLET | ORAL | Status: DC
Start: 2015-02-02 — End: 2015-02-04
  Administered 2015-02-02 – 2015-02-04 (×3): 20 mg via ORAL
  Filled 2015-02-01 (×3): qty 1

## 2015-02-01 MED ORDER — ACETAMINOPHEN 325 MG PO TABS
650.0000 mg | ORAL_TABLET | Freq: Four times a day (QID) | ORAL | Status: DC | PRN
  Administered 2015-02-01 – 2015-02-02 (×3): 650 mg via ORAL
  Filled 2015-02-01 (×3): qty 2

## 2015-02-01 MED ORDER — APIXABAN 2.5 MG PO TABS
2.5000 mg | ORAL_TABLET | Freq: Two times a day (BID) | ORAL | Status: DC
  Administered 2015-02-01 – 2015-02-05 (×8): 2.5 mg via ORAL
  Filled 2015-02-01 (×8): qty 1

## 2015-02-01 MED ORDER — LEVOTHYROXINE SODIUM 25 MCG PO TABS
25.0000 ug | ORAL_TABLET | Freq: Every day | ORAL | Status: DC
Start: 2015-02-02 — End: 2015-02-05
  Administered 2015-02-02 – 2015-02-05 (×4): 25 ug via ORAL
  Filled 2015-02-01 (×4): qty 1

## 2015-02-01 MED ORDER — SODIUM CHLORIDE 0.9 % IJ SOLN
3.0000 mL | Freq: Two times a day (BID) | INTRAMUSCULAR | Status: DC
  Administered 2015-02-01 – 2015-02-04 (×7): 3 mL via INTRAVENOUS

## 2015-02-01 MED ORDER — POTASSIUM CHLORIDE CRYS ER 10 MEQ PO TBCR
10.0000 meq | EXTENDED_RELEASE_TABLET | Freq: Every day | ORAL | Status: DC
  Administered 2015-02-02 – 2015-02-05 (×4): 10 meq via ORAL
  Filled 2015-02-01 (×4): qty 1

## 2015-02-01 MED ORDER — ACETAMINOPHEN 650 MG RE SUPP: 650.0000 mg | Freq: Four times a day (QID) | RECTAL | Status: DC | PRN

## 2015-02-01 MED ORDER — ONDANSETRON HCL 4 MG PO TABS: 4.0000 mg | ORAL_TABLET | Freq: Four times a day (QID) | ORAL | Status: DC | PRN

## 2015-02-01 MED ORDER — FUROSEMIDE 10 MG/ML IJ SOLN
40.0000 mg | Freq: Two times a day (BID) | INTRAMUSCULAR | Status: DC
Start: 2015-02-01 — End: 2015-02-03
  Administered 2015-02-02 (×2): 40 mg via INTRAVENOUS
  Filled 2015-02-01 (×2): qty 4

## 2015-02-01 MED ORDER — ALPRAZOLAM 0.25 MG PO TABS
0.2500 mg | ORAL_TABLET | Freq: Three times a day (TID) | ORAL | Status: DC | PRN
  Administered 2015-02-04 – 2015-02-05 (×2): 0.25 mg via ORAL
  Filled 2015-02-01 (×2): qty 1

## 2015-02-01 MED ORDER — CITALOPRAM HYDROBROMIDE 20 MG PO TABS
20.0000 mg | ORAL_TABLET | Freq: Every day | ORAL | Status: DC
  Administered 2015-02-02 – 2015-02-05 (×4): 20 mg via ORAL
  Filled 2015-02-01 (×4): qty 1

## 2015-02-01 MED ORDER — LIDOCAINE 5 % EX PTCH
1.0000 | MEDICATED_PATCH | CUTANEOUS | Status: DC
  Administered 2015-02-01 – 2015-02-04 (×4): 1 via TRANSDERMAL
  Filled 2015-02-01 (×6): qty 1

## 2015-02-01 MED ORDER — FUROSEMIDE 10 MG/ML IJ SOLN
40.0000 mg | Freq: Once | INTRAMUSCULAR | Status: AC
  Administered 2015-02-01: 40 mg via INTRAVENOUS
  Filled 2015-02-01: qty 4

## 2015-02-01 MED ORDER — ONDANSETRON HCL 4 MG/2ML IJ SOLN: 4.0000 mg | Freq: Four times a day (QID) | INTRAMUSCULAR | Status: DC | PRN

## 2015-02-01 MED ORDER — DOCUSATE SODIUM 100 MG PO CAPS
100.0000 mg | ORAL_CAPSULE | Freq: Every evening | ORAL | Status: DC | PRN
  Administered 2015-02-03: 100 mg via ORAL
  Filled 2015-02-01: qty 1

## 2015-02-01 NOTE — Progress Notes (Signed)
[name] is a 81 y.o. female patient admitted from ED awake, alert - oriented  X 4 - no acute distress noted.  VSS - Blood pressure 118/60, pulse 91, temperature 98 F (36.7 C), temperature source Oral, resp. rate 19, height 5\' 6"  (1.676 m), weight 68.448 kg (150 lb 14.4 oz), SpO2 93 %.    IV in place, occlusive dsg intact without redness.  Orientation to room, and floor completed with information packet given to patient/family. Admission INP armband ID verified with patient/family, and in place. SR up x 2, fall assessment complete, with patient and family able to verbalize understanding of risk associated with falls, and verbalized understanding to call nsg before up out of bed.  Call light within reach, patient able to voice, and demonstrate understanding.  Skin, clean-dry- intact without evidence of bruising, or skin tears.   No evidence of skin break down noted on exam. Skin assessed with Riesa Pope. Patient lower extremities are wrapped. Per Wound care nurse okay to keep legs wrapped, will re-assess on Monday.      Will cont to eval and treat per MD orders.  Horton Finer, RN 02/01/2015 4:50 PM

## 2015-02-01 NOTE — Progress Notes (Signed)
BP 106/70 mmHg  Pulse 84  Temp(Src) 97.7 F (36.5 C)  Ht   Wt   SpO2 91%  Unable to obtain weight Subjective:    Patient ID: [name], female    DOB: October 13, 1922, 81 y.o.   MRN: [id number]  HPI: [name] is a 81 y.o. female  Chief Complaint  Patient presents with  . Leg Swelling    pt's daughter states the pt's legs have been swelling for a while now  . Fatigue    pt's daughter states that the pt cannot stay awake   [name] comes in today for evaluation of swelling. She states that she has had a great deal of swelling for about week she has had more more swelling to the point that she cannot get herself up to go to the bathroom. Has been feeling short of breath. Has been feeling really tired and falling asleep   Relevant past medical, surgical, family and social history reviewed and updated as indicated. Interim medical history since our last visit reviewed. Allergies and medications reviewed and updated.  Review of Systems  Constitutional: Positive for appetite change and unexpected weight change. Negative for fever, chills, diaphoresis and fatigue.  HENT: Negative.   Respiratory: Positive for shortness of breath. Negative for cough, choking, chest tightness, wheezing and stridor.   Cardiovascular: Positive for leg swelling. Negative for chest pain and palpitations.  Genitourinary: Positive for difficulty urinating.  Musculoskeletal: Negative.   Psychiatric/Behavioral: Negative.     Per HPI unless specifically indicated above     Objective:    BP 106/70 mmHg  Pulse 84  Temp(Src) 97.7 F (36.5 C)  Ht   Wt   SpO2 91%  Wt Readings from Last 3 Encounters:  12/07/14 155 lb (70.308 kg)  11/29/14 154 lb (69.854 kg)  09/20/14 147 lb (66.679 kg)    Physical Exam  Constitutional: She is oriented to person, place, and time. She appears well-developed and well-nourished. No distress.  HENT:  Head: Normocephalic and atraumatic.  Right Ear: Hearing normal.   Left Ear: Hearing normal.  Nose: Nose normal.  Eyes: Conjunctivae and lids are normal. Right eye exhibits no discharge. Left eye exhibits no discharge. No scleral icterus.  Neck: Normal range of motion. Neck supple. No JVD present. No tracheal deviation present. No thyromegaly present.  Cardiovascular: Normal rate, regular rhythm and intact distal pulses.  Exam reveals no gallop and no friction rub.   Murmur heard.  Systolic murmur is present with a grade of 4/6  Pulmonary/Chest: Effort normal. No stridor. No respiratory distress. She has no wheezes. She has rales in the right middle field, the right lower field, the left middle field and the left lower field. She exhibits no tenderness.  Musculoskeletal:  3+ edema to the knees bilaterally  Lymphadenopathy:    She has no cervical adenopathy.  Neurological: She is alert and oriented to person, place, and time.  Skin: Skin is warm and intact. No rash noted. No erythema. There is pallor.  Psychiatric: She has a normal mood and affect. Her speech is normal and behavior is normal. Judgment and thought content normal. Cognition and memory are normal.  Nursing note and vitals reviewed.   Results for orders placed or performed in visit on 01/02/15  CBC With Differential/Platelet  Result Value Ref Range   WBC 10.0 3.4 - 10.8 x10E3/uL   RBC 4.90 3.77 - 5.28 x10E6/uL   Hemoglobin 12.8 11.1 - 15.9 g/dL   Hematocrit 41.5 34.0 -  46.6 %   MCV 85 79 - 97 fL   MCH 26.1 (L) 26.6 - 33.0 pg   MCHC 30.8 (L) 31.5 - 35.7 g/dL   RDW 16.7 (H) 12.3 - 15.4 %   Platelets 250 150 - 379 x10E3/uL   Neutrophils 74 %   Lymphs 17 %   MID 8 %   Neutrophils Absolute 7.5 (H) 1.4 - 7.0 x10E3/uL   Lymphocytes Absolute 1.7 0.7 - 3.1 x10E3/uL   MID (Absolute) 0.8 0.1 - 1.6 X10E3/uL  TSH  Result Value Ref Range   TSH 10.650 (H) 0.450 - 4.500 uIU/mL  T3  Result Value Ref Range   T3, Total 86 71 - 180 ng/dL  T4  Result Value Ref Range   T4, Total 5.8 4.5 - 12.0  ug/dL      Assessment & Plan:   Problem List Items Addressed This Visit    None    Visit Diagnoses    Congestive heart failure, unspecified congestive heart failure chronicity, unspecified congestive heart failure type ([place])    -  Primary    Patient with significant swelling and rales, needs to go to ER for evaluation. EMS called. Patient to be transferred to the hospital.     Immunization due        Flu and pneumovax given today.     Relevant Orders    Flu Vaccine QUAD 36+ mos IM (Completed)    Pneumococcal polysaccharide vaccine 23-valent greater than or equal to 2yo subcutaneous/IM (Completed)        Follow up plan: Return After hospitalization.

## 2015-02-01 NOTE — ED Notes (Addendum)
Lower extremity edema present. COBAN wrap to bilateral legs from cellulitis. Per daughter, the left leg has cleared up, but they still keep it wrapped to help with swelling. Pt was initially going to [facility] but has home health RN now.  Since edema has increased patient has had increased difficulty with ambulating, but prior to was ambulating with minimal assistance.

## 2015-02-01 NOTE — Progress Notes (Signed)
[name] ([id number]) Visit Report for 01/31/2015 Arrival Information Details Patient Name: [name], [name]. Date of Service: 01/31/2015 10:45 AM Medical Record Number: [id number] Patient Account Number: [id number] Date of Birth/Sex: May 10, 1922 (81 y.o. Female) Treating RN: Carolyne Fiscal, Debi Primary Care Physician: Park Liter Other Clinician: Referring Physician: Park Liter Treating Physician/Extender: Frann Rider in Treatment: 20 Visit Information History Since Last Visit All ordered tests and consults were completed: No Patient Arrived: Wheel Chair Added or deleted any medications: No Arrival Time: 11:23 Any new allergies or adverse reactions: No Accompanied By: daughter Had a fall or experienced change in No Transfer Assistance: EasyP[employer] Patient activities of daily [name] that may affect Lift risk of falls: Patient Identification Verified: Yes Signs or symptoms of abuse/neglect since last No Secondary Verification Process Yes visito Completed: Hospitalized since last visit: No Patient Requires Transmission- No Pain Present Now: No Based Precautions: Patient Has Alerts: Yes Patient Alerts: Patient on Blood Thinner Eliquis. R ABI:0.96 Electronic Signature(s) Signed: 01/31/2015 5:40:37 PM By: Alric Quan Entered By: Alric Quan on 01/31/2015 11:25:27 [name] ([id number]) -------------------------------------------------------------------------------- Encounter Discharge Information Details Patient Name: [name] Date of Service: 01/31/2015 10:45 AM Medical Record Number: [id number] Patient Account Number: [id number] Date of Birth/Sex: 10/20/1922 (81 y.o. Female) Treating RN: Ahmed Prima Primary Care Physician: Park Liter Other Clinician: Referring Physician: Park Liter Treating Physician/Extender: Frann Rider in Treatment: 20 Encounter Discharge Information Items Discharge Pain Level:  0 Discharge Condition: Stable Ambulatory Status: Wheelchair Discharge Destination: Home Transportation: Private Auto Accompanied By: daughter Schedule Follow-up Appointment: Yes Medication Reconciliation completed and provided to Patient/Care Yes [name]: Provided on Clinical Summary of Care: 01/31/2015 Form Type Recipient Paper Patient LS Electronic Signature(s) Signed: 01/31/2015 12:19:28 PM By: Ruthine Dose Entered By: Ruthine Dose on 01/31/2015 12:19:28 [name] ([id number]) -------------------------------------------------------------------------------- Lower Extremity Assessment Details Patient Name: [name]. Date of Service: 01/31/2015 10:45 AM Medical Record Number: [id number] Patient Account Number: [id number] Date of Birth/Sex: Jun 20, 1922 (81 y.o. Female) Treating RN: Carolyne Fiscal, Debi Primary Care Physician: Park Liter Other Clinician: Referring Physician: Park Liter Treating Physician/Extender: Frann Rider in Treatment: 20 Edema Assessment Assessed: [Left: No] [Right: No] E[Left: dema] [Right: :] Calf Left: Right: Point of Measurement: cm From Medial Instep 37 cm 38 cm Ankle Left: Right: Point of Measurement: cm From Medial Instep 20.2 cm 22.5 cm Vascular Assessment Pulses: Posterior Tibial Palpable: [Left:No] [Right:No] Doppler: [Left:Monophasic] [Right:Monophasic] Dorsalis Pedis Palpable: [Left:No] [Right:No] Doppler: [Left:Monophasic] [Right:Monophasic] Extremity colors, hair growth, and conditions: Extremity Color: [Left:Normal] [Right:Normal] Hair Growth on Extremity: [Left:No] [Right:No] Temperature of Extremity: [Left:Warm] [Right:Warm] Capillary Refill: [Left:< 3 seconds] [Right:< 3 seconds] Toe Nail Assessment Left: Right: Thick: Yes Yes Discolored: Yes Yes Deformed: No No Improper Length and Hygiene: Yes Yes Electronic Signature(s) Signed: 01/31/2015 5:40:37 PM By: Nelly Laurence, Dolly Rias  ([id number]) Entered By: Alric Quan on 01/31/2015 12:07:17 [name] ([id number]) -------------------------------------------------------------------------------- Multi Wound Chart Details Patient Name: [name]. Date of Service: 01/31/2015 10:45 AM Medical Record Number: [id number] Patient Account Number: [id number] Date of Birth/Sex: 08-19-1922 (81 y.o. Female) Treating RN: Ahmed Prima Primary Care Physician: Park Liter Other Clinician: Referring Physician: Park Liter Treating Physician/Extender: Frann Rider in Treatment: 20 Vital Signs Height(in): 67 Pulse(bpm): 77 Weight(lbs): 159 Blood Pressure 101/55 (mmHg): Body Mass Index(BMI): 25 Temperature(F): Respiratory Rate 16 (breaths/min): Photos: [3:No Photos] [4:No Photos] [N/A:N/A] Wound Location: [3:Left Foot - Dorsal, Medial] [4:Left Foot - Dorsal, Lateral] [N/A:N/A] Wounding Event: [3:Pressure Injury] [4:Shear/Friction] [N/A:N/A] Primary Etiology: [3:Pressure Ulcer] [  4:Pressure Ulcer] [N/A:N/A] Comorbid History: [3:Cataracts, Arrhythmia, Hypertension, Peripheral Arterial Disease, Peripheral Venous Disease, Osteoarthritis, Confinement Anxiety] [4:Cataracts, Arrhythmia, Hypertension, Peripheral Arterial Disease, Peripheral Venous Disease,  Osteoarthritis, Confinement Anxiety] [N/A:N/A] Date Acquired: [3:11/12/2014] [4:11/12/2014] [N/A:N/A] Weeks of Treatment: [3:10] [4:10] [N/A:N/A] Wound Status: [3:Open] [4:Open] [N/A:N/A] Measurements L x W x D 0.4x0.8x0.2 [4:0.6x0.8x0.2] [N/A:N/A] (cm) Area (cm) : [3:0.251] [4:0.377] [N/A:N/A] Volume (cm) : [3:0.05] [4:0.075] [N/A:N/A] % Reduction in Area: [3:84.00%] [4:65.70%] [N/A:N/A] % Reduction in Volume: 68.20% [4:31.80%] [N/A:N/A] Classification: [3:Category/Stage III] [4:Category/Stage III] [N/A:N/A] Exudate Amount: [3:Medium] [4:Medium] [N/A:N/A] Exudate Type: [3:Serosanguineous] [4:Serous] [N/A:N/A] Exudate Color: [3:red, brown]  [4:amber] [N/A:N/A] Wound Margin: [3:Flat and Intact] [4:Flat and Intact] [N/A:N/A] Granulation Amount: [3:None Present (0%)] [4:None Present (0%)] [N/A:N/A] Necrotic Amount: [3:Large (67-100%)] [4:Large (67-100%)] [N/A:N/A] Exposed Structures: [3:Fascia: No Fat: No Tendon: No Muscle: No] [4:Fascia: No Fat: No Tendon: No Muscle: No] [N/A:N/A] Joint: No Joint: No Bone: No Bone: No Limited to Skin Limited to Skin Breakdown Breakdown Epithelialization: None None N/A Periwound Skin Texture: Edema: No Edema: Yes N/A Excoriation: No Excoriation: No Induration: No Induration: No Callus: No Callus: No Crepitus: No Crepitus: No Fluctuance: No Fluctuance: No Friable: No Friable: No Rash: No Rash: No Scarring: No Scarring: No Periwound Skin Moist: Yes Moist: Yes N/A Moisture: Maceration: No Maceration: No Dry/Scaly: No Dry/Scaly: No Periwound Skin Color: Atrophie Blanche: No Atrophie Blanche: No N/A Cyanosis: No Cyanosis: No Ecchymosis: No Ecchymosis: No Erythema: No Erythema: No Hemosiderin Staining: No Hemosiderin Staining: No Mottled: No Mottled: No Pallor: No Pallor: No Rubor: No Rubor: No Temperature: No Abnormality No Abnormality N/A Tenderness on No Yes N/A Palpation: Wound Preparation: Ulcer Cleansing: Other: Ulcer Cleansing: Other: N/A soap and water soap and water Topical Anesthetic Topical Anesthetic Applied: Other: lidocaine Applied: Other: lidocaine 4% 4% Treatment Notes Electronic Signature(s) Signed: 01/31/2015 5:40:37 PM By: Alric Quan Entered By: Alric Quan on 01/31/2015 11:54:23 [name] ([id number]) -------------------------------------------------------------------------------- Ider Details Patient Name: [name] Date of Service: 01/31/2015 10:45 AM Medical Record Number: [id number] Patient Account Number: [id number] Date of Birth/Sex: 03-10-1922 (81 y.o. Female) Treating RN: Carolyne Fiscal,  Debi Primary Care Physician: Park Liter Other Clinician: Referring Physician: Park Liter Treating Physician/Extender: Frann Rider in Treatment: 20 Active Inactive Abuse / Safety / Falls / Self Care Management Nursing Diagnoses: Potential for falls Goals: Patient will remain injury free Date Initiated: 09/07/2014 Goal Status: Active Interventions: Assess fall risk on admission and as needed Notes: Nutrition Nursing Diagnoses: Potential for alteratiion in Nutrition/Potential for imbalanced nutrition Goals: Patient/caregiver agrees to and verbalizes understanding of need to use nutritional supplements and/or vitamins as prescribed Date Initiated: 09/07/2014 Goal Status: Active Interventions: Assess patient nutrition upon admission and as needed per policy Notes: Orientation to the Wound Care Program Nursing Diagnoses: Knowledge deficit related to the wound healing center program Goals: Patient/caregiver will verbalize understanding of the [place], [place] ([id number]) Date Initiated: 09/07/2014 Goal Status: Active Interventions: Provide education on orientation to the wound center Notes: Wound/Skin Impairment Nursing Diagnoses: Impaired tissue integrity Goals: Patient/caregiver will verbalize understanding of skin care regimen Date Initiated: 09/07/2014 Goal Status: Active Ulcer/skin breakdown will heal within 14 weeks Date Initiated: 09/07/2014 Goal Status: Active Interventions: Assess patient/caregiver ability to obtain necessary supplies Assess ulceration(s) every visit Notes: Electronic Signature(s) Signed: 01/31/2015 5:40:37 PM By: Alric Quan Entered By: Alric Quan on 01/31/2015 11:54:15 Reif, Dolly Rias ([id number]) -------------------------------------------------------------------------------- Pain Assessment Details Patient Name: [name]. Date of Service: 01/31/2015 10:45 AM Medical Record  Number: [id number] Patient Account Number: [id number] Date of Birth/Sex: 04-04-1922 (81 y.o. Female) Treating RN: Ahmed Prima Primary Care Physician: Park Liter Other Clinician: Referring Physician: Park Liter Treating Physician/Extender: Frann Rider in Treatment: 20 Active Problems Location of Pain Severity and Description of Pain Patient Has Paino No Site Locations Pain Management and Medication Current Pain Management: Electronic Signature(s) Signed: 01/31/2015 5:40:37 PM By: Alric Quan Entered By: Alric Quan on 01/31/2015 11:25:34 [name] ([id number]) -------------------------------------------------------------------------------- Patient/Caregiver Education Details Patient Name: [name]. Date of Service: 01/31/2015 10:45 AM Medical Record Number: [id number] Patient Account Number: [id number] Date of Birth/Gender: [birth year] (81 y.o. Female) Treating RN: Ahmed Prima Primary Care Physician: Park Liter Other Clinician: Referring Physician: Park Liter Treating Physician/Extender: Frann Rider in Treatment: 20 Education Assessment Education Provided To: Patient and Caregiver Education Topics Provided Wound/Skin Impairment: Handouts: Other: do not get wraps wet Methods: Demonstration, Explain/Verbal Responses: State content correctly Electronic Signature(s) Signed: 01/31/2015 5:40:37 PM By: Alric Quan Entered By: Alric Quan on 01/31/2015 12:05:20 [name] ([id number]) -------------------------------------------------------------------------------- Wound Assessment Details Patient Name: [name]. Date of Service: 01/31/2015 10:45 AM Medical Record Number: [id number] Patient Account Number: [id number] Date of Birth/Sex: 1922-04-26 (81 y.o. Female) Treating RN: Carolyne Fiscal, Debi Primary Care Physician: Park Liter Other Clinician: Referring Physician: Park Liter Treating  Physician/Extender: Frann Rider in Treatment: 20 Wound Status Wound Number: 3 Primary Pressure Ulcer Etiology: Wound Location: Left Foot - Dorsal, Medial Wound Open Wounding Event: Pressure Injury Status: Date Acquired: 11/12/2014 Comorbid Cataracts, Arrhythmia, Hypertension, Weeks Of Treatment: 10 History: Peripheral Arterial Disease, Peripheral Clustered Wound: No Venous Disease, Osteoarthritis, Confinement Anxiety Photos Photo Uploaded By: Alric Quan on 01/31/2015 16:49:08 Wound Measurements Length: (cm) 0.4 Width: (cm) 0.8 Depth: (cm) 0.2 Area: (cm) 0.251 Volume: (cm) 0.05 % Reduction in Area: 84% % Reduction in Volume: 68.2% Epithelialization: None Tunneling: No Undermining: No Wound Description Classification: Category/Stage III Wound Margin: Flat and Intact Exudate Amount: Medium Exudate Type: Serosanguineous Exudate Color: red, brown Foul Odor After Cleansing: No Wound Bed Granulation Amount: None Present (0%) Exposed Structure Necrotic Amount: Large (67-100%) Fascia Exposed: No Necrotic Quality: Adherent Slough Fat Layer Exposed: No [place], Kahli L. ([id number]) Tendon Exposed: No Muscle Exposed: No Joint Exposed: No Bone Exposed: No Limited to Skin Breakdown Periwound Skin Texture Texture Color No Abnormalities Noted: No No Abnormalities Noted: No Callus: No Atrophie Blanche: No Crepitus: No Cyanosis: No Excoriation: No Ecchymosis: No Fluctuance: No Erythema: No Friable: No Hemosiderin Staining: No Induration: No Mottled: No Localized Edema: No Pallor: No Rash: No Rubor: No Scarring: No Temperature / Pain Moisture Temperature: No Abnormality No Abnormalities Noted: No Dry / Scaly: No Maceration: No Moist: Yes Wound Preparation Ulcer Cleansing: Other: soap and water, Topical Anesthetic Applied: Other: lidocaine 4%, Treatment Notes Wound #3 (Left, Medial, Dorsal Foot) 1. Cleansed with: Clean wound with Normal  Saline 2. Anesthetic Topical Lidocaine 4% cream to wound bed prior to debridement 4. Dressing Applied: Aquacel Ag 5. Secondary Dressing Applied Foam 7. Secured with 3 Layer Compression System - Bilateral Electronic Signature(s) Signed: 01/31/2015 5:40:37 PM By: Alric Quan Entered By: Alric Quan on 01/31/2015 11:46:31 [name] ([id number]) -------------------------------------------------------------------------------- Wound Assessment Details Patient Name: [name]. Date of Service: 01/31/2015 10:45 AM Medical Record Number: [id number] Patient Account Number: [id number] Date of Birth/Sex: Feb 12, 1923 (81 y.o. Female) Treating RN: Ahmed Prima Primary Care Physician: Park Liter Other Clinician: Referring Physician: Park Liter Treating Physician/Extender: Frann Rider in Treatment: 20 Wound Status Wound Number: 4  Primary Pressure Ulcer Etiology: Wound Location: Left Foot - Dorsal, Lateral Wound Open Wounding Event: Shear/Friction Status: Date Acquired: 11/12/2014 Comorbid Cataracts, Arrhythmia, Hypertension, Weeks Of Treatment: 10 History: Peripheral Arterial Disease, Peripheral Clustered Wound: No Venous Disease, Osteoarthritis, Confinement Anxiety Photos Photo Uploaded By: Alric Quan on 01/31/2015 16:49:08 Wound Measurements Length: (cm) 0.6 Width: (cm) 0.8 Depth: (cm) 0.2 Area: (cm) 0.377 Volume: (cm) 0.075 % Reduction in Area: 65.7% % Reduction in Volume: 31.8% Epithelialization: None Tunneling: No Undermining: No Wound Description Classification: Category/Stage III Wound Margin: Flat and Intact Exudate Amount: Medium Exudate Type: Serous Exudate Color: amber Foul Odor After Cleansing: No Wound Bed Granulation Amount: None Present (0%) Exposed Structure Necrotic Amount: Large (67-100%) Fascia Exposed: No Necrotic Quality: Adherent Slough Fat Layer Exposed: No [place], Amya L. ([id number]) Tendon  Exposed: No Muscle Exposed: No Joint Exposed: No Bone Exposed: No Limited to Skin Breakdown Periwound Skin Texture Texture Color No Abnormalities Noted: No No Abnormalities Noted: No Callus: No Atrophie Blanche: No Crepitus: No Cyanosis: No Excoriation: No Ecchymosis: No Fluctuance: No Erythema: No Friable: No Hemosiderin Staining: No Induration: No Mottled: No Localized Edema: Yes Pallor: No Rash: No Rubor: No Scarring: No Temperature / Pain Moisture Temperature: No Abnormality No Abnormalities Noted: No Tenderness on Palpation: Yes Dry / Scaly: No Maceration: No Moist: Yes Wound Preparation Ulcer Cleansing: Other: soap and water, Topical Anesthetic Applied: Other: lidocaine 4%, Treatment Notes Wound #4 (Left, Lateral, Dorsal Foot) 1. Cleansed with: Clean wound with Normal Saline 2. Anesthetic Topical Lidocaine 4% cream to wound bed prior to debridement 4. Dressing Applied: Aquacel Ag 5. Secondary Dressing Applied Foam 7. Secured with 3 Layer Compression System - Bilateral Electronic Signature(s) Signed: 01/31/2015 5:40:37 PM By: Alric Quan Entered By: Alric Quan on 01/31/2015 11:47:23 [name] ([id number]) -------------------------------------------------------------------------------- Wound Assessment Details Patient Name: [name]. Date of Service: 01/31/2015 10:45 AM Medical Record Number: [id number] Patient Account Number: [id number] Date of Birth/Sex: 22-Jul-1922 (81 y.o. Female) Treating RN: Carolyne Fiscal, Debi Primary Care Physician: Park Liter Other Clinician: Referring Physician: Park Liter Treating Physician/Extender: Frann Rider in Treatment: 20 Wound Status Wound Number: 5 Primary To be determined Etiology: Wound Location: Left Toe - Web between 4th and 5th - Medial Wound Open Status: Wounding Event: Gradually Appeared Comorbid Cataracts, Arrhythmia, Hypertension, Date Acquired:  01/31/2015 History: Peripheral Arterial Disease, Peripheral Weeks Of Treatment: 0 Venous Disease, Osteoarthritis, Clustered Wound: No Confinement Anxiety Photos Photo Uploaded By: Alric Quan on 01/31/2015 16:49:09 Wound Measurements Length: (cm) 0.3 Width: (cm) 0.3 Depth: (cm) 0.2 Area: (cm) 0.071 Volume: (cm) 0.014 % Reduction in Area: % Reduction in Volume: Epithelialization: None Tunneling: No Undermining: No Wound Description Classification: Partial Thickness Wound Margin: Flat and Intact Exudate Amount: Medium Exudate Type: Serosanguineous Exudate Color: red, brown Foul Odor After Cleansing: No Wound Bed Granulation Amount: Small (1-33%) Exposed Structure Granulation Quality: Red Fascia Exposed: No Necrotic Amount: Large (67-100%) Fat Layer Exposed: No B[place], Sapphire L. ([id number]) Necrotic Quality: Adherent Slough Tendon Exposed: No Muscle Exposed: No Joint Exposed: No Bone Exposed: No Limited to Skin Breakdown Periwound Skin Texture Texture Color No Abnormalities Noted: No No Abnormalities Noted: No Localized Edema: Yes Moisture No Abnormalities Noted: No Moist: Yes Wound Preparation Ulcer Cleansing: Rinsed/Irrigated with Saline Topical Anesthetic Applied: Other: lidocaine 4%, Treatment Notes Wound #5 (Left, Medial Toe - Web between 4th and 5th) 1. Cleansed with: Clean wound with Normal Saline 2. Anesthetic Topical Lidocaine 4% cream to wound bed prior to debridement 4. Dressing Applied: Aquacel Ag 5. Secondary  Dressing Applied Foam 7. Secured with 3 Layer Compression System - Bilateral Electronic Signature(s) Signed: 01/31/2015 5:40:37 PM By: Alric Quan Entered By: Alric Quan on 01/31/2015 12:00:40 [name] ([id number]) -------------------------------------------------------------------------------- Vitals Details Patient Name: [name] Date of Service: 01/31/2015 10:45 AM Medical Record Number:  [id number] Patient Account Number: [id number] Date of Birth/Sex: 08/25/1922 (81 y.o. Female) Treating RN: Carolyne Fiscal, Debi Primary Care Physician: Park Liter Other Clinician: Referring Physician: Park Liter Treating Physician/Extender: Frann Rider in Treatment: 20 Vital Signs Time Taken: 11:25 Pulse (bpm): 77 Height (in): 67 Respiratory Rate (breaths/min): 16 Weight (lbs): 159 Blood Pressure (mmHg): 101/55 Body Mass Index (BMI): 24.9 Reference Range: 80 - 120 mg / dl Electronic Signature(s) Signed: 01/31/2015 5:40:37 PM By: Alric Quan Entered By: Alric Quan on 01/31/2015 11:29:08

## 2015-02-01 NOTE — ED Provider Notes (Addendum)
[facility] Emergency Department Provider Note     Time seen: ----------------------------------------- 9:25 AM on 02/01/2015 -----------------------------------------    I have reviewed the triage vital signs and the nursing notes.   HISTORY  Chief Complaint No chief complaint on file.    HPI [name] is a 81 y.o. female who presents ER for some shortness of breath and increased edema. She was sent from her doctor's office concerning the patient's swelling and the possible need for diuresis. Patient was not having any complaints at this time, states she's had occasional shortness of breath. Nothing makes her symptoms better or worse. It is unclear how long the symptoms have been going on.   Past Medical History  Diagnosis Date  . Type II or unspecified type diabetes mellitus without mention of complication, not stated as uncontrolled   . Essential hypertension, benign   . Anemia, unspecified   . Atrial fibrillation ([place])   . Hypercholesterolemia   . Osteoarthritis   . History of TIAs   . CVA (cerebral infarction)   . Atrial fibrillation ([place])   . Uterine cancer (HCC)     s/p TAH/BSO  . CAD (coronary artery disease)     s/p CABG 10/99 with LIMA to the LAD, SVG to OM1, SVG to distal RCA  . Cellulitis     Patient Active Problem List   Diagnosis Date Noted  . Hypothyroidism 12/03/2014  . Elevated TSH 12/03/2014  . Thrombocytopenia ([place]) 11/30/2014  . Cellulitis 08/14/2014  . Cellulitis of left leg 08/13/2014  . Rash 07/02/2014  . Edema 01/28/2014  . Toenail fungus 06/25/2013  . CVA (cerebral vascular accident) ([place]) 04/02/2013  . ICH (intracerebral hemorrhage) on anticoagulation, after recent LPCA infarct 01/26/2013  . Neuropathy ([place]) 12/18/2012  . Hyponatremia 06/19/2012  . Atrial fibrillation ([place]) 06/17/2012  . Essential hypertension, benign 06/17/2012  . CAD (coronary artery disease) 06/17/2012  . Peripheral vascular disease  ([place]) 06/17/2012  . Diabetes ([place]) 06/17/2012  . Hypercholesterolemia 06/17/2012  . Depression 06/17/2012    Past Surgical History  Procedure Laterality Date  . Total abdominal hysterectomy w/ bilateral salpingoophorectomy  1969  . Coronary artery bypass graft  1999    x4  . Cataract extraction      Right eye  . Peripheral vascular catheterization N/A 09/20/2014    Procedure: Lower Extremity Angiography;  Surgeon: Algernon Huxley, MD;  Location: [place] [facility];  Service: Cardiovascular;  Laterality: N/A;  . Peripheral vascular catheterization  09/20/2014    Procedure: Lower Extremity Intervention;  Surgeon: Algernon Huxley, MD;  Location: [place] [facility];  Service: Cardiovascular;;    Allergies Erythromycin ethylsuccinate and Mycinette  Social History Social History  Substance Use Topics  . Smoking status: Never Smoker   . Smokeless tobacco: Never Used  . Alcohol Use: No    Review of Systems Constitutional: Negative for fever. Eyes: Negative for visual changes. ENT: Negative for sore throat. Cardiovascular: Negative for chest pain. Respiratory: Positive for shortness of breath Gastrointestinal: Negative for abdominal pain, vomiting and diarrhea. Genitourinary: Negative for dysuria. Musculoskeletal: Negative for back pain. Positive for leg swelling Skin: Positive for wounds on her legs Neurological: Negative for headaches, focal weakness or numbness.  10-point ROS otherwise negative.  ____________________________________________   PHYSICAL EXAM:  VITAL SIGNS: ED Triage Vitals  Enc Vitals Group     BP --      Pulse --      Resp --      Temp --  Temp src --      SpO2 --      Weight --      Height --      Head Cir --      Peak Flow --      Pain Score --      Pain Loc --      Pain Edu? --      Excl. in [place]? --     Constitutional: Alert and oriented. Well appearing and in no distress. Eyes: Conjunctivae are normal. PERRL. Normal extraocular  movements. ENT   Head: Normocephalic and atraumatic.   Nose: No congestion/rhinnorhea.   Mouth/Throat: Mucous membranes are moist.   Neck: No stridor. Cardiovascular: Normal rate, regular rhythm. Normal and symmetric distal pulses are present in all extremities. No murmurs, rubs, or gallops. Respiratory: Normal respiratory effort without tachypnea nor retractions. Mild basilar rales Gastrointestinal: Soft and nontender. No distention. No abdominal bruits.  Musculoskeletal: Nontender with normal range of motion in all extremities. No joint effusions.  No lower extremity tenderness, bilateral pitting edema right greater than left Neurologic:  Normal speech and language. No gross focal neurologic deficits are appreciated. Speech is normal.  Skin:  Skin is warm, dry and intact. Superficial wounds are noted in the right lower extremity Psychiatric: Mood and affect are normal. Speech and behavior are normal. ____________________________________________  EKG: Interpreted by me. Atrial fibrillation with a rate of 96 bpm  normal QRS with, normal QT interval. Possible left posterior fascicular block. ST and T-wave changes. ____________________________________________  ED COURSE:  Pertinent labs & imaging results that were available during my care of the patient were reviewed by me and considered in my medical decision making (see chart for details). We will check basic labs, likely chest x-ray and Doppler ultrasound. ____________________________________________    LABS (pertinent positives/negatives)  Labs Reviewed  CBC WITH DIFFERENTIAL/PLATELET - Abnormal; Notable for the following:    RBC 5.27 (*)    MCH 24.6 (*)    MCHC 30.6 (*)    RDW 17.1 (*)    Neutro Abs 7.5 (*)    Monocytes Absolute 1.1 (*)    All other components within normal limits  BRAIN NATRIURETIC PEPTIDE - Abnormal; Notable for the following:    B Natriuretic Peptide 3310.0 (*)    All other components within  normal limits  BASIC METABOLIC PANEL  TROPONIN I                 MCHC 30.6 (*)    RDW 17.1 (*)    Neutro Abs 7.5 (*)    Monocytes Absolute 1.1 (*)    All other components within normal limits  BRAIN NATRIURETIC PEPTIDE - Abnormal; Notable for the following:    B Natriuretic Peptide 3310.0 (*)    All other components within normal limits  BASIC METABOLIC PANEL  TROPONIN I   CRITICAL CARE Performed by: Earleen Newport   Total critical care time: 30 minutes  Critical care time was exclusive of separately billable procedures and treating other patients.  Critical care was necessary to treat or prevent imminent or life-threatening deterioration.  Critical care was time spent personally by me on the following activities: development of treatment plan with patient and/or surrogate as well as nursing, discussions with consultants, evaluation of patient's response to treatment, examination of patient, obtaining history from patient or surrogate, ordering and performing treatments and interventions, ordering and review of laboratory studies, ordering and review of radiographic studies, pulse oximetry and re-evaluation of  patient's condition.   RADIOLOGY Images were viewed by me  Chest x-ray, ultrasound extremities IMPRESSION: Chronic cardiomegaly with small to moderate bilateral pleural effusions, new since 2015. No acute edema.  No evidence of DVT in the lower extremities. ____________________________________________  FINAL ASSESSMENT AND PLAN  Dyspnea, CHF, elevated troponin  Plan: Patient with labs and imaging as dictated above. I will place a Foley catheter to facilitate diuresis and prevent her falling during diuresis. To benefit from admission, serial troponins and diuresis in the hospital.   Earleen Newport, MD   Earleen Newport, MD 02/01/15 1112  Earleen Newport, MD 02/01/15 [phone]

## 2015-02-01 NOTE — H&P (Signed)
[facility] [place] NAME: [name]    MR#:  [id number]  DATE OF BIRTH:  Nov 19, 1922  DATE OF ADMISSION:  02/01/2015  PRIMARY CARE PHYSICIAN: Park Liter, DO   REQUESTING/REFERRING PHYSICIAN: Dr. Lenise Arena  CHIEF COMPLAINT:   Chief Complaint  Patient presents with  . Leg Swelling  . Altered Mental Status    HISTORY OF PRESENT ILLNESS:  [name]  is a 81 y.o. female with a known history of HTN, CAD s/p CABG, Afib and CVA on eliquis, chronic lower extremity edema as to the hospital secondary to worsening edema of her lower extremities extending into her thighs. Also her breathing got worse in the last couple of days. She has had trouble with lower extremity ulcers going on almost perfume months now. Following up with the wound care clinic. Legs are being wrapped and that's helping with the edema, however no edema spreading into the thighs. Ulcers are healing according to recent visit with the wound care nurse. Denies any cough. Patient has been fatigued lately per PCP notes and increased somnolence. No fevers or chills or nausea or vomiting. No productive cough.  PAST MEDICAL HISTORY:   Past Medical History  Diagnosis Date  . Type II or unspecified type diabetes mellitus without mention of complication, not stated as uncontrolled   . Essential hypertension, benign   . Anemia, unspecified   . Atrial fibrillation ([place])   . Hypercholesterolemia   . Osteoarthritis   . History of TIAs   . CVA (cerebral infarction)   . Atrial fibrillation ([place])   . Uterine cancer (HCC)     s/p TAH/BSO  . CAD (coronary artery disease)     s/p CABG 10/99 with LIMA to the LAD, SVG to OM1, SVG to distal RCA  . Cellulitis   . Lymphedema   . Chronic venous insufficiency     PAST SURGICAL HISTORY:   Past Surgical History  Procedure Laterality Date  . Total abdominal hysterectomy w/ bilateral salpingoophorectomy  1969  . Coronary  artery bypass graft  1999    x4  . Cataract extraction      Right eye  . Peripheral vascular catheterization N/A 09/20/2014    Procedure: Lower Extremity Angiography;  Surgeon: Algernon Huxley, MD;  Location: [place] [facility];  Service: Cardiovascular;  Laterality: N/A;  . Peripheral vascular catheterization  09/20/2014    Procedure: Lower Extremity Intervention;  Surgeon: Algernon Huxley, MD;  Location: [place];  Service: Cardiovascular;;    SOCIAL HISTORY:   Social History  Substance Use Topics  . Smoking status: Never Smoker   . Smokeless tobacco: Never Used  . Alcohol Use: No    FAMILY HISTORY:   Family History  Problem Relation Age of Onset  . Heart disease Mother 43    Died of heart attack  . Stroke Father 56    died of cva  . CAD Brother     multiple brothers    DRUG ALLERGIES:   Allergies  Allergen Reactions  . Erythromycin Ethylsuccinate Other (See Comments)    REACTION:  unknown  . Mycinette [Phenol] Other (See Comments)    REACTION:  unknown    REVIEW OF SYSTEMS:   Review of Systems  Constitutional: Positive for malaise/fatigue. Negative for fever, chills and weight loss.  HENT: Negative for ear discharge, ear pain, hearing loss, nosebleeds and tinnitus.   Eyes: Negative for blurred vision, double vision and photophobia.  Respiratory: Positive for shortness of breath. Negative for cough, hemoptysis and wheezing.   Cardiovascular: Positive for leg swelling. Negative for chest pain, palpitations and orthopnea.  Gastrointestinal: Negative for nausea, vomiting, abdominal pain, diarrhea, constipation and melena.  Genitourinary: Negative for dysuria, urgency, frequency and hematuria.  Musculoskeletal: Negative for myalgias, back pain and neck pain.  Skin: Negative for rash.  Neurological: Negative for dizziness, tingling, tremors, sensory change, speech change, focal weakness and headaches.  Endo/Heme/Allergies: Does not bruise/bleed easily.   Psychiatric/Behavioral: Negative for depression.    MEDICATIONS AT HOME:   Prior to Admission medications   Medication Sig Start Date End Date Taking? Authorizing Provider  ALPRAZolam (XANAX) 0.25 MG tablet Take 0.25 mg by mouth daily as needed. 11/02/14  Yes Historical Provider, MD  apixaban (ELIQUIS) 2.5 MG TABS tablet Take 1 tablet (2.5 mg total) by mouth 2 (two) times daily. 12/17/14  Yes Megan P Johnson, DO  bacitracin-polymyxin b (POLYSPORIN) ophthalmic ointment Place 1 application into the right eye 2 (two) times daily. apply to eye every 12 hours while awake 01/02/15  Yes Megan P Johnson, DO  citalopram (CELEXA) 20 MG tablet Take 1 tablet (20 mg total) by mouth daily. 01/02/15  Yes Megan P Johnson, DO  docusate sodium (COLACE) 100 MG capsule Take 100 mg by mouth at bedtime as needed for mild constipation.   Yes Historical Provider, MD  furosemide (LASIX) 40 MG tablet Take 1 tablet (40 mg total) by mouth daily. 11/29/14  Yes Megan P Johnson, DO  levothyroxine (SYNTHROID, LEVOTHROID) 25 MCG tablet Take 1 tablet (25 mcg total) by mouth daily before breakfast. 01/03/15  Yes Megan P Johnson, DO  lidocaine (LIDODERM) 5 % Place 1 patch onto the skin daily. Remove & Discard patch within 12 hours or as directed by MD 11/29/14  Yes Megan P Johnson, DO  lisinopril (PRINIVIL,ZESTRIL) 20 MG tablet Take 1 tablet (20 mg total) by mouth every morning. 01/02/15  Yes Megan P Johnson, DO  metoprolol tartrate (LOPRESSOR) 25 MG tablet Take 1.5 tablets (37.5 mg total) by mouth 2 (two) times daily. Patient taking differently: Take 25 mg by mouth 2 (two) times daily.  12/17/14  Yes Megan P Johnson, DO  mineral oil-hydrophilic petrolatum (AQUAPHOR) ointment Apply 1 application topically daily. Apply to feet/lower legs   Yes Historical Provider, MD  potassium chloride (MICRO-K) 10 MEQ CR capsule Take 1 capsule (10 mEq total) by mouth daily. 11/29/14  Yes Megan P Johnson, DO      VITAL SIGNS:  Blood pressure  121/66, pulse 25, temperature 97.4 F (36.3 C), temperature source Oral, resp. rate 24, height 5\' 6"  (1.676 m), weight 75.297 kg (166 lb), SpO2 94 %.  PHYSICAL EXAMINATION:   Physical Exam  GENERAL:  81 y.o.-year-old patient lying in the bed with no acute distress. Appears very fatigued and weak. EYES: Pupils equal, round, reactive to light and accommodation. No scleral icterus. Extraocular muscles intact.  HEENT: Head atraumatic, normocephalic. Oropharynx and nasopharynx clear.  NECK:  Supple, no jugular venous distention. No thyroid enlargement, no tenderness.  LUNGS: Normal breath sounds bilaterally, no wheezing,rhonchi. Scattered rales occasionally. No use of accessory muscles of respiration.  CARDIOVASCULAR: S1, S2 normal. No rubs, or gallops. 3/6 systolic murmur heard. ABDOMEN: Soft, nontender, nondistended. Bowel sounds present. No organomegaly or mass.  EXTREMITIES: 4+ edema up to the thighs, lower legs compressed with ACE wraps. No cyanosis, or clubbing.  NEUROLOGIC: Cranial nerves II through XII are intact. Muscle strength 5/5 in all extremities. Sensation intact.  Gait not checked. Generalized weakness. PSYCHIATRIC: The patient is alert and oriented x 3.  SKIN: No obvious rash, lesion, or ulcer.   LABORATORY PANEL:   CBC  Recent Labs Lab 02/01/15 0944  WBC 10.0  HGB 13.0  HCT 42.4  PLT 247   ------------------------------------------------------------------------------------------------------------------  Chemistries   Recent Labs Lab 02/01/15 0944  NA 139  K 4.9  CL 102  CO2 27  GLUCOSE 215*  BUN 50*  CREATININE 1.34*  CALCIUM 10.0   ------------------------------------------------------------------------------------------------------------------  Cardiac Enzymes  Recent Labs Lab 02/01/15 0944  TROPONINI 0.15*   ------------------------------------------------------------------------------------------------------------------  RADIOLOGY:  US Venous  Img Lower Bilateral  02/01/2015  CLINICAL DATA:  Bilateral lower extremity edema EXAM: BILATERAL LOWER EXTREMITY VENOUS DUPLEX ULTRASOUND TECHNIQUE: Gray-scale sonography with graded compression, as well as color Doppler and duplex ultrasound were performed to evaluate the lower extremity deep venous systems from the level of the common femoral vein and including the common femoral, femoral, profunda femoral, popliteal and calf veins including the posterior tibial, peroneal and gastrocnemius veins when visible. The superficial great saphenous vein was also interrogated. Spectral Doppler was utilized to evaluate flow at rest and with distal augmentation maneuvers in the common femoral, femoral and popliteal veins. COMPARISON:  August 13, 2014 FINDINGS: RIGHT LOWER EXTREMITY Common Femoral Vein: No evidence of thrombus. Normal compressibility, respiratory phasicity and response to augmentation. Saphenofemoral Junction: No evidence of thrombus. Normal compressibility and flow on color Doppler imaging. Profunda Femoral Vein: No evidence of thrombus. Normal compressibility and flow on color Doppler imaging. Femoral Vein: No evidence of thrombus. Normal compressibility, respiratory phasicity and response to augmentation. Popliteal Vein: No evidence of thrombus. Normal compressibility, respiratory phasicity and response to augmentation. Calf Veins: Limited visualization due to edema. No evidence of thrombus. Normal compressibility and flow on color Doppler imaging. Superficial Great Saphenous Vein: No evidence of thrombus. Normal compressibility and flow on color Doppler imaging. Venous Reflux:  None. Other Findings: Flow is pulsatile throughout the visualized venous structures. There is a fluid-filled structure in the right popliteal fossa measuring 2.5 x 0.7 cm. LEFT LOWER EXTREMITY Common Femoral Vein: No evidence of thrombus. Normal compressibility, respiratory phasicity and response to augmentation. Saphenofemoral  Junction: No evidence of thrombus. Normal compressibility and flow on color Doppler imaging. Profunda Femoral Vein: No evidence of thrombus. Normal compressibility and flow on color Doppler imaging. Femoral Vein: No evidence of thrombus. Normal compressibility, respiratory phasicity and response to augmentation. Popliteal Vein: No evidence of thrombus. Normal compressibility, respiratory phasicity and response to augmentation. Calf Veins: Not visualized due to edema. Superficial Great Saphenous Vein: No evidence of thrombus. Normal compressibility and flow on color Doppler imaging. Venous Reflux:  None. Other Findings:  Flow was pulsatile and all visualized regions. IMPRESSION: No evidence of deep venous thrombosis in either lower extremity. Note that there is limited calf vein visualization on the right and essentially complete absence of calf vein visualization on the left due to edema. Probable small Baker cyst right popliteal fossa region. Flow is pulsatile ungual regions, a finding that raises question of a degree of congestive heart failure. Electronically Signed   By: Lowella Grip III M.D.   On: 02/01/2015 11:00   Dg Chest Port 1 View  02/01/2015  CLINICAL DATA:  81 year old female with fall, lethargy, shortness of breath. Initial encounter. EXAM: PORTABLE CHEST 1 VIEW COMPARISON:  01/03/2014 and earlier. FINDINGS: Portable AP upright view at 0938 hours. Small to moderate bilateral pleural effusions are new, greater on the right. Chronic cardiomegaly.  Sequelae of CABG. Calcified aortic atherosclerosis. Other mediastinal contours are within normal limits. Small calcified right peritracheal lymph node. No pneumothorax or pulmonary edema. No consolidation identified. IMPRESSION: Chronic cardiomegaly with small to moderate bilateral pleural effusions, new since 2015. No acute edema. Electronically Signed   By: Genevie Ann M.D.   On: 02/01/2015 09:48    EKG:   Orders placed or performed during the  hospital encounter of 02/01/15  . ED EKG  . ED EKG    IMPRESSION AND PLAN:   [name]  is a 82 y.o. female with a known history of HTN, CAD s/p CABG, Afib and CVA on eliquis, chronic lower extremity edema as to the hospital secondary to worsening edema of her lower extremities extending into her thighs.  #1 Chronic lower extremity edema- CHF exacerbation and also chronic venous insufficiency - IV lasix bid, monitor renal function - wound care consult, no indication for ABX at this time.  #2 CHF- acute on chronic, unknown EF - ECHO, IV lasix - Follows with Dr. Nehemiah Massed - troponins elevated- likely demand ischemia- recycle, monitor on tele - no chest pain  #3 CAD s/p CABG- stable, cont home meds  #4 HTN- lisinopril metoprolol  #5 Afib with h/o CVA- on eliquis - on metoprolol for rate control  #6 DVT Prophylaxis- eliquis  Physical Therapy consulted as well    All the records are reviewed and case discussed with ED provider. Management plans discussed with the patient, family and they are in agreement.  CODE STATUS: Full Code  TOTAL TIME TAKING CARE OF THIS PATIENT: 50 minutes.    Gladstone Lighter M.D on 02/01/2015 at 12:50 PM  Between 7am to 6pm - Pager - [phone]  After 6pm go to [email or web address] - password [email or web address] [place] [facility]  Office  [phone]  CC: Primary care physician; Park Liter, DO

## 2015-02-01 NOTE — ED Notes (Signed)
Pt states "not feeling well", daughter took her to [facility] due to increased swelling and "being more Lethargic". Daughter and FP felt she needed to come to ED for further evaluation. Pt has hx of CHF and FP noted Rales in Mid/Lower lobes. No recent med changes per FP.

## 2015-02-01 NOTE — Progress Notes (Signed)
*  PRELIMINARY RESULTS* Echocardiogram 2D Echocardiogram has been performed.  [name] 02/01/2015, 3:29 PM

## 2015-02-01 NOTE — Consult Note (Signed)
WOC wound consult note Reason for Consult:Lower extremity wraps.  Wraps are in place and dated for 01/31/15,  Will change Monday, 02/04/15. [place] team will follow.  Domenic Moras RN BSN [place] Pager [phone]

## 2015-02-02 LAB — CBC
HCT: 37.9 % (ref 35.0–47.0)
HEMOGLOBIN: 11.9 g/dL — AB (ref 12.0–16.0)
MCH: 25 pg — AB (ref 26.0–34.0)
MCHC: 31.3 g/dL — ABNORMAL LOW (ref 32.0–36.0)
MCV: 79.8 fL — AB (ref 80.0–100.0)
PLATELETS: 201 10*3/uL (ref 150–440)
RBC: 4.75 MIL/uL (ref 3.80–5.20)
RDW: 16.8 % — ABNORMAL HIGH (ref 11.5–14.5)
WBC: 11.6 10*3/uL — AB (ref 3.6–11.0)

## 2015-02-02 LAB — BASIC METABOLIC PANEL
ANION GAP: 8 (ref 5–15)
BUN: 46 mg/dL — ABNORMAL HIGH (ref 6–20)
CHLORIDE: 105 mmol/L (ref 101–111)
CO2: 25 mmol/L (ref 22–32)
CREATININE: 1.39 mg/dL — AB (ref 0.44–1.00)
Calcium: 9.2 mg/dL (ref 8.9–10.3)
GFR calc non Af Amer: 32 mL/min — ABNORMAL LOW (ref >60)
GFR, EST AFRICAN AMERICAN: 37 mL/min — AB (ref >60)
Glucose, Bld: 152 mg/dL — ABNORMAL HIGH (ref 65–99)
POTASSIUM: 4.4 mmol/L (ref 3.5–5.1)
SODIUM: 138 mmol/L (ref 135–145)

## 2015-02-02 LAB — TROPONIN I: Troponin I: 0.14 ng/mL — ABNORMAL HIGH (ref <0.031)

## 2015-02-02 MED ORDER — AQUAPHOR EX OINT
TOPICAL_OINTMENT | Freq: Two times a day (BID) | CUTANEOUS | Status: DC
  Administered 2015-02-02 – 2015-02-05 (×6): via TOPICAL
  Filled 2015-02-02: qty 50

## 2015-02-02 MED ORDER — MORPHINE SULFATE (PF) 2 MG/ML IV SOLN: 1.0000 mg | INTRAVENOUS | Status: DC | PRN

## 2015-02-02 MED ORDER — DEXTROSE 5 % IV SOLN
1.0000 g | INTRAVENOUS | Status: DC
  Administered 2015-02-02: 1 g via INTRAVENOUS
  Filled 2015-02-02 (×2): qty 10

## 2015-02-02 MED ORDER — HYDROCERIN EX CREA
TOPICAL_CREAM | Freq: Two times a day (BID) | CUTANEOUS | Status: DC
  Filled 2015-02-02: qty 113

## 2015-02-02 MED ORDER — HYDROCODONE-ACETAMINOPHEN 5-325 MG PO TABS
1.0000 | ORAL_TABLET | ORAL | Status: DC | PRN
  Administered 2015-02-02 – 2015-02-05 (×7): 1 via ORAL
  Filled 2015-02-02 (×7): qty 1

## 2015-02-02 NOTE — Progress Notes (Signed)
[facility] [place] NAME: [name]    MR#:  [id number]  DATE OF BIRTH:  1923-01-11  SUBJECTIVE:  CHIEF COMPLAINT:   Chief Complaint  Patient presents with  . Leg Swelling  . Altered Mental Status   - Gentle admitted with bilateral lower extremity edema and also dyspnea. Noted to have CHF exacerbation. -complaining of significant pain in her left leg that has been wrapped.  REVIEW OF SYSTEMS:  Review of Systems  Constitutional: Negative for fever and chills.  HENT: Positive for hearing loss. Negative for ear discharge, ear pain and nosebleeds.   Respiratory: Negative for cough, shortness of breath and wheezing.   Cardiovascular: Negative for chest pain and palpitations.  Gastrointestinal: Negative for nausea, vomiting, abdominal pain, diarrhea and constipation.  Genitourinary: Negative for dysuria.  Musculoskeletal: Positive for myalgias.       Complaints of left leg pain  Skin: Negative for rash.       Dry skin noted  Neurological: Positive for weakness. Negative for dizziness, speech change, focal weakness, seizures and headaches.  Psychiatric/Behavioral: Negative for depression.    DRUG ALLERGIES:   Allergies  Allergen Reactions  . Erythromycin Ethylsuccinate Other (See Comments)    REACTION:  unknown  . Mycinette [Phenol] Other (See Comments)    REACTION:  unknown    VITALS:  Blood pressure 104/61, pulse 97, temperature 98 F (36.7 C), temperature source Oral, resp. rate 18, height 5\' 6"  (1.676 m), weight 68.448 kg (150 lb 14.4 oz), SpO2 95 %.  PHYSICAL EXAMINATION:  Physical Exam  GENERAL: 81 y.o.-year-old patient lying in the bed with no acute distress. Appears very fatigued and weak. EYES: Pupils equal, round, reactive to light and accommodation. No scleral icterus. Extraocular muscles intact.  HEENT: Head atraumatic, normocephalic. Oropharynx and nasopharynx clear.  NECK: Supple, no jugular venous  distention. No thyroid enlargement, no tenderness.  LUNGS: Normal breath sounds bilaterally, no wheezing,rhonchi. Scattered rales occasionally. No use of accessory muscles of respiration.  CARDIOVASCULAR: S1, S2 normal. No rubs, or gallops. 3/6 systolic murmur heard. ABDOMEN: Soft, nontender, nondistended. Bowel sounds present. No organomegaly or mass.  EXTREMITIES: 2+ edema up to the thighs, lower legs compressed with ACE wraps-in the Ace wrap was taken out, no edema noted. Skin is extremely dry. There's a superficial abrasion on the dorsal side of the left ankle.Marland Kitchen No cyanosis, or clubbing.  NEUROLOGIC: Cranial nerves II through XII are intact. Muscle strength 5/5 in all extremities. Sensation intact. Gait not checked. Generalized weakness. PSYCHIATRIC: The patient is alert and oriented x 3.  SKIN: No obvious rash, lesion, or ulcer.     LABORATORY PANEL:   CBC  Recent Labs Lab 02/02/15 0142  WBC 11.6*  HGB 11.9*  HCT 37.9  PLT 201   ------------------------------------------------------------------------------------------------------------------  Chemistries   Recent Labs Lab 02/02/15 0142  NA 138  K 4.4  CL 105  CO2 25  GLUCOSE 152*  BUN 46*  CREATININE 1.39*  CALCIUM 9.2   ------------------------------------------------------------------------------------------------------------------  Cardiac Enzymes  Recent Labs Lab 02/02/15 0142  TROPONINI 0.14*   ------------------------------------------------------------------------------------------------------------------  RADIOLOGY:  US Venous Img Lower Bilateral  02/01/2015  CLINICAL DATA:  Bilateral lower extremity edema EXAM: BILATERAL LOWER EXTREMITY VENOUS DUPLEX ULTRASOUND TECHNIQUE: Gray-scale sonography with graded compression, as well as color Doppler and duplex ultrasound were performed to evaluate the lower extremity deep venous systems from the level of the common femoral vein and including the common  femoral, femoral, profunda femoral, popliteal  and calf veins including the posterior tibial, peroneal and gastrocnemius veins when visible. The superficial great saphenous vein was also interrogated. Spectral Doppler was utilized to evaluate flow at rest and with distal augmentation maneuvers in the common femoral, femoral and popliteal veins. COMPARISON:  August 13, 2014 FINDINGS: RIGHT LOWER EXTREMITY Common Femoral Vein: No evidence of thrombus. Normal compressibility, respiratory phasicity and response to augmentation. Saphenofemoral Junction: No evidence of thrombus. Normal compressibility and flow on color Doppler imaging. Profunda Femoral Vein: No evidence of thrombus. Normal compressibility and flow on color Doppler imaging. Femoral Vein: No evidence of thrombus. Normal compressibility, respiratory phasicity and response to augmentation. Popliteal Vein: No evidence of thrombus. Normal compressibility, respiratory phasicity and response to augmentation. Calf Veins: Limited visualization due to edema. No evidence of thrombus. Normal compressibility and flow on color Doppler imaging. Superficial Great Saphenous Vein: No evidence of thrombus. Normal compressibility and flow on color Doppler imaging. Venous Reflux:  None. Other Findings: Flow is pulsatile throughout the visualized venous structures. There is a fluid-filled structure in the right popliteal fossa measuring 2.5 x 0.7 cm. LEFT LOWER EXTREMITY Common Femoral Vein: No evidence of thrombus. Normal compressibility, respiratory phasicity and response to augmentation. Saphenofemoral Junction: No evidence of thrombus. Normal compressibility and flow on color Doppler imaging. Profunda Femoral Vein: No evidence of thrombus. Normal compressibility and flow on color Doppler imaging. Femoral Vein: No evidence of thrombus. Normal compressibility, respiratory phasicity and response to augmentation. Popliteal Vein: No evidence of thrombus. Normal compressibility,  respiratory phasicity and response to augmentation. Calf Veins: Not visualized due to edema. Superficial Great Saphenous Vein: No evidence of thrombus. Normal compressibility and flow on color Doppler imaging. Venous Reflux:  None. Other Findings:  Flow was pulsatile and all visualized regions. IMPRESSION: No evidence of deep venous thrombosis in either lower extremity. Note that there is limited calf vein visualization on the right and essentially complete absence of calf vein visualization on the left due to edema. Probable small Baker cyst right popliteal fossa region. Flow is pulsatile ungual regions, a finding that raises question of a degree of congestive heart failure. Electronically Signed   By: Lowella Grip III M.D.   On: 02/01/2015 11:00   Dg Chest Port 1 View  02/01/2015  CLINICAL DATA:  81 year old female with fall, lethargy, shortness of breath. Initial encounter. EXAM: PORTABLE CHEST 1 VIEW COMPARISON:  01/03/2014 and earlier. FINDINGS: Portable AP upright view at 0938 hours. Small to moderate bilateral pleural effusions are new, greater on the right. Chronic cardiomegaly. Sequelae of CABG. Calcified aortic atherosclerosis. Other mediastinal contours are within normal limits. Small calcified right peritracheal lymph node. No pneumothorax or pulmonary edema. No consolidation identified. IMPRESSION: Chronic cardiomegaly with small to moderate bilateral pleural effusions, new since 2015. No acute edema. Electronically Signed   By: Genevie Ann M.D.   On: 02/01/2015 09:48    EKG:   Orders placed or performed during the hospital encounter of 02/01/15  . ED EKG  . ED EKG    ASSESSMENT AND PLAN:   Malkah Whalon is a 81 y.o. female with a known history of HTN, CAD s/p CABG, Afib and CVA on eliquis, chronic lower extremity edema as to the hospital secondary to worsening edema of her lower extremities extending into her thighs.  #1 Chronic lower extremity edema- CHF exacerbation and also  chronic venous insufficiency - IV lasix bid, monitor renal function -Improving edema. Her leg wrappings can be discontinued as there is minimal edema in the lower leg  and it's causing her more pain. - wound care consult, no indication for ABX at this time.  #2 CHF- acute on chronic -Echocardiogram with EF less than 25%, severe global hypokinesis noted. Also has severe mitral regurgitation and severe tricuspid regurgitation. -Overall poor prognosis explained. - Continue IV lasix twice a day for today - Follows with Dr. Nehemiah Massed - troponins elevated- likely demand ischemia- recycle, monitor on tele -lower extremity edema slightly improving -Already on lisinopril and metoprolol  #3 CAD s/p CABG- stable, cont home meds  #4 HTN- lisinopril and metoprolol  #5 Afib with h/o CVA- on eliquis - on metoprolol for rate control  #6 DVT Prophylaxis- eliquis  Physical Therapy consulted as well. Discussed with daughter at bedside. Also addressed CODE STATUS. Daughter wants to talk with her mom and also brother prior to making that decision.    All the records are reviewed and case discussed with Care Management/Social Workerr. Management plans discussed with the patient, family and they are in agreement.  CODE STATUS: Full code  TOTAL TIME TAKING CARE OF THIS PATIENT: 36 minutes.   POSSIBLE D/C IN 2 DAYS, DEPENDING ON CLINICAL CONDITION.   Gladstone Lighter M.D on 02/02/2015 at 1:41 PM  Between 7am to 6pm - Pager - [phone]  After 6pm go to [email or web address] - password [email or web address] [facility]  Office  [phone]  CC: Primary care physician; Park Liter, DO

## 2015-02-02 NOTE — Progress Notes (Signed)
Physical Therapy Evaluation Patient Details Name: [name] MRN: [id number] DOB: 12-Apr-1922 Today's Date: 02/02/2015   History of Present Illness  [name] is a 81 y.o. female with a known history of HTN, CAD s/p CABG, Afib and CVA on eliquis, chronic lower extremity edema as to the hospital secondary to worsening edema of her lower extremities extending into her thighs  Clinical Impression  Pt presents with decreased strength and balance, decreased bed mobility and decreased transfers and gait and would benefit from PT services to address objective findings.  Pt needs min-mod A for bed mobility and transfers and is able to only stand and take a couple of steps with RW and Min A.  Pt limited by weakness and pain. Rec HHPT with 24 h supervision.    Follow Up Recommendations Home health PT;Supervision/Assistance - 24 hour    Equipment Recommendations  Rolling walker with 5" wheels    Recommendations for Other Services OT consult     Precautions / Restrictions Precautions Precautions: Fall Restrictions Weight Bearing Restrictions: No      Mobility  Bed Mobility Overal bed mobility: Needs Assistance Bed Mobility: Supine to Sit;Sit to Supine     Supine to sit: Min assist Sit to supine: Min assist   General bed mobility comments: Able to prop up on elbows, needs assist to elevate trunk over hips and to rotate to edge of bed.  Transfers Overall transfer level: Needs assistance Equipment used: Rolling walker (2 wheeled) Transfers: Sit to/from Stand Sit to Stand: Mod assist         General transfer comment: Pushes up from bed with R UE, increased time needed.  Ambulation/Gait Ambulation/Gait assistance: Min guard   Assistive device: Rolling walker (2 wheeled)       General Gait Details: Marching in place then took 3 steps forward and back.  Stairs            Wheelchair Mobility    Modified Rankin (Stroke Patients Only)       Balance Overall  balance assessment: Needs assistance Sitting-balance support: Feet supported   Sitting balance - Comments: Supervision for midline sitting EOB   Standing balance support: Bilateral upper extremity supported   Standing balance comment: Min A to maintain using RW for support; increased forward flexion                             Pertinent Vitals/Pain Pain Assessment: Faces Faces Pain Scale: Hurts even more Pain Descriptors / Indicators: Discomfort;Grimacing Pain Intervention(s): Limited activity within patient's tolerance;Patient requesting pain meds-RN notified    Home Living Family/patient expects to be discharged to:: Private residence Living Arrangements: Children Available Help at Discharge: Family;Home health;Available 24 hours/day (daughter and grandaughter) Type of Home: House Home Access: Stairs to enter   [employer] of Steps: 1 (stoop)   Home Equipment: Walker - 2 wheels;Walker - 4 wheels;Transport chair;Bedside commode      Prior Function Level of Independence: Independent with assistive device(s)         Comments: Decreased independence over the past couple of weeks due to increasing LE swelling; was getting HHPT prior to admission     Hand Dominance        Extremity/Trunk Assessment   Upper Extremity Assessment: Generalized weakness           Lower Extremity Assessment: Generalized weakness         Communication   Communication: No difficulties  Cognition  Arousal/Alertness: Awake/alert Behavior During Therapy: WFL for tasks assessed/performed                        General Comments General comments (skin integrity, edema, etc.): B LE bandages    Exercises General Exercises - Lower Extremity Ankle Circles/Pumps: AROM;Both;10 reps Quad Sets: AROM;Both;10 reps [name] Arc Quad: AROM;Both;10 reps Heel Slides: AROM;Both;10 reps Straight Leg Raises: AROM;Both;5 reps Hip Flexion/Marching: AROM;Both;5 reps       Assessment/Plan    PT Assessment Patient needs continued PT services  PT Diagnosis Difficulty walking;Generalized weakness;Acute pain   PT Problem List Decreased strength;Decreased activity tolerance;Decreased balance;Decreased mobility;Decreased safety awareness;Pain  PT Treatment Interventions     PT Goals (Current goals can be found in the Care Plan section) Acute Rehab PT Goals Patient Stated Goal: To return home. PT Goal Formulation: With patient/family Time For Goal Achievement: 02/16/15 Potential to Achieve Goals: Good    Frequency     Barriers to discharge        Co-evaluation               End of Session Equipment Utilized During Treatment: Gait belt Activity Tolerance: Patient tolerated treatment well;No increased pain;Patient limited by fatigue Patient left: in bed;with call bell/phone within reach;with bed alarm set;with family/visitor present Nurse Communication: Mobility status;Patient requests pain meds         Time: [id number] PT Time Calculation (min) (ACUTE ONLY): 45 min   Charges:   PT Evaluation $Initial PT Evaluation Tier I: 1 Procedure PT Treatments $Gait Training: 8-22 mins $Therapeutic Exercise: 8-22 mins   PT G Codes:        Varun Jourdan A Anye Brose 02/22/2015, 2:09 PM

## 2015-02-02 NOTE — Progress Notes (Signed)
Pt is alert with confusion at times, resting in bed quietly, bed bound at this time, physical therapy consulted with patient, fair appetite, receiving IV lasix, on room air, vital signs stable, daughter at bedside, bun and creatinine remain elevated but stable, WBC elevated- receiving iv antibiotics, pt c/o pain in hip and feet, bilateral leg wraps removed per Dr. Carlyle Dolly, pain improved with wrap removal, small ulcerations on left foot under wrap, lotion applied to legs and feet are elevated on pillow. Foley intact, uneventful shift.

## 2015-02-02 NOTE — Progress Notes (Signed)
Initial Nutrition Assessment     INTERVENTION:  Meals and snacks: Cater to pt preferences Medical Nutrition Supplement Therapy: If unable to meet nutritional needs will add supplement Nutrition diet education: Pt and dtr familiar with sodium restriction.  No further education needed at this time   NUTRITION DIAGNOSIS:   Inadequate oral intake related to acute illness as evidenced by per patient/family report.    GOAL:   Patient will meet greater than or equal to 90% of their needs    MONITOR:    (Energy intake)  REASON FOR ASSESSMENT:   Diagnosis    ASSESSMENT:      Pt admitted with AMS, leg swelling, CHF exacerbation  Past Medical History  Diagnosis Date  . Type II or unspecified type diabetes mellitus without mention of complication, not stated as uncontrolled   . Essential hypertension, benign   . Anemia, unspecified   . Atrial fibrillation ([place])   . Hypercholesterolemia   . Osteoarthritis   . History of TIAs   . CVA (cerebral infarction)   . Atrial fibrillation ([place])   . Uterine cancer ([facility])     s/p TAH/BSO  . CAD (coronary artery disease)     s/p CABG 10/99 with LIMA to the LAD, SVG to OM1, SVG to distal RCA  . Cellulitis   . Lymphedema   . Chronic venous insufficiency     Current Nutrition: ate most of breakfast this am and tolerated well  Food/Nutrition-Related History: pt reports good appetite prior to admission except for few days before admission decreased intake   Scheduled Medications:  . apixaban  2.5 mg Oral BID  . bacitracin-polymyxin b  1 application Right Eye BID  . cefTRIAXone (ROCEPHIN)  IV  1 g Intravenous Q24H  . citalopram  20 mg Oral Daily  . furosemide  40 mg Intravenous BID  . levothyroxine  25 mcg Oral QAC breakfast  . lidocaine  1 patch Transdermal Q24H  . lisinopril  20 mg Oral [id number]  . metoprolol tartrate  25 mg Oral BID  . potassium chloride  10 mEq Oral Daily  . sodium chloride  3 mL Intravenous Q12H      Electrolyte/Renal Profile and Glucose Profile:   Recent Labs Lab 02/01/15 0944 02/02/15 0142  NA 139 138  K 4.9 4.4  CL 102 105  CO2 27 25  BUN 50* 46*  CREATININE 1.34* 1.39*  CALCIUM 10.0 9.2  GLUCOSE 215* 152*    Gastrointestinal Profile: Last BM: unknown   Nutrition-Focused Physical Exam Findings: Nutrition-Focused physical exam completed. Findings are no fat depletion, mild/moderate muscle depletion, and unable to assess edema.      Weight Change: noted wt gain (fluid??)    Diet Order:  Diet 2 gram sodium Room service appropriate?: Yes; Fluid consistency:: Thin  Skin:   reviewed   Height:   Ht Readings from Last 1 Encounters:  02/01/15 5\' 6"  (1.676 m)    Weight:   Wt Readings from Last 1 Encounters:  02/01/15 150 lb 14.4 oz (68.448 kg)    Ideal Body Weight:     BMI:  Body mass index is 24.37 kg/(m^2).  Estimated Nutritional Needs:   Kcal:     Protein:     Fluid:     EDUCATION NEEDS:   No education needs identified at this time  LOW Care Level  [name] B. Zenia Resides, [place], [place] (pager)

## 2015-02-03 ENCOUNTER — Inpatient Hospital Stay: Payer: Medicare Other

## 2015-02-03 DIAGNOSIS — L8990 Pressure ulcer of unspecified site, unspecified stage: Secondary | ICD-10-CM | POA: Insufficient documentation

## 2015-02-03 LAB — BASIC METABOLIC PANEL WITH GFR
Anion gap: 9 (ref 5–15)
BUN: 45 mg/dL — ABNORMAL HIGH (ref 6–20)
CO2: 25 mmol/L (ref 22–32)
Calcium: 8.8 mg/dL — ABNORMAL LOW (ref 8.9–10.3)
Chloride: 101 mmol/L (ref 101–111)
Creatinine, Ser: 1.13 mg/dL — ABNORMAL HIGH (ref 0.44–1.00)
GFR calc Af Amer: 48 mL/min — ABNORMAL LOW
GFR calc non Af Amer: 41 mL/min — ABNORMAL LOW
Glucose, Bld: 130 mg/dL — ABNORMAL HIGH (ref 65–99)
Potassium: 4.1 mmol/L (ref 3.5–5.1)
Sodium: 135 mmol/L (ref 135–145)

## 2015-02-03 LAB — CBC
HEMATOCRIT: 37.8 % (ref 35.0–47.0)
Hemoglobin: 11.9 g/dL — ABNORMAL LOW (ref 12.0–16.0)
MCH: 25 pg — ABNORMAL LOW (ref 26.0–34.0)
MCHC: 31.4 g/dL — ABNORMAL LOW (ref 32.0–36.0)
MCV: 79.8 fL — AB (ref 80.0–100.0)
Platelets: 206 10*3/uL (ref 150–440)
RBC: 4.74 MIL/uL (ref 3.80–5.20)
RDW: 16.8 % — AB (ref 11.5–14.5)
WBC: 9.6 10*3/uL (ref 3.6–11.0)

## 2015-02-03 MED ORDER — FUROSEMIDE 40 MG PO TABS
40.0000 mg | ORAL_TABLET | Freq: Two times a day (BID) | ORAL | Status: DC
  Administered 2015-02-03 – 2015-02-04 (×2): 40 mg via ORAL
  Filled 2015-02-03 (×2): qty 1

## 2015-02-03 MED ORDER — CEPHALEXIN 250 MG PO CAPS
250.0000 mg | ORAL_CAPSULE | Freq: Three times a day (TID) | ORAL | Status: DC
  Administered 2015-02-03 – 2015-02-05 (×6): 250 mg via ORAL
  Filled 2015-02-03 (×11): qty 1

## 2015-02-03 NOTE — Progress Notes (Signed)
Foley was d/c. Bedpan. A & O but forgetful at times. Room air. A fib. Takes meds whole in apple sauce. Legs are elevated. Pt reported leg pain and received Norco. Lidocaine patch applied to R lower back. Pt to d/c home with [facility] tomorrow 12/19. Family at the bedside. Pt has no further concerns at this time.

## 2015-02-03 NOTE — Progress Notes (Signed)
Alert with periods of confusion. Foley in place. No complaints. Allevyn patch on foot wounds. Receiving IV lasix and IV antibiotics. Room air.

## 2015-02-03 NOTE — Progress Notes (Signed)
[facility] [place] NAME: [name]    MR#:  [id number]  DATE OF BIRTH:  04/03/1922  SUBJECTIVE:  CHIEF COMPLAINT:   Chief Complaint  Patient presents with  . Leg Swelling  . Altered Mental Status   - Appears slightly confused today. No further pain once the lower extremity wrappings were removed. - breathing better. Not on oxygen.  REVIEW OF SYSTEMS:  Review of Systems  Constitutional: Negative for fever and chills.  HENT: Positive for hearing loss. Negative for ear discharge, ear pain and nosebleeds.   Respiratory: Negative for cough, shortness of breath and wheezing.   Cardiovascular: Negative for chest pain and palpitations.  Gastrointestinal: Negative for nausea, vomiting, abdominal pain, diarrhea and constipation.  Genitourinary: Negative for dysuria and urgency.  Musculoskeletal: Positive for myalgias.  Skin: Negative for rash.       Dry skin noted  Neurological: Positive for weakness. Negative for dizziness, speech change, focal weakness, seizures and headaches.  Psychiatric/Behavioral: Negative for depression.    DRUG ALLERGIES:   Allergies  Allergen Reactions  . Erythromycin Ethylsuccinate Other (See Comments)    REACTION:  unknown  . Mycinette [Phenol] Other (See Comments)    REACTION:  unknown    VITALS:  Blood pressure 111/65, pulse 91, temperature 98.4 F (36.9 C), temperature source Oral, resp. rate 24, height 5\' 6"  (1.676 m), weight 68.448 kg (150 lb 14.4 oz), SpO2 96 %.  PHYSICAL EXAMINATION:  Physical Exam  GENERAL: 81 y.o.-year-old patient lying in the bed with no acute distress. Appears very fatigued and weak. EYES: Pupils equal, round, reactive to light and accommodation. No scleral icterus. Extraocular muscles intact.  HEENT: Head atraumatic, normocephalic. Oropharynx and nasopharynx clear.  NECK: Supple, no jugular venous distention. No thyroid enlargement, no tenderness.  LUNGS:  Normal breath sounds bilaterally, no wheezing,rhonchi. Scattered rales occasionally. No use of accessory muscles of respiration.  CARDIOVASCULAR: S1, S2 normal. No rubs, or gallops. 3/6 systolic murmur heard. ABDOMEN: Soft, nontender, nondistended. Bowel sounds present. No organomegaly or mass.  EXTREMITIES: 1+ edema mostly above knees, up to the thighs. There's a superficial abrasion on the dorsal side of the left ankle.Marland Kitchen No cyanosis, or clubbing.  NEUROLOGIC: Cranial nerves II through XII are intact. Muscle strength 5/5 in all extremities. Sensation intact. Gait not checked. Generalized weakness. PSYCHIATRIC: The patient is alert and oriented x 2.  SKIN: No obvious rash, lesion, or ulcer.     LABORATORY PANEL:   CBC  Recent Labs Lab 02/03/15 0441  WBC 9.6  HGB 11.9*  HCT 37.8  PLT 206   ------------------------------------------------------------------------------------------------------------------  Chemistries   Recent Labs Lab 02/03/15 0441  NA 135  K 4.1  CL 101  CO2 25  GLUCOSE 130*  BUN 45*  CREATININE 1.13*  CALCIUM 8.8*   ------------------------------------------------------------------------------------------------------------------  Cardiac Enzymes  Recent Labs Lab 02/02/15 0142  TROPONINI 0.14*   ------------------------------------------------------------------------------------------------------------------  RADIOLOGY:  US Venous Img Lower Bilateral  02/01/2015  CLINICAL DATA:  Bilateral lower extremity edema EXAM: BILATERAL LOWER EXTREMITY VENOUS DUPLEX ULTRASOUND TECHNIQUE: Gray-scale sonography with graded compression, as well as color Doppler and duplex ultrasound were performed to evaluate the lower extremity deep venous systems from the level of the common femoral vein and including the common femoral, femoral, profunda femoral, popliteal and calf veins including the posterior tibial, peroneal and gastrocnemius veins when visible. The  superficial great saphenous vein was also interrogated. Spectral Doppler was utilized to evaluate flow at rest and with  distal augmentation maneuvers in the common femoral, femoral and popliteal veins. COMPARISON:  August 13, 2014 FINDINGS: RIGHT LOWER EXTREMITY Common Femoral Vein: No evidence of thrombus. Normal compressibility, respiratory phasicity and response to augmentation. Saphenofemoral Junction: No evidence of thrombus. Normal compressibility and flow on color Doppler imaging. Profunda Femoral Vein: No evidence of thrombus. Normal compressibility and flow on color Doppler imaging. Femoral Vein: No evidence of thrombus. Normal compressibility, respiratory phasicity and response to augmentation. Popliteal Vein: No evidence of thrombus. Normal compressibility, respiratory phasicity and response to augmentation. Calf Veins: Limited visualization due to edema. No evidence of thrombus. Normal compressibility and flow on color Doppler imaging. Superficial Great Saphenous Vein: No evidence of thrombus. Normal compressibility and flow on color Doppler imaging. Venous Reflux:  None. Other Findings: Flow is pulsatile throughout the visualized venous structures. There is a fluid-filled structure in the right popliteal fossa measuring 2.5 x 0.7 cm. LEFT LOWER EXTREMITY Common Femoral Vein: No evidence of thrombus. Normal compressibility, respiratory phasicity and response to augmentation. Saphenofemoral Junction: No evidence of thrombus. Normal compressibility and flow on color Doppler imaging. Profunda Femoral Vein: No evidence of thrombus. Normal compressibility and flow on color Doppler imaging. Femoral Vein: No evidence of thrombus. Normal compressibility, respiratory phasicity and response to augmentation. Popliteal Vein: No evidence of thrombus. Normal compressibility, respiratory phasicity and response to augmentation. Calf Veins: Not visualized due to edema. Superficial Great Saphenous Vein: No evidence of  thrombus. Normal compressibility and flow on color Doppler imaging. Venous Reflux:  None. Other Findings:  Flow was pulsatile and all visualized regions. IMPRESSION: No evidence of deep venous thrombosis in either lower extremity. Note that there is limited calf vein visualization on the right and essentially complete absence of calf vein visualization on the left due to edema. Probable small Baker cyst right popliteal fossa region. Flow is pulsatile ungual regions, a finding that raises question of a degree of congestive heart failure. Electronically Signed   By: Lowella Grip III M.D.   On: 02/01/2015 11:00   Dg Chest Port 1 View  02/01/2015  CLINICAL DATA:  81 year old female with fall, lethargy, shortness of breath. Initial encounter. EXAM: PORTABLE CHEST 1 VIEW COMPARISON:  01/03/2014 and earlier. FINDINGS: Portable AP upright view at 0938 hours. Small to moderate bilateral pleural effusions are new, greater on the right. Chronic cardiomegaly. Sequelae of CABG. Calcified aortic atherosclerosis. Other mediastinal contours are within normal limits. Small calcified right peritracheal lymph node. No pneumothorax or pulmonary edema. No consolidation identified. IMPRESSION: Chronic cardiomegaly with small to moderate bilateral pleural effusions, new since 2015. No acute edema. Electronically Signed   By: Genevie Ann M.D.   On: 02/01/2015 09:48    EKG:   Orders placed or performed during the hospital encounter of 02/01/15  . ED EKG  . ED EKG    ASSESSMENT AND PLAN:   [name] is a 81 y.o. female with a known history of HTN, CAD s/p CABG, Afib and CVA on eliquis, chronic lower extremity edema as to the hospital secondary to worsening edema of her lower extremities extending into her thighs.  #1 Chronic lower extremity edema- CHF exacerbation and also chronic venous insufficiency - on lasix bid, monitor renal function -Improving edema. Her leg wrappings can be discontinued as there is minimal  edema in the lower leg and it's causing her more pain. - wound care consult, no indication for ABX at this time.  #2 CHF- acute on chronic systolic - ischemic cardiomyopathy noted -Echocardiogram with EF less  than 25%, severe global hypokinesis noted. Also has severe mitral regurgitation and severe tricuspid regurgitation. -Overall poor prognosis explained. - Continue lasix twice a day for now- change to PO today - Follows with Dr. Nehemiah Massed - troponins elevated- likely demand ischemia- recycle, monitor on tele -lower extremity edema improving -Already on lisinopril and metoprolol - discontinue foley catheter  #3 UTI- on rocephin- change to keflex  #4 CAD s/p CABG- stable, cont home meds  #5 HTN- lisinopril and metoprolol  #6 Afib with h/o CVA- on eliquis - on metoprolol for rate control  #7 DVT Prophylaxis- eliquis  Physical Therapy consulted as well. Home health recommended   All the records are reviewed and case discussed with Care Management/Social Workerr. Management plans discussed with the patient, family and they are in agreement.  CODE STATUS: Full code  TOTAL TIME TAKING CARE OF THIS PATIENT: 36 minutes.   POSSIBLE D/C TOMORROW, DEPENDING ON CLINICAL CONDITION.   Gladstone Lighter M.D on 02/03/2015 at 8:33 AM  Between 7am to 6pm - Pager - [phone]  After 6pm go to [email or web address] - password [email or web address] [facility]  Office  [phone]  CC: Primary care physician; Park Liter, DO

## 2015-02-04 MED ORDER — CARVEDILOL 3.125 MG PO TABS
3.1250 mg | ORAL_TABLET | Freq: Two times a day (BID) | ORAL | Status: DC
  Filled 2015-02-04: qty 1

## 2015-02-04 MED ORDER — SODIUM CHLORIDE 0.9 % IJ SOLN: 3.0000 mL | INTRAMUSCULAR | Status: DC | PRN

## 2015-02-04 MED ORDER — FUROSEMIDE 40 MG PO TABS
40.0000 mg | ORAL_TABLET | Freq: Every day | ORAL | Status: DC
  Administered 2015-02-05: 09:00:00 40 mg via ORAL
  Filled 2015-02-04: qty 1

## 2015-02-04 NOTE — Progress Notes (Signed)
[facility] [place] NAME: [name]    MR#:  [id number]  DATE OF BIRTH:  07-05-1922  SUBJECTIVE:  CHIEF COMPLAINT:   Chief Complaint  Patient presents with  . Leg Swelling  . Altered Mental Status   - Appears tachypneic today, on room air though. CXR shows improvement yesterday - Feels extremely weak, family concerned about taking patient home and want to try rehab. -  REVIEW OF SYSTEMS:  Review of Systems  Constitutional: Negative for fever and chills.  HENT: Positive for hearing loss. Negative for ear discharge, ear pain and nosebleeds.   Respiratory: Negative for cough, shortness of breath and wheezing.   Cardiovascular: Negative for chest pain and palpitations.  Gastrointestinal: Negative for nausea, vomiting, abdominal pain, diarrhea and constipation.  Genitourinary: Negative for dysuria and urgency.  Musculoskeletal: Positive for myalgias.  Skin: Negative for rash.       Dry skin noted  Neurological: Positive for weakness. Negative for dizziness, speech change, focal weakness, seizures and headaches.  Psychiatric/Behavioral: Negative for depression.    DRUG ALLERGIES:   Allergies  Allergen Reactions  . Erythromycin Ethylsuccinate Other (See Comments)    REACTION:  unknown  . Mycinette [Phenol] Other (See Comments)    REACTION:  unknown    VITALS:  Blood pressure 95/57, pulse 101, temperature 97.8 F (36.6 C), temperature source Oral, resp. rate 18, height 5\' 6"  (1.676 m), weight 68.448 kg (150 lb 14.4 oz), SpO2 95 %.  PHYSICAL EXAMINATION:  Physical Exam  GENERAL: 81 y.o.-year-old patient lying in the bed with no acute distress. Appears very fatigued and weak. EYES: Pupils equal, round, reactive to light and accommodation. No scleral icterus. Extraocular muscles intact.  HEENT: Head atraumatic, normocephalic. Oropharynx and nasopharynx clear.  NECK: Supple, no jugular venous distention. No thyroid  enlargement, no tenderness.  LUNGS: Normal breath sounds bilaterally, decreased bibasilar breath sounds. no wheezing,rhonchi. Scattered rales occasionally. No use of accessory muscles of respiration.  CARDIOVASCULAR: S1, S2 normal. No rubs, or gallops. 3/6 systolic murmur heard. ABDOMEN: Soft, nontender, nondistended. Bowel sounds present. No organomegaly or mass.  EXTREMITIES: 1+ edema mostly above knees, up to the thighs. There's a superficial abrasion on the dorsal side of the left ankle.Marland Kitchen No cyanosis, or clubbing.  NEUROLOGIC: Cranial nerves II through XII are intact. Muscle strength 5/5 in all extremities. Sensation intact. Gait not checked. Generalized weakness. PSYCHIATRIC: The patient is alert and oriented x 2.  SKIN: No obvious rash, lesion, or ulcer.     LABORATORY PANEL:   CBC  Recent Labs Lab 02/03/15 0441  WBC 9.6  HGB 11.9*  HCT 37.8  PLT 206   ------------------------------------------------------------------------------------------------------------------  Chemistries   Recent Labs Lab 02/03/15 0441  NA 135  K 4.1  CL 101  CO2 25  GLUCOSE 130*  BUN 45*  CREATININE 1.13*  CALCIUM 8.8*   ------------------------------------------------------------------------------------------------------------------  Cardiac Enzymes  Recent Labs Lab 02/02/15 0142  TROPONINI 0.14*   ------------------------------------------------------------------------------------------------------------------  RADIOLOGY:  Dg Chest 2 View  02/03/2015  CLINICAL DATA:  CHF, altered mental status, chronic leg swelling, hypertension, coronary artery disease post CABG, atrial fibrillation, type II diabetes mellitus EXAM: CHEST  2 VIEW COMPARISON:  02/01/2015 FINDINGS: Enlargement of cardiac silhouette post CABG. Atherosclerotic calcification aorta. Pulmonary vascularity normal. Small bibasilar pleural effusions and minimal atelectasis. Upper lungs clear. No pneumothorax. Bones  demineralized. IMPRESSION: Persisting cardiomegaly post CABG. Small bibasilar pleural effusions and atelectasis. Electronically Signed   By: Crist Infante.D.  On: 02/03/2015 11:27    EKG:   Orders placed or performed during the hospital encounter of 02/01/15  . ED EKG  . ED EKG    ASSESSMENT AND PLAN:   Gage Nagle is a 81 y.o. female with a known history of HTN, CAD s/p CABG, Afib and CVA on eliquis, chronic lower extremity edema as to the hospital secondary to worsening edema of her lower extremities extending into her thighs.  #1 Chronic lower extremity edema- CHF exacerbation and also chronic venous insufficiency - on lasix - changed to q daily as BP limiting now and also CXR with no pulm vascular congestion though has significant cardiomegaly -Her leg wrappings can be discontinued as it's causing her more pain. Can try TEDs - wound care consult, no indication for ABX at this time.  #2 CHF- acute on chronic systolic - ischemic cardiomyopathy noted -Echocardiogram with EF less than 25%, severe global hypokinesis noted. Also has severe mitral regurgitation and severe tricuspid regurgitation. -Overall poor prognosis explained. - lasix changed to q daily, low dose coreg started. Lisinopril discontinued for now due to hypotension - Follows with Dr. Nehemiah Massed - troponins elevated- likely demand ischemia- recycle, monitor on teler  #3 UTI- on keflex- 7 days total of ABX  #4 CAD s/p CABG- stable, cont home meds  #5 HTN- low BP, on lasix and coreg for now  #6 Afib with h/o CVA- on eliquis - on coreg for rate control  #7 DVT Prophylaxis- eliquis  Physical Therapy consulted as well. Possible rehab tomorrow Discussed code status with patient and daughter again today- they want time to think about it.   All the records are reviewed and case discussed with Care Management/Social Workerr. Management plans discussed with the patient, family and they are in agreement.  CODE STATUS:  Full code  TOTAL TIME TAKING CARE OF THIS PATIENT: 36 minutes.   POSSIBLE D/C TOMORROW, DEPENDING ON CLINICAL CONDITION.   Gladstone Lighter M.D on 02/04/2015 at 2:24 PM  Between 7am to 6pm - Pager - [phone]  After 6pm go to [email or web address] - password [email or web address] [facility]  Office  [phone]  CC: Primary care physician; Park Liter, DO

## 2015-02-04 NOTE — Care Management (Signed)
Plan for patient to discharge to [facility] 12/20.  CSW following

## 2015-02-04 NOTE — Clinical Social Work Placement (Signed)
   CLINICAL SOCIAL WORK PLACEMENT  NOTE  Date:  02/04/2015  Patient Details  Name: [name] MRN: [id number] Date of Birth: August 11, 1922  Clinical Social Work is seeking post-discharge placement for this patient at the [place] level of care (*CSW will initial, date and re-position this form in  chart as items are completed):  Yes   Patient/family provided with [facility] Work Department's list of facilities offering this level of care within the geographic area requested by the patient (or if unable, by the patient's family).  Yes   Patient/family informed of their freedom to choose among providers that offer the needed level of care, that participate in Medicare, Medicaid or managed care program needed by the patient, have an available bed and are willing to accept the patient.  Yes   Patient/family informed of [facility]'s ownership interest in [facility] and [facility], as well as of the fact that they are under no obligation to receive care at these facilities.  PASRR submitted to EDS on       PASRR number received on       Existing PASRR number confirmed on 02/04/15     FL2 transmitted to all facilities in geographic area requested by pt/family on 02/04/15     FL2 transmitted to all facilities within larger geographic area on       Patient informed that his/her managed care company has contracts with or will negotiate with certain facilities, including the following:            Patient/family informed of bed offers received.  Patient chooses bed at       Physician recommends and patient chooses bed at      Patient to be transferred to   on  .  Patient to be transferred to facility by       Patient family notified on   of transfer.  Name of family member notified:        PHYSICIAN       Additional Comment:    _______________________________________________ Loralyn Freshwater, LCSW 02/04/2015, 1:59 PM

## 2015-02-04 NOTE — Clinical Social Work Note (Signed)
Clinical Social Work Assessment  Patient Details  Name: [name] MRN: [id number] Date of Birth: 02-18-1922  Date of referral:  02/04/15               Reason for consult:  Facility Placement                Permission sought to share information with:  Chartered certified accountant granted to share information::  Yes, Verbal Permission Granted  Name::      [name]::   [name]   Relationship::     Contact Information:     Housing/Transportation Living arrangements for the past 2 months:  [place] of Information:  Patient, Adult Children, Power of Attorney Patient Interpreter Needed:  None Criminal Activity/Legal Involvement Pertinent to Current Situation/Hospitalization:  No - Comment as needed Significant Relationships:  Adult Children Lives with:  Adult Children Do you feel safe going back to the place where you live?  Yes Need for family participation in patient care:  Yes (Comment)  Care giving concerns: Patient lives in [place] with her daughter [name] [facility]) cell [phone] home [phone].    Social Worker assessment / plan: Holiday representative (CSW) received verbal consult from MD that patient needs rehab at [facility]. PT is recommending home health with 24 assist however patient does not have 24 assistance. CSW met with patient and her daughter [name] was at bedside. Patient was laying in the bed and did not participate in assessment. Daughter reported that patient lives with her in [place] and she works during the day. Patient reported that her daughter lives close and helps as needed. CSW explained SNF process. Daughter reported that patient was at [facility] in Sept. 2016. Per daughter patient has not been to rehab or the hospital since Sept. 2016, meeting the 60 day wellness period. Patient should have all SNF Medicare days available. Daughter requested [employer] [facility]. CSW also  discussed long term care and Medicaid with daughter. Per daughter patient has a poor prognosis and may not come out of rehab. CSW provided emotional support.   FL2 complete and faxed out.   Employment status:  Retired Forensic scientist:  Medicare PT Recommendations:  Home with [name] (24 hour assistance with home health. ) Information / Referral to community resources:  [place]  Patient/Family's Response to care: Patient's daughter is agreeable to SNF search.   Patient/Family's Understanding of and Emotional Response to Diagnosis, Current Treatment, and Prognosis: Daughter was pleasant throughout assessment and thanked CSW for visit.   Emotional Assessment Appearance:  Appears stated age Attitude/Demeanor/Rapport:    Affect (typically observed):  Pleasant Orientation:  Oriented to Self, Fluctuating Orientation (Suspected and/or reported Sundowners) Alcohol / Substance use:  Not Applicable Psych involvement (Current and /or in the community):  No (Comment)  Discharge Needs  Concerns to be addressed:  Discharge Planning Concerns Readmission within the last 30 days:  No Current discharge risk:  Chronically ill, Dependent with Mobility Barriers to Discharge:  Continued Medical Work up   Loralyn Freshwater, LCSW 02/04/2015, 2:00 PM

## 2015-02-04 NOTE — NC FL2 (Signed)
[place] LEVEL OF CARE SCREENING TOOL     IDENTIFICATION  Patient Name: [name] Birthdate: Oct 06, 1922 Sex: female Admission Date (Current Location): 02/01/2015  [place] and [place] Number:  Engineering geologist and Address:  [facility], [address] [place], [place] [address]      Provider Number: [id number]  Attending Physician Name and Address:  Gladstone Lighter, MD  Relative Name and Phone Number:       Current Level of Care: Hospital Recommended Level of Care: [place] Prior Approval Number:    Date Approved/Denied:   PASRR Number:  ([id number])  Discharge Plan: SNF    Current Diagnoses: Patient Active Problem List   Diagnosis Date Noted  . Pressure ulcer 02/03/2015  . CHF (congestive heart failure) ([place]) 02/01/2015  . Hypothyroidism 12/03/2014  . Elevated TSH 12/03/2014  . Thrombocytopenia ([place]) 11/30/2014  . Cellulitis 08/14/2014  . Cellulitis of left leg 08/13/2014  . Rash 07/02/2014  . Edema 01/28/2014  . Toenail fungus 06/25/2013  . CVA (cerebral vascular accident) ([place]) 04/02/2013  . ICH (intracerebral hemorrhage) on anticoagulation, after recent LPCA infarct 01/26/2013  . Neuropathy ([place]) 12/18/2012  . Hyponatremia 06/19/2012  . Atrial fibrillation ([place]) 06/17/2012  . Essential hypertension, benign 06/17/2012  . CAD (coronary artery disease) 06/17/2012  . Peripheral vascular disease ([place]) 06/17/2012  . Diabetes ([place]) 06/17/2012  . Hypercholesterolemia 06/17/2012  . Depression 06/17/2012    Orientation RESPIRATION BLADDER Height & Weight    Self, Place  Normal Continent 5\' 6"  (167.6 cm) 150 lbs.  BEHAVIORAL SYMPTOMS/MOOD NEUROLOGICAL BOWEL NUTRITION STATUS   (none )  (none ) Continent Diet (Diet: 2 Grams sodium. )  AMBULATORY STATUS COMMUNICATION OF NEEDS Skin   Extensive Assist Verbally PU Stage and Appropriate Care (Unstagable Pressure Ulcer. Pressure Ulcer Stage 3: foot.  )                       Personal Care Assistance Level of Assistance  Bathing, Feeding, Dressing Bathing Assistance: Limited assistance Feeding assistance: Limited assistance Dressing Assistance: Limited assistance     Functional Limitations Info  Sight, Hearing, Speech Sight Info: Impaired Hearing Info: Adequate Speech Info: Adequate    SPECIAL CARE FACTORS FREQUENCY  PT (By licensed PT)     PT Frequency:  (5)              Contractures      Additional Factors Info  Code Status, Allergies Code Status Info:  (Full Code. ) Allergies Info:  (Erythromycin Ethylsuccinate, Mycinette Phenol)           Current Medications (02/04/2015):  This is the current hospital active medication list Current Facility-Administered Medications  Medication Dose Route Frequency Provider Last Rate Last Dose  . acetaminophen (TYLENOL) tablet 650 mg  650 mg Oral Q6H PRN Gladstone Lighter, MD   650 mg at 02/02/15 1154   Or  . acetaminophen (TYLENOL) suppository 650 mg  650 mg Rectal Q6H PRN Gladstone Lighter, MD      . ALPRAZolam Duanne Moron) tablet 0.25 mg  0.25 mg Oral TID PRN Gladstone Lighter, MD      . apixaban (ELIQUIS) tablet 2.5 mg  2.5 mg Oral BID Gladstone Lighter, MD   2.5 mg at 02/04/15 0927  . bacitracin-polymyxin b (POLYSPORIN) ophthalmic ointment 1 application  1 application Right Eye BID Gladstone Lighter, MD   1 application at [id number] 1000  . cephALEXin (KEFLEX) capsule 250 mg  250 mg Oral  3 times per day Gladstone Lighter, MD   250 mg at 02/04/15 0506  . citalopram (CELEXA) tablet 20 mg  20 mg Oral Daily Gladstone Lighter, MD   20 mg at 02/04/15 0927  . docusate sodium (COLACE) capsule 100 mg  100 mg Oral QHS PRN Gladstone Lighter, MD   100 mg at 02/03/15 0614  . furosemide (LASIX) tablet 40 mg  40 mg Oral BID Gladstone Lighter, MD   40 mg at 02/04/15 0927  . HYDROcodone-acetaminophen (NORCO/VICODIN) 5-325 MG per tablet 1 tablet  1 tablet Oral Q4H PRN Gladstone Lighter, MD    1 tablet at 02/04/15 1256  . levothyroxine (SYNTHROID, LEVOTHROID) tablet 25 mcg  25 mcg Oral QAC breakfast Gladstone Lighter, MD   25 mcg at 02/04/15 [id number]  . lidocaine (LIDODERM) 5 % 1 patch  1 patch Transdermal Q24H Gladstone Lighter, MD   1 patch at 02/03/15 1315  . lisinopril (PRINIVIL,ZESTRIL) tablet 20 mg  20 mg Oral Maury Dus, MD   20 mg at 02/04/15 0506  . metoprolol tartrate (LOPRESSOR) tablet 25 mg  25 mg Oral BID Gladstone Lighter, MD   Stopped at 02/04/15 1000  . mineral oil-hydrophilic petrolatum (AQUAPHOR) ointment   Topical BID Gladstone Lighter, MD      . morphine 2 MG/ML injection 1 mg  1 mg Intravenous Q4H PRN Gladstone Lighter, MD      . ondansetron (ZOFRAN) tablet 4 mg  4 mg Oral Q6H PRN Gladstone Lighter, MD       Or  . ondansetron (ZOFRAN) injection 4 mg  4 mg Intravenous Q6H PRN Gladstone Lighter, MD      . potassium chloride (K-DUR,KLOR-CON) CR tablet 10 mEq  10 mEq Oral Daily Gladstone Lighter, MD   10 mEq at 02/04/15 0927  . sodium chloride 0.9 % injection 3 mL  3 mL Intravenous Q12H Gladstone Lighter, MD   3 mL at 02/04/15 1000     Discharge Medications: Please see discharge summary for a list of discharge medications.  Relevant Imaging Results:  Relevant Lab Results:   Additional Information  (SSN: [id number])  Loralyn Freshwater, LCSW

## 2015-02-04 NOTE — Progress Notes (Signed)
Initial appointment made at the [facility] on February 28, 2015 at 10:00am. Thank you.

## 2015-02-04 NOTE — Progress Notes (Signed)
A fib. Room air. Lidocaine patch applied to L side. Pt reported pain and received meds. Takes meds whole in apple sauce. Family at the bedside. Bed search began. Incontinent. Pt has no further concerns at this time.

## 2015-02-04 NOTE — Progress Notes (Signed)
Clinical Education officer, museum (CSW) presented bed offers to patient's daughter. She chose [facility]. [facility] admissions coordinator at [facility] is aware of accepted bed offer. Plan is for patient to D/C tomorrow.   Blima Rich, [place] [phone]

## 2015-02-04 NOTE — Discharge Instructions (Signed)
[facility] Failure [facility] appointment on February 28, 2015 at 10:00am with Darylene Price, [place]. Please call [phone] to reschedule.

## 2015-02-04 NOTE — Care Management Important Message (Signed)
Important Message  Patient Details  Name: [name] MRN: [id number] Date of Birth: January 22, 1923   Medicare Important Message Given:  Yes    Juliann Pulse A [name] 02/04/2015, 1:03 PM

## 2015-02-05 ENCOUNTER — Telehealth: Payer: Self-pay | Admitting: Family Medicine

## 2015-02-05 LAB — BASIC METABOLIC PANEL
Anion gap: 8 (ref 5–15)
BUN: 44 mg/dL — ABNORMAL HIGH (ref 6–20)
CHLORIDE: 101 mmol/L (ref 101–111)
CO2: 25 mmol/L (ref 22–32)
Calcium: 9 mg/dL (ref 8.9–10.3)
Creatinine, Ser: 1.14 mg/dL — ABNORMAL HIGH (ref 0.44–1.00)
GFR calc non Af Amer: 41 mL/min — ABNORMAL LOW (ref >60)
GFR, EST AFRICAN AMERICAN: 48 mL/min — AB (ref >60)
Glucose, Bld: 123 mg/dL — ABNORMAL HIGH (ref 65–99)
POTASSIUM: 4.7 mmol/L (ref 3.5–5.1)
SODIUM: 134 mmol/L — AB (ref 135–145)

## 2015-02-05 MED ORDER — ACETAMINOPHEN 325 MG PO TABS: 650.0000 mg | ORAL_TABLET | Freq: Four times a day (QID) | ORAL | Status: AC | PRN

## 2015-02-05 MED ORDER — CEPHALEXIN 250 MG PO CAPS: 250.0000 mg | ORAL_CAPSULE | Freq: Three times a day (TID) | ORAL | Status: AC

## 2015-02-05 MED ORDER — CARVEDILOL 3.125 MG PO TABS: 3.1250 mg | ORAL_TABLET | Freq: Two times a day (BID) | ORAL | Status: AC

## 2015-02-05 MED ORDER — ALPRAZOLAM 0.25 MG PO TABS: 0.2500 mg | ORAL_TABLET | Freq: Three times a day (TID) | ORAL | Status: AC | PRN

## 2015-02-05 MED ORDER — HYDROCODONE-ACETAMINOPHEN 5-325 MG PO TABS: 1.0000 | ORAL_TABLET | Freq: Four times a day (QID) | ORAL | Status: AC | PRN

## 2015-02-05 MED ORDER — LISINOPRIL 5 MG PO TABS: 5.0000 mg | ORAL_TABLET | Freq: Every day | ORAL | Status: AC

## 2015-02-05 NOTE — Telephone Encounter (Signed)
I called pharmacy CANCEL metoprolol Discharge summary says she is taking carvedilol now and she should be bringing Rx with her

## 2015-02-05 NOTE — Clinical Social Work Placement (Signed)
   CLINICAL SOCIAL WORK PLACEMENT  NOTE  Date:  02/05/2015  Patient Details  Name: [name] MRN: [id number] Date of Birth: 1923-02-11  Clinical Social Work is seeking post-discharge placement for this patient at the [place] level of care (*CSW will initial, date and re-position this form in  chart as items are completed):  Yes   Patient/family provided with [place] Work Department's list of facilities offering this level of care within the geographic area requested by the patient (or if unable, by the patient's family).  Yes   Patient/family informed of their freedom to choose among providers that offer the needed level of care, that participate in Medicare, Medicaid or managed care program needed by the patient, have an available bed and are willing to accept the patient.  Yes   Patient/family informed of [place]'s ownership interest in [facility] and [facility], as well as of the fact that they are under no obligation to receive care at these facilities.  PASRR submitted to EDS on       PASRR number received on       Existing PASRR number confirmed on 02/04/15     FL2 transmitted to all facilities in geographic area requested by pt/family on 02/04/15     FL2 transmitted to all facilities within larger geographic area on       Patient informed that his/her managed care company has contracts with or will negotiate with certain facilities, including the following:        Yes   Patient/family informed of bed offers received.  Patient chooses bed at [facility]     Physician recommends and patient chooses bed at  [facility])    Patient to be transferred to [facility] on 02/05/15.  Patient to be transferred to facility by [facility]     Patient family notified on 02/05/15 of transfer.  Name of family member notified:  Pt and Pt's daughter [name]     PHYSICIAN       Additional Comment:     _______________________________________________ Darden Dates, LCSW 02/05/2015, 1:24 PM

## 2015-02-05 NOTE — Discharge Summary (Signed)
[place] at [place] NAME: [name]    MR#:  [id number]  DATE OF BIRTH:  Jan 17, 1923  DATE OF ADMISSION:  02/01/2015 ADMITTING PHYSICIAN: Gladstone Lighter, MD  DATE OF DISCHARGE: 02/05/2015  PRIMARY CARE PHYSICIAN: Park Liter, DO    ADMISSION DIAGNOSIS:  Edema [R60.9] Elevated troponin I level [R79.89] Chronic congestive heart failure, unspecified congestive heart failure type ([place]) [I50.9]  DISCHARGE DIAGNOSIS:  Active Problems:   CHF (congestive heart failure) (HCC)   Pressure ulcer   SECONDARY DIAGNOSIS:   Past Medical History  Diagnosis Date  . Type II or unspecified type diabetes mellitus without mention of complication, not stated as uncontrolled   . Essential hypertension, benign   . Anemia, unspecified   . Atrial fibrillation ([place])   . Hypercholesterolemia   . Osteoarthritis   . History of TIAs   . CVA (cerebral infarction)   . Atrial fibrillation ([place])   . Uterine cancer (HCC)     s/p TAH/BSO  . CAD (coronary artery disease)     s/p CABG 10/99 with LIMA to the LAD, SVG to OM1, SVG to distal RCA  . Cellulitis   . Lymphedema   . Chronic venous insufficiency     HOSPITAL COURSE:   [name] is a 81 y.o. female with a known history of HTN, CAD s/p CABG, Afib and CVA on eliquis, chronic lower extremity edema as to the hospital secondary to worsening edema of her lower extremities extending into her thighs.  #1 Chronic lower extremity edema- CHF exacerbation and also chronic venous insufficiency - on lasix  and also CXR with improving pulm vascular congestion though has significant cardiomegaly -Her leg wrappings can be discontinued as it's causing her more pain. Can try TEDs as tolerated.  - can also use Ace wraps not too tight, as tolerated - wound care consult, no indication for ABX at this time.  #2 CHF- acute on chronic systolic - ischemic cardiomyopathy noted -Echocardiogram with EF less  than 25%, severe global hypokinesis noted. Also has severe mitral regurgitation and severe tricuspid regurgitation. -Overall poor prognosis explained. - lasix changed to q daily, low dose coreg and lisinopril started. Meds can be adjusted and titrated, if blood pressure allows - Follows with Dr. Nehemiah Massed - troponins elevated- likely demand ischemia -Use incentive spirometry as tolerated.  #3 UTI- on keflex- will finish in 2 days  #4 CAD s/p CABG- stable, cont home meds  #5 HTN- low BP, on lasix, lisinopril and coreg for now  #6 Afib with h/o CVA- on eliquis - on coreg for rate control  Patient is extremely weak, family cannot care for her at home. Physical therapy recommended rehabilitation. Patient will be discharged [facility] today  DISCHARGE CONDITIONS:   Guarded  CONSULTS OBTAINED:   None  DRUG ALLERGIES:   Allergies  Allergen Reactions  . Erythromycin Ethylsuccinate Other (See Comments)    REACTION:  unknown  . Mycinette [Phenol] Other (See Comments)    REACTION:  unknown    DISCHARGE MEDICATIONS:   Current Discharge Medication List    START taking these medications   Details  acetaminophen (TYLENOL) 325 MG tablet Take 2 tablets (650 mg total) by mouth every 6 (six) hours as needed for mild pain (or Fever >/= 101). Qty: 30 tablet, Refills: 0    carvedilol (COREG) 3.125 MG tablet Take 1 tablet (3.125 mg total) by mouth 2 (two) times daily with a meal. Qty: 60 tablet, Refills:  2    cephALEXin (KEFLEX) 250 MG capsule Take 1 capsule (250 mg total) by mouth every 8 (eight) hours. Qty: 9 capsule, Refills: 0    HYDROcodone-acetaminophen (NORCO/VICODIN) 5-325 MG tablet Take 1 tablet by mouth every 6 (six) hours as needed for moderate pain. Qty: 20 tablet, Refills: 0      CONTINUE these medications which have CHANGED   Details  ALPRAZolam (XANAX) 0.25 MG tablet Take 1 tablet (0.25 mg total) by mouth 3 (three) times daily as needed for  anxiety. Qty: 20 tablet, Refills: 0    lisinopril (PRINIVIL,ZESTRIL) 5 MG tablet Take 1 tablet (5 mg total) by mouth daily. Qty: 30 tablet, Refills: 0      CONTINUE these medications which have NOT CHANGED   Details  apixaban (ELIQUIS) 2.5 MG TABS tablet Take 1 tablet (2.5 mg total) by mouth 2 (two) times daily. Qty: 60 tablet, Refills: 6    bacitracin-polymyxin b (POLYSPORIN) ophthalmic ointment Place 1 application into the right eye 2 (two) times daily. apply to eye every 12 hours while awake Qty: 3.5 g, Refills: 0    citalopram (CELEXA) 20 MG tablet Take 1 tablet (20 mg total) by mouth daily. Qty: 30 tablet, Refills: 1    docusate sodium (COLACE) 100 MG capsule Take 100 mg by mouth at bedtime as needed for mild constipation.    furosemide (LASIX) 40 MG tablet Take 1 tablet (40 mg total) by mouth daily. Qty: 30 tablet, Refills: 3    levothyroxine (SYNTHROID, LEVOTHROID) 25 MCG tablet Take 1 tablet (25 mcg total) by mouth daily before breakfast. Qty: 30 tablet, Refills: 1    lidocaine (LIDODERM) 5 % Place 1 patch onto the skin daily. Remove & Discard patch within 12 hours or as directed by MD Qty: 90 patch, Refills: 3    mineral oil-hydrophilic petrolatum (AQUAPHOR) ointment Apply 1 application topically daily. Apply to feet/lower legs    potassium chloride (MICRO-K) 10 MEQ CR capsule Take 1 capsule (10 mEq total) by mouth daily. Qty: 30 capsule, Refills: 3      STOP taking these medications     metoprolol tartrate (LOPRESSOR) 25 MG tablet          DISCHARGE INSTRUCTIONS:   1. PCP follow up in 1 week 2. Cardiology f/u in 2 weeks 3. Physical Therapy 4. Use ACE wraps or TEDS to her legs every other day as tolerated  If you experience worsening of your admission symptoms, develop shortness of breath, life threatening emergency, suicidal or homicidal thoughts you must seek medical attention immediately by calling 911 or calling your MD immediately  if symptoms less  severe.  You Must read complete instructions/literature along with all the possible adverse reactions/side effects for all the Medicines you take and that have been prescribed to you. Take any new Medicines after you have completely understood and accept all the possible adverse reactions/side effects.   Please note  You were cared for by a hospitalist during your hospital stay. If you have any questions about your discharge medications or the care you received while you were in the hospital after you are discharged, you can call the unit and asked to speak with the hospitalist on call if the hospitalist that took care of you is not available. Once you are discharged, your primary care physician will handle any further medical issues. Please note that NO REFILLS for any discharge medications will be authorized once you are discharged, as it is imperative that you return to your  primary care physician (or establish a relationship with a primary care physician if you do not have one) for your aftercare needs so that they can reassess your need for medications and monitor your lab values.    Today   CHIEF COMPLAINT:   Chief Complaint  Patient presents with  . Leg Swelling  . Altered Mental Status    VITAL SIGNS:  Blood pressure 105/70, pulse 97, temperature 97.4 F (36.3 C), temperature source Oral, resp. rate 18, height 5\' 2"  (1.575 m), weight 68.584 kg (151 lb 3.2 oz), SpO2 98 %.  I/O:   Intake/Output Summary (Last 24 hours) at 02/05/15 1101 Last data filed at 02/05/15 0720  Gross per 24 hour  Intake    240 ml  Output    150 ml  Net     90 ml    PHYSICAL EXAMINATION:   Physical Exam  GENERAL: 81 y.o.-year-old patient lying in the bed with no acute distress. Generalized weakness noted. EYES: Pupils equal, round, reactive to light and accommodation. No scleral icterus. Extraocular muscles intact.  HEENT: Head atraumatic, normocephalic. Oropharynx and nasopharynx clear.   NECK: Supple, no jugular venous distention. No thyroid enlargement, no tenderness.  LUNGS: Normal breath sounds bilaterally, decreased bibasilar breath sounds. no wheezing,rhonchi. Scattered rales occasionally. No use of accessory muscles of respiration.  CARDIOVASCULAR: S1, S2 normal. No rubs, or gallops. 3/6 systolic murmur heard. ABDOMEN: Soft, nontender, nondistended. Bowel sounds present. No organomegaly or mass.  EXTREMITIES: 1+ edema mostly above knees, up to the thighs. There's a superficial abrasion on the dorsal side of the left ankle.Marland Kitchen No cyanosis, or clubbing.  NEUROLOGIC: Cranial nerves II through XII are intact. Muscle strength 5/5 in all extremities. Sensation intact. Gait not checked. Generalized weakness. PSYCHIATRIC: The patient is alert and oriented x 3.  SKIN: No obvious rash, lesion, or ulcer.  DATA REVIEW:   CBC  Recent Labs Lab 02/03/15 0441  WBC 9.6  HGB 11.9*  HCT 37.8  PLT 206    Chemistries   Recent Labs Lab 02/05/15 0513  NA 134*  K 4.7  CL 101  CO2 25  GLUCOSE 123*  BUN 44*  CREATININE 1.14*  CALCIUM 9.0    Cardiac Enzymes  Recent Labs Lab 02/02/15 0142  TROPONINI 0.14*    Microbiology Results  Results for orders placed or performed during the hospital encounter of 09/07/14  Wound culture     Status: None   Collection Time: 09/07/14  4:47 PM  Result Value Ref Range Status   Specimen Description ABSCESS  Final   Special Requests NONE  Final   Gram Stain   Final    RARE WBC SEEN FEW GRAM NEGATIVE RODS FEW GRAM POSITIVE COCCI    Culture   Final    HEAVY GROWTH STAPHYLOCOCCUS AUREUS CRITICAL RESULT CALLED TO, READ BACK BY AND VERIFIED WITH: KIM WOODY 09/10/14 1121 JGF CEFOXITIN SCREEN - This test may be used to predict mecA-mediated oxacillin resistance, and it is based on the cefoxitin disk screen test.  The cefoxitin screen and oxacillin work in combination to determine the final interpretation reported for  oxacillin.       Report Status 09/10/2014 FINAL  Final   Organism ID, Bacteria STAPHYLOCOCCUS AUREUS  Final      Susceptibility   Staphylococcus aureus - MIC*    CIPROFLOXACIN <=0.5 SENSITIVE Sensitive     GENTAMICIN <=0.5 SENSITIVE Sensitive     OXACILLIN >=4 RESISTANT Resistant     TRIMETH/SULFA <=10 SENSITIVE Sensitive  CEFOXITIN SCREEN Value in next row Resistant      POSITIVECEFOXITIN SCREEN - This test may be used to predict mecA-mediated oxacillin resistance, and it is based on the cefoxitin disk screen test.  The cefoxitin screen and oxacillin work in combination to determine the final interpretation reported for oxacillin.     Inducible Clindamycin Value in next row Sensitive      POSITIVECEFOXITIN SCREEN - This test may be used to predict mecA-mediated oxacillin resistance, and it is based on the cefoxitin disk screen test.  The cefoxitin screen and oxacillin work in combination to determine the final interpretation reported for oxacillin.     * HEAVY GROWTH STAPHYLOCOCCUS AUREUS    RADIOLOGY:  No results found.  EKG:   Orders placed or performed during the hospital encounter of 02/01/15  . ED EKG  . ED EKG      Management plans discussed with the patient, family and they are in agreement.  CODE STATUS:     Code Status Orders        Start     Ordered   02/01/15 1315  Full code   Continuous     02/01/15 1314    Advance Directive Documentation        Most Recent Value   Type of Advance Directive  Healthcare Power of Attorney   Pre-existing out of facility DNR order (yellow form or pink MOST form)     "MOST" Form in Place?        TOTAL TIME TAKING CARE OF THIS PATIENT: 37 minutes.    Gladstone Lighter M.D on 02/05/2015 at 11:01 AM  Between 7am to 6pm - Pager - [phone]  After 6pm go to [email or web address] - password [email or web address] [place] [facility]  Office  [phone]  CC: Primary care physician; Park Liter, DO

## 2015-02-05 NOTE — Progress Notes (Signed)
Report given to receiving RN, Kennyth Lose. Patient's belongings packed and chart with patient. Awaiting orderly to transport patient to new room.

## 2015-02-05 NOTE — Progress Notes (Signed)
Patient's daughter, [name], notified via telephone of patient being transferred to new room, 131. [name] verbalized understanding and repeated room number back.

## 2015-02-05 NOTE — Progress Notes (Signed)
[place] discharged to [facility] per MD order. VSS. Report given to [facility] at facility. Will call EMS for transport.

## 2015-02-05 NOTE — Clinical Social Work Note (Signed)
Pt is ready for discharge today to [facility]. CSW updated pt's daughter, [name]. Facility has received discharge information and is ready to admit pt. RN called report and EMS will provide transportation to facility. CSW is signing off as no further needs identified.   Darden Dates, MSW, LCSW Clinical Social Worker [phone]

## 2015-02-14 ENCOUNTER — Ambulatory Visit: Payer: Medicare Other | Admitting: General Surgery

## 2015-02-19 ENCOUNTER — Ambulatory Visit: Payer: Medicare Other | Admitting: Family Medicine

## 2015-02-25 ENCOUNTER — Other Ambulatory Visit: Payer: Self-pay | Admitting: Family Medicine

## 2015-02-28 ENCOUNTER — Ambulatory Visit: Payer: Medicare Other | Admitting: Family

## 2015-03-02 ENCOUNTER — Telehealth: Payer: Self-pay | Admitting: Unknown Physician Specialty

## 2015-03-02 ENCOUNTER — Emergency Department
Admission: EM | Admit: 2015-03-02 | Discharge: 2015-03-20 | Disposition: E | Payer: Medicare Other | Attending: Emergency Medicine | Admitting: Emergency Medicine

## 2015-03-02 DIAGNOSIS — Z79899 Other long term (current) drug therapy: Secondary | ICD-10-CM | POA: Insufficient documentation

## 2015-03-02 DIAGNOSIS — Z792 Long term (current) use of antibiotics: Secondary | ICD-10-CM | POA: Insufficient documentation

## 2015-03-02 DIAGNOSIS — I10 Essential (primary) hypertension: Secondary | ICD-10-CM | POA: Diagnosis not present

## 2015-03-02 DIAGNOSIS — I469 Cardiac arrest, cause unspecified: Secondary | ICD-10-CM | POA: Insufficient documentation

## 2015-03-02 DIAGNOSIS — E119 Type 2 diabetes mellitus without complications: Secondary | ICD-10-CM | POA: Insufficient documentation

## 2015-03-02 MED ORDER — EPINEPHRINE HCL 0.1 MG/ML IJ SOSY
PREFILLED_SYRINGE | INTRAMUSCULAR | Status: AC | PRN
  Administered 2015-03-02: 1 via INTRAVENOUS

## 2015-03-02 MED FILL — Medication: Qty: 1 | Status: AC

## 2015-03-04 NOTE — Telephone Encounter (Signed)
Called patient's daughter to express our condolences. If there is anything she needs from [place], she will call. LMOM. Death certificate filled out.

## 2015-03-13 ENCOUNTER — Ambulatory Visit: Payer: Medicare Other | Admitting: Family

## 2015-03-20 NOTE — Code Documentation (Signed)
EMS arrived with 82 y.o. Female in cardiac arrest. EMS reports that pt was conversant with staff prior to arrest. Pt was shocked by fire department x1 and then EMS x2 with 4 vials of EPI. At time of EMS transport, pt had been down approx. 25 minutes. Per EMS pt then converted to atrial fibrillation. [place] airway is present as well as two peripheral IV's.

## 2015-03-20 NOTE — ED Notes (Signed)
Pt presents via EMS in cardiac arrest. Per EMS pt was shocked three times, given four rounds of epi, with ROSC. Pt is unresponsive at this time.Marland Kitchen

## 2015-03-20 NOTE — ED Notes (Signed)
[place] donor services contacted. Pt not a candidate for organ donation. referal number [phone], coordinator [facility].

## 2015-03-20 NOTE — Telephone Encounter (Signed)
Received a call from the ER that patient arrived in cardiac arrest with unsuccessful resuscitation.  I affirmed we will sign the death certificate.

## 2015-03-20 NOTE — ED Notes (Signed)
Funeral home arrangements per family made with [employer] ([address] [place], [place] [address], [phone]).

## 2015-03-20 NOTE — Code Documentation (Signed)
Organ procurement team notified.

## 2015-03-20 NOTE — Code Documentation (Signed)
Family updated as to patient's status.

## 2015-03-20 NOTE — Code Documentation (Signed)
Patient time of death occurred at 69.

## 2015-03-20 NOTE — ED Notes (Signed)
Pt transported to morgue by house orderly.

## 2015-03-20 NOTE — ED Notes (Signed)
Family at bedside with chaplain  

## 2015-03-20 NOTE — Code Documentation (Addendum)
No pulse detected by staff. Pt is in an agonal rhythm at this time.

## 2015-03-20 NOTE — ED Provider Notes (Signed)
Dr. Wynetta Emery will be signing the death certificate for the patient.  Earleen Newport, MD Mar 10, 2015 [phone]

## 2015-03-20 NOTE — ED Notes (Signed)
Lines/tubes ok to be removed per MD. House orderly enroute to transport pt to morgue.

## 2015-03-20 NOTE — Code Documentation (Signed)
[place] pads are on pt. One round vial of epi given and CPR resumed.

## 2015-03-20 NOTE — ED Provider Notes (Addendum)
[facility] Emergency Department Provider Note  ____________________________________________   I have reviewed the triage vital signs and the nursing notes.   HISTORY  Chief Complaint Cardiac Arrest    HPI [name] is a 82 y.o. female presents today as a resuscitated code. According to EMS, patient was at the nursing facility and they talk to her 15 minutes before rechecking upon her. At that time, she was found to be pulseless. Firemen were the first on the scene and they did administer shock, she received other shocks as well as 3 rounds of epi. Patient was pulseless for over 25 minutes according to EMS, and that was after emergency responders arrived.. They did then managed to get the patient into a atrial fibrillation rhythm with anterior ST elevation with pulses prior to arriving here. However, upon arrival patient was in PEA. Patient is on Eliquis and there is significant bleeding from the tube. King airway was in place.  Past Medical History  Diagnosis Date  . Type II or unspecified type diabetes mellitus without mention of complication, not stated as uncontrolled   . Essential hypertension, benign   . Anemia, unspecified   . Atrial fibrillation ([place])   . Hypercholesterolemia   . Osteoarthritis   . History of TIAs   . CVA (cerebral infarction)   . Atrial fibrillation ([place])   . Uterine cancer ([facility])     s/p TAH/BSO  . CAD (coronary artery disease)     s/p CABG 10/99 with LIMA to the LAD, SVG to OM1, SVG to distal RCA  . Cellulitis   . Lymphedema   . Chronic venous insufficiency     Patient Active Problem List   Diagnosis Date Noted  . Pressure ulcer 02/03/2015  . CHF (congestive heart failure) ([place]) 02/01/2015  . Hypothyroidism 12/03/2014  . Elevated TSH 12/03/2014  . Thrombocytopenia ([place]) 11/30/2014  . Cellulitis 08/14/2014  . Cellulitis of left leg 08/13/2014  . Rash 07/02/2014  . Edema 01/28/2014  . Toenail fungus 06/25/2013  .  CVA (cerebral vascular accident) ([place]) 04/02/2013  . ICH (intracerebral hemorrhage) on anticoagulation, after recent LPCA infarct 01/26/2013  . Neuropathy ([place]) 12/18/2012  . Hyponatremia 06/19/2012  . Atrial fibrillation ([place]) 06/17/2012  . Essential hypertension, benign 06/17/2012  . CAD (coronary artery disease) 06/17/2012  . Peripheral vascular disease ([place]) 06/17/2012  . Diabetes ([place]) 06/17/2012  . Hypercholesterolemia 06/17/2012  . Depression 06/17/2012    Past Surgical History  Procedure Laterality Date  . Total abdominal hysterectomy w/ bilateral salpingoophorectomy  1969  . Coronary artery bypass graft  1999    x4  . Cataract extraction      Right eye  . Peripheral vascular catheterization N/A 09/20/2014    Procedure: Lower Extremity Angiography;  Surgeon: Algernon Huxley, MD;  Location: [facility];  Service: Cardiovascular;  Laterality: N/A;  . Peripheral vascular catheterization  09/20/2014    Procedure: Lower Extremity Intervention;  Surgeon: Algernon Huxley, MD;  Location: [facility];  Service: Cardiovascular;;    Current Outpatient Rx  Name  Route  Sig  Dispense  Refill  . acetaminophen (TYLENOL) 325 MG tablet   Oral   Take 2 tablets (650 mg total) by mouth every 6 (six) hours as needed for mild pain (or Fever >/= 101).   30 tablet   0   . ALPRAZolam (XANAX) 0.25 MG tablet   Oral   Take 1 tablet (0.25 mg total) by mouth 3 (three) times daily  as needed for anxiety.   20 tablet   0   . apixaban (ELIQUIS) 2.5 MG TABS tablet   Oral   Take 1 tablet (2.5 mg total) by mouth 2 (two) times daily.   60 tablet   6   . bacitracin-polymyxin b (POLYSPORIN) ophthalmic ointment   Right Eye   Place 1 application into the right eye 2 (two) times daily. apply to eye every 12 hours while awake   3.5 g   0   . carvedilol (COREG) 3.125 MG tablet   Oral   Take 1 tablet (3.125 mg total) by mouth 2 (two) times daily with a meal.   60 tablet   2   . citalopram  (CELEXA) 20 MG tablet      TAKE 1 TABLET (20 MG TOTAL) BY MOUTH DAILY.   30 tablet   1   . docusate sodium (COLACE) 100 MG capsule   Oral   Take 100 mg by mouth at bedtime as needed for mild constipation.         . furosemide (LASIX) 40 MG tablet   Oral   Take 1 tablet (40 mg total) by mouth daily.   30 tablet   3   . HYDROcodone-acetaminophen (NORCO/VICODIN) 5-325 MG tablet   Oral   Take 1 tablet by mouth every 6 (six) hours as needed for moderate pain.   20 tablet   0   . levothyroxine (SYNTHROID, LEVOTHROID) 25 MCG tablet   Oral   Take 1 tablet (25 mcg total) by mouth daily before breakfast.   30 tablet   1   . lidocaine (LIDODERM) 5 %   Transdermal   Place 1 patch onto the skin daily. Remove & Discard patch within 12 hours or as directed by MD   90 patch   3   . lisinopril (PRINIVIL,ZESTRIL) 5 MG tablet   Oral   Take 1 tablet (5 mg total) by mouth daily.   30 tablet   0   . mineral oil-hydrophilic petrolatum (AQUAPHOR) ointment   Topical   Apply 1 application topically daily. Apply to feet/lower legs         . potassium chloride (MICRO-K) 10 MEQ CR capsule   Oral   Take 1 capsule (10 mEq total) by mouth daily.   30 capsule   3     Allergies Erythromycin ethylsuccinate and Mycinette  Family History  Problem Relation Age of Onset  . Heart disease Mother 54    Died of heart attack  . Stroke Father 62    died of cva  . CAD Brother     multiple brothers    Social History Social History  Substance Use Topics  . Smoking status: Never Smoker   . Smokeless tobacco: Never Used  . Alcohol Use: No    Review of Systems Cannot obtain second the patient's status ____________________________________________   PHYSICAL EXAM:  VITAL SIGNS: ED Triage Vitals  Enc Vitals Group     BP 05-Mar-2015 0625 160/107 mmHg     Pulse Rate 03-05-2015 0625 142     Resp Mar 05, 2015 0625 18     Temp --      Temp src --      SpO2 03-05-2015 0625 100 %     Weight --       Height --      Head Cir --      Peak Flow --      Pain Score --  Pain Loc --      Pain Edu? --      Excl. in [place]? --     Constitutional: Apnea noted, patient is pulseless Eyes: Conjunctivae are normal. Pupils nonreactive. EOMI. Head: Atraumatic. Mouth/Throat: King airway in place with bleeding noted in the tube. Neck: No stridor.  Cardiovascular: No heart beat auscultated, ultrasound shows no evidence traction's or organized cardiac activity Respiratory: Bagged respirations no evidence of pneumothorax Abdominal: Soft Musculoskeletal: No significant edema Neurologic:  Cardiac arrest   ____________________________________________   LABS (all labs ordered are listed, but only abnormal results are displayed)  Labs Reviewed - No data to display ____________________________________________  EKG  I personally interpreted any EKGs ordered by me or triage No EKGs were obtained initially on this patient, EKG from EMS shows what is likely a atrial fibrillation with ST elevation noted to v 34 and 5 this is EMS ecg ____________________________________________  RADIOLOGY  I reviewed any imaging ordered by me or triage that were performed during my shift ____________________________________________   PROCEDURES  Procedure(s) performed: None  Critical Care performed: None  ____________________________________________   INITIAL IMPRESSION / ASSESSMENT AND PLAN / ED COURSE  Pertinent labs & imaging results that were available during my care of the patient were reviewed by me and considered in my medical decision making (see chart for details).  Patient was in PEA when she arrived it is unclear how long she was in PEA immediately prior to arrival. At one point, EMS did have pulses. We did attempt resuscitation here but by that time patient had been down for a considerable amount of time, ultrasound at the bedside showed absolute no evidence of contractility to the heart,  she did have significant bleeding through the [facility] airway. We did give appendectomy and attempted shock of what may have been a fine V. fib, we're unable to get pulses back and medical futility was apparent at that time. The patient was declared dead and I have let the family know.  ----------------------------------------- 7:22 AM on 03/07/2015 -----------------------------------------  Elmyra Ricks examiner, Izora Ribas, grease this is not an me case. Time of death was 37. ____________________________________________   FINAL CLINICAL IMPRESSION(S) / ED DIAGNOSES  Final diagnoses:  None    Schuyler Amor, MD 03/07/15 [place], MD March 07, 2015 [phone]

## 2015-03-20 NOTE — ED Notes (Signed)
Pt arrives via ems, No palpable pulse felt, pt moved from ems strecher, compressions started by ed staff.

## 2015-03-20 DEATH — deceased

## 2015-08-02 NOTE — Telephone Encounter (Signed)
error 

## 2016-02-19 IMAGING — US US EXTREM LOW VENOUS*L*
1 series · 13 of 24 positions shown · non-contrast
Comparison: Bilateral lower extremity duplex 01/03/2014

CLINICAL DATA: Left lower extremity pain and swelling for 2 days.



[Series 1: us extrem low venous*left* · 0.07mm/px · 13 of 37 slices shown]
[im 1/37]
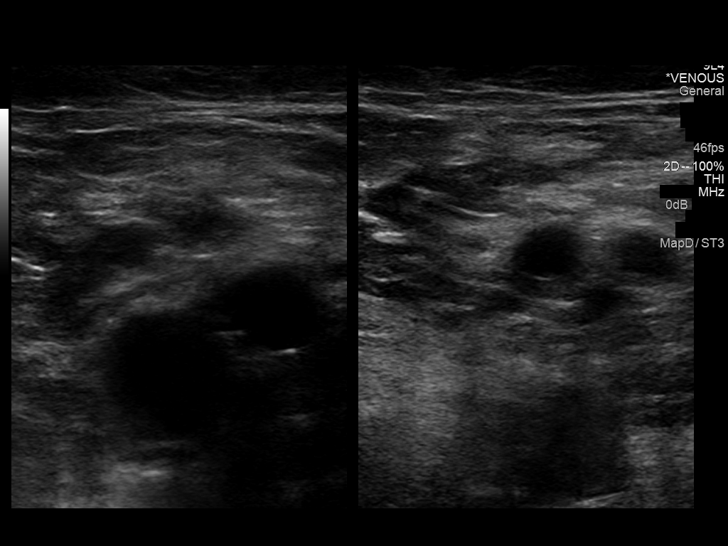
[im 4/37]
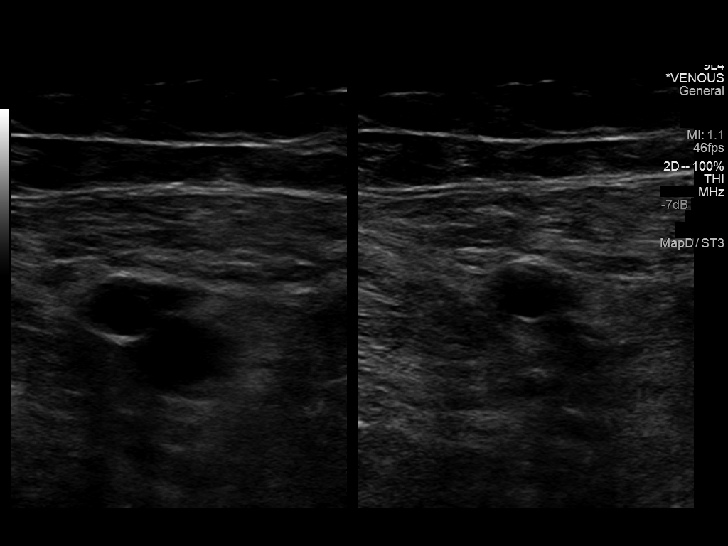
[im 7/37]
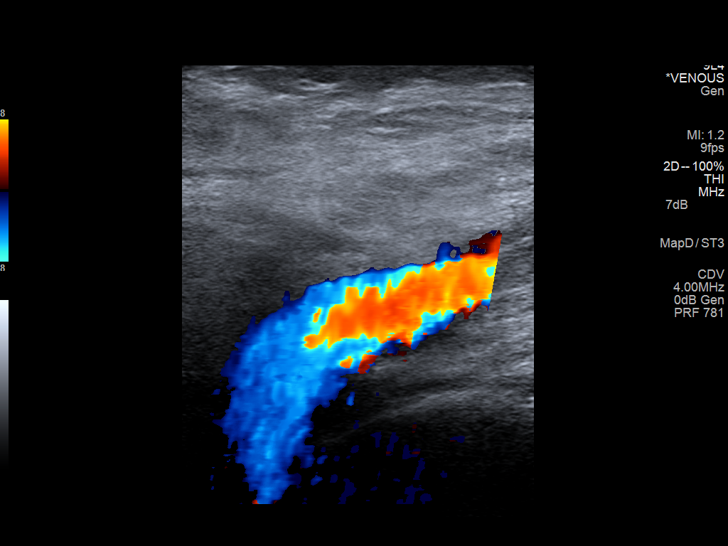
[im 10/37]
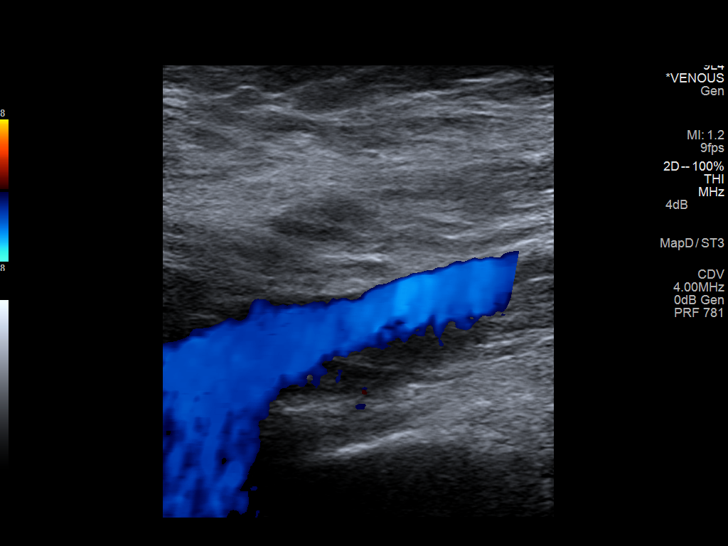
[im 13/37]
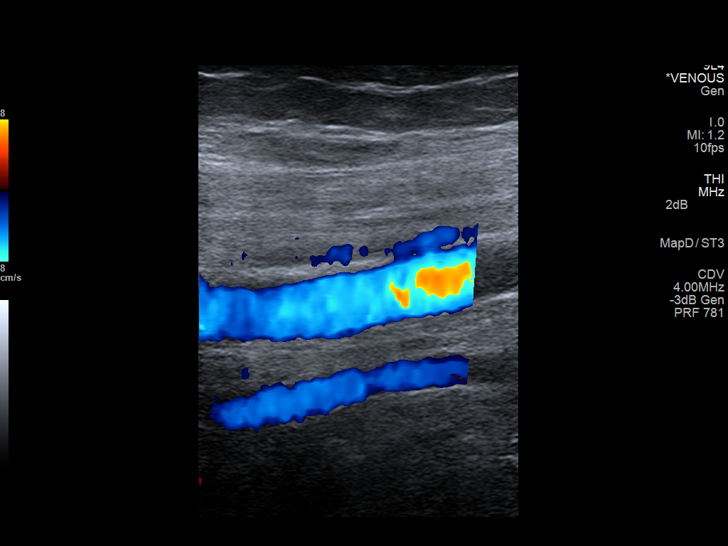
[im 16/37]
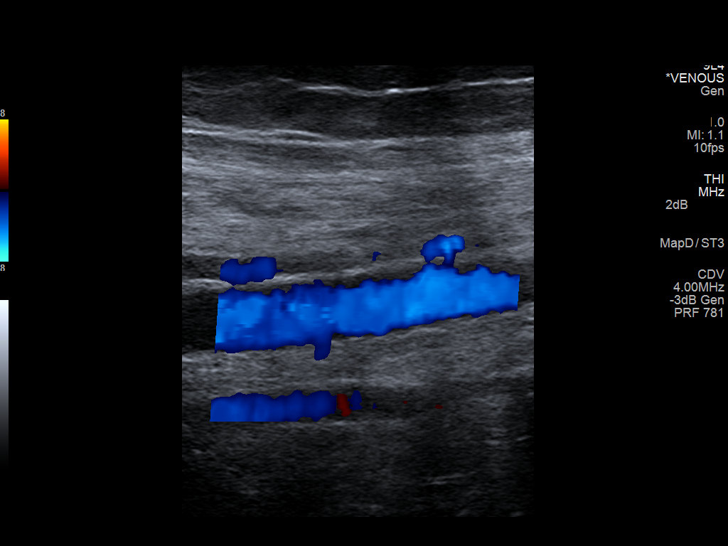
[im 19/37]
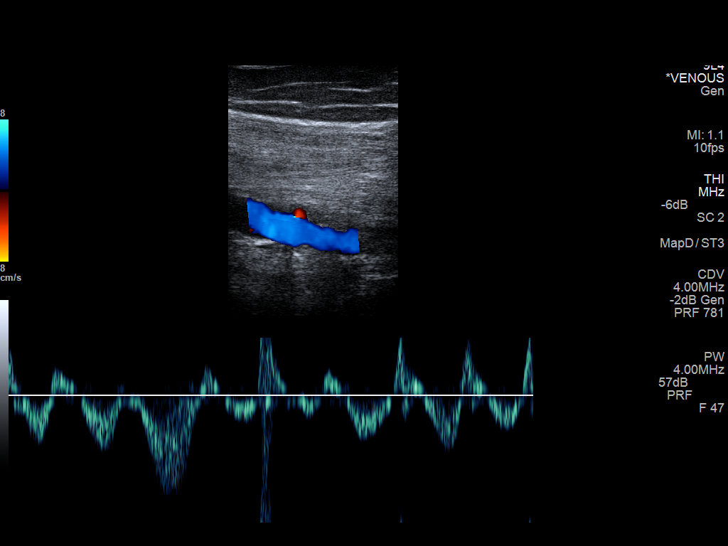
[im 21/37]
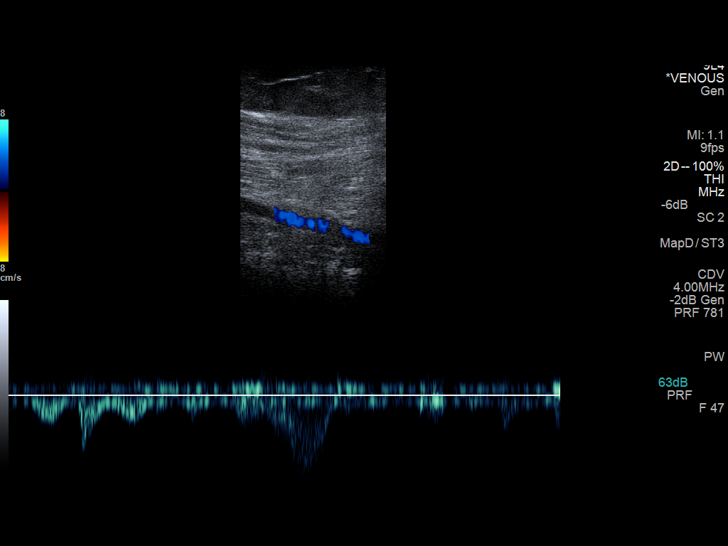
[im 24/37]
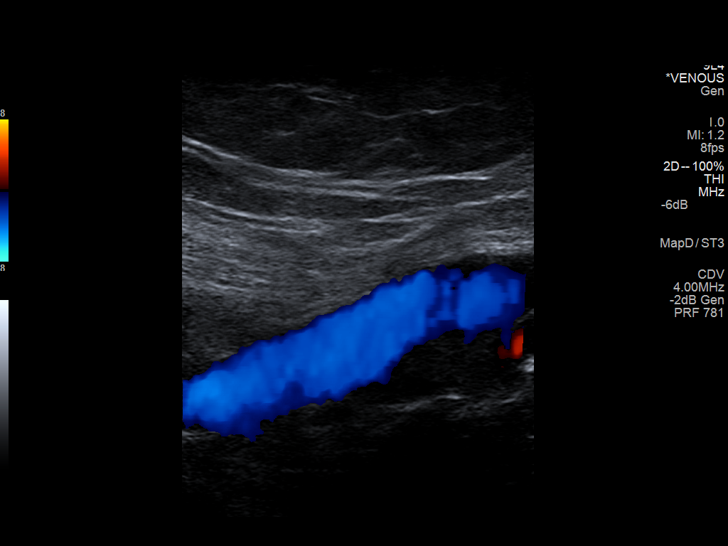
[im 27/37]
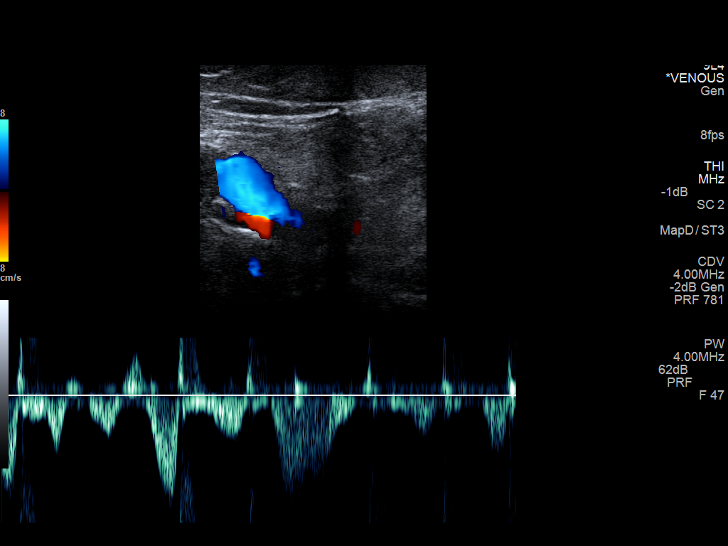
[im 30/37]
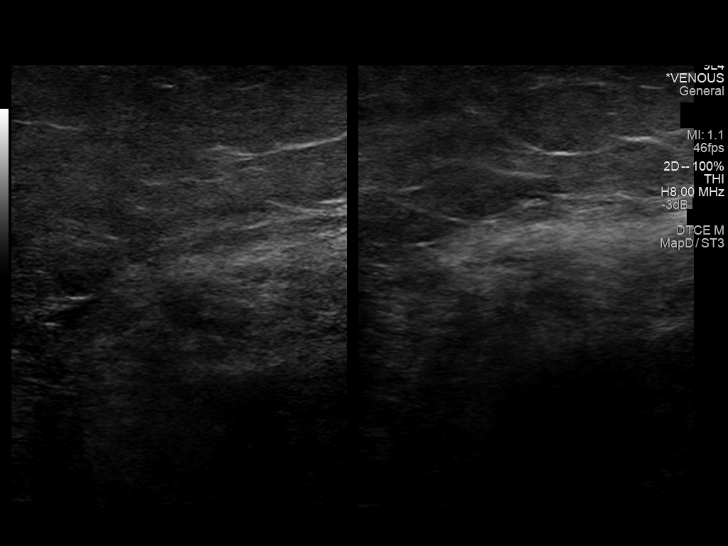
[im 33/37]
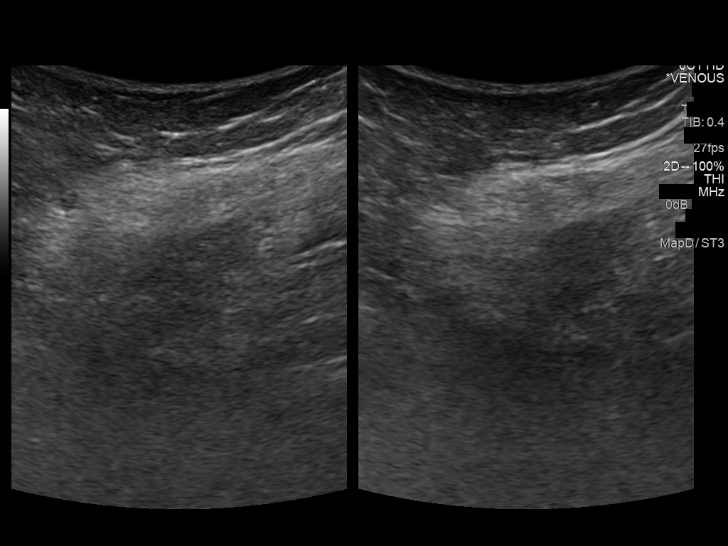
[im 37/37]
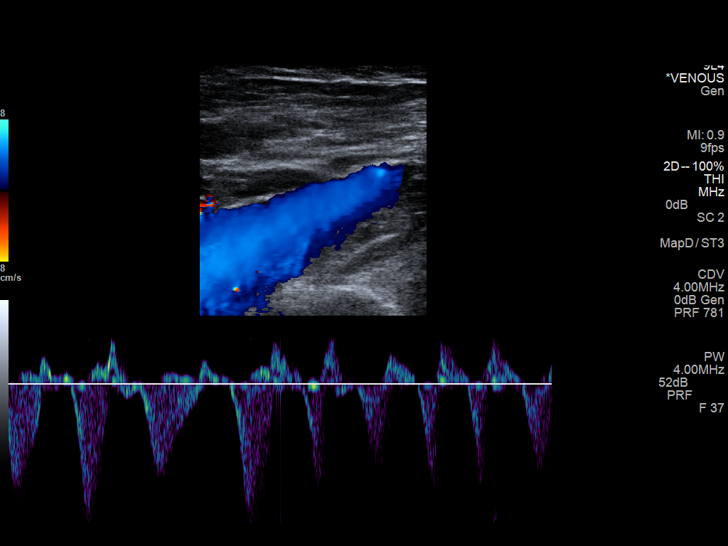

[13 of 24 positions shown; findings below may reference images not displayed]

FINDINGS: Contralateral Common Femoral Vein: Respiratory phasicity is normal
and symmetric with the symptomatic side. No evidence of thrombus.
Normal compressibility.

Common Femoral Vein: No evidence of thrombus. Normal
compressibility, respiratory phasicity and response to augmentation.

Saphenofemoral Junction: No evidence of thrombus. Normal
compressibility and flow on color Doppler imaging.

Profunda Femoral Vein: No evidence of thrombus. Normal
compressibility and flow on color Doppler imaging.

Femoral Vein: No evidence of thrombus. Normal compressibility,
respiratory phasicity and response to augmentation.

Popliteal Vein: No evidence of thrombus. Normal compressibility,
respiratory phasicity and response to augmentation.

Calf Veins: No evidence of thrombus. Normal flow on color Doppler
imaging. Technically limited evaluation, however blood flow seen.

Superficial Great Saphenous Vein: No evidence of thrombus. Normal
compressibility and flow on color Doppler imaging.

Venous Reflux:  None.

Other Findings:  None.
IMPRESSION: No evidence of deep venous thrombosis.

## 2017-02-03 IMAGING — CT CT HEAD W/O CM
1 series · 16 of 27 positions shown, 20 images · non-contrast
Comparison: 01/31/2013

CLINICAL DATA: Increasing confusion of the last 2 weeks.  Weakness.

EXAM:
CT HEAD WITHOUT CONTRAST
TECHNIQUE: Contiguous axial images were obtained from the base of the skull
through the vertex without intravenous contrast.

[Series 2: soft tissue · axial · 0.41mm/px · z∈[+152,+272]mm · 16 of 27 slices shown, 20 images]
[im 2/27  brain]
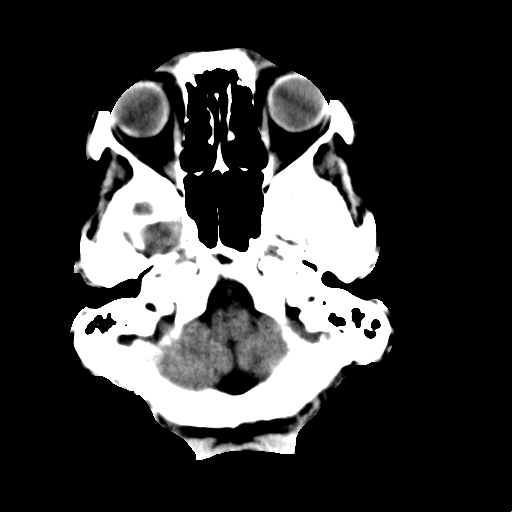
[im 2/27  bone]
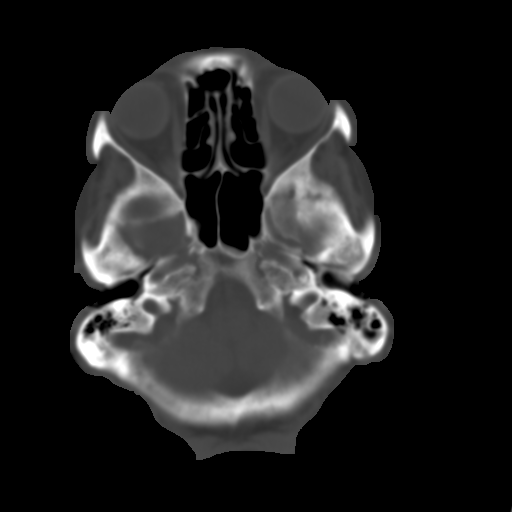
[im 4/27  brain]
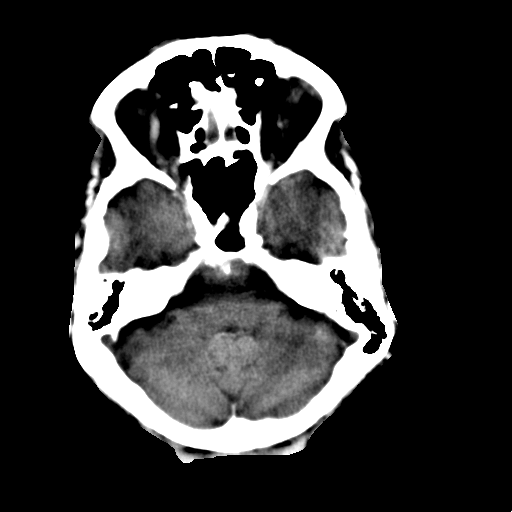
[im 5/27  brain]
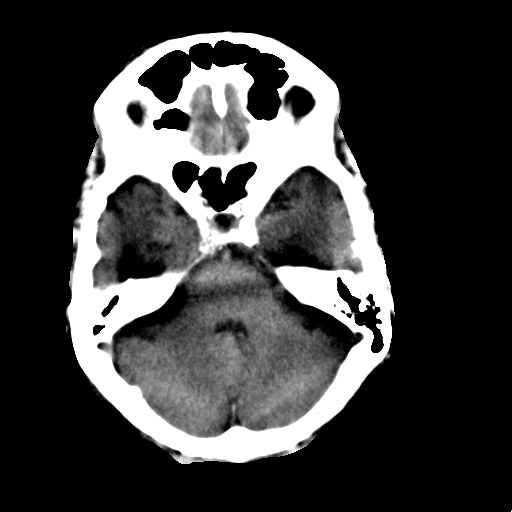
[im 7/27  brain]
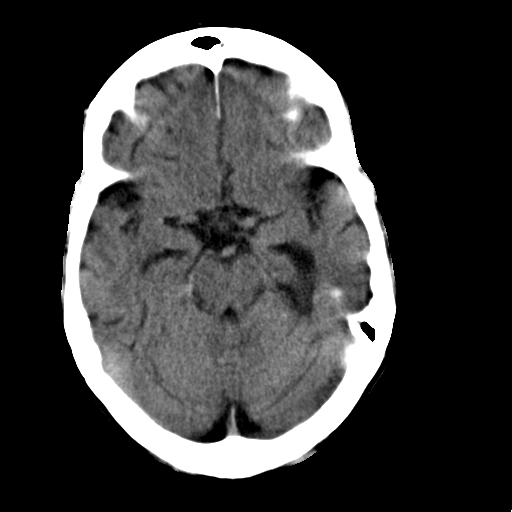
[im 9/27  brain]
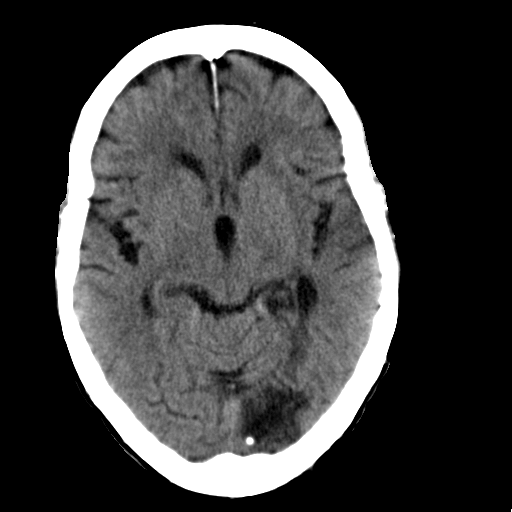
[im 9/27  bone]
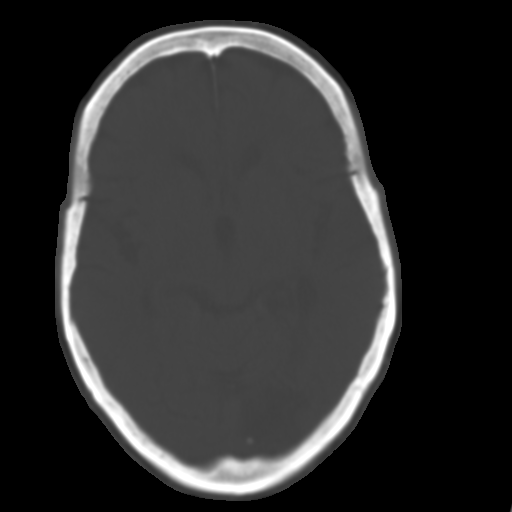
[im 10/27  brain]
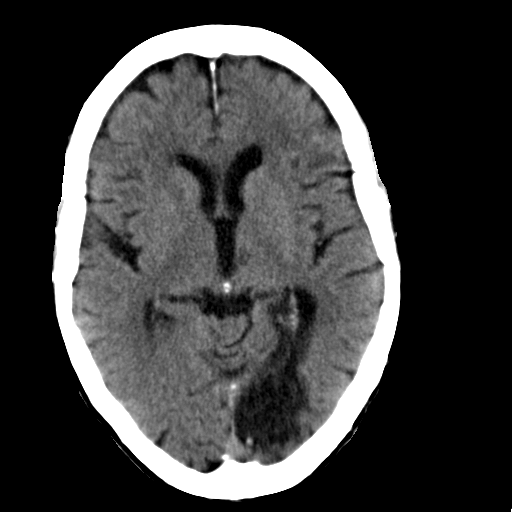
[im 12/27  brain]
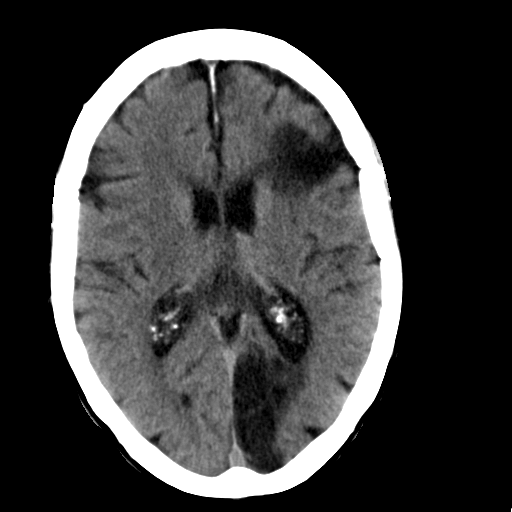
[im 13/27  brain]
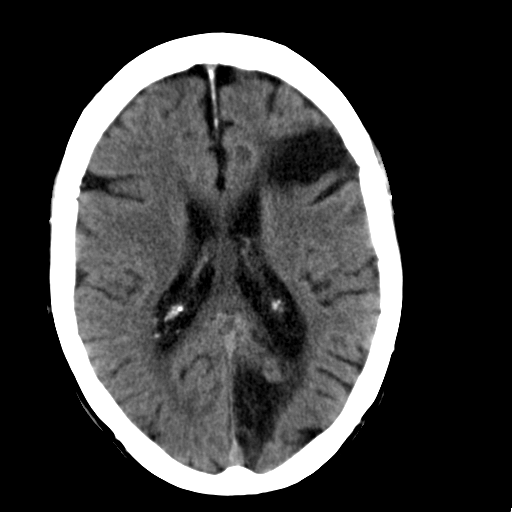
[im 15/27  brain]
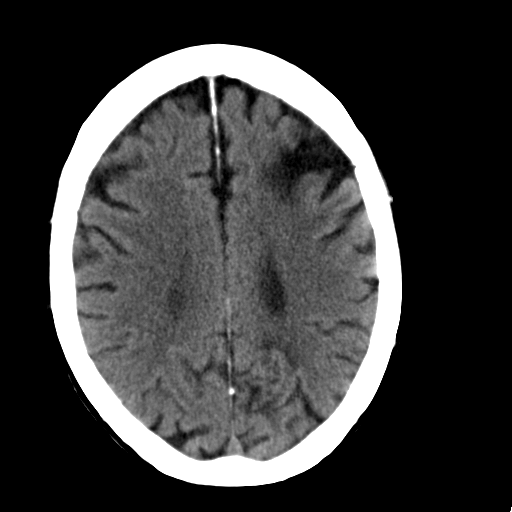
[im 15/27  bone]
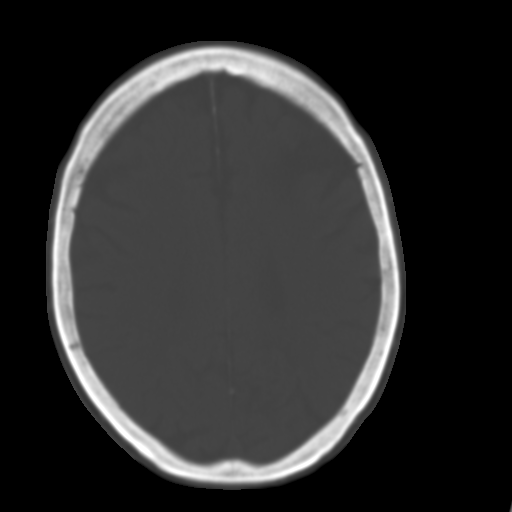
[im 16/27  brain]
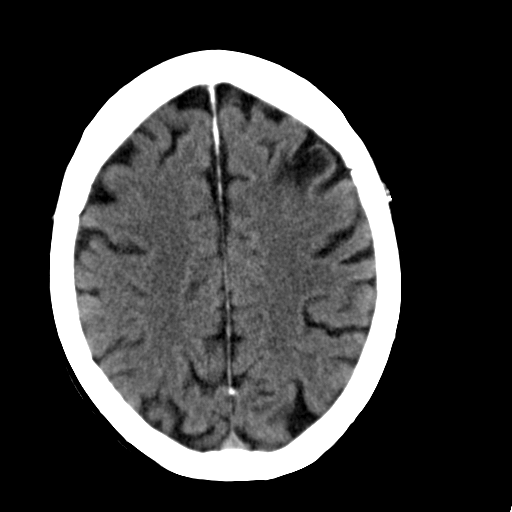
[im 18/27  brain]
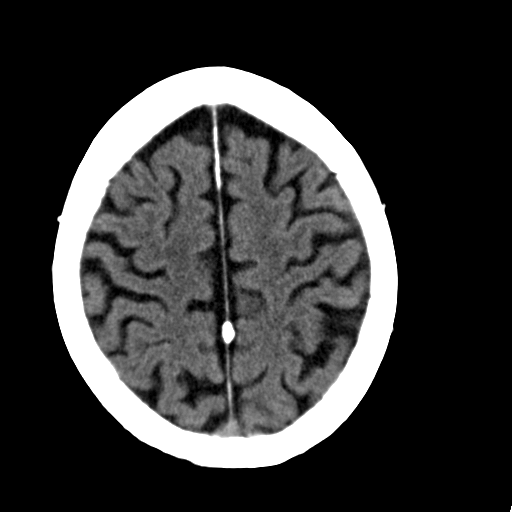
[im 19/27  brain]
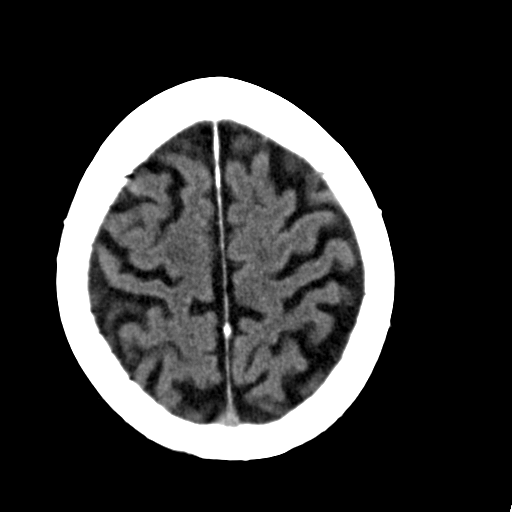
[im 21/27  brain]
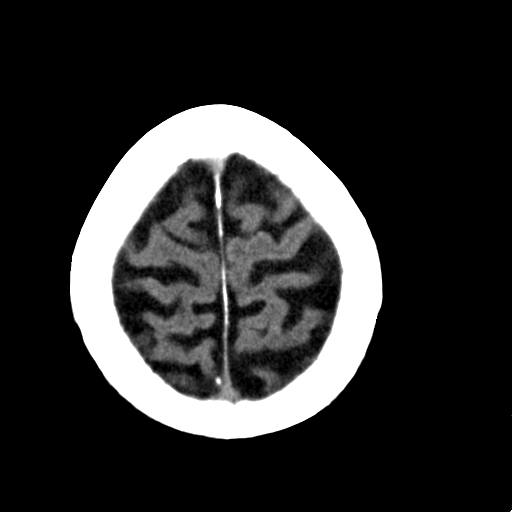
[im 21/27  bone]
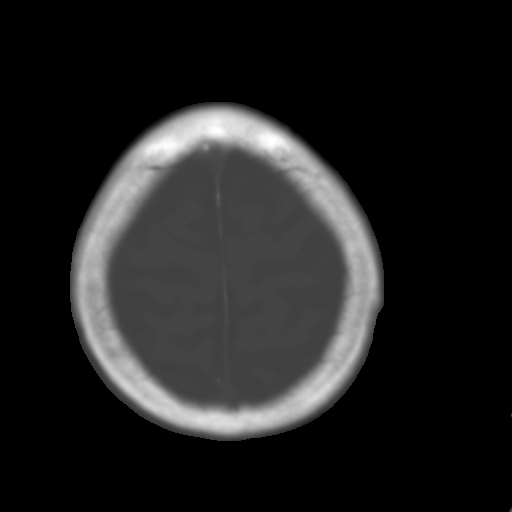
[im 23/27  brain]
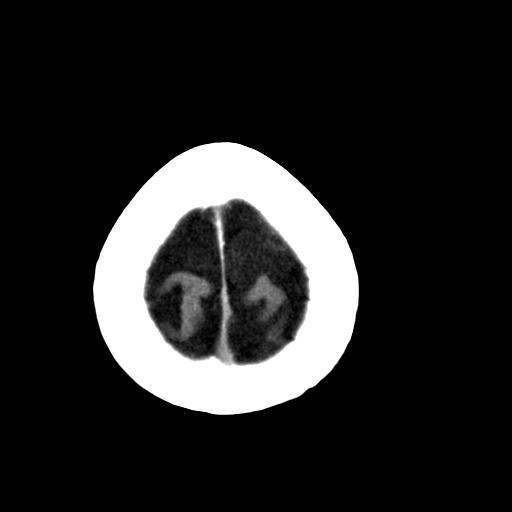
[im 24/27  brain]
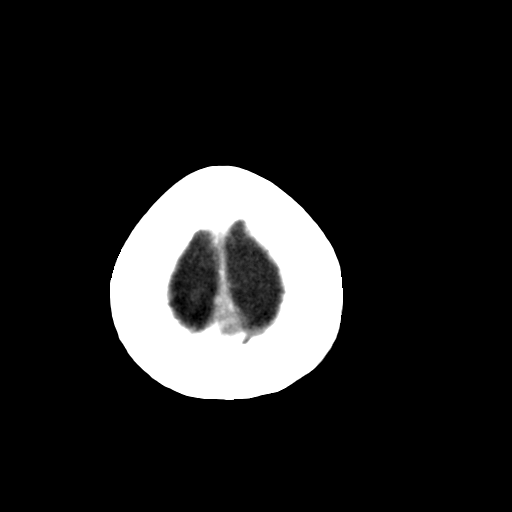
[im 26/27  brain]
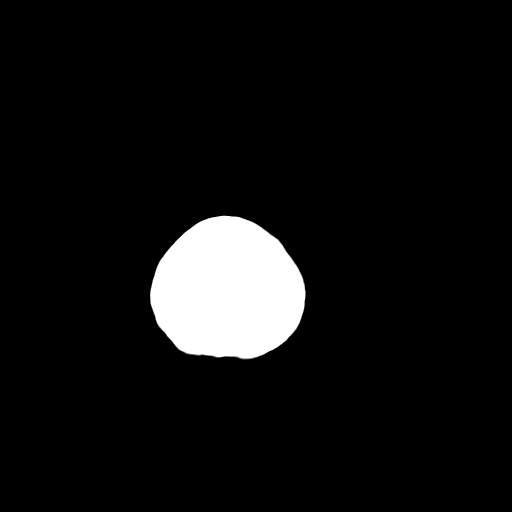

[16 of 27 positions shown; findings below may reference images not displayed]

FINDINGS: old infarcts and encephalomalacia in the left frontal lobe and left
occipital lobe. No acute infarction. No hemorrhage or hydrocephalus.
No acute calvarial abnormality.

Visualized paranasal sinuses and mastoids clear. Orbital soft
tissues unremarkable.
IMPRESSION: Old left frontal and occipital infarcts with encephalomalacia. No
acute intracranial abnormality.
